# Patient Record
Sex: Male | Born: 1935 | Race: White | Hispanic: No | Smoking: Current every day smoker
Health system: Southern US, Community
[De-identification: ages and names within clinical notes are randomized; demographics above are authoritative.]

## PROBLEM LIST (undated history)

## (undated) DIAGNOSIS — K219 Gastro-esophageal reflux disease without esophagitis: Secondary | ICD-10-CM

## (undated) DIAGNOSIS — I2510 Atherosclerotic heart disease of native coronary artery without angina pectoris: Secondary | ICD-10-CM

## (undated) DIAGNOSIS — E119 Type 2 diabetes mellitus without complications: Secondary | ICD-10-CM

## (undated) DIAGNOSIS — N400 Enlarged prostate without lower urinary tract symptoms: Secondary | ICD-10-CM

## (undated) DIAGNOSIS — R0600 Dyspnea, unspecified: Secondary | ICD-10-CM

## (undated) DIAGNOSIS — C801 Malignant (primary) neoplasm, unspecified: Secondary | ICD-10-CM

## (undated) DIAGNOSIS — J189 Pneumonia, unspecified organism: Secondary | ICD-10-CM

## (undated) DIAGNOSIS — G4730 Sleep apnea, unspecified: Secondary | ICD-10-CM

## (undated) DIAGNOSIS — D649 Anemia, unspecified: Secondary | ICD-10-CM

## (undated) DIAGNOSIS — J449 Chronic obstructive pulmonary disease, unspecified: Secondary | ICD-10-CM

## (undated) DIAGNOSIS — I10 Essential (primary) hypertension: Secondary | ICD-10-CM

## (undated) DIAGNOSIS — M1990 Unspecified osteoarthritis, unspecified site: Secondary | ICD-10-CM

## (undated) DIAGNOSIS — E7800 Pure hypercholesterolemia, unspecified: Secondary | ICD-10-CM

## (undated) DIAGNOSIS — H9190 Unspecified hearing loss, unspecified ear: Secondary | ICD-10-CM

## (undated) HISTORY — PX: UVULOPALATOPHARYNGOPLASTY: SHX827

## (undated) HISTORY — PX: COLONOSCOPY: SHX174

## (undated) HISTORY — PX: HERNIA REPAIR: SHX51

## (undated) HISTORY — PX: CARDIAC CATHETERIZATION: SHX172

## (undated) HISTORY — PX: EYE SURGERY: SHX253

## (undated) HISTORY — PX: COLON SURGERY: SHX602

## (undated) HISTORY — PX: TONSILLECTOMY: SUR1361

---

## 1998-09-10 DIAGNOSIS — I2510 Atherosclerotic heart disease of native coronary artery without angina pectoris: Secondary | ICD-10-CM | POA: Diagnosis present

## 2005-06-24 ENCOUNTER — Emergency Department: Payer: Self-pay | Admitting: Emergency Medicine

## 2005-06-24 ENCOUNTER — Other Ambulatory Visit: Payer: Self-pay

## 2007-03-27 ENCOUNTER — Ambulatory Visit: Payer: Self-pay | Admitting: Gastroenterology

## 2008-11-08 ENCOUNTER — Ambulatory Visit: Payer: Self-pay | Admitting: Family Medicine

## 2009-05-19 ENCOUNTER — Ambulatory Visit: Payer: Self-pay | Admitting: Gastroenterology

## 2011-06-30 ENCOUNTER — Inpatient Hospital Stay: Payer: Self-pay | Admitting: Surgery

## 2011-07-08 LAB — PATHOLOGY REPORT

## 2011-09-06 ENCOUNTER — Ambulatory Visit: Payer: Self-pay | Admitting: Unknown Physician Specialty

## 2011-09-09 LAB — PATHOLOGY REPORT

## 2012-07-05 ENCOUNTER — Ambulatory Visit: Payer: Self-pay | Admitting: Ophthalmology

## 2012-12-06 ENCOUNTER — Ambulatory Visit: Payer: Self-pay | Admitting: Ophthalmology

## 2014-04-22 DIAGNOSIS — I10 Essential (primary) hypertension: Secondary | ICD-10-CM | POA: Insufficient documentation

## 2014-04-22 DIAGNOSIS — E785 Hyperlipidemia, unspecified: Secondary | ICD-10-CM | POA: Insufficient documentation

## 2014-04-22 DIAGNOSIS — E119 Type 2 diabetes mellitus without complications: Secondary | ICD-10-CM

## 2014-04-22 DIAGNOSIS — E1129 Type 2 diabetes mellitus with other diabetic kidney complication: Secondary | ICD-10-CM

## 2014-04-22 DIAGNOSIS — J449 Chronic obstructive pulmonary disease, unspecified: Secondary | ICD-10-CM | POA: Diagnosis present

## 2014-11-04 ENCOUNTER — Ambulatory Visit: Payer: Self-pay | Admitting: Unknown Physician Specialty

## 2015-02-03 LAB — SURGICAL PATHOLOGY

## 2016-02-08 ENCOUNTER — Observation Stay (HOSPITAL_COMMUNITY)
Admission: EM | Admit: 2016-02-08 | Discharge: 2016-02-09 | Disposition: A | Discharge status: Home / Self Care | Payer: Medicare Other | Attending: Internal Medicine | Admitting: Internal Medicine

## 2016-02-08 ENCOUNTER — Emergency Department (HOSPITAL_COMMUNITY): Payer: Medicare Other

## 2016-02-08 ENCOUNTER — Encounter (HOSPITAL_COMMUNITY): Payer: Self-pay | Admitting: Radiology

## 2016-02-08 DIAGNOSIS — K219 Gastro-esophageal reflux disease without esophagitis: Secondary | ICD-10-CM | POA: Insufficient documentation

## 2016-02-08 DIAGNOSIS — F17210 Nicotine dependence, cigarettes, uncomplicated: Secondary | ICD-10-CM | POA: Diagnosis not present

## 2016-02-08 DIAGNOSIS — N179 Acute kidney failure, unspecified: Secondary | ICD-10-CM

## 2016-02-08 DIAGNOSIS — N390 Urinary tract infection, site not specified: Secondary | ICD-10-CM | POA: Diagnosis not present

## 2016-02-08 DIAGNOSIS — Z7982 Long term (current) use of aspirin: Secondary | ICD-10-CM | POA: Insufficient documentation

## 2016-02-08 DIAGNOSIS — E785 Hyperlipidemia, unspecified: Secondary | ICD-10-CM | POA: Insufficient documentation

## 2016-02-08 DIAGNOSIS — R109 Unspecified abdominal pain: Secondary | ICD-10-CM | POA: Insufficient documentation

## 2016-02-08 DIAGNOSIS — Z720 Tobacco use: Secondary | ICD-10-CM | POA: Diagnosis present

## 2016-02-08 DIAGNOSIS — E1169 Type 2 diabetes mellitus with other specified complication: Secondary | ICD-10-CM

## 2016-02-08 DIAGNOSIS — I10 Essential (primary) hypertension: Secondary | ICD-10-CM | POA: Diagnosis present

## 2016-02-08 DIAGNOSIS — Z79899 Other long term (current) drug therapy: Secondary | ICD-10-CM | POA: Diagnosis not present

## 2016-02-08 DIAGNOSIS — Z7984 Long term (current) use of oral hypoglycemic drugs: Secondary | ICD-10-CM | POA: Diagnosis not present

## 2016-02-08 DIAGNOSIS — R4781 Slurred speech: Principal | ICD-10-CM | POA: Diagnosis present

## 2016-02-08 DIAGNOSIS — R51 Headache: Secondary | ICD-10-CM | POA: Diagnosis not present

## 2016-02-08 DIAGNOSIS — R112 Nausea with vomiting, unspecified: Secondary | ICD-10-CM | POA: Diagnosis not present

## 2016-02-08 DIAGNOSIS — E669 Obesity, unspecified: Secondary | ICD-10-CM

## 2016-02-08 DIAGNOSIS — R1030 Lower abdominal pain, unspecified: Secondary | ICD-10-CM | POA: Diagnosis present

## 2016-02-08 DIAGNOSIS — Z955 Presence of coronary angioplasty implant and graft: Secondary | ICD-10-CM | POA: Insufficient documentation

## 2016-02-08 DIAGNOSIS — I2510 Atherosclerotic heart disease of native coronary artery without angina pectoris: Secondary | ICD-10-CM | POA: Insufficient documentation

## 2016-02-08 DIAGNOSIS — E119 Type 2 diabetes mellitus without complications: Secondary | ICD-10-CM | POA: Insufficient documentation

## 2016-02-08 DIAGNOSIS — R0602 Shortness of breath: Secondary | ICD-10-CM

## 2016-02-08 HISTORY — DX: Type 2 diabetes mellitus without complications: E11.9

## 2016-02-08 HISTORY — DX: Malignant (primary) neoplasm, unspecified: C80.1

## 2016-02-08 LAB — DIFFERENTIAL
Basophils Absolute: 0 10*3/uL (ref 0.0–0.1)
Basophils Relative: 0 %
EOS PCT: 1 %
Eosinophils Absolute: 0.1 10*3/uL (ref 0.0–0.7)
LYMPHS ABS: 1.8 10*3/uL (ref 0.7–4.0)
LYMPHS PCT: 12 %
MONO ABS: 0.9 10*3/uL (ref 0.1–1.0)
Monocytes Relative: 6 %
Neutro Abs: 12 10*3/uL — ABNORMAL HIGH (ref 1.7–7.7)
Neutrophils Relative %: 81 %

## 2016-02-08 LAB — I-STAT CHEM 8, ED
BUN: 24 mg/dL — AB (ref 6–20)
CALCIUM ION: 1.07 mmol/L — AB (ref 1.13–1.30)
Chloride: 103 mmol/L (ref 101–111)
Creatinine, Ser: 1.9 mg/dL — ABNORMAL HIGH (ref 0.61–1.24)
GLUCOSE: 127 mg/dL — AB (ref 65–99)
HCT: 50 % (ref 39.0–52.0)
Hemoglobin: 17 g/dL (ref 13.0–17.0)
Potassium: 4.1 mmol/L (ref 3.5–5.1)
SODIUM: 140 mmol/L (ref 135–145)
TCO2: 22 mmol/L (ref 0–100)

## 2016-02-08 LAB — CBC
HEMATOCRIT: 45.5 % (ref 39.0–52.0)
HEMOGLOBIN: 14.8 g/dL (ref 13.0–17.0)
MCH: 30.6 pg (ref 26.0–34.0)
MCHC: 32.5 g/dL (ref 30.0–36.0)
MCV: 94 fL (ref 78.0–100.0)
Platelets: 202 10*3/uL (ref 150–400)
RBC: 4.84 MIL/uL (ref 4.22–5.81)
RDW: 13.1 % (ref 11.5–15.5)
WBC: 14.8 10*3/uL — ABNORMAL HIGH (ref 4.0–10.5)

## 2016-02-08 LAB — COMPREHENSIVE METABOLIC PANEL
ALK PHOS: 56 U/L (ref 38–126)
ALT: 16 U/L — AB (ref 17–63)
ANION GAP: 17 — AB (ref 5–15)
AST: 19 U/L (ref 15–41)
Albumin: 3.8 g/dL (ref 3.5–5.0)
BILIRUBIN TOTAL: 0.6 mg/dL (ref 0.3–1.2)
BUN: 20 mg/dL (ref 6–20)
CALCIUM: 9.3 mg/dL (ref 8.9–10.3)
CO2: 21 mmol/L — ABNORMAL LOW (ref 22–32)
CREATININE: 1.72 mg/dL — AB (ref 0.61–1.24)
Chloride: 103 mmol/L (ref 101–111)
GFR calc non Af Amer: 35 mL/min — ABNORMAL LOW (ref >60)
GFR, EST AFRICAN AMERICAN: 41 mL/min — AB (ref >60)
GLUCOSE: 136 mg/dL — AB (ref 65–99)
Potassium: 4.1 mmol/L (ref 3.5–5.1)
Sodium: 141 mmol/L (ref 135–145)
TOTAL PROTEIN: 6.4 g/dL — AB (ref 6.5–8.1)

## 2016-02-08 LAB — PROTIME-INR
INR: 1.17 (ref 0.00–1.49)
Prothrombin Time: 15.1 seconds (ref 11.6–15.2)

## 2016-02-08 LAB — I-STAT TROPONIN, ED: Troponin i, poc: 0 ng/mL (ref 0.00–0.08)

## 2016-02-08 LAB — APTT: aPTT: 30 seconds (ref 24–37)

## 2016-02-08 MED ORDER — SODIUM CHLORIDE 0.9 % IV BOLUS (SEPSIS)
500.0000 mL | Freq: Once | INTRAVENOUS | Status: AC
  Administered 2016-02-08: 500 mL via INTRAVENOUS

## 2016-02-08 MED ORDER — ONDANSETRON HCL 4 MG/2ML IJ SOLN
INTRAMUSCULAR | Status: AC
  Administered 2016-02-08: 2 mg via INTRAVENOUS
  Filled 2016-02-08: qty 2

## 2016-02-08 MED ORDER — ONDANSETRON HCL 4 MG/2ML IJ SOLN
4.0000 mg | Freq: Once | INTRAMUSCULAR | Status: AC
  Administered 2016-02-08: 2 mg via INTRAVENOUS

## 2016-02-08 NOTE — ED Notes (Signed)
PER [facility]: Patient to ED from home with stroke symptoms (dysarthria), headache, and N/V. Per EMS, patient had 2 glasses wine on empty stomach and pizza and then began having abdominal cramping, N/V and headache and dizziness - pt states "he feels funny." Patient's friend reports that pt's speech was slurred. EMS VS: 130/59, HR 72 NSR with RBBB, 94% RA, CBG 130. 18g. LAC. Patient A&O x 4.

## 2016-02-08 NOTE — Code Documentation (Signed)
Code stroke called at 2155 for this 82 y/o white male pt  Who  Was LSW at 1800 hrs.  Pt and a friend were at home drinking wine and eating pizza.  Around 2100 pt became dizzy, nauseated and had slurred speech.  EMS was summoned and pt c/o HA and vomited times 2 enroute.  CBG 130.    Pt was given Zofran 4 mg IV by EMS.  Pt arrived at [facility] at 2215 hrs, was cleared for CT by Dr Ralene Bathe at 2218, with arrival in [place] at 2219.  Pt was returned to ED 35 where he scored a 1 on the NIHSS given for mild dysarthria.  Pt continued to vomit in ED and received an additional 2 mg of Zofran.    BP 91/57 with HR 76.  NS 500 cc bolus ordered by Dr Nicole Kindred.  CT results of no acute intracranial findings were called to Dr Nicole Kindred at 2233.  Code stroke was cancelled at 2242 by Dr Nicole Kindred.  Pt to be admitted by [facility].

## 2016-02-08 NOTE — Discharge Instructions (Signed)
If you were given medicines take as directed.  If you are on coumadin or contraceptives realize their levels and effectiveness is altered by many different medicines.  If you have any reaction (rash, tongues swelling, other) to the medicines stop taking and see a physician.    If your blood pressure was elevated in the ER make sure you follow up for management with a primary doctor or return for chest pain, shortness of breath or stroke symptoms.  Please follow up as directed and return to the ER or see a physician for new or worsening symptoms.  Thank you. Filed Vitals:   02/08/16 2236 02/08/16 2242 02/08/16 2245  BP:  91/47 110/58  Pulse:  74 75  Resp:  16 13  Height:  5\' 9"  (1.753 m)   Weight:  185 lb (83.915 kg)   SpO2: 94% 92% 93%

## 2016-02-08 NOTE — ED Notes (Signed)
Code Stroke canceled. 

## 2016-02-08 NOTE — ED Provider Notes (Addendum)
CSN: [id number]     Arrival date & time 02/08/16  2215 History   First MD Initiated Contact with Patient 02/08/16 2241     Chief Complaint  Patient presents with  . Code Stroke    @EDPCLEARED @ (Consider location/radiation/quality/duration/timing/severity/associated sxs/prior Treatment) HPI Comments: 82 year old male with history of smoking, aspirin use, diabetes presents for possible stroke symptoms from home. Patient was having dinner the friend and had 2 glasses of wine slides patient feeling nauseous multiple severe episodes of vomiting and dizziness. Patient had epigastric discomfort. Patient pizza this evening. Patient had abdominal cramping that resolved. His symptoms improved significantly on route. No stroke history. No focal neuro deficits. Code stroke was called in the field.  The history is provided by the patient.    Past Medical History  Diagnosis Date  . Cancer ([place])   . Diabetes mellitus without complication [facility])    Past Surgical History  Procedure Laterality Date  . Cardiac catheterization      has a stent   No family history on file. Social History  Substance Use Topics  . Smoking status: Current Every Day Smoker -- 1.00 packs/day    Types: Cigarettes  . Smokeless tobacco: Never Used  . Alcohol Use: Yes    Review of Systems  Constitutional: Negative for fever and chills.  HENT: Negative for congestion.   Eyes: Negative for visual disturbance.  Respiratory: Negative for shortness of breath.   Cardiovascular: Negative for chest pain.  Gastrointestinal: Positive for nausea and vomiting. Negative for abdominal pain.  Genitourinary: Negative for dysuria and flank pain.  Musculoskeletal: Negative for back pain, neck pain and neck stiffness.  Skin: Negative for rash.  Neurological: Positive for dizziness. Negative for light-headedness and headaches.      Allergies  Review of patient's allergies indicates no known allergies.  Home Medications   Prior to  Admission medications   Medication Sig Start Date End Date Taking? Authorizing Provider  aspirin EC 81 MG tablet Take 81 mg by mouth daily.   Yes Historical Provider, MD  Coenzyme Q10 (COQ-10 PO) Take 1 tablet by mouth daily.   Yes Historical Provider, MD  Cyanocobalamin (VITAMIN B-12 IJ) Inject 1 Dose as directed every 14 (fourteen) days. Next dose is 02/10/16   Yes Historical Provider, MD  doxazosin (CARDURA) 4 MG tablet Take 4 mg by mouth at bedtime.   Yes Historical Provider, MD  hydrochlorothiazide (HYDRODIURIL) 25 MG tablet Take 25 mg by mouth daily.   Yes Historical Provider, MD  lisinopril (PRINIVIL,ZESTRIL) 20 MG tablet Take 20 mg by mouth 2 (two) times daily.   Yes Historical Provider, MD  metFORMIN (GLUCOPHAGE-XR) 750 MG 24 hr tablet Take 750 mg by mouth 2 (two) times daily.   Yes Historical Provider, MD  Multiple Vitamin (MULTIVITAMIN WITH MINERALS) TABS tablet Take 1 tablet by mouth daily.   Yes Historical Provider, MD  Omega-3 Fatty Acids (FISH OIL PO) Take 1 capsule by mouth daily.   Yes Historical Provider, MD  omeprazole (PRILOSEC) 20 MG capsule Take 20 mg by mouth daily.   Yes Historical Provider, MD  simvastatin (ZOCOR) 20 MG tablet Take 10 mg by mouth at bedtime.   Yes Historical Provider, MD  verapamil (CALAN-SR) 240 MG CR tablet Take 240 mg by mouth 2 (two) times daily.   Yes Historical Provider, MD   BP 110/58 mmHg  Pulse 75  Resp 13  Ht 5\' 9"  (1.753 m)  Wt 185 lb (83.915 kg)  BMI 27.31 kg/m2  SpO2 93%  Physical Exam  Constitutional: He is oriented to person, place, and time. He appears well-developed and well-nourished.  HENT:  Head: Normocephalic and atraumatic.  Dry mucous membranes  Eyes: Conjunctivae are normal. Right eye exhibits no discharge. Left eye exhibits no discharge.  Neck: Normal range of motion. Neck supple. No tracheal deviation present.  Cardiovascular: Normal rate and regular rhythm.   Pulmonary/Chest: Effort normal and breath sounds normal.   Abdominal: Soft. He exhibits no distension. There is no tenderness. There is no guarding.  Musculoskeletal: He exhibits no edema.  Neurological: He is alert and oriented to person, place, and time. GCS eye subscore is 4. GCS verbal subscore is 5. GCS motor subscore is 6.  5+ strength in UE and LE with f/e at major joints. Sensation to palpation intact in UE and LE. CNs 2-12 grossly intact.  EOMFI.  PERRL.   Finger nose and coordination intact bilateral.   Visual fields intact to finger testing. No nystagmus   Skin: Skin is warm. No rash noted.  Psychiatric: He has a normal mood and affect.  Nursing note and vitals reviewed.   ED Course  Procedures (including critical care time) Labs Review Labs Reviewed  CBC - Abnormal; Notable for the following:    WBC 14.8 (*)    All other components within normal limits  DIFFERENTIAL - Abnormal; Notable for the following:    Neutro Abs 12.0 (*)    All other components within normal limits  COMPREHENSIVE METABOLIC PANEL - Abnormal; Notable for the following:    CO2 21 (*)    Glucose, Bld 136 (*)    Creatinine, Ser 1.72 (*)    Total Protein 6.4 (*)    ALT 16 (*)    GFR calc non Af Amer 35 (*)    GFR calc Af Amer 41 (*)    Anion gap 17 (*)    All other components within normal limits  I-STAT CHEM 8, ED - Abnormal; Notable for the following:    BUN 24 (*)    Creatinine, Ser 1.90 (*)    Glucose, Bld 127 (*)    Calcium, Ion 1.07 (*)    All other components within normal limits  PROTIME-INR  APTT  I-STAT TROPOININ, ED  CBG MONITORING, ED    Imaging Review Ct Head Wo Contrast  02/08/2016  CLINICAL DATA:  Code stroke. Last seen normal at 1800 hours. Abdominal pain, headache, nausea and vomiting, and slurred speech. EXAM: CT HEAD WITHOUT CONTRAST TECHNIQUE: Contiguous axial images were obtained from the base of the skull through the vertex without intravenous contrast. COMPARISON:  None. FINDINGS: Mild cerebral atrophy. No ventricular  dilatation. Low-attenuation changes in the deep white matter consistent small vessel ischemia. 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 No mass effect or midline shift. No abnormal extra-axial fluid collections. Gray-white matter junctions are distinct. Basal cisterns are not effaced. No evidence of acute intracranial hemorrhage. No depressed skull fractures. Visualized paranasal sinuses and mastoid air cells are not opacified. IMPRESSION: No acute intracranial abnormalities. Mild chronic atrophy and small vessel ischemic changes. These results were called by telephone at the time of interpretation on 02/08/2016 at 10:33 pm to Dr. Nicole Kindred, who verbally acknowledged these results. Electronically Signed   By: Lucienne Capers M.D.   On: 02/08/2016 22:35   I have personally reviewed and evaluated these images and lab results as part of my medical decision-making.   EKG Interpretation None     EKG reviewed heart rate 79, signs of  old inferior infarction, normal QT, right bundle branch block. Sinus MDM   Final diagnoses:  Acute renal failure, unspecified acute renal failure type (HCC)  Non-intractable vomiting with nausea, vomiting of unspecified type   Patient presents as possible stroke however clinically more likely gastritis/dehydration as cause of symptoms. With age and comorbidities CT head performed no acute findings reviewed. Blood work consistent with dehydration with elevated kidney functions. IV fluid bolus and nausea meds given. Patient has minimal symptoms currently plan for observation in the ER, oral fluid challenge, reassessment and ambulation. Neurology does not feel this is neuro related, neurology recommends MRI to look for evidence of stroke. Discussed with internal medicine for observation.  The patients results and plan were reviewed and discussed.   Any x-rays performed were independently reviewed by myself.   Differential diagnosis were considered with the presenting  HPI.  Medications  ondansetron (ZOFRAN) injection 4 mg (not administered)  0.9 %  sodium chloride infusion (not administered)  ondansetron (ZOFRAN) 4 MG/2ML injection (2 mg  Given 02/08/16 2248)  sodium chloride 0.9 % bolus 500 mL (0 mLs Intravenous Stopped 02/08/16 2338)    Filed Vitals:   02/08/16 2300 02/08/16 2315 02/08/16 2330 02/09/16 0007  BP: 107/57  100/39 108/62  Pulse: 73 69 71 87  Resp: 11 15 14 20   Height:      Weight:      SpO2: 98% 97% 95% 95%    Final diagnoses:  Acute renal failure, unspecified acute renal failure type (HCC)  Non-intractable vomiting with nausea, vomiting of unspecified type    Admission/ observation were discussed with the admitting physician, patient and/or family and they are comfortable with the plan.  Elnora Morrison, MD 02/08/16 [id number]  Elnora Morrison, MD 02/09/16 [phone]

## 2016-02-08 NOTE — ED Notes (Signed)
MD at bedside. 

## 2016-02-08 NOTE — ED Notes (Signed)
Pt. Sleeping during rounds. Was awoken while this RN in the room, but right back to sleep.

## 2016-02-08 NOTE — ED Notes (Signed)
[facility] contacted to cancel Code Stroke 

## 2016-02-08 NOTE — Consult Note (Signed)
Admission H&P    Chief Complaint: Nausea, vomiting, and dizziness and headache.  HPI: George [name] is an 82 y.o. male history of hypertension, hyperlipidemia and diabetes mellitus brought to the emergency room and code stroke status following acute onset of nausea and vomiting, oral discomfort, as well as headache and tenderness. He also reportedly had dysarthria. There was no facial droop. He had no weakness or numbness of extremities. There was no associated visual change. CT scan of his head showed no acute intracranial abnormality. Patient continued to have nausea and vomiting after arriving in the emergency room. He had been given Zofran 4 mg in route to the hospital. He was given an additional 2 mg of Zofran which appeared to reduce the intensity of his nausea. Patient was eating pizza and drinking wine at the time of the onset of his symptoms. Alcohol level is pending. Exam shows slightly slurred speech but was otherwise unremarkable. Code stroke was canceled.  Past Medical History  Diagnosis Date  . Cancer ([place])   . Diabetes mellitus without complication [facility])     Past Surgical History  Procedure Laterality Date  . Cardiac catheterization      has a stent    No family history on file. Social History:  reports that he has been smoking Cigarettes.  He has been smoking about 1.00 pack per day. He has never used smokeless tobacco. He reports that he drinks alcohol. He reports that he does not use illicit drugs.  Allergies: No Known Allergies  Medications: Preadmission medications were reviewed by me.  ROS: History obtained from the patient  General ROS: negative for - chills, fatigue, fever, night sweats, weight gain or weight loss Psychological ROS: negative for - behavioral disorder, hallucinations, memory difficulties, mood swings or suicidal ideation Ophthalmic ROS: negative for - blurry vision, double vision, eye pain or loss of vision ENT ROS: negative for - epistaxis,  nasal discharge, oral lesions, sore throat, tinnitus or vertigo Allergy and Immunology ROS: negative for - hives or itchy/watery eyes Hematological and Lymphatic ROS: negative for - bleeding problems, bruising or swollen lymph nodes Endocrine ROS: negative for - galactorrhea, hair pattern changes, polydipsia/polyuria or temperature intolerance Respiratory ROS: negative for - cough, hemoptysis, shortness of breath or wheezing Cardiovascular ROS: negative for - chest pain, dyspnea on exertion, edema or irregular heartbeat Gastrointestinal ROS: negative for - abdominal pain, diarrhea, hematemesis, nausea/vomiting or stool incontinence Genito-Urinary ROS: negative for - dysuria, hematuria, incontinence or urinary frequency/urgency Musculoskeletal ROS: negative for - joint swelling or muscular weakness Neurological ROS: as noted in HPI Dermatological ROS: negative for rash and skin lesion changes  Physical Examination: Blood pressure 110/58, pulse 75, resp. rate 13, height 5\' 9"  (1.753 m), weight 83.915 kg (185 lb), SpO2 93 %.  HEENT-  Normocephalic, no lesions, without obvious abnormality.  Normal external eye and conjunctiva.  Normal TM's bilaterally.  Normal auditory canals and external ears. Normal external nose, mucus membranes and septum.  Normal pharynx. Neck supple with no masses, nodes, nodules or enlargement. Cardiovascular - regular rate and rhythm, S1, S2 normal, no murmur, click, rub or gallop Lungs - normal respiratory rate. No extra effort noted. Slight expiratory wheezing noted bilaterally. Breath sounds otherwise unremarkable. Abdomen - soft, non-tender; bowel sounds normal; no masses,  no organomegaly Extremities - no joint deformities, effusion, or inflammation  Neurologic Examination: Mental Status: Alert, oriented, thought content appropriate.  Speech fluent without evidence of aphasia. Able to follow commands without difficulty. Cranial Nerves: II-Visual fields were  normal. III/IV/VI-Pupils were equal and reacted normally to light. Extraocular movements were full and conjugate.    V/VII-no facial numbness and no facial weakness. VIII-normal. X-normal speech. XI: trapezius strength/neck flexion strength normal bilaterally XII-midline tongue extension with normal strength. Motor: 5/5 bilaterally with normal tone and bulk Sensory: Normal throughout. Deep Tendon Reflexes: 1+ and symmetric. Plantars: Mute bilaterally Cerebellar: Normal finger-to-nose testing. Carotid auscultation: Normal  Results for orders placed or performed during the hospital encounter of 02/08/16 (from the past 48 hour(s))  Protime-INR     Status: None   Collection Time: 02/08/16  9:21 PM  Result Value Ref Range   Prothrombin Time 15.1 11.6 - 15.2 seconds   INR 1.17 0.00 - 1.49  APTT     Status: None   Collection Time: 02/08/16  9:21 PM  Result Value Ref Range   aPTT 30 24 - 37 seconds  CBC     Status: Abnormal   Collection Time: 02/08/16  9:21 PM  Result Value Ref Range   WBC 14.8 (H) 4.0 - 10.5 K/uL   RBC 4.84 4.22 - 5.81 MIL/uL   Hemoglobin 14.8 13.0 - 17.0 g/dL   HCT 45.5 39.0 - 52.0 %   MCV 94.0 78.0 - 100.0 fL   MCH 30.6 26.0 - 34.0 pg   MCHC 32.5 30.0 - 36.0 g/dL   RDW 13.1 11.5 - 15.5 %   Platelets 202 150 - 400 K/uL  Differential     Status: Abnormal   Collection Time: 02/08/16  9:21 PM  Result Value Ref Range   Neutrophils Relative % 81 %   Neutro Abs 12.0 (H) 1.7 - 7.7 K/uL   Lymphocytes Relative 12 %   Lymphs Abs 1.8 0.7 - 4.0 K/uL   Monocytes Relative 6 %   Monocytes Absolute 0.9 0.1 - 1.0 K/uL   Eosinophils Relative 1 %   Eosinophils Absolute 0.1 0.0 - 0.7 K/uL   Basophils Relative 0 %   Basophils Absolute 0.0 0.0 - 0.1 K/uL  I-stat troponin, ED (not at [facility], [facility])     Status: None   Collection Time: 02/08/16 10:24 PM  Result Value Ref Range   Troponin i, poc 0.00 0.00 - 0.08 ng/mL   Comment 3            Comment: Due to the release kinetics of  cTnI, a negative result within the first hours of the onset of symptoms does not rule out myocardial infarction with certainty. If myocardial infarction is still suspected, repeat the test at appropriate intervals.   I-Stat Chem 8, ED  (not at [facility], [facility])     Status: Abnormal   Collection Time: 02/08/16 10:27 PM  Result Value Ref Range   Sodium 140 135 - 145 mmol/L   Potassium 4.1 3.5 - 5.1 mmol/L   Chloride 103 101 - 111 mmol/L   BUN 24 (H) 6 - 20 mg/dL   Creatinine, Ser 1.90 (H) 0.61 - 1.24 mg/dL   Glucose, Bld 127 (H) 65 - 99 mg/dL   Calcium, Ion 1.07 (L) 1.13 - 1.30 mmol/L   TCO2 22 0 - 100 mmol/L   Hemoglobin 17.0 13.0 - 17.0 g/dL   HCT 50.0 39.0 - 52.0 %   Ct Head Wo Contrast  02/08/2016  CLINICAL DATA:  Code stroke. Last seen normal at 1800 hours. Abdominal pain, headache, nausea and vomiting, and slurred speech. EXAM: CT HEAD WITHOUT CONTRAST TECHNIQUE: Contiguous axial images were obtained from the base of the skull through the  vertex without intravenous contrast. COMPARISON:  None. FINDINGS: Mild cerebral atrophy. No ventricular dilatation. Low-attenuation changes in the deep white matter consistent small vessel ischemia. 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 No mass effect or midline shift. No abnormal extra-axial fluid collections. Gray-white matter junctions are distinct. Basal cisterns are not effaced. No evidence of acute intracranial hemorrhage. No depressed skull fractures. Visualized paranasal sinuses and mastoid air cells are not opacified. IMPRESSION: No acute intracranial abnormalities. Mild chronic atrophy and small vessel ischemic changes. These results were called by telephone at the time of interpretation on 02/08/2016 at 10:33 pm to Dr. Nicole Kindred, who verbally acknowledged these results. Electronically Signed   By: Lucienne Capers M.D.   On: 02/08/2016 22:35    Assessment/Plan 82 year old man presenting with acute nausea and vomiting and slightly slurred  speech. Clinical exam and CT showed no signs of an acute vascular event. Stroke is unlikely.  Recommendations: 1. MRI of the brain without contrast 2. If cerebral infarction is demonstrated, workup to include risk assessment 3. No further neurological intervention indicated and if MRI study of the brain is unremarkable. 4. Further evaluation etiology of nausea and vomiting per the physician and admitting team if patient is admitted.  C.R. Nicole Kindred, [place] [facility] [phone]  02/08/2016, 10:54 PM

## 2016-02-09 ENCOUNTER — Observation Stay (HOSPITAL_COMMUNITY): Payer: Medicare Other

## 2016-02-09 ENCOUNTER — Encounter (HOSPITAL_COMMUNITY): Payer: Self-pay | Admitting: *Deleted

## 2016-02-09 DIAGNOSIS — K219 Gastro-esophageal reflux disease without esophagitis: Secondary | ICD-10-CM | POA: Diagnosis not present

## 2016-02-09 DIAGNOSIS — R1030 Lower abdominal pain, unspecified: Secondary | ICD-10-CM | POA: Diagnosis present

## 2016-02-09 DIAGNOSIS — R112 Nausea with vomiting, unspecified: Secondary | ICD-10-CM | POA: Diagnosis present

## 2016-02-09 DIAGNOSIS — I10 Essential (primary) hypertension: Secondary | ICD-10-CM | POA: Diagnosis not present

## 2016-02-09 DIAGNOSIS — E119 Type 2 diabetes mellitus without complications: Secondary | ICD-10-CM

## 2016-02-09 DIAGNOSIS — E1169 Type 2 diabetes mellitus with other specified complication: Secondary | ICD-10-CM

## 2016-02-09 DIAGNOSIS — R1110 Vomiting, unspecified: Secondary | ICD-10-CM | POA: Insufficient documentation

## 2016-02-09 DIAGNOSIS — N390 Urinary tract infection, site not specified: Secondary | ICD-10-CM | POA: Diagnosis present

## 2016-02-09 DIAGNOSIS — N179 Acute kidney failure, unspecified: Secondary | ICD-10-CM

## 2016-02-09 DIAGNOSIS — E669 Obesity, unspecified: Secondary | ICD-10-CM

## 2016-02-09 DIAGNOSIS — R4781 Slurred speech: Secondary | ICD-10-CM | POA: Diagnosis present

## 2016-02-09 DIAGNOSIS — Z720 Tobacco use: Secondary | ICD-10-CM

## 2016-02-09 LAB — URINALYSIS, ROUTINE W REFLEX MICROSCOPIC
BILIRUBIN URINE: NEGATIVE
Glucose, UA: NEGATIVE mg/dL
Hgb urine dipstick: NEGATIVE
Ketones, ur: NEGATIVE mg/dL
NITRITE: NEGATIVE
PROTEIN: NEGATIVE mg/dL
SPECIFIC GRAVITY, URINE: 1.018 (ref 1.005–1.030)
pH: 5 (ref 5.0–8.0)

## 2016-02-09 LAB — BASIC METABOLIC PANEL
ANION GAP: 9 (ref 5–15)
BUN: 23 mg/dL — AB (ref 6–20)
CHLORIDE: 109 mmol/L (ref 101–111)
CO2: 24 mmol/L (ref 22–32)
Calcium: 8.6 mg/dL — ABNORMAL LOW (ref 8.9–10.3)
Creatinine, Ser: 1.86 mg/dL — ABNORMAL HIGH (ref 0.61–1.24)
GFR, EST AFRICAN AMERICAN: 37 mL/min — AB (ref >60)
GFR, EST NON AFRICAN AMERICAN: 32 mL/min — AB (ref >60)
Glucose, Bld: 95 mg/dL (ref 65–99)
POTASSIUM: 4.8 mmol/L (ref 3.5–5.1)
SODIUM: 142 mmol/L (ref 135–145)

## 2016-02-09 LAB — LIPID PANEL
Cholesterol: 89 mg/dL (ref 0–200)
HDL: 30 mg/dL — ABNORMAL LOW (ref >40)
LDL Cholesterol: 37 mg/dL (ref 0–99)
Total CHOL/HDL Ratio: 3 RATIO
Triglycerides: 110 mg/dL (ref <150)
VLDL: 22 mg/dL (ref 0–40)

## 2016-02-09 LAB — URINE MICROSCOPIC-ADD ON

## 2016-02-09 LAB — CBC WITH DIFFERENTIAL/PLATELET
BASOS ABS: 0 10*3/uL (ref 0.0–0.1)
Basophils Relative: 0 %
EOS ABS: 0 10*3/uL (ref 0.0–0.7)
Eosinophils Relative: 0 %
HCT: 40.3 % (ref 39.0–52.0)
HEMOGLOBIN: 13.3 g/dL (ref 13.0–17.0)
LYMPHS ABS: 1.2 10*3/uL (ref 0.7–4.0)
Lymphocytes Relative: 10 %
MCH: 31.1 pg (ref 26.0–34.0)
MCHC: 33 g/dL (ref 30.0–36.0)
MCV: 94.4 fL (ref 78.0–100.0)
Monocytes Absolute: 0.7 10*3/uL (ref 0.1–1.0)
Monocytes Relative: 5 %
NEUTROS PCT: 85 %
Neutro Abs: 11 10*3/uL — ABNORMAL HIGH (ref 1.7–7.7)
PLATELETS: 188 10*3/uL (ref 150–400)
RBC: 4.27 MIL/uL (ref 4.22–5.81)
RDW: 13.3 % (ref 11.5–15.5)
WBC: 13 10*3/uL — AB (ref 4.0–10.5)

## 2016-02-09 LAB — ETHANOL: Alcohol, Ethyl (B): <5 mg/dL (ref <5)

## 2016-02-09 LAB — LACTIC ACID, PLASMA: LACTIC ACID, VENOUS: 1.8 mmol/L (ref 0.5–2.0)

## 2016-02-09 MED ORDER — CEPHALEXIN 250 MG PO CAPS
500.0000 mg | ORAL_CAPSULE | Freq: Two times a day (BID) | ORAL | Status: DC
  Administered 2016-02-09: 500 mg via ORAL
  Filled 2016-02-09: qty 2

## 2016-02-09 MED ORDER — SODIUM CHLORIDE 0.9 % IV SOLN
INTRAVENOUS | Status: AC
  Administered 2016-02-09: 02:00:00 via INTRAVENOUS

## 2016-02-09 MED ORDER — SODIUM CHLORIDE 0.9 % IV SOLN: INTRAVENOUS | Status: DC

## 2016-02-09 MED ORDER — VERAPAMIL HCL ER 240 MG PO TBCR
240.0000 mg | EXTENDED_RELEASE_TABLET | Freq: Two times a day (BID) | ORAL | Status: DC
  Administered 2016-02-09: 240 mg via ORAL
  Filled 2016-02-09 (×3): qty 1

## 2016-02-09 MED ORDER — CEPHALEXIN 500 MG PO CAPS: 500.0000 mg | ORAL_CAPSULE | Freq: Two times a day (BID) | ORAL | Status: AC

## 2016-02-09 MED ORDER — SIMVASTATIN 10 MG PO TABS
10.0000 mg | ORAL_TABLET | Freq: Every day | ORAL | Status: DC
  Filled 2016-02-09: qty 1

## 2016-02-09 MED ORDER — CEPHALEXIN 500 MG PO CAPS: 500.0000 mg | ORAL_CAPSULE | Freq: Two times a day (BID) | ORAL | Status: DC

## 2016-02-09 MED ORDER — PANTOPRAZOLE SODIUM 40 MG PO TBEC
40.0000 mg | DELAYED_RELEASE_TABLET | Freq: Every day | ORAL | Status: DC
  Administered 2016-02-09: 40 mg via ORAL
  Filled 2016-02-09: qty 1

## 2016-02-09 MED ORDER — ASPIRIN EC 81 MG PO TBEC
81.0000 mg | DELAYED_RELEASE_TABLET | Freq: Every day | ORAL | Status: DC
  Administered 2016-02-09: 81 mg via ORAL
  Filled 2016-02-09: qty 1

## 2016-02-09 MED ORDER — OMEGA-3-ACID ETHYL ESTERS 1 G PO CAPS
1.0000 g | ORAL_CAPSULE | Freq: Every day | ORAL | Status: DC
  Administered 2016-02-09: 1 g via ORAL
  Filled 2016-02-09: qty 1

## 2016-02-09 MED ORDER — IPRATROPIUM-ALBUTEROL 0.5-2.5 (3) MG/3ML IN SOLN
3.0000 mL | Freq: Once | RESPIRATORY_TRACT | Status: AC
  Administered 2016-02-09: 3 mL via RESPIRATORY_TRACT
  Filled 2016-02-09: qty 3

## 2016-02-09 MED ORDER — IPRATROPIUM-ALBUTEROL 0.5-2.5 (3) MG/3ML IN SOLN: 3.0000 mL | RESPIRATORY_TRACT | Status: DC | PRN

## 2016-02-09 MED ORDER — DOXAZOSIN MESYLATE 4 MG PO TABS
4.0000 mg | ORAL_TABLET | Freq: Every day | ORAL | Status: DC
  Filled 2016-02-09: qty 1

## 2016-02-09 MED ORDER — STROKE: EARLY STAGES OF RECOVERY BOOK
Freq: Once | Status: AC
Start: 2016-02-09 — End: 2016-02-09
  Administered 2016-02-09: 16:00:00
  Filled 2016-02-09 (×2): qty 1

## 2016-02-09 MED ORDER — ENOXAPARIN SODIUM 30 MG/0.3ML ~~LOC~~ SOLN
30.0000 mg | Freq: Every day | SUBCUTANEOUS | Status: DC
  Filled 2016-02-09: qty 0.3

## 2016-02-09 MED ORDER — ADULT MULTIVITAMIN W/MINERALS CH
1.0000 | ORAL_TABLET | Freq: Every day | ORAL | Status: DC
  Administered 2016-02-09: 1 via ORAL
  Filled 2016-02-09: qty 1

## 2016-02-09 NOTE — ED Notes (Signed)
Admitting MD paged re: plan of care

## 2016-02-09 NOTE — H&P (Addendum)
History and Physical    George [name] [id number] DOB: [birth year] DOA: 02/08/2016  Referring MD/NP/PA: Dr. Steffanie Dunn  PCP: No primary care provider on file.  Outpatient Specialists: -- Patient coming from: From a restaurant via ems  Chief Complaint: Slurred speech  HPI: George [name] is a 82 y.o. male with medical history significant of DM, CAD s/p PCI,  tobacco abuse, Gerd, HLD, HTN; who presents after having episode of lower abdominal pain with nausea and vomiting. Patient reports being out with friends tonight at a restaurant when she was eating pizza and reportedly has only had 2 glasses of wine. He reports having acute onset of lower abdominal pain that was sharp and caused him to be nauseated and vomit. He was noted to have some slurred speech at that time. No reported visual changes, facial droop, focal weakness, or numbness in the extremities. Patient denies any loss of consciousness. He notes that he's had this in the past anywhere from 3-5 years ago which she had to go to [facility] as they thought he had a stomach ulcer. He underwent EGD and all these tests which showed no acute abnormality. Patient notes a history of smoking 1 pack of cigarettes per day for the last since age 59.   ED Course: Upon admission into the emergency department patient was evaluated and seen to be slightly hypotensive with a blood pressure of 91/47, but all other vital signs within normal limits. Temperature was not available. Code stroke was initiated and CT of the brain contrast showed no acute abnormalities. Neurology recommended MRI. Initial lab work revealed a WBC 14.8, BUN 24, creatinine 1.9, and negative troponin.  He was given a half a liter of IV fluids and placed on a rate of 75 mL per hour.   Review of Systems: As per HPI otherwise 10 point review of systems negative.    Past Medical History  Diagnosis Date  . Cancer ([place])   . Diabetes mellitus without complication [facility])     Past Surgical  History  Procedure Laterality Date  . Cardiac catheterization      has a stent     reports that he has been smoking Cigarettes.  He has been smoking about 1.00 pack per day. He has never used smokeless tobacco. He reports that he drinks alcohol. He reports that he does not use illicit drugs.  No Known Allergies  No family history on file.  Prior to Admission medications   Medication Sig Start Date End Date Taking? Authorizing Provider  aspirin EC 81 MG tablet Take 81 mg by mouth daily.   Yes Historical Provider, MD  Coenzyme Q10 (COQ-10 PO) Take 1 tablet by mouth daily.   Yes Historical Provider, MD  Cyanocobalamin (VITAMIN B-12 IJ) Inject 1 Dose as directed every 14 (fourteen) days. Next dose is 02/10/16   Yes Historical Provider, MD  doxazosin (CARDURA) 4 MG tablet Take 4 mg by mouth at bedtime.   Yes Historical Provider, MD  hydrochlorothiazide (HYDRODIURIL) 25 MG tablet Take 25 mg by mouth daily.   Yes Historical Provider, MD  lisinopril (PRINIVIL,ZESTRIL) 20 MG tablet Take 20 mg by mouth 2 (two) times daily.   Yes Historical Provider, MD  metFORMIN (GLUCOPHAGE-XR) 750 MG 24 hr tablet Take 750 mg by mouth 2 (two) times daily.   Yes Historical Provider, MD  Multiple Vitamin (MULTIVITAMIN WITH MINERALS) TABS tablet Take 1 tablet by mouth daily.   Yes Historical Provider, MD  Omega-3 Fatty Acids (FISH OIL PO) Take  1 capsule by mouth daily.   Yes Historical Provider, MD  omeprazole (PRILOSEC) 20 MG capsule Take 20 mg by mouth daily.   Yes Historical Provider, MD  simvastatin (ZOCOR) 20 MG tablet Take 10 mg by mouth at bedtime.   Yes Historical Provider, MD  verapamil (CALAN-SR) 240 MG CR tablet Take 240 mg by mouth 2 (two) times daily.   Yes Historical Provider, MD    Physical Exam: Filed Vitals:   02/08/16 2300 02/08/16 2315 02/08/16 2330 02/09/16 0007  BP: 107/57  100/39 108/62  Pulse: 73 69 71 87  Resp: 11 15 14 20   Height:      Weight:      SpO2: 98% 97% 95% 95%       Constitutional: NAD, calm, comfortable Filed Vitals:   02/08/16 2300 02/08/16 2315 02/08/16 2330 02/09/16 0007  BP: 107/57  100/39 108/62  Pulse: 73 69 71 87  Resp: 11 15 14 20   Height:      Weight:      SpO2: 98% 97% 95% 95%   Eyes: PERRL, lids and conjunctivae normal ENMT: Mucous membranes are moist. Posterior pharynx clear of any exudate or lesions.Normal dentition.  Neck: normal, supple, no masses, no thyromegaly Respiratory:  Decreased overall aeration. Bilateral wheezing appreciated worse on the right as opposed to left lung field Cardiovascular: Regular rate and rhythm, no murmurs / rubs / gallops. No extremity edema. 2+ pedal pulses. No carotid bruits.  Abdomen: no tenderness, no masses palpated. No hepatosplenomegaly. Bowel sounds positive.  Musculoskeletal: no clubbing / cyanosis. No joint deformity upper and lower extremities. Good ROM, no contractures. Normal muscle tone.  Skin: no rashes, lesions, ulcers. No induration Neurologic: CN 2-12 grossly intact. Sensation intact, DTR normal. Strength 5/5 in all 4.  Psychiatric: Normal judgment and insight. Alert and oriented x 3. Normal mood.    Labs on Admission: I have personally reviewed following labs and imaging studies  CBC:  Recent Labs Lab 02/08/16 2121 02/08/16 2227  WBC 14.8*  --   NEUTROABS 12.0*  --   HGB 14.8 17.0  HCT 45.5 50.0  MCV 94.0  --   PLT 202  --    Basic Metabolic Panel:  Recent Labs Lab 02/08/16 2121 02/08/16 2227  NA 141 140  K 4.1 4.1  CL 103 103  CO2 21*  --   GLUCOSE 136* 127*  BUN 20 24*  CREATININE 1.72* 1.90*  CALCIUM 9.3  --    GFR: Estimated Creatinine Clearance: 30 mL/min (by C-G formula based on Cr of 1.9). Liver Function Tests:  Recent Labs Lab 02/08/16 2121  AST 19  ALT 16*  ALKPHOS 56  BILITOT 0.6  PROT 6.4*  ALBUMIN 3.8   No results for input(s): LIPASE, AMYLASE in the last 168 hours. No results for input(s): AMMONIA in the last 168  hours. Coagulation Profile:  Recent Labs Lab 02/08/16 2121  INR 1.17   Cardiac Enzymes: No results for input(s): CKTOTAL, CKMB, CKMBINDEX, TROPONINI in the last 168 hours. BNP (last 3 results) No results for input(s): PROBNP in the last 8760 hours. HbA1C: No results for input(s): HGBA1C in the last 72 hours. CBG: No results for input(s): GLUCAP in the last 168 hours. Lipid Profile: No results for input(s): CHOL, HDL, LDLCALC, TRIG, CHOLHDL, LDLDIRECT in the last 72 hours. Thyroid Function Tests: No results for input(s): TSH, T4TOTAL, FREET4, T3FREE, THYROIDAB in the last 72 hours. Anemia Panel: No results for input(s): VITAMINB12, FOLATE, FERRITIN, TIBC, IRON, RETICCTPCT in  the last 72 hours. Urine analysis: No results found for: COLORURINE, APPEARANCEUR, LABSPEC, PHURINE, GLUCOSEU, HGBUR, BILIRUBINUR, KETONESUR, PROTEINUR, UROBILINOGEN, NITRITE, LEUKOCYTESUR Sepsis Labs: @LABRCNTIP (procalcitonin:4,lacticidven:4) )No results found for this or any previous visit (from the past 240 hour(s)).   Radiological Exams on Admission: Ct Head Wo Contrast  02/08/2016  CLINICAL DATA:  Code stroke. Last seen normal at 1800 hours. Abdominal pain, headache, nausea and vomiting, and slurred speech. EXAM: CT HEAD WITHOUT CONTRAST TECHNIQUE: Contiguous axial images were obtained from the base of the skull through the vertex without intravenous contrast. COMPARISON:  None. FINDINGS: Mild cerebral atrophy. No ventricular dilatation. Low-attenuation changes in the deep white matter consistent small vessel ischemia. 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 No mass effect or midline shift. No abnormal extra-axial fluid collections. Gray-white matter junctions are distinct. Basal cisterns are not effaced. No evidence of acute intracranial hemorrhage. No depressed skull fractures. Visualized paranasal sinuses and mastoid air cells are not opacified. IMPRESSION: No acute intracranial abnormalities. Mild  chronic atrophy and small vessel ischemic changes. These results were called by telephone at the time of interpretation on 02/08/2016 at 10:33 pm to Dr. Nicole Kindred, who verbally acknowledged these results. Electronically Signed   By: Lucienne Capers M.D.   On: 02/08/2016 22:35      Assessment/Plan Slurred speech: Now resolved. Could have been secondary to acute intoxication. Initial CT scan of the brain showed no acute abnormalities. - Admit to a telemetry bed - Check MRI of the brain. No of further workup needed if within normal limits - Thank you Neurology for consultation  Abdominal pain, Nausea, and vomiting: Now resolved. With history given suspect symptoms could likely be secondary to a transient obstruction /volvulus versus gastroenteritis/gastritis versus acute intoxication. - Check alcohol level - Zofran prn  Leukocytosis: WBC elevated at 14.8. We'll need to check for other possible sources of infection, but this could likely be reactive. - repeat CBC in the - Check UA and CXR    Acute kidney injury: Suspect patient dehydrated with elevated BUN to creatinine ratio.  BUN 24 and creatinine of 1.9. Patient given half a liter of IV fluids in the ED. - Normal Saline IV fluids seen above - Repeat BMP in a.m. - Avoid nephrotoxic agents - Question need for medication adjustments  Tobacco abuse with suspected COPD : Patient smokes a pack of cigarettes per day and has done so since the age of 32. Patient found to be diffusely wheezing on exam.  - Nasal cannula oxygen to keep O2 sats greater than 92% - DuoNeb's prn SOB or wheezing  - Patient may benefit from albuterol inhaler at discharge and outpatient workup with PFTs  Essential hypertension  - Held lisinopril and hydrochlorothiazide - Continued doxazosin  Hyperlipidemia  - Checking lipid panel - Continue simvastatin, omega-3 fatty acids  Coronary artery disease s/p PCI - Continue aspirin   Diabetes mellitus type 2 - checking  hemoglobin A1c - Held metformin  GERD - Pharmacy substitution of Protonix for omeprazole    DVT prophylaxis:  lovenox Code Status: Full Family Communication: None Disposition Plan: Possible discharge tomorrow if negative work-up Consults called: Dr. Nicole Kindred Neurology Admission status:  Telemetry observation    Norval Morton MD [facility] Pager [phone]  If 7PM-7AM, please contact night-coverage [email or web address] Password [email or web address]  02/09/2016, 12:17 AM

## 2016-02-09 NOTE — ED Notes (Signed)
George [name], [phone], called pt gave permission to speak with him re: plan of care, pts friend states, "His car is at my house so when he is discharged you will need to call me so I can pick him up."

## 2016-02-09 NOTE — ED Notes (Signed)
In room to hook patient back on monitor.  Pt noted to have brown satchel on a cord around his neck.  Inquired what that was and patient states that's my money about $3500.  Encouraged patient to allow me to place in safe.  Refused.  States you'll get my money when you cut my head off.

## 2016-02-09 NOTE — ED Notes (Signed)
Carb modified, heart healthy breakfast tray ordered

## 2016-02-09 NOTE — ED Notes (Signed)
Lunch tray ordered 

## 2016-02-09 NOTE — Discharge Summary (Addendum)
Physician Discharge Summary  George [name] [id number] DOB: 11-Jun-1932 DOA: 02/08/2016  PCP: George [name] , MD Admit date: 02/08/2016 Discharge date: 02/09/2016  Time spent: 25 minutes  Recommendations for Outpatient Follow-up:  Discharge home with outpatient PCP follow-up in one week. Reports having appt tomorrow am. Follow urine culture results.   Discharge Diagnoses:  Principal Problem:   Slurred speech  Active Problems:   UTI   Acute versus acute on chronic kidney injury   Nausea and vomiting   Lower abdominal pain   Tobacco abuse   Essential hypertension   Diabetes mellitus type 2 in obese (HCC)   GERD (gastroesophageal reflux disease)    Discharge Condition: Fair  Diet recommendation: Diabetic  Filed Weights   02/08/16 2242  Weight: 83.915 kg (185 lb)    History of present illness:  82 year old male with history of diverticulitis, CAD status post PCI, tobacco abuse, GERD, hyperlipidemia, hypertension presented to the ED with lower abdominal pain associated with nausea and vomiting. He was out with friends at a restaurant, eating pizza and also hypoglossal 1. He then had acute onset of lower abdominal pain associated with nausea and vomiting. He was found to have some slurred speech but denied any visual changes, facial droop, numbness or focal weakness. No loss of consciousness. In the ED his blood pressure was soft but otherwise vitals normal. "Stroke was called and CT head done which was unremarkable. Neurology consulted recommended MRI of the brain. Workup showed a sugar 14.8 K, P of 24, creatinine 1.9, normal other electrolytes and negative troponin. He was given IV normal saline bolus and placed on maintenance fluid. UA was suggestive of UTI.   Hospital Course:  Slurred speech Suspect this is in the setting of alcohol use and acute GI symptoms. Resolved upon coming to the ED. Code stroke was called. Head CT and MRI brain negative for acute findings. No  further recommendations per neurology. Has been stable on telemetry. Serial troponin negative.  Lower abdominal pain with nausea and vomiting associated with UTI No further symptoms. Labs including LFTs unremarkable. Alcohol level undetectable. Lactic acid normal. UA suggestive of UTI. Also reported some urinary discomfort for the past 1 week. Culture sent. Will place him on oral Keflex for a seven-day course.  Acute versus acute on chronic kidney injury No prior baseline in the system. Patient is on metformin, HCTZ and lisinopril and I have discontinued all of them. Should follow-up with PCP in 1 week and have his renal function checked. Renal function improving with IV fluids.  Diabetes mellitus Metformin held due to acute kidney injury. Follow-up with PCP  GERD Continue PPI  CAD History of PCI Continue aspirin and statin.  No further symptoms. Patient independent in a stable. Can be discharged home with outpatient follow-up  Procedures:  CT head  MRI brain    Consultations:  Neurology  Discharge Exam: Filed Vitals:   02/09/16 1330 02/09/16 1400  BP: 144/53 123/66  Pulse: 79 80  Temp:    Resp: 48 17    General:Elderly male not in distress HEENT: No pallor, moist mucosa, supple neck Chest: Clear bilaterally  CVS: Normal S1 and S2, no murmurs or gallop GI: Soft, nondistended, nontender, bowel sounds present Musculoskeletal: Warm, no edema CNS: Alert and oriented  Discharge Instructions    Current Discharge Medication List    START taking these medications   Details  cephALEXin (KEFLEX) 500 MG capsule Take 1 capsule (500 mg total) by mouth every 12 (twelve) hours. Qty:  14 capsule, Refills: 0      CONTINUE these medications which have NOT CHANGED   Details  aspirin EC 81 MG tablet Take 81 mg by mouth daily.    Coenzyme Q10 (COQ-10 PO) Take 1 tablet by mouth daily.    Cyanocobalamin (VITAMIN B-12 IJ) Inject 1 Dose as directed every 14 (fourteen)  days. Next dose is 02/10/16    doxazosin (CARDURA) 4 MG tablet Take 4 mg by mouth at bedtime.    Multiple Vitamin (MULTIVITAMIN WITH MINERALS) TABS tablet Take 1 tablet by mouth daily.    Omega-3 Fatty Acids (FISH OIL PO) Take 1 capsule by mouth daily.    omeprazole (PRILOSEC) 20 MG capsule Take 20 mg by mouth daily.    simvastatin (ZOCOR) 20 MG tablet Take 10 mg by mouth at bedtime.    verapamil (CALAN-SR) 240 MG CR tablet Take 240 mg by mouth 2 (two) times daily.      STOP taking these medications     hydrochlorothiazide (HYDRODIURIL) 25 MG tablet      lisinopril (PRINIVIL,ZESTRIL) 20 MG tablet      metFORMIN (GLUCOPHAGE-XR) 750 MG 24 hr tablet        No Known Allergies Follow-up Information    Follow up with [facility]. Call in 2 days.   Contact information:   [place] [id number] [phone]        The results of significant diagnostics from this hospitalization (including imaging, microbiology, ancillary and laboratory) are listed below for reference.    Significant Diagnostic Studies: Ct Head Wo Contrast  02/08/2016  CLINICAL DATA:  Code stroke. Last seen normal at 1800 hours. Abdominal pain, headache, nausea and vomiting, and slurred speech. EXAM: CT HEAD WITHOUT CONTRAST TECHNIQUE: Contiguous axial images were obtained from the base of the skull through the vertex without intravenous contrast. COMPARISON:  None. FINDINGS: Mild cerebral atrophy. No ventricular dilatation. Low-attenuation changes in the deep white matter consistent small vessel ischemia. 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 No mass effect or midline shift. No abnormal extra-axial fluid collections. Gray-white matter junctions are distinct. Basal cisterns are not effaced. No evidence of acute intracranial hemorrhage. No depressed skull fractures. Visualized paranasal sinuses and mastoid air cells are not opacified. IMPRESSION: No acute intracranial  abnormalities. Mild chronic atrophy and small vessel ischemic changes. These results were called by telephone at the time of interpretation on 02/08/2016 at 10:33 pm to Dr. Nicole Kindred, who verbally acknowledged these results. Electronically Signed   By: Lucienne Capers M.D.   On: 02/08/2016 22:35   Mr Brain Wo Contrast  02/09/2016  CLINICAL DATA:  Headache, slurred speech. Abdominal pain, nausea and vomiting. History of diabetes. EXAM: MRI HEAD WITHOUT CONTRAST MRA HEAD WITHOUT CONTRAST TECHNIQUE: Multiplanar, multiecho pulse sequences of the brain and surrounding structures were obtained without intravenous contrast. Angiographic images of the head were obtained using MRA technique without contrast. COMPARISON:  CT HEAD February 08, 2016 FINDINGS: MRI HEAD FINDINGS INTRACRANIAL CONTENTS: No reduced diffusion to suggest acute ischemia. No susceptibility artifact to suggest hemorrhage. The ventricles and sulci are normal for patient's age. No abnormal parenchymal signal, mass lesions, mass effect. No abnormal extra-axial fluid collections. Small crescentic arachnoid cyst LEFT sylvian fissure/middle cranial fossa. Small RIGHT basal ganglia perivascular space. No extra-axial masses though, contrast enhanced sequences would be more sensitive. Normal major intracranial vascular flow voids present at skull base. ORBITS: The included ocular globes and orbital contents are non-suspicious. Status post  bilateral ocular lens implants. SINUSES: The mastoid air-cells and included paranasal sinuses are well-aerated. SKULL/SOFT TISSUES: No abnormal sellar expansion. No suspicious calvarial bone marrow signal. Craniocervical junction maintained. MRA HEAD FINDINGS Anterior circulation: Normal flow related enhancement of the included cervical, petrous, cavernous and supraclinoid internal carotid arteries. Patent anterior communicating artery. Normal flow related enhancement of the anterior and middle cerebral arteries, including distal  segments. Mild stenosis proximal LEFT M2 superior segment with faint linear filling defect. No large vessel occlusion, high-grade stenosis, abnormal luminal irregularity, aneurysm. Posterior circulation: LEFT vertebral artery is dominant. Basilar artery is patent, with normal flow related enhancement of the main branch vessels. Fetal origin RIGHT posterior cerebral artery. Normal flow related enhancement of the posterior cerebral arteries. No large vessel occlusion, high-grade stenosis, abnormal luminal irregularity, aneurysm. IMPRESSION: Negative MRI of the head for age. Mild stenosis proximal LEFT M2 segment with with faint linear filling defect suggesting prior thromboembolic event. No emergent large vessel occlusion or high-grade stenosis. Electronically Signed   By: Elon Alas M.D.   On: 02/09/2016 04:18   Dg Chest Port 1 View  02/09/2016  CLINICAL DATA:  Dyspnea, onset tonight EXAM: PORTABLE CHEST 1 VIEW COMPARISON:  None. FINDINGS: A single AP portable view of the chest demonstrates no focal airspace consolidation or alveolar edema. The lungs are grossly clear. There is no large effusion or pneumothorax. Cardiac and mediastinal contours appear unremarkable. IMPRESSION: No active disease. Electronically Signed   By: Andreas Newport M.D.   On: 02/09/2016 01:57   Mr Jodene Nam Head/brain Wo Cm  02/09/2016  CLINICAL DATA:  Headache, slurred speech. Abdominal pain, nausea and vomiting. History of diabetes. EXAM: MRI HEAD WITHOUT CONTRAST MRA HEAD WITHOUT CONTRAST TECHNIQUE: Multiplanar, multiecho pulse sequences of the brain and surrounding structures were obtained without intravenous contrast. Angiographic images of the head were obtained using MRA technique without contrast. COMPARISON:  CT HEAD February 08, 2016 FINDINGS: MRI HEAD FINDINGS INTRACRANIAL CONTENTS: No reduced diffusion to suggest acute ischemia. No susceptibility artifact to suggest hemorrhage. The ventricles and sulci are normal for patient's  age. No abnormal parenchymal signal, mass lesions, mass effect. No abnormal extra-axial fluid collections. Small crescentic arachnoid cyst LEFT sylvian fissure/middle cranial fossa. Small RIGHT basal ganglia perivascular space. No extra-axial masses though, contrast enhanced sequences would be more sensitive. Normal major intracranial vascular flow voids present at skull base. ORBITS: The included ocular globes and orbital contents are non-suspicious. Status post bilateral ocular lens implants. SINUSES: The mastoid air-cells and included paranasal sinuses are well-aerated. SKULL/SOFT TISSUES: No abnormal sellar expansion. No suspicious calvarial bone marrow signal. Craniocervical junction maintained. MRA HEAD FINDINGS Anterior circulation: Normal flow related enhancement of the included cervical, petrous, cavernous and supraclinoid internal carotid arteries. Patent anterior communicating artery. Normal flow related enhancement of the anterior and middle cerebral arteries, including distal segments. Mild stenosis proximal LEFT M2 superior segment with faint linear filling defect. No large vessel occlusion, high-grade stenosis, abnormal luminal irregularity, aneurysm. Posterior circulation: LEFT vertebral artery is dominant. Basilar artery is patent, with normal flow related enhancement of the main branch vessels. Fetal origin RIGHT posterior cerebral artery. Normal flow related enhancement of the posterior cerebral arteries. No large vessel occlusion, high-grade stenosis, abnormal luminal irregularity, aneurysm. IMPRESSION: Negative MRI of the head for age. Mild stenosis proximal LEFT M2 segment with with faint linear filling defect suggesting prior thromboembolic event. No emergent large vessel occlusion or high-grade stenosis. Electronically Signed   By: Elon Alas M.D.   On: 02/09/2016 04:18  Microbiology: No results found for this or any previous visit (from the past 240 hour(s)).   Labs: Basic  Metabolic Panel:  Recent Labs Lab 02/08/16 2121 02/08/16 2227 02/09/16 1005  NA 141 140 142  K 4.1 4.1 4.8  CL 103 103 109  CO2 21*  --  24  GLUCOSE 136* 127* 95  BUN 20 24* 23*  CREATININE 1.72* 1.90* 1.86*  CALCIUM 9.3  --  8.6*   Liver Function Tests:  Recent Labs Lab 02/08/16 2121  AST 19  ALT 16*  ALKPHOS 56  BILITOT 0.6  PROT 6.4*  ALBUMIN 3.8   No results for input(s): LIPASE, AMYLASE in the last 168 hours. No results for input(s): AMMONIA in the last 168 hours. CBC:  Recent Labs Lab 02/08/16 2121 02/08/16 2227 02/09/16 0302  WBC 14.8*  --  13.0*  NEUTROABS 12.0*  --  11.0*  HGB 14.8 17.0 13.3  HCT 45.5 50.0 40.3  MCV 94.0  --  94.4  PLT 202  --  188   Cardiac Enzymes: No results for input(s): CKTOTAL, CKMB, CKMBINDEX, TROPONINI in the last 168 hours. BNP: BNP (last 3 results) No results for input(s): BNP in the last 8760 hours.  ProBNP (last 3 results) No results for input(s): PROBNP in the last 8760 hours.  CBG: No results for input(s): GLUCAP in the last 168 hours.     Signed:  Louellen Molder MD.  [facility] 02/09/2016, 2:59 PM

## 2016-02-09 NOTE — ED Notes (Signed)
Roy notified of pts plan for discharge, per Carloyn Manner the pts neighbor Lajean Silvius will be coming to visit with the pt & can transport the pt home

## 2016-02-09 NOTE — ED Notes (Signed)
Hospitalist at bedside 

## 2016-02-10 LAB — HEMOGLOBIN A1C
Hgb A1c MFr Bld: 5.5 % (ref 4.8–5.6)
Mean Plasma Glucose: 111 mg/dL

## 2016-02-11 LAB — URINE CULTURE: Special Requests: NORMAL

## 2016-06-16 ENCOUNTER — Telehealth: Payer: Self-pay | Admitting: *Deleted

## 2016-06-16 NOTE — Telephone Encounter (Signed)
Notified patient that he does not meet criteria for low dose lung cancer screening CT scan due to age. Informed him to observe for any symptoms that should be reported to his physician.

## 2017-06-27 ENCOUNTER — Ambulatory Visit (INDEPENDENT_AMBULATORY_CARE_PROVIDER_SITE_OTHER): Payer: Medicare Other | Admitting: Urology

## 2017-06-27 ENCOUNTER — Encounter: Payer: Self-pay | Admitting: Urology

## 2017-06-27 DIAGNOSIS — E291 Testicular hypofunction: Secondary | ICD-10-CM

## 2017-06-27 DIAGNOSIS — N5201 Erectile dysfunction due to arterial insufficiency: Secondary | ICD-10-CM

## 2017-06-27 DIAGNOSIS — N179 Acute kidney failure, unspecified: Secondary | ICD-10-CM | POA: Diagnosis not present

## 2017-06-27 DIAGNOSIS — R3915 Urgency of urination: Secondary | ICD-10-CM | POA: Diagnosis not present

## 2017-06-27 DIAGNOSIS — N529 Male erectile dysfunction, unspecified: Secondary | ICD-10-CM | POA: Insufficient documentation

## 2017-06-27 NOTE — Progress Notes (Signed)
06/27/2017 7:34 AM   George [name] 03/13/1932 [id number]  Referring provider: Juluis Pitch, MD [phone], [place] [address]  CC: new patient  HPI:  1 - Erectile Dysfunction  / Anejaculation- pt's girlfriend veyr worried about his next to zero volume ejaculate. Amazingly he can still engage in penetration intercourrse to orgasm. He is on alpha blockers (retrograde ejaculaiton known side effect) and exogenous androgens (decrease testicular semen production, used as male birth control in some Asian contries).   2 - Lower Urinary Tract Symptoms - on 4mg  doxazosin per PCP x years fo rmild bother with obstructive urinary sympotms. DRE 2018 60gm.   3 - Stage 3 Chronic Renal Insufficiency - Cr 1.8-2's at baseline. No renal imaging avail for review. He has HTN and diabetes.  4 - Hypogonadism- on T cypionate 200 Q2 weeks for symptoms of depression / fatigue. managed by PCP. He does get at least Q31mo lab surveillance.   PMH sig for obesity, DM2, HTN, colon cancer. His PCP is Juluis Pitch MD.   Today "George [name]" is seen as new patient for opinion on anejaculation.   PMH: Past Medical History:  Diagnosis Date  . Cancer ([place])   . Diabetes mellitus without complication [facility])     Surgical History: Past Surgical History:  Procedure Laterality Date  . CARDIAC CATHETERIZATION     has a stent    Home Medications:  Allergies as of 06/27/2017   No Known Allergies     Medication List       Accurate as of 06/27/17  7:34 AM. Always use your most recent med list.          aspirin EC 81 MG tablet Take 81 mg by mouth daily.   COQ-10 PO Take 1 tablet by mouth daily.   doxazosin 4 MG tablet Commonly known as:  CARDURA Take 4 mg by mouth at bedtime.   FISH OIL PO Take 1 capsule by mouth daily.   multivitamin with minerals Tabs tablet Take 1 tablet by mouth daily.   omeprazole 20 MG capsule Commonly known as:  PRILOSEC Take 20 mg by mouth daily.   simvastatin 20  MG tablet Commonly known as:  ZOCOR Take 10 mg by mouth at bedtime.   verapamil 240 MG CR tablet Commonly known as:  CALAN-SR Take 240 mg by mouth 2 (two) times daily.   VITAMIN B-12 IJ Inject 1 Dose as directed every 14 (fourteen) days. Next dose is 02/10/16       Allergies: No Known Allergies  Family History: No family history on file.  Social History:  reports that he has been smoking Cigarettes.  He has been smoking about 1.00 pack per day. He has never used smokeless tobacco. He reports that he drinks alcohol. He reports that he does not use drugs.    Review of Systems  Gastrointestinal (upper)  : Negative for upper GI symptoms  Gastrointestinal (lower) : Negative for lower GI symptoms  Constitutional : Negative for symptoms  Skin: Negative for skin symptoms  Eyes: Negative for eye symptoms  Ear/Nose/Throat : Negative for Ear/Nose/Throat symptoms  Hematologic/Lymphatic: Negative for Hematologic/Lymphatic symptoms  Cardiovascular : Negative for cardiovascular symptoms  Respiratory : Negative for respiratory symptoms  Endocrine: Negative for endocrine symptoms  Musculoskeletal: Negative for musculoskeletal symptoms  Neurological: Negative for neurological symptoms  Psychologic: Negative for psychiatric symptoms   Physical Exam: There were no vitals taken for this visit.  Constitutional:  Alert and oriented, No acute distress. HEENT:  [place] AT, moist mucus membranes.  Trachea midline, no masses. Cardiovascular: No clubbing, cyanosis, or edema. Respiratory: Normal respiratory effort, no increased work of breathing. GI: Abdomen is soft, nontender, nondistended, no abdominal masses GU: No CVA tenderness. Phallus straight, testes down.  Skin: No rashes, bruises or suspicious lesions. Lymph: No cervical or inguinal adenopathy. Neurologic: Grossly intact, no focal deficits, moving all 4 extremities. Psychiatric: Normal mood and affect.  Laboratory  Data: Lab Results  Component Value Date   WBC 13.0 (H) 02/09/2016   HGB 13.3 02/09/2016   HCT 40.3 02/09/2016   MCV 94.4 02/09/2016   PLT 188 02/09/2016    Lab Results  Component Value Date   CREATININE 1.86 (H) 02/09/2016    No results found for: PSA  No results found for: TESTOSTERONE  Lab Results  Component Value Date   HGBA1C 5.5 02/09/2016    Urinalysis    Component Value Date/Time   COLORURINE YELLOW 02/09/2016 [place] 02/09/2016 0752   LABSPEC 1.018 02/09/2016 0752   PHURINE 5.0 02/09/2016 0752   GLUCOSEU NEGATIVE 02/09/2016 0752   HGBUR NEGATIVE 02/09/2016 0752   BILIRUBINUR NEGATIVE 02/09/2016 0752   KETONESUR NEGATIVE 02/09/2016 0752   PROTEINUR NEGATIVE 02/09/2016 0752   NITRITE NEGATIVE 02/09/2016 0752   LEUKOCYTESUR SMALL (A) 02/09/2016 0752    Pertinent Imaging: none  Assessment & Plan:    1 - Erectile Dysfunction / Anejaculation - discussed that his anejaculation is multifactorial result of normal aging, alpha blocker use, exogenous androgen use and is NOT dangerous. He is quite reassured. Encouraged to hold alpha blocker x few days as that may help some and he might try, understanindg that LUTS would worsen.  2 - Lower Urinary Tract Symptoms - continue doxazosin PRN.   3 - Stage 3 Chronic Renal Insufficiency - likely medical renal disease. Renal US on return to r/o sig hydro.   4 - Hypogonadism- per PCP, reinforced risks including increased CV disease, accelerated benign and malignant prostate growth and increased all cause mortality. Reinforced importance of Biannual lab surveillance which he is quite compliant with.  RTC Urol PRN.   Alexis Frock, [facility] [address] [place], [place] [address] [phone]

## 2018-07-10 ENCOUNTER — Other Ambulatory Visit: Payer: Self-pay

## 2018-07-10 DIAGNOSIS — E1151 Type 2 diabetes mellitus with diabetic peripheral angiopathy without gangrene: Secondary | ICD-10-CM | POA: Diagnosis not present

## 2018-07-10 DIAGNOSIS — R52 Pain, unspecified: Secondary | ICD-10-CM | POA: Diagnosis present

## 2018-07-10 DIAGNOSIS — Z794 Long term (current) use of insulin: Secondary | ICD-10-CM | POA: Diagnosis not present

## 2018-07-10 DIAGNOSIS — Z859 Personal history of malignant neoplasm, unspecified: Secondary | ICD-10-CM | POA: Insufficient documentation

## 2018-07-10 DIAGNOSIS — M25572 Pain in left ankle and joints of left foot: Secondary | ICD-10-CM | POA: Diagnosis not present

## 2018-07-10 DIAGNOSIS — R3915 Urgency of urination: Secondary | ICD-10-CM | POA: Diagnosis not present

## 2018-07-10 DIAGNOSIS — Z7982 Long term (current) use of aspirin: Secondary | ICD-10-CM | POA: Diagnosis not present

## 2018-07-10 DIAGNOSIS — N289 Disorder of kidney and ureter, unspecified: Secondary | ICD-10-CM | POA: Insufficient documentation

## 2018-07-10 DIAGNOSIS — K219 Gastro-esophageal reflux disease without esophagitis: Secondary | ICD-10-CM | POA: Diagnosis not present

## 2018-07-10 DIAGNOSIS — M79662 Pain in left lower leg: Secondary | ICD-10-CM | POA: Diagnosis not present

## 2018-07-10 DIAGNOSIS — Z79899 Other long term (current) drug therapy: Secondary | ICD-10-CM | POA: Diagnosis not present

## 2018-07-10 DIAGNOSIS — Z955 Presence of coronary angioplasty implant and graft: Secondary | ICD-10-CM | POA: Insufficient documentation

## 2018-07-10 DIAGNOSIS — E785 Hyperlipidemia, unspecified: Secondary | ICD-10-CM | POA: Diagnosis not present

## 2018-07-10 DIAGNOSIS — Z89421 Acquired absence of other right toe(s): Secondary | ICD-10-CM | POA: Diagnosis not present

## 2018-07-10 DIAGNOSIS — F17210 Nicotine dependence, cigarettes, uncomplicated: Secondary | ICD-10-CM | POA: Insufficient documentation

## 2018-07-10 DIAGNOSIS — E860 Dehydration: Secondary | ICD-10-CM | POA: Diagnosis not present

## 2018-07-10 DIAGNOSIS — L0390 Cellulitis, unspecified: Secondary | ICD-10-CM | POA: Diagnosis present

## 2018-07-10 DIAGNOSIS — I10 Essential (primary) hypertension: Secondary | ICD-10-CM | POA: Insufficient documentation

## 2018-07-10 LAB — BASIC METABOLIC PANEL
Anion gap: 7 (ref 5–15)
BUN: 29 mg/dL — AB (ref 8–23)
CALCIUM: 8.3 mg/dL — AB (ref 8.9–10.3)
CO2: 25 mmol/L (ref 22–32)
Chloride: 109 mmol/L (ref 98–111)
Creatinine, Ser: 1.59 mg/dL — ABNORMAL HIGH (ref 0.61–1.24)
GFR calc Af Amer: 44 mL/min — ABNORMAL LOW (ref >60)
GFR, EST NON AFRICAN AMERICAN: 38 mL/min — AB (ref >60)
GLUCOSE: 123 mg/dL — AB (ref 70–99)
Potassium: 4.3 mmol/L (ref 3.5–5.1)
Sodium: 141 mmol/L (ref 135–145)

## 2018-07-10 LAB — CBC
HCT: 39.4 % — ABNORMAL LOW (ref 40.0–52.0)
Hemoglobin: 13.6 g/dL (ref 13.0–18.0)
MCH: 33.3 pg (ref 26.0–34.0)
MCHC: 34.4 g/dL (ref 32.0–36.0)
MCV: 96.6 fL (ref 80.0–100.0)
Platelets: 169 10*3/uL (ref 150–440)
RBC: 4.08 MIL/uL — ABNORMAL LOW (ref 4.40–5.90)
RDW: 13.8 % (ref 11.5–14.5)
WBC: 13 10*3/uL — ABNORMAL HIGH (ref 3.8–10.6)

## 2018-07-10 LAB — TROPONIN I

## 2018-07-10 NOTE — ED Triage Notes (Signed)
Pt arrives to ED via [facility] from home with c/o bilateral foot pain. No recent injury or trauma. Pt states the started over the weekend and progressively worsened. Bilateral feet appear red and swollen, but not hot to touch.

## 2018-07-10 NOTE — ED Notes (Signed)
Patient to waiting room via wheelchair by EMS.  Per EMS patient with pain to right foot that started Satruday, left foot pain that started Sunday and now with pain up into right knee.  Patient with possible history of gout. +cardiac history with stent placed 20 years ago.  EMS treatment IV of normal saline via 18g angiocath to right forearm.  Vitals signs reported as within normal limits, cbg - 115.

## 2018-07-11 ENCOUNTER — Observation Stay
Admission: EM | Admit: 2018-07-11 | Discharge: 2018-07-12 | Disposition: A | Discharge status: Home / Self Care | Payer: Medicare Other | Attending: Family Medicine | Admitting: Family Medicine

## 2018-07-11 ENCOUNTER — Observation Stay: Payer: Medicare Other

## 2018-07-11 DIAGNOSIS — R52 Pain, unspecified: Secondary | ICD-10-CM

## 2018-07-11 DIAGNOSIS — M79672 Pain in left foot: Secondary | ICD-10-CM

## 2018-07-11 DIAGNOSIS — M79673 Pain in unspecified foot: Secondary | ICD-10-CM | POA: Diagnosis present

## 2018-07-11 DIAGNOSIS — L0390 Cellulitis, unspecified: Secondary | ICD-10-CM

## 2018-07-11 DIAGNOSIS — M79671 Pain in right foot: Secondary | ICD-10-CM

## 2018-07-11 LAB — GLUCOSE, CAPILLARY
Glucose-Capillary: 136 mg/dL — ABNORMAL HIGH (ref 70–99)
Glucose-Capillary: 154 mg/dL — ABNORMAL HIGH (ref 70–99)
Glucose-Capillary: 172 mg/dL — ABNORMAL HIGH (ref 70–99)

## 2018-07-11 LAB — URIC ACID: Uric Acid, Serum: 8.1 mg/dL (ref 3.7–8.6)

## 2018-07-11 MED ORDER — INSULIN ASPART 100 UNIT/ML ~~LOC~~ SOLN: 0.0000 [IU] | Freq: Every day | SUBCUTANEOUS | Status: DC

## 2018-07-11 MED ORDER — SODIUM CHLORIDE 0.9 % IV SOLN: 250.0000 mL | INTRAVENOUS | Status: DC | PRN

## 2018-07-11 MED ORDER — ONDANSETRON HCL 4 MG/2ML IJ SOLN: 4.0000 mg | Freq: Four times a day (QID) | INTRAMUSCULAR | Status: DC | PRN

## 2018-07-11 MED ORDER — CEPHALEXIN 500 MG PO CAPS
500.0000 mg | ORAL_CAPSULE | Freq: Two times a day (BID) | ORAL | Status: DC
  Administered 2018-07-11 – 2018-07-12 (×3): 500 mg via ORAL
  Filled 2018-07-11 (×3): qty 1

## 2018-07-11 MED ORDER — SODIUM CHLORIDE 0.9% FLUSH
3.0000 mL | Freq: Two times a day (BID) | INTRAVENOUS | Status: DC
  Administered 2018-07-12: 3 mL via INTRAVENOUS

## 2018-07-11 MED ORDER — PREDNISONE 20 MG PO TABS
20.0000 mg | ORAL_TABLET | Freq: Every day | ORAL | Status: DC
  Administered 2018-07-12: 20 mg via ORAL
  Filled 2018-07-11: qty 1

## 2018-07-11 MED ORDER — ACETAMINOPHEN 650 MG RE SUPP: 650.0000 mg | Freq: Four times a day (QID) | RECTAL | Status: DC | PRN

## 2018-07-11 MED ORDER — ONDANSETRON HCL 4 MG PO TABS: 4.0000 mg | ORAL_TABLET | Freq: Four times a day (QID) | ORAL | Status: DC | PRN

## 2018-07-11 MED ORDER — SODIUM CHLORIDE 0.9% FLUSH: 3.0000 mL | INTRAVENOUS | Status: DC | PRN

## 2018-07-11 MED ORDER — SIMVASTATIN 20 MG PO TABS
10.0000 mg | ORAL_TABLET | Freq: Every day | ORAL | Status: DC
  Administered 2018-07-11: 10 mg via ORAL
  Filled 2018-07-11: qty 1

## 2018-07-11 MED ORDER — VERAPAMIL HCL ER 240 MG PO TBCR
240.0000 mg | EXTENDED_RELEASE_TABLET | Freq: Two times a day (BID) | ORAL | Status: DC
  Administered 2018-07-11 – 2018-07-12 (×2): 240 mg via ORAL
  Filled 2018-07-11 (×4): qty 1

## 2018-07-11 MED ORDER — POLYETHYLENE GLYCOL 3350 17 G PO PACK: 17.0000 g | PACK | Freq: Every day | ORAL | Status: DC | PRN

## 2018-07-11 MED ORDER — COLCHICINE 0.6 MG PO TABS: 0.6000 mg | ORAL_TABLET | Freq: Two times a day (BID) | ORAL | Status: AC

## 2018-07-11 MED ORDER — SODIUM CHLORIDE 0.45 % IV SOLN
INTRAVENOUS | Status: DC
  Administered 2018-07-11: 16:00:00 via INTRAVENOUS

## 2018-07-11 MED ORDER — COLCHICINE 0.6 MG PO TABS
0.6000 mg | ORAL_TABLET | Freq: Three times a day (TID) | ORAL | Status: DC
  Administered 2018-07-11 – 2018-07-12 (×5): 0.6 mg via ORAL
  Filled 2018-07-11 (×6): qty 1

## 2018-07-11 MED ORDER — ACETAMINOPHEN 325 MG PO TABS: 650.0000 mg | ORAL_TABLET | Freq: Four times a day (QID) | ORAL | Status: DC | PRN

## 2018-07-11 MED ORDER — FENTANYL CITRATE (PF) 100 MCG/2ML IJ SOLN
50.0000 ug | Freq: Once | INTRAMUSCULAR | Status: AC
  Administered 2018-07-11: 50 ug via INTRAVENOUS
  Filled 2018-07-11: qty 2

## 2018-07-11 MED ORDER — ENOXAPARIN SODIUM 40 MG/0.4ML ~~LOC~~ SOLN
40.0000 mg | SUBCUTANEOUS | Status: DC
  Administered 2018-07-11: 40 mg via SUBCUTANEOUS
  Filled 2018-07-11: qty 0.4

## 2018-07-11 MED ORDER — METHYLPREDNISOLONE SODIUM SUCC 125 MG IJ SOLR
125.0000 mg | Freq: Once | INTRAMUSCULAR | Status: AC
  Administered 2018-07-11: 125 mg via INTRAVENOUS
  Filled 2018-07-11: qty 2

## 2018-07-11 MED ORDER — DOCUSATE SODIUM 100 MG PO CAPS
100.0000 mg | ORAL_CAPSULE | Freq: Two times a day (BID) | ORAL | Status: DC
  Administered 2018-07-11 – 2018-07-12 (×3): 100 mg via ORAL
  Filled 2018-07-11 (×3): qty 1

## 2018-07-11 MED ORDER — CYANOCOBALAMIN 1000 MCG/ML IJ SOLN: 1000.0000 ug | INTRAMUSCULAR | Status: DC

## 2018-07-11 MED ORDER — INSULIN ASPART 100 UNIT/ML ~~LOC~~ SOLN
0.0000 [IU] | Freq: Three times a day (TID) | SUBCUTANEOUS | Status: DC
  Administered 2018-07-11: 3 [IU] via SUBCUTANEOUS
  Administered 2018-07-12: 4 [IU] via SUBCUTANEOUS
  Administered 2018-07-12: 3 [IU] via SUBCUTANEOUS
  Filled 2018-07-11 (×3): qty 1

## 2018-07-11 MED ORDER — DOXAZOSIN MESYLATE 4 MG PO TABS
4.0000 mg | ORAL_TABLET | Freq: Every day | ORAL | Status: DC
  Filled 2018-07-11 (×2): qty 1

## 2018-07-11 MED ORDER — PANTOPRAZOLE SODIUM 40 MG PO TBEC
40.0000 mg | DELAYED_RELEASE_TABLET | Freq: Every day | ORAL | Status: DC
  Administered 2018-07-11 – 2018-07-12 (×2): 40 mg via ORAL
  Filled 2018-07-11 (×2): qty 1

## 2018-07-11 MED ORDER — HYDRALAZINE HCL 20 MG/ML IJ SOLN: 10.0000 mg | INTRAMUSCULAR | Status: DC | PRN

## 2018-07-11 MED ORDER — INDOMETHACIN 50 MG PO CAPS
50.0000 mg | ORAL_CAPSULE | Freq: Three times a day (TID) | ORAL | Status: AC
  Administered 2018-07-11 – 2018-07-12 (×3): 50 mg via ORAL
  Filled 2018-07-11 (×3): qty 1

## 2018-07-11 MED ORDER — HYDROCODONE-ACETAMINOPHEN 5-325 MG PO TABS: 1.0000 | ORAL_TABLET | ORAL | Status: DC | PRN

## 2018-07-11 NOTE — ED Notes (Signed)
Lab notified to add on Uric Acid.

## 2018-07-11 NOTE — H&P (Signed)
[name] [place] NAME: George [name]    MR#:  [id number]  DATE OF BIRTH:  1932-07-08  DATE OF ADMISSION:  07/11/2018  PRIMARY CARE PHYSICIAN: Juluis Pitch, MD   REQUESTING/REFERRING PHYSICIAN:   CHIEF COMPLAINT:   Chief Complaint  Patient presents with  . Foot Pain    HISTORY OF PRESENT ILLNESS: George [name]  is a 84 y.o. male with a known history of per below which also includes diabetes, cancer, peripheral vascular disease, presenting with 3-day history of worsening pain in his lower extremities, started to have right foot pain on Saturday-could not walk on it, began to favor his left foot, left foot became painful as well, left knee began to hurt as well, pain very intense 9/10 in terms of intensity, unable to stand/ambulate, in the emergency room work-up noted for creatinine 1.5, white count 13, uric acid level was high normal, patient denies any history of trauma/gout, pain made better with IV pain medicine/colchicine given in the emergency room, patient is now been admitted to regular nursing for bed on observation for acute probable poly-articular gouty attack.  PAST MEDICAL HISTORY:   Past Medical History:  Diagnosis Date  . Cancer ([place])   . Diabetes mellitus without complication ([place])     PAST SURGICAL HISTORY:  Past Surgical History:  Procedure Laterality Date  . CARDIAC CATHETERIZATION     has a stent    SOCIAL HISTORY:  Social History   Tobacco Use  . Smoking status: Current Every Day Smoker    Packs/day: 1.00    Types: Cigarettes  . Smokeless tobacco: Never Used  Substance Use Topics  . Alcohol use: Yes    FAMILY HISTORY:  Family History  Problem Relation Age of Onset  . Prostate cancer Neg Hx   . Bladder Cancer Neg Hx   . Kidney cancer Neg Hx     DRUG ALLERGIES: No Known Allergies  REVIEW OF SYSTEMS:   CONSTITUTIONAL: No fever, fatigue or weakness.  EYES: No blurred or double vision.  EARS,  NOSE, AND THROAT: No tinnitus or ear pain.  RESPIRATORY: No cough, shortness of breath, wheezing or hemoptysis.  CARDIOVASCULAR: No chest pain, orthopnea, edema.  GASTROINTESTINAL: No nausea, vomiting, diarrhea or abdominal pain.  GENITOURINARY: No dysuria, hematuria.  ENDOCRINE: No polyuria, nocturia,  HEMATOLOGY: No anemia, easy bruising or bleeding SKIN: No rash or lesion. MUSCULOSKELETAL: Right/left foot pain, left knee pain    NEUROLOGIC: No tingling, numbness, weakness.  PSYCHIATRY: No anxiety or depression.   MEDICATIONS AT HOME:  Prior to Admission medications   Medication Sig Start Date End Date Taking? Authorizing Provider  cyanocobalamin (,VITAMIN B-12,) 1000 MCG/ML injection Inject 1,000 mcg into the muscle every 30 (thirty) days.   Yes [provider]  doxazosin (CARDURA) 4 MG tablet Take 4 mg by mouth at bedtime.   Yes [provider]  hydrochlorothiazide (HYDRODIURIL) 25 MG tablet Take 25 mg by mouth daily.   Yes [provider]  lisinopril (PRINIVIL,ZESTRIL) 20 MG tablet Take 20 mg by mouth 2 (two) times daily.   Yes [provider]  metFORMIN (GLUCOPHAGE-XR) 750 MG 24 hr tablet Take 750 mg by mouth 2 (two) times daily.   Yes [provider]  omeprazole (PRILOSEC) 20 MG capsule Take 20 mg by mouth daily.   Yes [provider]  sildenafil (REVATIO) 20 MG tablet Take 40-100 mg by mouth as needed (for ED).    Yes [provider]  simvastatin (ZOCOR) 20 MG tablet Take 10 mg by mouth at bedtime.   Yes [provider]  testosterone cypionate (DEPOTESTOSTERONE CYPIONATE) 200 MG/ML injection Inject 200 mg into the muscle every 14 (fourteen) days.   Yes [provider]  verapamil (CALAN-SR) 240 MG CR tablet Take 240 mg by mouth 2 (two) times daily.   Yes [provider]      PHYSICAL EXAMINATION:   VITAL SIGNS: Blood pressure 127/65, pulse 70, temperature 98.4 F (36.9 C), temperature  source Oral, resp. rate 18, height 5\' 10"  (1.778 m), weight 80.7 kg, SpO2 97 %.  GENERAL:  84 y.o.-year-old patient lying in the bed with no acute distress.  EYES: Pupils equal, round, reactive to light and accommodation. No scleral icterus. Extraocular muscles intact.  HEENT: Head atraumatic, normocephalic. Oropharynx and nasopharynx clear.  NECK:  Supple, no jugular venous distention. No thyroid enlargement, no tenderness.  LUNGS: Normal breath sounds bilaterally, no wheezing, rales,rhonchi or crepitation. No use of accessory muscles of respiration.  CARDIOVASCULAR: S1, S2 normal. No murmurs, rubs, or gallops.  ABDOMEN: Soft, nontender, nondistended. Bowel sounds present. No organomegaly or mass.  EXTREMITIES: Left foot swelling/erythema, left foot swelling, left knee swelling-severely decreased range of motion NEUROLOGIC: Cranial nerves II through XII are intact. Muscle strength 5/5 in all extremities. Sensation intact. Gait not checked.  PSYCHIATRIC: The patient is alert and oriented x 3.  SKIN: No obvious rash, lesion, or ulcer.   LABORATORY PANEL:   CBC Recent Labs  Lab 07/10/18 2311  WBC 13.0*  HGB 13.6  HCT 39.4*  PLT 169  MCV 96.6  MCH 33.3  MCHC 34.4  RDW 13.8   ------------------------------------------------------------------------------------------------------------------  Chemistries  Recent Labs  Lab 07/10/18 2311  NA 141  K 4.3  CL 109  CO2 25  GLUCOSE 123*  BUN 29*  CREATININE 1.59*  CALCIUM 8.3*   ------------------------------------------------------------------------------------------------------------------ estimated creatinine clearance is 35.7 mL/min (A) (by C-G formula based on SCr of 1.59 mg/dL (H)). ------------------------------------------------------------------------------------------------------------------ No results for input(s): TSH, T4TOTAL, T3FREE, THYROIDAB in the last 72 hours.  Invalid input(s): FREET3   Coagulation  profile No results for input(s): INR, PROTIME in the last 168 hours. ------------------------------------------------------------------------------------------------------------------- No results for input(s): DDIMER in the last 72 hours. -------------------------------------------------------------------------------------------------------------------  Cardiac Enzymes Recent Labs  Lab 07/10/18 2311  TROPONINI <0.03   ------------------------------------------------------------------------------------------------------------------ Invalid input(s): POCBNP  ---------------------------------------------------------------------------------------------------------------  Urinalysis    Component Value Date/Time   COLORURINE YELLOW 02/09/2016 [place] 02/09/2016 0752   LABSPEC 1.018 02/09/2016 0752   PHURINE 5.0 02/09/2016 0752   GLUCOSEU NEGATIVE 02/09/2016 0752   HGBUR NEGATIVE 02/09/2016 0752   BILIRUBINUR NEGATIVE 02/09/2016 0752   KETONESUR NEGATIVE 02/09/2016 0752   PROTEINUR NEGATIVE 02/09/2016 0752   NITRITE NEGATIVE 02/09/2016 0752   LEUKOCYTESUR SMALL (A) 02/09/2016 0752     RADIOLOGY: No results found.  EKG: Orders placed or performed during the hospital encounter of 02/08/16  . ED EKG  . ED EKG  . EKG    IMPRESSION AND PLAN: *Acute polyarticular joint pain Most likely secondary to acute polyarticular gout attack Referred to the observation unit, bilateral foot x-rays, continue colchicine twice daily, Indocin 3 times daily, IV Solu-Medrol x1, adult pain protocol, and continue close medical monitoring Podiatry consulted in the emergency room  *Chronic diabetes mellitus type 2 Hold metformin while in house, sliding scale insulin with Accu-Cheks per routine, check hemoglobin C determine level control  *Chronic peripheral vascular disease Stable Conservative medical management  *Chronic benign essential  hypertension Hold  hydrochlorothiazide/lisinopril given relative dehydration, renal insufficiency, BMP in the morning  *Chronic hyperlipidemia, unspecified Statin therapy  All the records are reviewed and case discussed with ED provider. Management plans discussed with the patient, family and they are in agreement.  CODE STATUS:full Code Status History    Date Active Date Inactive Code Status Order ID Comments User Context   02/09/2016 0036 02/09/2016 1914 Full Code [id number]  Norval Morton, MD ED       TOTAL TIME TAKING CARE OF THIS PATIENT: 45 minutes.    Avel Peace Salary M.D on 07/11/2018   Between 7am to 6pm - Pager - [phone]  After 6pm go to [email or web address] - password [email or web address] [facility]  Office  [phone]  CC: Primary care physician; Juluis Pitch, MD   Note: This dictation was prepared with Dragon dictation along with smaller phrase technology. Any transcriptional errors that result from this process are unintentional.

## 2018-07-11 NOTE — ED Notes (Signed)
PT at bedside.

## 2018-07-11 NOTE — Consult Note (Signed)
Reason for Consult: Severe pain both feet Referring Physician: Darrold Bezek is an 84 y.o. male.  HPI: This is an 84 year old male who states that Saturday started to have some severe pain in his left foot.  Had to walk around the house with the assistance of a broom.  Then started to get pain in his right foot as well as into the knee.  States that yesterday he could not put any weight on the feet or stand.  He did present to the emergency department for evaluation.  Patient states that the pain has improved with medications given since he has been at the emergency department.  Past Medical History:  Diagnosis Date  . Cancer ([place])   . Diabetes mellitus without complication [facility])     Past Surgical History:  Procedure Laterality Date  . CARDIAC CATHETERIZATION     has a stent    Family History  Problem Relation Age of Onset  . Prostate cancer Neg Hx   . Bladder Cancer Neg Hx   . Kidney cancer Neg Hx     Social History:  reports that he has been smoking cigarettes. He has been smoking about 1.00 pack per day. He has never used smokeless tobacco. He reports that he drinks alcohol. He reports that he does not use drugs.  Allergies: No Known Allergies  Medications:  Scheduled: . cephALEXin  500 mg Oral Q12H  . colchicine  0.6 mg Oral TID  . doxazosin  4 mg Oral QHS  . insulin aspart  0-20 Units Subcutaneous TID WC  . insulin aspart  0-5 Units Subcutaneous QHS  . verapamil  240 mg Oral BID    Results for orders placed or performed during the hospital encounter of 07/11/18 (from the past 48 hour(s))  Basic metabolic panel     Status: Abnormal   Collection Time: 07/10/18 11:11 PM  Result Value Ref Range   Sodium 141 135 - 145 mmol/L   Potassium 4.3 3.5 - 5.1 mmol/L   Chloride 109 98 - 111 mmol/L   CO2 25 22 - 32 mmol/L   Glucose, Bld 123 (H) 70 - 99 mg/dL   BUN 29 (H) 8 - 23 mg/dL   Creatinine, Ser 1.59 (H) 0.61 - 1.24 mg/dL   Calcium 8.3 (L) 8.9 - 10.3 mg/dL   GFR calc non Af Amer 38 (L) >60 mL/min   GFR calc Af Amer 44 (L) >60 mL/min    Comment: (NOTE) The eGFR has been calculated using the CKD EPI equation. This calculation has not been validated in all clinical situations. eGFR's persistently <60 mL/min signify possible Chronic Kidney Disease.    Anion gap 7 5 - 15    Comment: Performed at [facility], [address] [place], [place] [address]  CBC     Status: Abnormal   Collection Time: 07/10/18 11:11 PM  Result Value Ref Range   WBC 13.0 (H) 3.8 - 10.6 K/uL   RBC 4.08 (L) 4.40 - 5.90 MIL/uL   Hemoglobin 13.6 13.0 - 18.0 g/dL   HCT 39.4 (L) 40.0 - 52.0 %   MCV 96.6 80.0 - 100.0 fL   MCH 33.3 26.0 - 34.0 pg   MCHC 34.4 32.0 - 36.0 g/dL   RDW 13.8 11.5 - 14.5 %   Platelets 169 150 - 440 K/uL    Comment: Performed at [facility], [address] [place], [place] [address]  Troponin I     Status: None  Collection Time: 07/10/18 11:11 PM  Result Value Ref Range   Troponin I <0.03 <0.03 ng/mL    Comment: Performed at [facility], [address] [place], [place] [address]  Uric acid     Status: None   Collection Time: 07/10/18 11:11 PM  Result Value Ref Range   Uric Acid, Serum 8.1 3.7 - 8.6 mg/dL    Comment: Performed at [facility], [address] [place], [place] [address]  Glucose, capillary     Status: Abnormal   Collection Time: 07/11/18 12:23 PM  Result Value Ref Range   Glucose-Capillary 136 (H) 70 - 99 mg/dL  Glucose, capillary     Status: Abnormal   Collection Time: 07/11/18  1:27 PM  Result Value Ref Range   Glucose-Capillary 154 (H) 70 - 99 mg/dL    Dg Foot 2 Views Left  Result Date: 07/11/2018 CLINICAL DATA:  History of diabetes, worsening pain in the lower extremities over the last 3 days EXAM: LEFT FOOT - 2 VIEW COMPARISON:  None. FINDINGS: There is some discontinuity of the cortical margin of the distal portion of the proximal phalanx of the left fifth toe. Although  no definite linear fracture is evident this could be due to either acute or old fracture final favoring old fracture. Tarsal-metatarsal alignment is normal. Mild degenerative changes present in the midfoot and a small plantar calcaneal degenerative spur is noted. Arterial calcifications are present. IMPRESSION: 1. No definite acute fracture. 2. Probable old fracture of the distal aspect of the proximal phalanx of the left fifth toe as noted above. 3. Mild degenerative changes. Electronically Signed   By: Ivar Drape M.D.   On: 07/11/2018 08:45   Dg Foot 2 Views Right  Result Date: 07/11/2018 CLINICAL DATA:  History of diabetes, right foot pain for several days EXAM: RIGHT FOOT - 2 VIEW COMPARISON:  None. FINDINGS: The patient appears to have undergone prior amputation of the right fifth toe. However, there is cortical erosion of the distal phalanx of the fourth toe and possible focal demineralization of the distal remaining portion of the fifth metatarsal laterally, and active osteomyelitis cannot be excluded. Clinical correlation is recommended. No other abnormality is seen. IMPRESSION: Apparent erosion of the a portion of the distal phalanx of the fourth toe, and possibly a remaining portion of the tiny remaining proximal phalanx and very distal portion of the fifth metatarsal, suspicious for osteomyelitis. Correlate clinically. Electronically Signed   By: Ivar Drape M.D.   On: 07/11/2018 09:05    Review of Systems  Unable to perform ROS: Other  HENT:       Significant hearing loss and the batteries on his hearing aids have died   Blood pressure 136/60, pulse 74, temperature 98.4 F (36.9 C), temperature source Oral, resp. rate 18, height 5' 10" (1.778 m), weight 80.7 kg, SpO2 92 %. Physical Exam  Cardiovascular:  DP and PT pulses are fully palpable 2/4.  Musculoskeletal:  Severely guarded and limited range of motion in the feet bilateral.  Exquisite pain on any attempted palpation or motion in  the left midfoot with milder discomfort on the right.  Muscle testing is deferred.  Neurological:  Protective threshold with a monofilament wire is grossly intact and symmetric bilateral.  Skin:  The skin is warm dry and supple.  There is some bilateral edema in the feet with some mild erythema dorsally over the midfoot bilateral.  No open lesions.    Assessment/Plan: Assessment: Recent onset severe  pain both feet, most likely gout  Plan: Would recommend continuing with colchicine and steroid therapy.  May be discharged when stable.  George [name] 07/11/2018, 1:34 PM

## 2018-07-11 NOTE — ED Notes (Signed)
Pt given breakfast tray

## 2018-07-11 NOTE — ED Provider Notes (Signed)
-----------------------------------------   7:40 AM on 07/11/2018 -----------------------------------------  Signed out to me at this time. Pt with pain and redness and swelling to feet, left > right. Has also a swollen tender spot distal to L knee. On exam has strong pulses. No evidence of dvt. Uncomfortable, requesting pain medications. Dd/x does include gout vs cellulitis. Unable to ambulate from pain. Will give pain meds. Pt lives at home, is not safe for d/c.  Podiatry has been consulted, but not yet called back. WBC elevated. Will admit for further evaluation.    Schuyler Amor, MD 07/11/18 [phone]

## 2018-07-11 NOTE — Evaluation (Signed)
Physical Therapy Evaluation Patient Details Name: George [name] MRN: [id number] DOB: 1931-11-19 Today's Date: 07/11/2018   History of Present Illness  Patient is an 84 year old male admitted to ED with pain in B LEs, unable to bear weight. Patient lives alone at baseline and does not use AD. PMH to include: DM, Cancer, PVD.    Clinical Impression  Patient cooperative, received lying in bed. Patient is very [place]. Patient having significant pain in B LEs, mainly feet and lower legs up to knee on left LE which got progressively worse since Saturday. Patient requires mod assist with bed mobility due to pain, yelling out when LEs touched. Patient attempted standing with RW, but is unable to bear weight to stand. Patient will benefit from continued PT while in hospital to improve functional mobility to return home.         Follow Up Recommendations SNF    Equipment Recommendations  Other (comment)(depends on progress)    Recommendations for Other Services       Precautions / Restrictions Precautions Precautions: Fall      Mobility  Bed Mobility Overal bed mobility: Needs Assistance Bed Mobility: Supine to Sit;Sit to Supine   Sidelying to sit: Mod assist Supine to sit: Mod assist Sit to supine: Min assist   General bed mobility comments: patient demonstrates limited bed mobility due to pain. Requires assistance bringing LEs onto/off bed, however patient is very tender to touch.   Transfers Overall transfer level: Needs assistance               General transfer comment: patient is unable to stand at all or bear weight through LEs due to pain.   Ambulation/Gait             General Gait Details: patient unable to attempt ambulation at this time due to pain. Unable to tolerate weight bearing.   Stairs            Wheelchair Mobility    Modified Rankin (Stroke Patients Only)       Balance Overall balance assessment: Mild deficits observed, not formally  tested                                           Pertinent Vitals/Pain Pain Assessment: Faces Faces Pain Scale: Hurts whole lot Pain Descriptors / Indicators: Moaning;Grimacing;Guarding;Tender;Sore;Aching Pain Intervention(s): Limited activity within patient's tolerance;Monitored during session    Home Living Family/patient expects to be discharged to:: Private residence Living Arrangements: Alone   Type of Home: House         Home Equipment: None      Prior Function Level of Independence: Independent         Comments: patient was completely independent prior to saturday. Driving, lives alone, does not use AD.      Hand Dominance        Extremity/Trunk Assessment   Upper Extremity Assessment Upper Extremity Assessment: Overall WFL for tasks assessed    Lower Extremity Assessment Lower Extremity Assessment: Overall WFL for tasks assessed       Communication   Communication: HOH  Cognition Arousal/Alertness: Awake/alert Behavior During Therapy: WFL for tasks assessed/performed Overall Cognitive Status: Within Functional Limits for tasks assessed  General Comments General comments (skin integrity, edema, etc.): patient has redness over dorsal and plantar sides of B feet. Warm to touch, painful when touched. Patient also has reddened area over left knee anteriorly which is also painful to touch and warm    Exercises Total Joint Exercises Ankle Circles/Pumps: AAROM(unable to perform on left)   Assessment/Plan    PT Assessment Patient needs continued PT services  PT Problem List Pain;Decreased mobility       PT Treatment Interventions Gait training;Functional mobility training;Therapeutic activities;Therapeutic exercise;Patient/family education    PT Goals (Current goals can be found in the Care Plan section)  Acute Rehab PT Goals Patient Stated Goal: patient wants to decrease  pain and return home at discharge PT Goal Formulation: With patient Time For Goal Achievement: 07/25/18 Potential to Achieve Goals: Fair    Frequency Min 2X/week   Barriers to discharge Decreased caregiver support unable to stand or walk currently due to pain    Co-evaluation               AM-PAC PT "6 Clicks" Daily Activity  Outcome Measure Difficulty turning over in bed (including adjusting bedclothes, sheets and blankets)?: Unable Difficulty moving from lying on back to sitting on the side of the bed? : Unable Difficulty sitting down on and standing up from a chair with arms (e.g., wheelchair, bedside commode, etc,.)?: Unable Help needed moving to and from a bed to chair (including a wheelchair)?: Total Help needed walking in hospital room?: Total Help needed climbing 3-5 steps with a railing? : Total 6 Click Score: 6    End of Session Equipment Utilized During Treatment: Gait belt Activity Tolerance: Patient limited by pain Patient left: in bed;with call bell/phone within reach Nurse Communication: Mobility status PT Visit Diagnosis: Pain;Other abnormalities of gait and mobility (R26.89);Difficulty in walking, not elsewhere classified (R26.2) Pain - Right/Left: Left Pain - part of body: Knee;Ankle and joints of foot    Time: 0930-1000 PT Time Calculation (min) (ACUTE ONLY): 30 min   Charges:   PT Evaluation $PT Eval Moderate Complexity: 1 Mod          Maryela Tapper, PT, GCS 07/11/18,10:24 AM

## 2018-07-11 NOTE — ED Provider Notes (Signed)
[facility] Emergency Department Provider Note  ____________________________________________   First MD Initiated Contact with Patient 07/11/18 0100     (approximate)  I have reviewed the triage vital signs and the nursing notes.   HISTORY  Chief Complaint Foot Pain    HPI [name] is a 84 y.o. 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  He presents by EMS for evaluation of gradually worsening and now severe bilateral foot pain.  He states that the pain started in his right foot about 3 days ago.  The following day the left foot became involved as well.  Yesterday he was not able to walk at all due to the pain in both feet and he also has a nodule below his left knee that is red and inflamed and painful.  He called EMS tonight because the pain was too severe.  Nothing particular makes the pain better and weightbearing and touching it makes it worse.  He denies fever/chills, chest pain, shortness of breath, nausea, vomiting, and abdominal pain.  He has no open wounds on his feet, has never had similar symptoms in the past, and does not go regularly to a podiatrist.  He has no history of gout, no new recent medications, no dietary changes recently.  Past Medical History:  Diagnosis Date  . Cancer ([place])   . Diabetes mellitus without complication [facility])     Patient Active Problem List   Diagnosis Date Noted  . Urinary urgency 06/27/2017  . Erectile dysfunction 06/27/2017  . Stage 3 acute kidney injury ([place]) 06/27/2017  . Hypogonadism in male 06/27/2017  . Vomiting 02/09/2016  . Slurred speech 02/09/2016  . Nausea and vomiting 02/09/2016  . Lower abdominal pain 02/09/2016  . Tobacco abuse 02/09/2016  . Essential hypertension 02/09/2016  . GERD (gastroesophageal reflux disease) 02/09/2016    Past Surgical History:  Procedure Laterality Date  . CARDIAC CATHETERIZATION     has a stent    Prior to Admission medications   Medication Sig Start Date End Date Taking? Authorizing Provider  aspirin EC 81 MG tablet Take 81 mg by mouth daily.    [provider]  Coenzyme Q10 (COQ-10 PO) Take 1 tablet by mouth daily.    [provider]  Cyanocobalamin (VITAMIN B-12 IJ) Inject 1 Dose as directed every 14 (fourteen) days. Next dose is 02/10/16    [provider]  doxazosin (CARDURA) 4 MG tablet Take 4 mg by mouth at bedtime.    [provider]  Multiple Vitamin (MULTIVITAMIN WITH MINERALS) TABS tablet Take 1 tablet by mouth daily.    [provider]  Omega-3 Fatty Acids (FISH OIL PO) Take 1 capsule by mouth daily.    [provider]  omeprazole (PRILOSEC) 20 MG capsule Take 20 mg by mouth daily.    [provider]  simvastatin (ZOCOR) 20 MG tablet Take 10 mg by mouth at bedtime.    [provider]  testosterone cypionate (DEPOTESTOTERONE CYPIONATE) 100 MG/ML injection Inject 100 mg into the muscle every 14 (fourteen) days. For IM use only    [provider]  verapamil (CALAN-SR) 240 MG CR tablet Take 240 mg by mouth 2 (two) times daily.    [provider]    Allergies Patient has no known allergies.  Family History  Problem Relation Age of Onset  . Prostate cancer Neg Hx   . Bladder Cancer  Neg Hx   . Kidney cancer Neg Hx     Social History Social History   Tobacco Use  . Smoking status: Current Every Day Smoker    Packs/day: 1.00    Types: Cigarettes  . Smokeless tobacco: Never Used  Substance Use Topics  . Alcohol use: Yes  . Drug use: No    Review of Systems Constitutional: No fever/chills Eyes: No visual changes. ENT: No sore throat. Cardiovascular: Denies chest pain. Respiratory: Denies shortness of breath. Gastrointestinal: No abdominal pain.  No nausea, no vomiting.  No diarrhea.  No constipation. Genitourinary: Negative for dysuria. Musculoskeletal: Severe  pain in bilateral feet as described above with redness and some swelling as well as a nodule below the left knee. Integumentary: No lacerations or wounds of which the patient is aware Neurological: Negative for headaches, focal weakness or numbness.   ____________________________________________   PHYSICAL EXAM:  VITAL SIGNS: ED Triage Vitals  Enc Vitals Group     BP 07/10/18 2306 (!) 148/111     Pulse Rate 07/10/18 2306 84     Resp 07/10/18 2306 17     Temp 07/10/18 2306 98.4 F (36.9 C)     Temp Source 07/10/18 2306 Oral     SpO2 07/10/18 2306 95 %     Weight 07/10/18 2304 80.7 kg (178 lb)     Height 07/10/18 2304 1.778 m (5\' 10" )     Head Circumference --      Peak Flow --      Pain Score 07/10/18 2304 10     Pain Loc --      Pain Edu? --      Excl. in [place]? --     Constitutional: Alert and oriented. Well appearing and in no acute distress.  Extremely hard of hearing. Eyes: Conjunctivae are normal.  Head: Atraumatic. Nose: No congestion/rhinnorhea. Mouth/Throat: Mucous membranes are moist. Neck: No stridor.  No meningeal signs.   Cardiovascular: Normal rate, regular rhythm. Good peripheral circulation. Grossly normal heart sounds. Respiratory: Normal respiratory effort.  No retractions. Lungs CTAB. Gastrointestinal: Soft and nontender. No distention.  Musculoskeletal: The patient has a firm erythematous and tender nodule just below his left patella with no surrounding cellulitis and no palpable fluid that could be drained.  There is no joint effusion.  He has bilateral swelling to both feet with some erythema along the midfoot of both feet but no podagra.  Both feet are exquisitely tender to palpation.  There is no erythema that extends up beyond the ankle and no evidence of swelling cellulitis.  The patient is status post right little toe amputation years ago. Neurologic:  Normal speech and language. No gross focal neurologic deficits are appreciated other than being very  hard of hearing. Skin:  Skin is warm, dry and intact.  Erythema to both feet as described above in musculoskeletal exam.  There are no open wounds. Psychiatric: Mood and affect are normal. Speech and behavior are normal.  ____________________________________________   LABS (all labs ordered are listed, but only abnormal results are displayed)  Labs Reviewed  BASIC METABOLIC PANEL - Abnormal; Notable for the following components:      Result Value   Glucose, Bld 123 (*)    BUN 29 (*)    Creatinine, Ser 1.59 (*)    Calcium 8.3 (*)    GFR calc non Af Amer 38 (*)    GFR calc Af Amer 44 (*)    All other components within normal limits  CBC - Abnormal; Notable for the following components:   WBC 13.0 (*)    RBC 4.08 (*)    HCT 39.4 (*)    All other components within normal limits  TROPONIN I  URIC ACID   ____________________________________________  EKG  No indication for EKG ____________________________________________  RADIOLOGY   ED MD interpretation:  no indication for imaging - radiographs unlikely to be of any benefit  Official radiology report(s): No results found.  ____________________________________________   PROCEDURES  Critical Care performed: No   Procedure(s) performed:   Procedures   ____________________________________________   INITIAL IMPRESSION / ASSESSMENT AND PLAN / ED COURSE  As part of my medical decision making, I reviewed the following data within the [place] notes reviewed and incorporated, Labs reviewed , Old chart reviewed, Patient signed out to Dr. Burlene Arnt, A consult was requested from this/these consultant(s) (Podiatry) and Notes from prior ED visits    Differential diagnosis includes, but is not limited to, gout (polyarticular), pseudogout, cellulitis.  The patient is vascularly intact and the fact that both feet are affected and simply the pattern of the erythema makes me feel that this is not  representative of cellulitis.  His vital signs are generally reassuring and he has no fever or tachycardia.  He does have a mild leukocytosis of 13.  Basic metabolic panel is consistent with priors and his creatinine is actually better than it has been in the past and he has a diagnosis of CKD stage III.  Troponin is negative.  Uric acid is pending.  Again, I believe that gout is the most likely diagnosis.  The patient's age and comorbidities including his chronic kidney disease make treatment somewhat difficult and I am a little bit worried about the bilateral nature and the fact that he cannot bear any weight on his feet.  I have called podiatry to discuss the case and see if they have any additional recommendations and if they feel that colchicine and/or corticosteroids would be appropriate or if they feel that it makes the most sense to keep the patient in the hospital for evaluation tomorrow by podiatry as well as PT OT.  Clinical Course as of Jul 11 728  Tue Jul 11, 2018  4010 I have paged Dr. Cleda Mccreedy twice but have not heard back.  Uric acid level is normal.   [CF]  0314 Since the patient is unable to ambulate and Dr. Cleda Mccreedy is not currently reachable, I have decided to keep the patient in the emergency department overnight.  In the morning podiatry should be contacted for an in-ED evaluation, and the patient should also receive PT/OT consultation to determine the appropriate level of care he will need.  At this point I do not feel that he meets inpatient treatment criteria, but he cannot go home if he is unable to bear any weight on either of his feet.Given that he has an elevated white blood cell count, redness and warmth in his extremities as described above, and his uric acid level is normal making gout less likely, I am going to begin treatment with Keflex 500 mg twice daily (given his creatinine clearance of 40) for cellulitis.  Once we have a podiatry opinion we can determine whether or not the  treatment should be continued as an outpatient.  I also began treatment for gout or pseudogout with colchicine 0.6 mg by mouth 3 times a day.   [CF]  [phone] Patient stable overnight.  Paged Dr. Cleda Mccreedy again  to discuss plan for ED consult for medication and management recommendations.   [CF]  0706 Still no call from Dr. Cleda Mccreedy.  Signing out ED care to Dr. Burlene Arnt.   [CF]    Clinical Course User Index [CF] Hinda Kehr, MD    ____________________________________________  FINAL CLINICAL IMPRESSION(S) / ED DIAGNOSES  Final diagnoses:  None     MEDICATIONS GIVEN DURING THIS VISIT:  Medications  colchicine tablet 0.6 mg (0.6 mg Oral Given 07/11/18 0406)  cephALEXin (KEFLEX) capsule 500 mg (500 mg Oral Given 07/11/18 0406)  doxazosin (CARDURA) tablet 4 mg (has no administration in time range)  verapamil (CALAN-SR) CR tablet 240 mg (has no administration in time range)     ED Discharge Orders    None       Note:  This document was prepared using Dragon voice recognition software and may include unintentional dictation errors.    Hinda Kehr, MD 07/11/18 [phone]

## 2018-07-11 NOTE — ED Notes (Signed)
Patient transported to room 145

## 2018-07-11 NOTE — Progress Notes (Signed)
Family Meeting Note  Advance Directive:yes  Today a meeting took place with the Patient.  Patient is able to participate   The following clinical team members were present during this meeting:MD  The following were discussed:Patient's diagnosis: Diabetes, cancer, gout, acute renal failure, Patient's progosis: Unable to determine and Goals for treatment: Full Code  Additional follow-up to be provided: prn  Time spent during discussion:20 minutes  Gorden Harms, MD

## 2018-07-12 ENCOUNTER — Other Ambulatory Visit: Payer: Self-pay

## 2018-07-12 LAB — BASIC METABOLIC PANEL
Anion gap: 9 (ref 5–15)
BUN: 38 mg/dL — AB (ref 8–23)
CHLORIDE: 107 mmol/L (ref 98–111)
CO2: 24 mmol/L (ref 22–32)
Calcium: 8.3 mg/dL — ABNORMAL LOW (ref 8.9–10.3)
Creatinine, Ser: 1.58 mg/dL — ABNORMAL HIGH (ref 0.61–1.24)
GFR calc Af Amer: 45 mL/min — ABNORMAL LOW (ref >60)
GFR calc non Af Amer: 38 mL/min — ABNORMAL LOW (ref >60)
GLUCOSE: 157 mg/dL — AB (ref 70–99)
POTASSIUM: 4 mmol/L (ref 3.5–5.1)
Sodium: 140 mmol/L (ref 135–145)

## 2018-07-12 LAB — GLUCOSE, CAPILLARY
GLUCOSE-CAPILLARY: 142 mg/dL — AB (ref 70–99)
Glucose-Capillary: 156 mg/dL — ABNORMAL HIGH (ref 70–99)

## 2018-07-12 MED ORDER — INFLUENZA VAC SPLIT QUAD 0.5 ML IM SUSY: 0.5000 mL | PREFILLED_SYRINGE | INTRAMUSCULAR | Status: DC

## 2018-07-12 MED ORDER — COLCHICINE 0.6 MG PO TABS: 0.6000 mg | ORAL_TABLET | Freq: Two times a day (BID) | ORAL | 0 refills | Status: DC

## 2018-07-12 MED ORDER — PREDNISONE 20 MG PO TABS: 20.0000 mg | ORAL_TABLET | Freq: Every day | ORAL | 0 refills | Status: DC

## 2018-07-12 NOTE — NC FL2 (Signed)
[place] LEVEL OF CARE SCREENING TOOL     IDENTIFICATION  Patient Name: George [name] Birthdate: 05-18-1932 Sex: male Admission Date (Current Location): 07/11/2018  [place] and [place] Number:  Engineering geologist and Address:  [facility], [address] [place], [place] [address]      Provider Number: [id number]  Attending Physician Name and Address:  Gorden Harms, MD  Relative Name and Phone Number:       Current Level of Care: Hospital Recommended Level of Care: [place] Prior Approval Number:    Date Approved/Denied:   PASRR Number: [id number])  Discharge Plan: SNF    Current Diagnoses: Patient Active Problem List   Diagnosis Date Noted  . Foot pain 07/11/2018  . Urinary urgency 06/27/2017  . Erectile dysfunction 06/27/2017  . Stage 3 acute kidney injury ([place]) 06/27/2017  . Hypogonadism in male 06/27/2017  . Vomiting 02/09/2016  . Slurred speech 02/09/2016  . Nausea and vomiting 02/09/2016  . Lower abdominal pain 02/09/2016  . Tobacco abuse 02/09/2016  . Essential hypertension 02/09/2016  . GERD (gastroesophageal reflux disease) 02/09/2016    Orientation RESPIRATION BLADDER Height & Weight     Self, Time, Situation, Place  Normal Continent Weight: 178 lb (80.7 kg) Height:  5\' 10"  (177.8 cm)  BEHAVIORAL SYMPTOMS/MOOD NEUROLOGICAL BOWEL NUTRITION STATUS      Continent Diet(Diet: Heart Healthy/ Carb Modified. )  AMBULATORY STATUS COMMUNICATION OF NEEDS Skin   Extensive Assist Verbally Normal                       Personal Care Assistance Level of Assistance  Bathing, Feeding, Dressing Bathing Assistance: Limited assistance Feeding assistance: Independent Dressing Assistance: Limited assistance     Functional Limitations Info  Sight, Hearing, Speech Sight Info: Adequate Hearing Info: Adequate Speech Info: Adequate    SPECIAL CARE FACTORS FREQUENCY  PT (By licensed PT), OT  (By licensed OT)     PT Frequency: (5) OT Frequency: (5)            Contractures      Additional Factors Info  Code Status, Allergies Code Status Info: (Full Code. ) Allergies Info: (No Known Allergies. )           Current Medications (07/12/2018):  This is the current hospital active medication list Current Facility-Administered Medications  Medication Dose Route Frequency Provider Last Rate Last Dose  . 0.45 % sodium chloride infusion   Intravenous Continuous Salary, Montell D, MD 50 mL/hr at 07/11/18 1549    . 0.9 %  sodium chloride infusion  250 mL Intravenous PRN Salary, Montell D, MD      . acetaminophen (TYLENOL) tablet 650 mg  650 mg Oral Q6H PRN Salary, Montell D, MD       Or  . acetaminophen (TYLENOL) suppository 650 mg  650 mg Rectal Q6H PRN Salary, Montell D, MD      . cephALEXin (KEFLEX) capsule 500 mg  500 mg Oral Q12H Hinda Kehr, MD   500 mg at 07/12/18 0314  . colchicine tablet 0.6 mg  0.6 mg Oral TID Hinda Kehr, MD   0.6 mg at 07/12/18 0848  . colchicine tablet 0.6 mg  0.6 mg Oral BID Salary, Montell D, MD      . cyanocobalamin ((VITAMIN B-12)) injection 1,000 mcg  1,000 mcg Intramuscular Q30 days Salary, Montell D, MD      . docusate sodium (COLACE) capsule 100 mg  100 mg Oral BID Loney Hering D, MD   100 mg at 07/12/18 0849  . doxazosin (CARDURA) tablet 4 mg  4 mg Oral QHS McShane, James A, MD      . enoxaparin (LOVENOX) injection 40 mg  40 mg Subcutaneous Q24H Salary, Montell D, MD   40 mg at 07/11/18 1700  . hydrALAZINE (APRESOLINE) injection 10 mg  10 mg Intravenous Q4H PRN Salary, Montell D, MD      . HYDROcodone-acetaminophen (NORCO/VICODIN) 5-325 MG per tablet 1-2 tablet  1-2 tablet Oral Q4H PRN Salary, Montell D, MD      . indomethacin (INDOCIN) capsule 50 mg  50 mg Oral TID WC Salary, Montell D, MD   50 mg at 07/12/18 0849  . insulin aspart (novoLOG) injection 0-20 Units  0-20 Units Subcutaneous TID WC Salary, Holly Bodily D, MD   3 Units at  07/11/18 1238  . insulin aspart (novoLOG) injection 0-5 Units  0-5 Units Subcutaneous QHS Salary, Montell D, MD      . ondansetron (ZOFRAN) tablet 4 mg  4 mg Oral Q6H PRN Salary, Montell D, MD       Or  . ondansetron (ZOFRAN) injection 4 mg  4 mg Intravenous Q6H PRN Salary, Montell D, MD      . pantoprazole (PROTONIX) EC tablet 40 mg  40 mg Oral Daily Salary, Montell D, MD   40 mg at 07/12/18 0848  . polyethylene glycol (MIRALAX / GLYCOLAX) packet 17 g  17 g Oral Daily PRN Salary, Montell D, MD      . predniSONE (DELTASONE) tablet 20 mg  20 mg Oral Q breakfast Salary, Montell D, MD   20 mg at 07/12/18 0849  . simvastatin (ZOCOR) tablet 10 mg  10 mg Oral QHS Salary, Holly Bodily D, MD   10 mg at 07/11/18 2127  . sodium chloride flush (NS) 0.9 % injection 3 mL  3 mL Intravenous Q12H Salary, Montell D, MD   3 mL at 07/12/18 0848  . sodium chloride flush (NS) 0.9 % injection 3 mL  3 mL Intravenous PRN Salary, Montell D, MD      . verapamil (CALAN-SR) CR tablet 240 mg  240 mg Oral BID Schuyler Amor, MD   240 mg at 07/12/18 2257     Discharge Medications: Please see discharge summary for a list of discharge medications.  Relevant Imaging Results:  Relevant Lab Results:   Additional Information (SSN: [id number])  Jeiden Daughtridge, Veronia Beets, LCSW

## 2018-07-12 NOTE — Progress Notes (Signed)
Patient worked with PT today and walked around the nurses station. PT is recommending home health. Patient and his friend Carloyn Manner are agreeable to home health. RN case manager aware of above.Please reconsult if future social work needs arise. CSW signing off.   [employer], LCSW [phone]

## 2018-07-12 NOTE — Progress Notes (Signed)
Physical Therapy Treatment Patient Details Name: George [name] MRN: [id number] DOB: 1932/01/29 Today's Date: 07/12/2018    History of Present Illness Patient is an 84 year old male admitted to ED with pain in B LEs, unable to bear weight. Patient lives alone at baseline and does not use AD. PMH to include: DM, Cancer, PVD.    PT Comments    Patient with significant progress related to pain control and tolerance/performance of functional mobility tasks this date.  Reports pain fully resolved, and demonstrates ability to complete all transfers, gait (200') and stairs (up/down 4 with bilat rails), with close sup/mod indep. Patient voices comfort with functional performance and feels comfortable returning home.   Given noted improvement, discharge recommendations updated to reflect progress (home with HHPT).  Patient/friend at bedside voiced understanding.   Follow Up Recommendations  Home health PT     Equipment Recommendations       Recommendations for Other Services       Precautions / Restrictions Precautions Precautions: Fall Restrictions Weight Bearing Restrictions: No    Mobility  Bed Mobility Overal bed mobility: Modified Independent                Transfers Overall transfer level: Modified independent Equipment used: None Transfers: Sit to/from Stand              Ambulation/Gait Ambulation/Gait assistance: Supervision Gait Distance (Feet): 200 Feet Assistive device: None       General Gait Details: reciprocal stepping pattern; mild sway/gait deviation with head turns and changes of direction, LE step strategy for recovery   Stairs Stairs: Yes Stairs assistance: Supervision Stair Management: Two rails Number of Stairs: 4 General stair comments: reciprocal stepping pattern, ascending forward/descending backwards (patient preference); good control and stability   Wheelchair Mobility    Modified Rankin (Stroke Patients Only)        Balance Overall balance assessment: Needs assistance Sitting-balance support: No upper extremity supported;Feet supported Sitting balance-Leahy Scale: Good     Standing balance support: No upper extremity supported Standing balance-Leahy Scale: Fair                              Cognition Arousal/Alertness: Awake/alert Behavior During Therapy: WFL for tasks assessed/performed Overall Cognitive Status: Within Functional Limits for tasks assessed                                        Exercises      General Comments        Pertinent Vitals/Pain Pain Assessment: No/denies pain    Home Living                      Prior Function            PT Goals (current goals can now be found in the care plan section) Acute Rehab PT Goals Patient Stated Goal: patient wants to decrease pain and return home at discharge PT Goal Formulation: With patient Time For Goal Achievement: 07/25/18 Potential to Achieve Goals: Fair Progress towards PT goals: Progressing toward goals    Frequency    Min 2X/week      PT Plan Current plan remains appropriate    Co-evaluation              AM-PAC PT "6 Clicks" Daily Activity  Outcome Measure  Difficulty turning over in bed (including adjusting bedclothes, sheets and blankets)?: None Difficulty moving from lying on back to sitting on the side of the bed? : None Difficulty sitting down on and standing up from a chair with arms (e.g., wheelchair, bedside commode, etc,.)?: None Help needed moving to and from a bed to chair (including a wheelchair)?: A Little Help needed walking in hospital room?: A Little Help needed climbing 3-5 steps with a railing? : A Little 6 Click Score: 21    End of Session Equipment Utilized During Treatment: Gait belt Activity Tolerance: Patient tolerated treatment well Patient left: in bed;with call bell/phone within reach;with bed alarm set;with family/visitor  present Nurse Communication: Mobility status PT Visit Diagnosis: Pain;Other abnormalities of gait and mobility (R26.89);Difficulty in walking, not elsewhere classified (R26.2) Pain - Right/Left: Left Pain - part of body: Knee;Ankle and joints of foot     Time: 9476-5465 PT Time Calculation (min) (ACUTE ONLY): 18 min  Charges:  $Gait Training: 8-22 mins                     Genea Rheaume H. Owens Shark, PT, DPT, NCS 07/12/18, 1:20 PM [phone]

## 2018-07-12 NOTE — Clinical Social Work Placement (Signed)
   CLINICAL SOCIAL WORK PLACEMENT  NOTE  Date:  07/12/2018  Patient Details  Name: George [name] MRN: [id number] Date of Birth: 1932-01-23  Clinical Social Work is seeking post-discharge placement for this patient at the [place] level of care (*CSW will initial, date and re-position this form in  chart as items are completed):  Yes   Patient/family provided with [place] Work Department's list of facilities offering this level of care within the geographic area requested by the patient (or if unable, by the patient's family).  Yes   Patient/family informed of their freedom to choose among providers that offer the needed level of care, that participate in Medicare, Medicaid or managed care program needed by the patient, have an available bed and are willing to accept the patient.  Yes   Patient/family informed of [place]'s ownership interest in [facility] and [facility], as well as of the fact that they are under no obligation to receive care at these facilities.  PASRR submitted to EDS on 07/12/18     PASRR number received on 07/12/18     Existing PASRR number confirmed on       FL2 transmitted to all facilities in geographic area requested by pt/family on 07/12/18     FL2 transmitted to all facilities within larger geographic area on       Patient informed that his/her managed care company has contracts with or will negotiate with certain facilities, including the following:        Yes   Patient/family informed of bed offers received.  Patient chooses bed at [facility] )     Physician recommends and patient chooses bed at      Patient to be transferred to   on  .  Patient to be transferred to facility by       Patient family notified on   of transfer.  Name of family member notified:        PHYSICIAN       Additional Comment:    _______________________________________________ Livi Mcgann, Veronia Beets,  LCSW 07/12/2018, 11:22 AM

## 2018-07-12 NOTE — Clinical Social Work Note (Signed)
Clinical Social Work Assessment  Patient Details  Name: George [name] MRN: [id number] Date of Birth: [birth year]  Date of referral:  07/12/18               Reason for consult:  Facility Placement                Permission sought to share information with:  Chartered certified accountant granted to share information::  Yes, Verbal Permission Granted  Name::      [place]::   [place]   Relationship::     Contact Information:     Housing/Transportation Living arrangements for the past 2 months:  [place] of Information:  Patient, Friend/Neighbor Patient Interpreter Needed:  None Criminal Activity/Legal Involvement Pertinent to Current Situation/Hospitalization:  No - Comment as needed Significant Relationships:  Friend Lives with:  Self Do you feel safe going back to the place where you live?  Yes Need for family participation in patient care:  Yes (Comment)  Care giving concerns:  Patient lives in [place] alone.    Social Worker assessment / plan:  Holiday representative (CSW) reviewed chart and noted that PT is recommending SNF. CSW met with patient and his friend George [name] [phone] was at bedside. Patient was hard of hearing and his friend helped during assessment. Per George [name] he has been friends with patient for 45 years. Per George [name] patient lives alone in [place] and patient's wife George [name] passed away 20 years ago. Per George [name] patient's oldest son has passed away and patient's youngest son lives in [place] and has not talked to his father in 10 years. Per George [name] he is concerned about patient going home and would like for him to go to rehab for a little bit. Patient is also agreeable to rehab. Patient is agreeable to SNF search in [facility]. FL2 complete and faxed out. CSW also explained that [facility] will have to approve SNF.   CSW presented bed offers to patient and he chose [facility]. CSW received [facility] SNF authorization, authorization # [id number]. Kelly admissions coordinaotr at [facility] is aware of above.   Employment status:  Retired Nurse, adult PT Recommendations:  [place] / Referral to community resources:  [place]  Patient/Family's Response to care:  Patient is agreeable SNF referral being sent.   Patient/Family's Understanding of and Emotional Response to Diagnosis, Current Treatment, and Prognosis:  Patient was very pleasant and thanked CSW for assistance.   Emotional Assessment Appearance:  Appears stated age Attitude/Demeanor/Rapport:    Affect (typically observed):  Accepting, Adaptable, Pleasant Orientation:  Oriented to Self, Oriented to Place, Oriented to  Time, Oriented to Situation Alcohol / Substance use:  Not Applicable Psych involvement (Current and /or in the community):  No (Comment)  Discharge Needs  Concerns to be addressed:  Discharge Planning Concerns Readmission within the last 30 days:  No Current discharge risk:  Dependent with Mobility Barriers to Discharge:  Continued Medical Work up   [employer], Veronia Beets, LCSW 07/12/2018, 11:23 AM

## 2018-07-12 NOTE — Care Management Obs Status (Signed)
[place] NOTIFICATION   Patient Details  Name: ROMOLO SIELING MRN: [id number] Date of Birth: 1932/03/26   Medicare Observation Status Notification Given:  Yes    Jolly Mango, RN 07/12/2018, 1:48 PM

## 2018-07-12 NOTE — Care Management Note (Signed)
Case Management Note  Patient Details  Name: George [name] MRN: [id number] Date of Birth: 07/31/1932  Subjective/Objective:  Met with patient  And his 2 friends at bedside. One friend is Roy that assist patient with bills and other needs. Patient very HOH and defers me to Roy . He does not have his hearing aids in at this time. Offered a list of home health agencies. [facility]. Jason in to speak with Roy. No DME needed. PCP is Dr. Bronstein.     Action/Plan:   Expected Discharge Date:  07/12/18               Expected Discharge Plan:  Home w Home Health Services  In-House Referral:  Clinical Social Work  Discharge planning Services  CM Consult  Post Acute Care Choice:  Home Health Choice offered to:  (Friend/ Roy)  DME Arranged:    DME Agency:     HH Arranged:  PT HH Agency:  [facility]  Status of Service:  Completed, signed off  If discussed at Long Length of Stay Meetings, dates discussed:    Additional Comments:   M , RN 07/12/2018, 2:01 PM  

## 2018-07-12 NOTE — Discharge Summary (Signed)
[place] [facility] [place] NAME: George [name]    MR#:  [id number]  DATE OF BIRTH:  07/13/1932  DATE OF ADMISSION:  07/11/2018 ADMITTING PHYSICIAN: Avel Peace Salary, MD  DATE OF DISCHARGE: No discharge date for patient encounter.  PRIMARY CARE PHYSICIAN: Juluis Pitch, MD    ADMISSION DIAGNOSIS:  Pain [R52] Cellulitis, unspecified cellulitis site [G64.40]  DISCHARGE DIAGNOSIS:  Active Problems:   Foot pain   SECONDARY DIAGNOSIS:   Past Medical History:  Diagnosis Date  . Cancer ([place])   . Diabetes mellitus without complication [facility])     HOSPITAL COURSE:  *Acute polyarticular joint pain Most likely secondary to acute polyarticular gout attack Resolved  Treated with colchicine twice daily, short course of Indocin, IV Solu-Medrol x1, received education while in house, seen by podiatry, and patient did well   *Chronic diabetes mellitus type 2 Held metformin while in house, treated with sliding scale insulin with Accu-Cheks per routine with carbohydrate consistent diet   *Chronic peripheral vascular disease Stable Conservative medical management  *Chronic benign essential hypertension Controlled on current regiment  *Chronic hyperlipidemia, unspecified Statin therapy   DISCHARGE CONDITIONS:   Improved  CONSULTS OBTAINED:    DRUG ALLERGIES:  No Known Allergies  DISCHARGE MEDICATIONS:   Allergies as of 07/12/2018   No Known Allergies     Medication List    TAKE these medications   colchicine 0.6 MG tablet Take 1 tablet (0.6 mg total) by mouth 2 (two) times daily.   cyanocobalamin 1000 MCG/ML injection Commonly known as:  (VITAMIN B-12) Inject 1,000 mcg into the muscle every 30 (thirty) days.   doxazosin 4 MG tablet Commonly known as:  CARDURA Take 4 mg by mouth at bedtime.   hydrochlorothiazide 25 MG tablet Commonly known as:  HYDRODIURIL Take 25 mg by mouth daily.   lisinopril 20 MG  tablet Commonly known as:  PRINIVIL,ZESTRIL Take 20 mg by mouth 2 (two) times daily.   metFORMIN 750 MG 24 hr tablet Commonly known as:  GLUCOPHAGE-XR Take 750 mg by mouth 2 (two) times daily.   omeprazole 20 MG capsule Commonly known as:  PRILOSEC Take 20 mg by mouth daily.   predniSONE 20 MG tablet Commonly known as:  DELTASONE Take 1 tablet (20 mg total) by mouth daily with breakfast. Start taking on:  07/13/2018   sildenafil 20 MG tablet Commonly known as:  REVATIO Take 40-100 mg by mouth as needed (for ED).   simvastatin 20 MG tablet Commonly known as:  ZOCOR Take 10 mg by mouth at bedtime.   testosterone cypionate 200 MG/ML injection Commonly known as:  DEPOTESTOSTERONE CYPIONATE Inject 200 mg into the muscle every 14 (fourteen) days.   verapamil 240 MG CR tablet Commonly known as:  CALAN-SR Take 240 mg by mouth 2 (two) times daily.        DISCHARGE INSTRUCTIONS:  If you experience worsening of your admission symptoms, develop shortness of breath, life threatening emergency, suicidal or homicidal thoughts you must seek medical attention immediately by calling 911 or calling your MD immediately  if symptoms less severe.  You Must read complete instructions/literature along with all the possible adverse reactions/side effects for all the Medicines you take and that have been prescribed to you. Take any new Medicines after you have completely understood and accept all the possible adverse reactions/side effects.   Please note  You were cared for by a hospitalist during your hospital stay. If you have any questions  about your discharge medications or the care you received while you were in the hospital after you are discharged, you can call the unit and asked to speak with the hospitalist on call if the hospitalist that took care of you is not available. Once you are discharged, your primary care physician will handle any further medical issues. Please note that NO  REFILLS for any discharge medications will be authorized once you are discharged, as it is imperative that you return to your primary care physician (or establish a relationship with a primary care physician if you do not have one) for your aftercare needs so that they can reassess your need for medications and monitor your lab values.    Today   CHIEF COMPLAINT:   Chief Complaint  Patient presents with  . Foot Pain    HISTORY OF PRESENT ILLNESS:  84 y.o. male with a known history of per below which also includes diabetes, cancer, peripheral vascular disease, presenting with 3-day history of worsening pain in his lower extremities, started to have right foot pain on Saturday-could not walk on it, began to favor his left foot, left foot became painful as well, left knee began to hurt as well, pain very intense 9/10 in terms of intensity, unable to stand/ambulate, in the emergency room work-up noted for creatinine 1.5, white count 13, uric acid level was high normal, patient denies any history of trauma/gout, pain made better with IV pain medicine/colchicine given in the emergency room, patient is now been admitted to regular nursing for bed on observation for acute probable poly-articular gouty attack. VITAL SIGNS:  Blood pressure 110/68, pulse 87, temperature 98.3 F (36.8 C), temperature source Oral, resp. rate 18, height 5\' 10"  (1.778 m), weight 80.7 kg, SpO2 95 %.  I/O:    Intake/Output Summary (Last 24 hours) at 07/12/2018 1132 Last data filed at 07/12/2018 1022 Gross per 24 hour  Intake 948.43 ml  Output -  Net 948.43 ml    PHYSICAL EXAMINATION:  GENERAL:  84 y.o.-year-old patient lying in the bed with no acute distress.  EYES: Pupils equal, round, reactive to light and accommodation. No scleral icterus. Extraocular muscles intact.  HEENT: Head atraumatic, normocephalic. Oropharynx and nasopharynx clear.  NECK:  Supple, no jugular venous distention. No thyroid enlargement, no  tenderness.  LUNGS: Normal breath sounds bilaterally, no wheezing, rales,rhonchi or crepitation. No use of accessory muscles of respiration.  CARDIOVASCULAR: S1, S2 normal. No murmurs, rubs, or gallops.  ABDOMEN: Soft, non-tender, non-distended. Bowel sounds present. No organomegaly or mass.  EXTREMITIES: No pedal edema, cyanosis, or clubbing.  NEUROLOGIC: Cranial nerves II through XII are intact. Muscle strength 5/5 in all extremities. Sensation intact. Gait not checked.  PSYCHIATRIC: The patient is alert and oriented x 3.  SKIN: No obvious rash, lesion, or ulcer.   DATA REVIEW:   CBC Recent Labs  Lab 07/10/18 2311  WBC 13.0*  HGB 13.6  HCT 39.4*  PLT 169    Chemistries  Recent Labs  Lab 07/12/18 0405  NA 140  K 4.0  CL 107  CO2 24  GLUCOSE 157*  BUN 38*  CREATININE 1.58*  CALCIUM 8.3*    Cardiac Enzymes Recent Labs  Lab 07/10/18 2311  TROPONINI <0.03    Microbiology Results  Results for orders placed or performed during the hospital encounter of 02/08/16  Urine culture     Status: Abnormal   Collection Time: 02/09/16  7:52 AM  Result Value Ref Range Status   Specimen Description  URINE, CLEAN CATCH  Final   Special Requests Normal  Final   Culture MULTIPLE SPECIES PRESENT, SUGGEST RECOLLECTION (A)  Final   Report Status 02/11/2016 FINAL  Final    RADIOLOGY:  Dg Foot 2 Views Left  Result Date: 07/11/2018 CLINICAL DATA:  History of diabetes, worsening pain in the lower extremities over the last 3 days EXAM: LEFT FOOT - 2 VIEW COMPARISON:  None. FINDINGS: There is some discontinuity of the cortical margin of the distal portion of the proximal phalanx of the left fifth toe. Although no definite linear fracture is evident this could be due to either acute or old fracture final favoring old fracture. Tarsal-metatarsal alignment is normal. Mild degenerative changes present in the midfoot and a small plantar calcaneal degenerative spur is noted. Arterial  calcifications are present. IMPRESSION: 1. No definite acute fracture. 2. Probable old fracture of the distal aspect of the proximal phalanx of the left fifth toe as noted above. 3. Mild degenerative changes. Electronically Signed   By: Ivar Drape M.D.   On: 07/11/2018 08:45   Dg Foot 2 Views Right  Result Date: 07/11/2018 CLINICAL DATA:  History of diabetes, right foot pain for several days EXAM: RIGHT FOOT - 2 VIEW COMPARISON:  None. FINDINGS: The patient appears to have undergone prior amputation of the right fifth toe. However, there is cortical erosion of the distal phalanx of the fourth toe and possible focal demineralization of the distal remaining portion of the fifth metatarsal laterally, and active osteomyelitis cannot be excluded. Clinical correlation is recommended. No other abnormality is seen. IMPRESSION: Apparent erosion of the a portion of the distal phalanx of the fourth toe, and possibly a remaining portion of the tiny remaining proximal phalanx and very distal portion of the fifth metatarsal, suspicious for osteomyelitis. Correlate clinically. Electronically Signed   By: Ivar Drape M.D.   On: 07/11/2018 09:05    EKG:   Orders placed or performed during the hospital encounter of 02/08/16  . ED EKG  . ED EKG  . EKG      Management plans discussed with the patient, family and they are in agreement.  CODE STATUS:     Code Status Orders  (From admission, onward)         Start     Ordered   07/11/18 1346  Full code  Continuous     07/11/18 1345        Code Status History    Date Active Date Inactive Code Status Order ID Comments User Context   02/09/2016 0036 02/09/2016 1914 Full Code [id number]  Norval Morton, MD ED      TOTAL TIME TAKING CARE OF THIS PATIENT: 45 minutes.    Avel Peace Salary M.D on 07/12/2018 at 11:32 AM  Between 7am to 6pm - Pager - [phone]  After 6pm go to [email or web address] - password [email or web address] [facility]  Office   [phone]  CC: Primary care physician; Juluis Pitch, MD    Note: This dictation was prepared with Dragon dictation along with smaller phrase technology. Any transcriptional errors that result from this process are unintentional.

## 2018-07-12 NOTE — Plan of Care (Signed)
Nutrition Education note  RD received consult for gout education.   Met with pt and pt's close friends in room today. Pt is very hard of hearing so unable to communicate well with pt to provide education. Pt requesting paperwork that he can read over as he reports he does not have hearing aide batteries. RD provided education to pt's friends at bedside who apparently shop with pt and help him obtain groceries. Apparently, pt is a very picky eater, loves meats and sweets and has a tendency to hoard up food and canned items. Explained what gout is and what the factors are that cause it. Provided a list of low, moderate and high purine containing foods and advised pt to limit foods high in purine but also to may sure he was getting protein from other sources to avoid muscle loss. Apparently, this is the first time the patient has ever had a gout flare. Recommend food diary to try and identify foods that cause these flares. Pt expresses understanding and reports that he will read over educational materials.   Pt eating lunch at time of RD visit; ate 75% of his meal.   No further nutritional needs; please re-consult if something else is needed  Koleen Distance MS, RD, [place] Pager #- [phone] Office#- [phone] After Hours Pager: [phone]

## 2018-07-23 ENCOUNTER — Emergency Department
Admission: EM | Admit: 2018-07-23 | Discharge: 2018-07-23 | Disposition: A | Discharge status: Home / Self Care | Payer: Medicare Other | Attending: Emergency Medicine | Admitting: Emergency Medicine

## 2018-07-23 ENCOUNTER — Encounter: Payer: Self-pay | Admitting: Emergency Medicine

## 2018-07-23 ENCOUNTER — Other Ambulatory Visit: Payer: Self-pay

## 2018-07-23 DIAGNOSIS — R339 Retention of urine, unspecified: Secondary | ICD-10-CM | POA: Insufficient documentation

## 2018-07-23 DIAGNOSIS — Z79899 Other long term (current) drug therapy: Secondary | ICD-10-CM | POA: Diagnosis not present

## 2018-07-23 DIAGNOSIS — R1030 Lower abdominal pain, unspecified: Secondary | ICD-10-CM | POA: Diagnosis not present

## 2018-07-23 DIAGNOSIS — F17210 Nicotine dependence, cigarettes, uncomplicated: Secondary | ICD-10-CM | POA: Diagnosis not present

## 2018-07-23 DIAGNOSIS — E119 Type 2 diabetes mellitus without complications: Secondary | ICD-10-CM | POA: Diagnosis not present

## 2018-07-23 DIAGNOSIS — Z859 Personal history of malignant neoplasm, unspecified: Secondary | ICD-10-CM | POA: Insufficient documentation

## 2018-07-23 DIAGNOSIS — Z7984 Long term (current) use of oral hypoglycemic drugs: Secondary | ICD-10-CM | POA: Insufficient documentation

## 2018-07-23 DIAGNOSIS — I10 Essential (primary) hypertension: Secondary | ICD-10-CM | POA: Insufficient documentation

## 2018-07-23 LAB — URINALYSIS, ROUTINE W REFLEX MICROSCOPIC
BILIRUBIN URINE: NEGATIVE
Bacteria, UA: NONE SEEN
GLUCOSE, UA: NEGATIVE mg/dL
KETONES UR: NEGATIVE mg/dL
LEUKOCYTES UA: NEGATIVE
NITRITE: NEGATIVE
PH: 5 (ref 5.0–8.0)
Protein, ur: NEGATIVE mg/dL
SQUAMOUS EPITHELIAL / LPF: NONE SEEN (ref 0–5)
Specific Gravity, Urine: 1.012 (ref 1.005–1.030)

## 2018-07-23 NOTE — ED Provider Notes (Signed)
[facility] Emergency Department Provider Note  Time seen: 7:57 AM  I have reviewed the triage vital signs and the nursing notes.   HISTORY  Chief Complaint Urinary Retention    HPI George [name] is a 84 y.o. male with a past medical history of diabetes, nausea, vomiting, gastric reflux, hypertension, presents to the emergency department for urinary retention.  According to the patient he attempted to urinate last night but was unable to do so, woke this morning still unable to urinate with significant lower abdominal pain and distention.  Denies any history of urinary retention in the past.  Denies any dysuria or hematuria.  Denies any fever.    Past Medical History:  Diagnosis Date  . Cancer ([place])   . Diabetes mellitus without complication [facility])     Patient Active Problem List   Diagnosis Date Noted  . Foot pain 07/11/2018  . Urinary urgency 06/27/2017  . Erectile dysfunction 06/27/2017  . Stage 3 acute kidney injury ([place]) 06/27/2017  . Hypogonadism in male 06/27/2017  . Vomiting 02/09/2016  . Slurred speech 02/09/2016  . Nausea and vomiting 02/09/2016  . Lower abdominal pain 02/09/2016  . Tobacco abuse 02/09/2016  . Essential hypertension 02/09/2016  . GERD (gastroesophageal reflux disease) 02/09/2016    Past Surgical History:  Procedure Laterality Date  . CARDIAC CATHETERIZATION     has a stent    Prior to Admission medications   Medication Sig Start Date End Date Taking? Authorizing Provider  colchicine 0.6 MG tablet Take 1 tablet (0.6 mg total) by mouth 2 (two) times daily. 07/12/18   Salary, Avel Peace, MD  cyanocobalamin (,VITAMIN B-12,) 1000 MCG/ML injection Inject 1,000 mcg into the muscle every 30 (thirty) days.    [provider]  doxazosin (CARDURA) 4 MG tablet Take 4 mg by mouth at bedtime.    [provider]  hydrochlorothiazide (HYDRODIURIL) 25 MG tablet Take 25 mg by mouth daily.    [provider]   lisinopril (PRINIVIL,ZESTRIL) 20 MG tablet Take 20 mg by mouth 2 (two) times daily.    [provider]  metFORMIN (GLUCOPHAGE-XR) 750 MG 24 hr tablet Take 750 mg by mouth 2 (two) times daily.    [provider]  omeprazole (PRILOSEC) 20 MG capsule Take 20 mg by mouth daily.    [provider]  predniSONE (DELTASONE) 20 MG tablet Take 1 tablet (20 mg total) by mouth daily with breakfast. 07/13/18   Salary, Holly Bodily D, MD  sildenafil (REVATIO) 20 MG tablet Take 40-100 mg by mouth as needed (for ED).     [provider]  simvastatin (ZOCOR) 20 MG tablet Take 10 mg by mouth at bedtime.    [provider]  testosterone cypionate (DEPOTESTOSTERONE CYPIONATE) 200 MG/ML injection Inject 200 mg into the muscle every 14 (fourteen) days.    [provider]  verapamil (CALAN-SR) 240 MG CR tablet Take 240 mg by mouth 2 (two) times daily.    [provider]    No Known Allergies  Family History  Problem Relation Age of Onset  . Prostate cancer Neg Hx   . Bladder Cancer Neg Hx   . Kidney cancer Neg Hx     Social History Social History   Tobacco Use  . Smoking status: Current Every Day Smoker    Packs/day: 1.00    Types: Cigarettes  . Smokeless tobacco: Never Used  Substance Use Topics  . Alcohol use: Yes  . Drug use: No  Review of Systems Constitutional: Negative for fever Cardiovascular: Negative for chest pain. Respiratory: Negative for shortness of breath. Gastrointestinal: Lower abdominal pain and distention Genitourinary: Unable to urinate Musculoskeletal: Recent gout in his feet, now resolved Skin: Negative for skin complaints  Neurological: Negative for headache All other ROS negative  ____________________________________________   PHYSICAL EXAM:  VITAL SIGNS: ED Triage Vitals  Enc Vitals Group     BP --      Pulse Rate 07/23/18 0748 96     Resp --      Temp 07/23/18 0748 98.4 F (36.9 C)     Temp Source  07/23/18 0748 Oral     SpO2 07/23/18 0748 95 %     Weight 07/23/18 0747 178 lb (80.7 kg)     Height 07/23/18 0747 5\' 10"  (1.778 m)     Head Circumference --      Peak Flow --      Pain Score 07/23/18 0747 10     Pain Loc --      Pain Edu? --      Excl. in [place]? --    Constitutional: Alert and oriented. Well appearing and in no distress. Eyes: Normal exam ENT   Head: Normocephalic and atraumatic.   Mouth/Throat: Mucous membranes are moist. Cardiovascular: Normal rate, regular rhythm. No murmur Respiratory: Normal respiratory effort without tachypnea nor retractions. Breath sounds are clear Gastrointestinal: Soft and nontender. No distention.  (Status post Foley insertion) Musculoskeletal: Nontender with normal range of motion in all extremities.  Pedal edema bilaterally. Neurologic:  Normal speech and language. No gross focal neurologic deficits Skin:  Skin is warm, dry and intact.  Psychiatric: Mood and affect are normal.    INITIAL IMPRESSION / ASSESSMENT AND PLAN / ED COURSE  Pertinent labs & imaging results that were available during my care of the patient were reviewed by me and considered in my medical decision making (see chart for details).  Patient presents to the emergency department for inability to urinate, lower abdominal pain and distention.  Patient unable to urinate since last night presents to the emergency department with significant pain and distention in his lower abdomen.  Foley catheter placed with greater than 500 cc of output and still draining.  We will send a urinalysis/urine culture.  Overall the patient appears well.  States the pain is completely gone after the Foley catheter was inserted.  Anticipate transitioning to a leg bag and follow-up with urology in 7 to 10 days for Foley removal.  Patient agreeable to plan of care.  We will check urinalysis prior to discharge.  Overall the patient appears well, reassuring no fever.  Completely benign abdomen  status post Foley insertion.  Urinalysis is negative.  We will transition to a leg bag and discharged home with urology follow-up in 7 to 10 days for recheck/reevaluation of Foley removal.  Patient agreeable to plan of care.  ____________________________________________   FINAL CLINICAL IMPRESSION(S) / ED DIAGNOSES  Acute urinary retention    Harvest Dark, MD 07/23/18 [phone]

## 2018-07-23 NOTE — ED Triage Notes (Signed)
Here for urinary retention. Unsure last time urinated. Pt very hard of hearing. Pain to bladder area.

## 2018-07-24 LAB — URINE CULTURE: Culture: NO GROWTH

## 2018-07-31 ENCOUNTER — Ambulatory Visit (INDEPENDENT_AMBULATORY_CARE_PROVIDER_SITE_OTHER): Payer: Medicare Other | Admitting: Family Medicine

## 2018-07-31 DIAGNOSIS — R3915 Urgency of urination: Secondary | ICD-10-CM

## 2018-07-31 LAB — BLADDER SCAN AMB NON-IMAGING: Scan Result: 27

## 2018-07-31 NOTE — Progress Notes (Signed)
Catheter Removal  Patient is present today for a catheter removal.  30ml of water was drained from the balloon. A 14FR foley cath was removed from the bladder no complications were noted . Patient tolerated well.  Preformed by: Elberta Leatherwood, CMA  Follow up/ Additional notes: Return at 2:00PM for PVR  Bladder Scan Patient can void: residual 27 ml Performed By: Elberta Leatherwood, CMA

## 2019-06-25 ENCOUNTER — Emergency Department: Payer: Medicare Other

## 2019-06-25 ENCOUNTER — Other Ambulatory Visit: Payer: Self-pay

## 2019-06-25 ENCOUNTER — Encounter: Payer: Self-pay | Admitting: Intensive Care

## 2019-06-25 ENCOUNTER — Inpatient Hospital Stay
Admission: EM | Admit: 2019-06-25 | Discharge: 2019-06-28 | DRG: 193 | Disposition: A | Discharge status: Home / Self Care | Payer: Medicare Other | Attending: Internal Medicine | Admitting: Internal Medicine

## 2019-06-25 DIAGNOSIS — N183 Chronic kidney disease, stage 3 (moderate): Secondary | ICD-10-CM | POA: Diagnosis present

## 2019-06-25 DIAGNOSIS — Z79899 Other long term (current) drug therapy: Secondary | ICD-10-CM

## 2019-06-25 DIAGNOSIS — H9190 Unspecified hearing loss, unspecified ear: Secondary | ICD-10-CM | POA: Diagnosis present

## 2019-06-25 DIAGNOSIS — I129 Hypertensive chronic kidney disease with stage 1 through stage 4 chronic kidney disease, or unspecified chronic kidney disease: Secondary | ICD-10-CM | POA: Diagnosis present

## 2019-06-25 DIAGNOSIS — F17210 Nicotine dependence, cigarettes, uncomplicated: Secondary | ICD-10-CM | POA: Diagnosis present

## 2019-06-25 DIAGNOSIS — Z66 Do not resuscitate: Secondary | ICD-10-CM | POA: Diagnosis present

## 2019-06-25 DIAGNOSIS — J9601 Acute respiratory failure with hypoxia: Secondary | ICD-10-CM | POA: Diagnosis present

## 2019-06-25 DIAGNOSIS — J189 Pneumonia, unspecified organism: Principal | ICD-10-CM | POA: Diagnosis present

## 2019-06-25 DIAGNOSIS — E1122 Type 2 diabetes mellitus with diabetic chronic kidney disease: Secondary | ICD-10-CM | POA: Diagnosis present

## 2019-06-25 DIAGNOSIS — N179 Acute kidney failure, unspecified: Secondary | ICD-10-CM | POA: Diagnosis present

## 2019-06-25 DIAGNOSIS — Z7189 Other specified counseling: Secondary | ICD-10-CM | POA: Diagnosis not present

## 2019-06-25 DIAGNOSIS — Z515 Encounter for palliative care: Secondary | ICD-10-CM | POA: Diagnosis not present

## 2019-06-25 DIAGNOSIS — Z955 Presence of coronary angioplasty implant and graft: Secondary | ICD-10-CM | POA: Diagnosis not present

## 2019-06-25 DIAGNOSIS — R0602 Shortness of breath: Secondary | ICD-10-CM | POA: Diagnosis present

## 2019-06-25 DIAGNOSIS — Z20828 Contact with and (suspected) exposure to other viral communicable diseases: Secondary | ICD-10-CM | POA: Diagnosis present

## 2019-06-25 DIAGNOSIS — Z7982 Long term (current) use of aspirin: Secondary | ICD-10-CM | POA: Diagnosis not present

## 2019-06-25 DIAGNOSIS — E860 Dehydration: Secondary | ICD-10-CM | POA: Diagnosis present

## 2019-06-25 LAB — CBC WITH DIFFERENTIAL/PLATELET
Abs Immature Granulocytes: 0.07 10*3/uL (ref 0.00–0.07)
Basophils Absolute: 0 10*3/uL (ref 0.0–0.1)
Basophils Relative: 0 %
Eosinophils Absolute: 0 10*3/uL (ref 0.0–0.5)
Eosinophils Relative: 0 %
HCT: 39.9 % (ref 39.0–52.0)
Hemoglobin: 13 g/dL (ref 13.0–17.0)
Immature Granulocytes: 1 %
Lymphocytes Relative: 4 %
Lymphs Abs: 0.5 10*3/uL — ABNORMAL LOW (ref 0.7–4.0)
MCH: 30.4 pg (ref 26.0–34.0)
MCHC: 32.6 g/dL (ref 30.0–36.0)
MCV: 93.4 fL (ref 80.0–100.0)
Monocytes Absolute: 1.3 10*3/uL — ABNORMAL HIGH (ref 0.1–1.0)
Monocytes Relative: 9 %
Neutro Abs: 13.3 10*3/uL — ABNORMAL HIGH (ref 1.7–7.7)
Neutrophils Relative %: 86 %
Platelets: 319 10*3/uL (ref 150–400)
RBC: 4.27 MIL/uL (ref 4.22–5.81)
RDW: 13.2 % (ref 11.5–15.5)
WBC: 15.2 10*3/uL — ABNORMAL HIGH (ref 4.0–10.5)
nRBC: 0 % (ref 0.0–0.2)

## 2019-06-25 LAB — BLOOD GAS, VENOUS
Acid-base deficit: 3.9 mmol/L — ABNORMAL HIGH (ref 0.0–2.0)
Bicarbonate: 22.2 mmol/L (ref 20.0–28.0)
O2 Saturation: 71.3 %
Patient temperature: 37
pCO2, Ven: 43 mmHg — ABNORMAL LOW (ref 44.0–60.0)
pH, Ven: 7.32 (ref 7.250–7.430)
pO2, Ven: 41 mmHg (ref 32.0–45.0)

## 2019-06-25 LAB — BASIC METABOLIC PANEL
Anion gap: 13 (ref 5–15)
BUN: 50 mg/dL — ABNORMAL HIGH (ref 8–23)
CO2: 20 mmol/L — ABNORMAL LOW (ref 22–32)
Calcium: 8.4 mg/dL — ABNORMAL LOW (ref 8.9–10.3)
Chloride: 106 mmol/L (ref 98–111)
Creatinine, Ser: 2.01 mg/dL — ABNORMAL HIGH (ref 0.61–1.24)
GFR calc Af Amer: 34 mL/min — ABNORMAL LOW (ref >60)
GFR calc non Af Amer: 29 mL/min — ABNORMAL LOW (ref >60)
Glucose, Bld: 113 mg/dL — ABNORMAL HIGH (ref 70–99)
Potassium: 4.5 mmol/L (ref 3.5–5.1)
Sodium: 139 mmol/L (ref 135–145)

## 2019-06-25 LAB — CK: Total CK: 99 U/L (ref 49–397)

## 2019-06-25 LAB — SARS CORONAVIRUS 2 BY RT PCR (HOSPITAL ORDER, PERFORMED IN ~~LOC~~ HOSPITAL LAB): SARS Coronavirus 2: NEGATIVE

## 2019-06-25 LAB — BRAIN NATRIURETIC PEPTIDE: B Natriuretic Peptide: 114 pg/mL — ABNORMAL HIGH (ref 0.0–100.0)

## 2019-06-25 LAB — TROPONIN I (HIGH SENSITIVITY): Troponin I (High Sensitivity): 13 ng/L (ref <18)

## 2019-06-25 MED ORDER — SODIUM CHLORIDE 0.9 % IV SOLN
INTRAVENOUS | Status: DC
  Administered 2019-06-25 – 2019-06-28 (×6): via INTRAVENOUS

## 2019-06-25 MED ORDER — LEVOFLOXACIN IN D5W 750 MG/150ML IV SOLN
750.0000 mg | Freq: Once | INTRAVENOUS | Status: AC
  Administered 2019-06-25: 11:00:00 750 mg via INTRAVENOUS
  Filled 2019-06-25: qty 150

## 2019-06-25 MED ORDER — IPRATROPIUM-ALBUTEROL 0.5-2.5 (3) MG/3ML IN SOLN
3.0000 mL | Freq: Once | RESPIRATORY_TRACT | Status: AC
  Administered 2019-06-25: 10:00:00 3 mL via RESPIRATORY_TRACT
  Filled 2019-06-25: qty 3

## 2019-06-25 MED ORDER — DEXTROSE 5 % IV SOLN
250.0000 mg | INTRAVENOUS | Status: DC
  Administered 2019-06-26 – 2019-06-27 (×2): 250 mg via INTRAVENOUS
  Filled 2019-06-25 (×4): qty 250

## 2019-06-25 MED ORDER — SIMVASTATIN 20 MG PO TABS
10.0000 mg | ORAL_TABLET | Freq: Every day | ORAL | Status: DC
  Administered 2019-06-25 – 2019-06-27 (×3): 10 mg via ORAL
  Filled 2019-06-25 (×3): qty 1

## 2019-06-25 MED ORDER — METHYLPREDNISOLONE SODIUM SUCC 125 MG IJ SOLR
125.0000 mg | Freq: Once | INTRAMUSCULAR | Status: AC
  Administered 2019-06-25: 125 mg via INTRAVENOUS
  Filled 2019-06-25: qty 2

## 2019-06-25 MED ORDER — CYANOCOBALAMIN 1000 MCG/ML IJ SOLN: 1000.0000 ug | INTRAMUSCULAR | Status: DC

## 2019-06-25 MED ORDER — IPRATROPIUM-ALBUTEROL 0.5-2.5 (3) MG/3ML IN SOLN
3.0000 mL | Freq: Once | RESPIRATORY_TRACT | Status: AC
  Administered 2019-06-25: 3 mL via RESPIRATORY_TRACT
  Filled 2019-06-25: qty 3

## 2019-06-25 MED ORDER — SODIUM CHLORIDE 0.9 % IV SOLN
1.0000 g | INTRAVENOUS | Status: DC
  Administered 2019-06-26 – 2019-06-27 (×2): 1 g via INTRAVENOUS
  Filled 2019-06-25 (×2): qty 1
  Filled 2019-06-25: qty 10

## 2019-06-25 MED ORDER — HEPARIN SODIUM (PORCINE) 5000 UNIT/ML IJ SOLN
5000.0000 [IU] | Freq: Three times a day (TID) | INTRAMUSCULAR | Status: DC
  Administered 2019-06-25 – 2019-06-28 (×6): 5000 [IU] via SUBCUTANEOUS
  Filled 2019-06-25 (×6): qty 1

## 2019-06-25 MED ORDER — DOXAZOSIN MESYLATE 4 MG PO TABS
4.0000 mg | ORAL_TABLET | Freq: Every day | ORAL | Status: DC
  Administered 2019-06-25 – 2019-06-27 (×3): 4 mg via ORAL
  Filled 2019-06-25 (×4): qty 1

## 2019-06-25 MED ORDER — PANTOPRAZOLE SODIUM 40 MG PO TBEC
40.0000 mg | DELAYED_RELEASE_TABLET | Freq: Every day | ORAL | Status: DC
  Administered 2019-06-26 – 2019-06-28 (×3): 40 mg via ORAL
  Filled 2019-06-25 (×3): qty 1

## 2019-06-25 MED ORDER — ASPIRIN 81 MG PO CHEW
81.0000 mg | CHEWABLE_TABLET | Freq: Every day | ORAL | Status: DC
  Administered 2019-06-26 – 2019-06-28 (×3): 81 mg via ORAL
  Filled 2019-06-25 (×3): qty 1

## 2019-06-25 NOTE — H&P (Signed)
[place] [facility] [place] NAME: George [name]    MR#:  [id number]  DATE OF BIRTH:  Dec 27, 1931  DATE OF ADMISSION:  06/25/2019  PRIMARY CARE PHYSICIAN: Juluis Pitch, MD   REQUESTING/REFERRING PHYSICIAN: Jimmye Norman  CHIEF COMPLAINT:   Chief Complaint  Patient presents with  . Shortness of Breath    HISTORY OF PRESENT ILLNESS: George [name]  is a 85 y.o. male with a known history of diabetes, hypertension-lives alone at home and has significant hearing deficit.  Is very independent in day-to-day life he drives and take care of all his needs. Since last Tuesday he started having some cough and felt weak.  He had some on and off fever and chills.  He tried calling his friend for some help but realized his phone was not working.  To get some help he thought to go out of house on the street with a torch light to get attention of neighbors or other people.  But he was so weak that he could not get out of house.  He stayed in his house for last 5 days and did not had much food, so ate a few crackers. Finally he was able to get help and come to emergency room.  Noted to have pneumonia on chest x-ray.  His COVID test was negative. He was noted to have hypoxia, tachypnea, elevated white blood cell count and acute worsening in renal failure.  So given to hospitalist team for further management.  PAST MEDICAL HISTORY:   Past Medical History:  Diagnosis Date  . Cancer ([place])   . Diabetes mellitus without complication ([place])     PAST SURGICAL HISTORY:  Past Surgical History:  Procedure Laterality Date  . CARDIAC CATHETERIZATION     has a stent    SOCIAL HISTORY:  Social History   Tobacco Use  . Smoking status: Current Every Day Smoker    Packs/day: 1.00    Types: Cigarettes  . Smokeless tobacco: Never Used  Substance Use Topics  . Alcohol use: Yes    Comment: occ    FAMILY HISTORY:  Family History  Problem Relation Age of Onset  . Prostate  cancer Neg Hx   . Bladder Cancer Neg Hx   . Kidney cancer Neg Hx     DRUG ALLERGIES: No Known Allergies  REVIEW OF SYSTEMS:   CONSTITUTIONAL: He had fever, fatigue or weakness.  EYES: No blurred or double vision.  EARS, NOSE, AND THROAT: No tinnitus or ear pain.  RESPIRATORY: Have cough, shortness of breath, no wheezing or hemoptysis.  CARDIOVASCULAR: No chest pain, orthopnea, edema.  GASTROINTESTINAL: No nausea, vomiting, diarrhea or abdominal pain.  GENITOURINARY: No dysuria, hematuria.  ENDOCRINE: No polyuria, nocturia,  HEMATOLOGY: No anemia, easy bruising or bleeding SKIN: No rash or lesion. MUSCULOSKELETAL: No joint pain or arthritis.   NEUROLOGIC: No tingling, numbness, weakness.  PSYCHIATRY: No anxiety or depression.   MEDICATIONS AT HOME:  Prior to Admission medications   Medication Sig Start Date End Date Taking? Authorizing Provider  aspirin 81 MG chewable tablet Chew 81 mg by mouth daily.   Yes [provider]  cyanocobalamin (,VITAMIN B-12,) 1000 MCG/ML injection Inject 1,000 mcg into the muscle every 30 (thirty) days.   Yes [provider]  doxazosin (CARDURA) 4 MG tablet Take 4 mg by mouth at bedtime.   Yes [provider]  lisinopril (PRINIVIL,ZESTRIL) 20 MG tablet Take 20 mg by mouth 2 (two) times daily.   Yes  [provider]  omeprazole (PRILOSEC) 20 MG capsule Take 20 mg by mouth daily.   Yes [provider]  sildenafil (REVATIO) 20 MG tablet Take 40-100 mg by mouth as needed (for ED).    Yes [provider]  simvastatin (ZOCOR) 20 MG tablet Take 10 mg by mouth at bedtime.   Yes [provider]  testosterone cypionate (DEPOTESTOSTERONE CYPIONATE) 200 MG/ML injection Inject 200 mg into the muscle every 14 (fourteen) days.   Yes [provider]  verapamil (CALAN-SR) 240 MG CR tablet Take 240 mg by mouth 2 (two) times daily.   Yes [provider]      PHYSICAL EXAMINATION:   VITAL  SIGNS: Blood pressure 135/65, pulse 88, temperature 98.5 F (36.9 C), temperature source Oral, resp. rate 16, height 5\' 10"  (1.778 m), weight 74.8 kg, SpO2 95 %.  GENERAL:  85 y.o.-year-old patient lying in the bed with no acute distress.  EYES: Pupils equal, round, reactive to light and accommodation. No scleral icterus. Extraocular muscles intact.  HEENT: Head atraumatic, normocephalic. Oropharynx and nasopharynx clear.  NECK:  Supple, no jugular venous distention. No thyroid enlargement, no tenderness.  LUNGS: Normal breath sounds bilaterally, no wheezing, some crepitation. No use of accessory muscles of respiration.  CARDIOVASCULAR: S1, S2 normal. No murmurs, rubs, or gallops.  ABDOMEN: Soft, nontender, nondistended. Bowel sounds present. No organomegaly or mass.  EXTREMITIES: No pedal edema, cyanosis, or clubbing.  NEUROLOGIC: Cranial nerves II through XII are intact. Muscle strength 4/5 in all extremities. Sensation intact. Gait not checked.  PSYCHIATRIC: The patient is alert and oriented x 3.  SKIN: No obvious rash, lesion, or ulcer.   LABORATORY PANEL:   CBC Recent Labs  Lab 06/25/19 0941  WBC 15.2*  HGB 13.0  HCT 39.9  PLT 319  MCV 93.4  MCH 30.4  MCHC 32.6  RDW 13.2  LYMPHSABS 0.5*  MONOABS 1.3*  EOSABS 0.0  BASOSABS 0.0   ------------------------------------------------------------------------------------------------------------------  Chemistries  Recent Labs  Lab 06/25/19 0941  NA 139  K 4.5  CL 106  CO2 20*  GLUCOSE 113*  BUN 50*  CREATININE 2.01*  CALCIUM 8.4*   ------------------------------------------------------------------------------------------------------------------ estimated creatinine clearance is 27.7 mL/min (A) (by C-G formula based on SCr of 2.01 mg/dL (H)). ------------------------------------------------------------------------------------------------------------------ No results for input(s): TSH, T4TOTAL, T3FREE, THYROIDAB in the  last 72 hours.  Invalid input(s): FREET3   Coagulation profile No results for input(s): INR, PROTIME in the last 168 hours. ------------------------------------------------------------------------------------------------------------------- No results for input(s): DDIMER in the last 72 hours. -------------------------------------------------------------------------------------------------------------------  Cardiac Enzymes No results for input(s): CKMB, TROPONINI, MYOGLOBIN in the last 168 hours.  Invalid input(s): CK ------------------------------------------------------------------------------------------------------------------ Invalid input(s): POCBNP  ---------------------------------------------------------------------------------------------------------------  Urinalysis    Component Value Date/Time   COLORURINE YELLOW (A) 07/23/2018 0754   APPEARANCEUR CLEAR (A) 07/23/2018 0754   LABSPEC 1.012 07/23/2018 0754   PHURINE 5.0 07/23/2018 0754   GLUCOSEU NEGATIVE 07/23/2018 0754   HGBUR MODERATE (A) 07/23/2018 0754   BILIRUBINUR NEGATIVE 07/23/2018 0754   KETONESUR NEGATIVE 07/23/2018 0754   PROTEINUR NEGATIVE 07/23/2018 0754   NITRITE NEGATIVE 07/23/2018 0754   LEUKOCYTESUR NEGATIVE 07/23/2018 0754     RADIOLOGY: Dg Chest Port 1 View  Result Date: 06/25/2019 CLINICAL DATA:  Shortness of breath EXAM: PORTABLE CHEST 1 VIEW COMPARISON:  02/09/2016 FINDINGS: Cardiomediastinal silhouette is within normal limits. Calcific aortic knob. Patchy perihilar and bibasilar airspace opacities, left worse than right. Blunting of the bilateral costophrenic angles may reflect small bilateral pleural effusions versus scarring. No  pneumothorax. IMPRESSION: 1. Patchy bilateral airspace opacities, left greater than right, suspicious for atypical or viral pneumonia in the appropriate clinical setting. 2. Probable small bilateral pleural effusions. Electronically Signed   By: Davina Poke  M.D.   On: 06/25/2019 10:06    EKG: Orders placed or performed during the hospital encounter of 06/25/19  . ED EKG  . ED EKG  . EKG 12-Lead  . EKG 12-Lead    IMPRESSION AND PLAN:  *Sepsis with community-acquired pneumonia Acute respiratory failure due to above  IV fluids, cultures are sent. Continue supplemental oxygen. Ceftriaxone and azithromycin for now. Code test is negative.  *Acute renal failure on CKD stage III Due to dehydration Continue IV fluid and monitor renal function.  *History of hypertension Continue to monitor.  Lisinopril is not ordered here because of worsening renal function and sepsis.  *Generalized weakness Get CK level and physical therapy evaluation.  All the records are reviewed and case discussed with ED provider. Management plans discussed with the patient, family and they are in agreement.  CODE STATUS: DNR.    Code Status Orders  (From admission, onward)         Start     Ordered   06/25/19 1617  Do not attempt resuscitation (DNR)  Continuous    Question Answer Comment  In the event of cardiac or respiratory ARREST Do not call a "code blue"   In the event of cardiac or respiratory ARREST Do not perform Intubation, CPR, defibrillation or ACLS   In the event of cardiac or respiratory ARREST Use medication by any route, position, wound care, and other measures to relive pain and suffering. May use oxygen, suction and manual treatment of airway obstruction as needed for comfort.      06/25/19 1616        Code Status History    Date Active Date Inactive Code Status Order ID Comments User Context   07/11/2018 1345 07/12/2018 1911 Full Code [id number]  Gorden Harms, MD ED   02/09/2016 0036 02/09/2016 1914 Full Code [id number]  Norval Morton, MD ED   Advance Care Planning Activity     Patient lives alone and has some good few friends but does not have any family or living will in place.  I have called social worker consult to help  arranging for these.  TOTAL TIME TAKING CARE OF THIS PATIENT: 45 minutes.    Vaughan Basta M.D on 06/25/2019   Between 7am to 6pm - Pager - [phone]  After 6pm go to [email or web address] - password [email or web address] [facility]  Office  [phone]  CC: Primary care physician; Juluis Pitch, MD   Note: This dictation was prepared with Dragon dictation along with smaller phrase technology. Any transcriptional errors that result from this process are unintentional.

## 2019-06-25 NOTE — ED Notes (Signed)
Pt resting in bed speaking with this RN in NAD, O2 90% on 2L [place], increased to 3L at this time. NAD, A&Ox4

## 2019-06-25 NOTE — ED Provider Notes (Signed)
[facility] Emergency Department Provider Note       Time seen: ----------------------------------------- 9:41 AM on 06/25/2019 -----------------------------------------   I have reviewed the triage vital signs and the nursing notes.  HISTORY   Chief Complaint Shortness of Breath    HPI George [name] is a 85 y.o. male with a history of cancer, diabetes, GERD who presents to the ED for respiratory distress.  Patient reports trouble breathing over the last week.  Patient states he was trapped at home and his phone was not working.  He had no way of coming to the hospital.  He describes worsening shortness of breath, he was requiring nasal cannula oxygen in route by EMS.  Past Medical History:  Diagnosis Date  . Cancer ([place])   . Diabetes mellitus without complication [facility])     Patient Active Problem List   Diagnosis Date Noted  . Foot pain 07/11/2018  . Urinary urgency 06/27/2017  . Erectile dysfunction 06/27/2017  . Stage 3 acute kidney injury ([place]) 06/27/2017  . Hypogonadism in male 06/27/2017  . Vomiting 02/09/2016  . Slurred speech 02/09/2016  . Nausea and vomiting 02/09/2016  . Lower abdominal pain 02/09/2016  . Tobacco abuse 02/09/2016  . Essential hypertension 02/09/2016  . GERD (gastroesophageal reflux disease) 02/09/2016    Past Surgical History:  Procedure Laterality Date  . CARDIAC CATHETERIZATION     has a stent    Allergies Patient has no known allergies.  Social History Social History   Tobacco Use  . Smoking status: Current Every Day Smoker    Packs/day: 1.00    Types: Cigarettes  . Smokeless tobacco: Never Used  Substance Use Topics  . Alcohol use: Yes  . Drug use: No   Review of Systems Constitutional: Negative for fever. Cardiovascular: Negative for chest pain. Respiratory: Positive for shortness of breath Gastrointestinal: Negative for abdominal pain, vomiting and diarrhea. Musculoskeletal: Negative for  back pain. Skin: Negative for rash. Neurological: Positive for weakness  All systems negative/normal/unremarkable except as stated in the HPI  ____________________________________________   PHYSICAL EXAM:  VITAL SIGNS: ED Triage Vitals [06/25/19 0940]  Enc Vitals Group     BP (!) 112/55     Pulse Rate 85     Resp (!) 22     Temp (!) 97.4 F (36.3 C)     Temp Source Oral     SpO2 90 %     Weight      Height      Head Circumference      Peak Flow      Pain Score      Pain Loc      Pain Edu?      Excl. in [place]?     Constitutional: Alert and oriented.  Chronically ill-appearing, mild distress Eyes: Conjunctivae are normal. Normal extraocular movements. ENT      Head: Normocephalic and atraumatic.      Nose: No congestion/rhinnorhea.      Mouth/Throat: Mucous membranes are moist.      Neck: No stridor. Cardiovascular: Normal rate, regular rhythm. No murmurs, rubs, or gallops. Respiratory: Tachypnea with wheezing bilaterally Gastrointestinal: Soft and nontender. Normal bowel sounds Musculoskeletal: Nontender with normal range of motion in extremities. No lower extremity tenderness nor edema. Neurologic:  Normal speech and language. No gross focal neurologic deficits are appreciated.  Skin:  Skin is warm, dry and intact. No rash noted. Psychiatric: Mood and affect are normal. Speech and behavior are normal.  ____________________________________________  EKG:  Interpreted by me.  Sinus rhythm with a rate of 85 bpm, right bundle branch block, nonspecific ST segment changes are noted, left axis deviation, normal QT  ____________________________________________  ED COURSE:  As part of my medical decision making, I reviewed the following data within the [place] History obtained from family if available, nursing notes, old chart and ekg, as well as notes from prior ED visits. Patient presented for difficulty breathing, we will assess with labs and imaging as  indicated at this time.   Procedures  George [name] was evaluated in Emergency Department on 06/25/2019 for the symptoms described in the history of present illness. He was evaluated in the context of the global COVID-19 pandemic, which necessitated consideration that the patient might be at risk for infection with the SARS-CoV-2 virus that causes COVID-19. Institutional protocols and algorithms that pertain to the evaluation of patients at risk for COVID-19 are in a state of rapid change based on information released by regulatory bodies including the CDC and federal and state organizations. These policies and algorithms were followed during the patient's care in the ED.  ____________________________________________   LABS (pertinent positives/negatives)  Labs Reviewed  CBC WITH DIFFERENTIAL/PLATELET - Abnormal; Notable for the following components:      Result Value   WBC 15.2 (*)    Neutro Abs 13.3 (*)    Lymphs Abs 0.5 (*)    Monocytes Absolute 1.3 (*)    All other components within normal limits  BASIC METABOLIC PANEL - Abnormal; Notable for the following components:   CO2 20 (*)    Glucose, Bld 113 (*)    BUN 50 (*)    Creatinine, Ser 2.01 (*)    Calcium 8.4 (*)    GFR calc non Af Amer 29 (*)    GFR calc Af Amer 34 (*)    All other components within normal limits  BRAIN NATRIURETIC PEPTIDE - Abnormal; Notable for the following components:   B Natriuretic Peptide 114.0 (*)    All other components within normal limits  BLOOD GAS, VENOUS - Abnormal; Notable for the following components:   pCO2, Ven 43 (*)    Acid-base deficit 3.9 (*)    All other components within normal limits  SARS CORONAVIRUS 2 (HOSPITAL ORDER, [place])  TROPONIN I (HIGH SENSITIVITY)   CRITICAL CARE Performed by: Laurence Aly   Total critical care time: 30 minutes  Critical care time was exclusive of separately billable procedures and treating other  patients.  Critical care was necessary to treat or prevent imminent or life-threatening deterioration.  Critical care was time spent personally by me on the following activities: development of treatment plan with patient and/or surrogate as well as nursing, discussions with consultants, evaluation of patient's response to treatment, examination of patient, obtaining history from patient or surrogate, ordering and performing treatments and interventions, ordering and review of laboratory studies, ordering and review of radiographic studies, pulse oximetry and re-evaluation of patient's condition.  RADIOLOGY Images were viewed by me  Chest x-ray IMPRESSION:  1. Patchy bilateral airspace opacities, left greater than right,  suspicious for atypical or viral pneumonia in the appropriate  clinical setting.  2. Probable small bilateral pleural effusions.  ____________________________________________   DIFFERENTIAL DIAGNOSIS   CHF, COPD, pneumonia, COVID-19  FINAL ASSESSMENT AND PLAN  Dyspnea, community-acquired pneumonia, acute kidney injury   Plan: The patient had presented for shortness of breath.  Patient was given 3 duo nebs and  Solu-Medrol on arrival for what sounds like COPD.  Patient's labs did reveal acute kidney injury. Patient's imaging resembled bilateral airspace opacities concerning for pneumonia.  I did give him IV Levaquin.  He is currently feeling better after breathing treatments and steroids.  Troponin is still pending.  Nonspecific EKG changes are noted.  I will discuss with the hospitalist for admission.   Laurence Aly, George    Note: This note was generated in part or whole with voice recognition software. Voice recognition is usually quite accurate but there are transcription errors that can and very often do occur. I apologize for any typographical errors that were not detected and corrected.     Earleen Newport, George 06/25/19 1120

## 2019-06-25 NOTE — Progress Notes (Signed)
Patient was feeling cough and weakness for last 5 to 6 days and could not get out of house and his phone was broken.  Emergency room noted to have pneumonia and dehydration with acute renal failure. [place] test is negative.    Assessment and plan  Community-acquired pneumonia, acute renal failure  Antibiotics, IV fluids.  Cultures are sent. Physical therapy evaluation.

## 2019-06-25 NOTE — ED Triage Notes (Signed)
Pt from home via ems- lives alone, reports that he has been having sob/difficulty breathing since last Tuesday. Pt denies pain. Denies exposure to anyone sick. Afebrile. A/O x 4. Pt is HOH.

## 2019-06-25 NOTE — Progress Notes (Signed)
Family Meeting Note  Advance Directive: No  Today a meeting took place with the patient.  The following clinical team members were present during this meeting: MD  The following were discussed:Patient's diagnosis: Sepsis, pneumonia, generalized weakness, hypotension, Patient's progosis: Unable to determine.  And Goals for treatment: DNR.  Patient says he does not have advanced directive and he is estranged from his son.  He has a few good friends but he did not decide about his living will.  He made it clear in any adverse event do not try to resuscitate him but let him just go naturally.  Additional follow-up to be provided: Physical therapy  Time spent during discussion: 16 minutes  Vaughan Basta, MD

## 2019-06-26 LAB — BASIC METABOLIC PANEL
Anion gap: 9 (ref 5–15)
BUN: 50 mg/dL — ABNORMAL HIGH (ref 8–23)
CO2: 23 mmol/L (ref 22–32)
Calcium: 8.5 mg/dL — ABNORMAL LOW (ref 8.9–10.3)
Chloride: 108 mmol/L (ref 98–111)
Creatinine, Ser: 1.67 mg/dL — ABNORMAL HIGH (ref 0.61–1.24)
GFR calc Af Amer: 43 mL/min — ABNORMAL LOW (ref >60)
GFR calc non Af Amer: 37 mL/min — ABNORMAL LOW (ref >60)
Glucose, Bld: 171 mg/dL — ABNORMAL HIGH (ref 70–99)
Potassium: 4.9 mmol/L (ref 3.5–5.1)
Sodium: 140 mmol/L (ref 135–145)

## 2019-06-26 LAB — CBC
HCT: 38.5 % — ABNORMAL LOW (ref 39.0–52.0)
Hemoglobin: 13.1 g/dL (ref 13.0–17.0)
MCH: 31.2 pg (ref 26.0–34.0)
MCHC: 34 g/dL (ref 30.0–36.0)
MCV: 91.7 fL (ref 80.0–100.0)
Platelets: 302 10*3/uL (ref 150–400)
RBC: 4.2 MIL/uL — ABNORMAL LOW (ref 4.22–5.81)
RDW: 13.1 % (ref 11.5–15.5)
WBC: 23.8 10*3/uL — ABNORMAL HIGH (ref 4.0–10.5)
nRBC: 0 % (ref 0.0–0.2)

## 2019-06-26 NOTE — Evaluation (Signed)
Physical Therapy Evaluation Patient Details Name: George [name] MRN: [id number] DOB: May 26, 1932 Today's Date: 06/26/2019   History of Present Illness  Pt is a 85 y/o man admitted to the ER as a result of weakness related to pneumonia and difficulty breathing. PMH includes CA, HTN, and DM.    Clinical Impression  George [name] is a pleasant man who lives alone in a two story home (able to live on one floor). Upon entry, pt was asleep; it became evident upon waking that he was quite hard of hearing. He uses bilateral hearing aids; however his right is not currently functioning. LE strength assessment revealed 5/5 bilaterally. Pt was able to amb ~20 ft to reach the commode during session. He had previously not used an AD, however required a RW to move safely and effectively around room. His comfort increased with the walker, however he needed verbal cueing to reman close to the walker and take his time. Following ambulation, pt was experiencing increased work of breathing and heart rate , resulting in the decision to return him to his chair. Pt will benefit from physical therapy to address deficits in strength and activity intolerance as well as work with gait and stair training for safe navigation of his home and the community. Due to pt home environment and decreased caregiver support as well as decreased activity tolerance/functional mobility, current recommendation is STR.      Follow Up Recommendations SNF    Equipment Recommendations  Rolling walker with 5" wheels    Recommendations for Other Services OT consult     Precautions / Restrictions Precautions Precautions: Fall Restrictions Weight Bearing Restrictions: No      Mobility  Bed Mobility Overal bed mobility: Independent                Transfers Overall transfer level: Needs assistance Equipment used: Rolling walker (2 wheeled) Transfers: Sit to/from Stand Sit to Stand: Min guard             Ambulation/Gait Ambulation/Gait assistance: Min guard Gait Distance (Feet): 25 Feet Assistive device: Rolling walker (2 wheeled) Gait Pattern/deviations: Step-through pattern        Stairs            Wheelchair Mobility    Modified Rankin (Stroke Patients Only)       Balance Overall balance assessment: Needs assistance(RW required and CGA for standing) Sitting-balance support: Feet supported Sitting balance-Leahy Scale: Good     Standing balance support: Bilateral upper extremity supported(Pt able to stand w/o UE support; amb requires RW) Standing balance-Leahy Scale: Fair                               Pertinent Vitals/Pain Pain Assessment: No/denies pain    Home Living Family/patient expects to be discharged to:: Private residence Living Arrangements: Alone Available Help at Discharge: Friend(s);Neighbor Type of Home: House Home Access: Stairs to enter Entrance Stairs-Rails: Can reach both Entrance Stairs-Number of Steps: 4 Home Layout: Two level;Able to live on main level with bedroom/bathroom Home Equipment: Walker - standard Additional Comments: Pt lives alone in home; there is another house on his property with people that could help him    Prior Function Level of Independence: Independent               Hand Dominance        Extremity/Trunk Assessment        Lower Extremity Assessment Lower Extremity Assessment:  Overall WFL for tasks assessed(Bilat LE 5/5 MMT)    Cervical / Trunk Assessment Cervical / Trunk Assessment: Kyphotic  Communication   Communication: Other (comment)(Pt very hard of hearing; R hearing aid not functional)  Cognition Arousal/Alertness: Awake/alert Behavior During Therapy: WFL for tasks assessed/performed Overall Cognitive Status: Within Functional Limits for tasks assessed                                 General Comments: Pt is alert but when prompted could not immediately answer  what brought him to the hospital      General Comments      Exercises Other Exercises Other Exercises: Pt amb to comode with use of RW   Assessment/Plan    PT Assessment Patient needs continued PT services  PT Problem List Decreased strength;Decreased activity tolerance       PT Treatment Interventions Gait training;Stair training;Therapeutic exercise    PT Goals (Current goals can be found in the Care Plan section)  Acute Rehab PT Goals Patient Stated Goal: to return home PT Goal Formulation: With patient Time For Goal Achievement: 07/10/19 Potential to Achieve Goals: Fair    Frequency Min 2X/week   Barriers to discharge        Co-evaluation               AM-PAC PT "6 Clicks" Mobility  Outcome Measure Help needed turning from your back to your side while in a flat bed without using bedrails?: None Help needed moving from lying on your back to sitting on the side of a flat bed without using bedrails?: None Help needed moving to and from a bed to a chair (including a wheelchair)?: None Help needed standing up from a chair using your arms (e.g., wheelchair or bedside chair)?: A Little Help needed to walk in hospital room?: A Little Help needed climbing 3-5 steps with a railing? : A Little 6 Click Score: 21    End of Session Equipment Utilized During Treatment: Gait belt;Oxygen(3 L O2) Activity Tolerance: Patient limited by fatigue Patient left: in chair;with call bell/phone within reach;with chair alarm set Nurse Communication: Mobility status PT Visit Diagnosis: Unsteadiness on feet (R26.81);Difficulty in walking, not elsewhere classified (R26.2)    Time: [id number] PT Time Calculation (min) (ACUTE ONLY): 23 min   Charges:   PT Evaluation $PT Eval Low Complexity: 1 Low PT Treatments $Therapeutic Activity: 8-22 mins        Mareta Chesnut, SPT   Kiosha Buchan 06/26/2019, 1:39 PM

## 2019-06-26 NOTE — Progress Notes (Signed)
[place] [facility] [place] NAME: George [name]    MR#:  [id number]  DATE OF BIRTH:  [birth year]  SUBJECTIVE:  CHIEF COMPLAINT:   Chief Complaint  Patient presents with  . Shortness of Breath   Came with shortness of breath and pneumonia. Has some generalized weakness but feels slightly better today.  REVIEW OF SYSTEMS:  CONSTITUTIONAL: No fever, have fatigue or weakness.  EYES: No blurred or double vision.  EARS, NOSE, AND THROAT: No tinnitus or ear pain.  RESPIRATORY: have cough, shortness of breath, no wheezing or hemoptysis.  CARDIOVASCULAR: No chest pain, orthopnea, edema.  GASTROINTESTINAL: No nausea, vomiting, diarrhea or abdominal pain.  GENITOURINARY: No dysuria, hematuria.  ENDOCRINE: No polyuria, nocturia,  HEMATOLOGY: No anemia, easy bruising or bleeding SKIN: No rash or lesion. MUSCULOSKELETAL: No joint pain or arthritis.   NEUROLOGIC: No tingling, numbness, weakness.  PSYCHIATRY: No anxiety or depression.   ROS  DRUG ALLERGIES:  No Known Allergies  VITALS:  Blood pressure (!) 164/76, pulse 88, temperature (!) 97.4 F (36.3 C), temperature source Oral, resp. rate (!) 22, height 5\' 10"  (1.778 m), weight 74.8 kg, SpO2 96 %.  PHYSICAL EXAMINATION:  GENERAL:  85 y.o.-year-old patient lying in the bed with no acute distress.  EYES: Pupils equal, round, reactive to light and accommodation. No scleral icterus. Extraocular muscles intact.  HEENT: Head atraumatic, normocephalic. Oropharynx and nasopharynx clear.  NECK:  Supple, no jugular venous distention. No thyroid enlargement, no tenderness.  LUNGS: Normal breath sounds bilaterally, no wheezing, some crepitation. No use of accessory muscles of respiration. On Nasal canula. CARDIOVASCULAR: S1, S2 normal. No murmurs, rubs, or gallops.  ABDOMEN: Soft, nontender, nondistended. Bowel sounds present. No organomegaly or mass.  EXTREMITIES: No pedal edema, cyanosis, or clubbing.   NEUROLOGIC: Cranial nerves II through XII are intact. Muscle strength 3-4/5 in all extremities. Sensation intact. Gait not checked.  PSYCHIATRIC: The patient is alert and oriented x 3.  SKIN: No obvious rash, lesion, or ulcer.   Physical Exam LABORATORY PANEL:   CBC Recent Labs  Lab 06/26/19 0923  WBC 23.8*  HGB 13.1  HCT 38.5*  PLT 302   ------------------------------------------------------------------------------------------------------------------  Chemistries  Recent Labs  Lab 06/26/19 0923  NA 140  K 4.9  CL 108  CO2 23  GLUCOSE 171*  BUN 50*  CREATININE 1.67*  CALCIUM 8.5*   ------------------------------------------------------------------------------------------------------------------  Cardiac Enzymes No results for input(s): TROPONINI in the last 168 hours. ------------------------------------------------------------------------------------------------------------------  RADIOLOGY:  Dg Chest Port 1 View  Result Date: 06/25/2019 CLINICAL DATA:  Shortness of breath EXAM: PORTABLE CHEST 1 VIEW COMPARISON:  02/09/2016 FINDINGS: Cardiomediastinal silhouette is within normal limits. Calcific aortic knob. Patchy perihilar and bibasilar airspace opacities, left worse than right. Blunting of the bilateral costophrenic angles may reflect small bilateral pleural effusions versus scarring. No pneumothorax. IMPRESSION: 1. Patchy bilateral airspace opacities, left greater than right, suspicious for atypical or viral pneumonia in the appropriate clinical setting. 2. Probable small bilateral pleural effusions. Electronically Signed   By: Davina Poke M.D.   On: 06/25/2019 10:06    ASSESSMENT AND PLAN:   Active Problems:   Community acquired pneumonia   Pneumonia  *Sepsis with community-acquired pneumonia Acute respiratory failure due to above  IV fluids, cultures are sent. Continue supplemental oxygen. Ceftriaxone and azithromycin. Covid 19 test is  negative.  *Acute renal failure on CKD stage III Due to dehydration Continue IV fluid and monitor renal function.  improved.  *History of hypertension  Continue to monitor.  Lisinopril is not ordered here because of worsening renal function and sepsis.  *Generalized weakness CK level not high,  physical therapy evaluation.     All the records are reviewed and case discussed with Care Management/Social Workerr. Management plans discussed with the patient, family and they are in agreement.  CODE STATUS: DNR  TOTAL TIME TAKING CARE OF THIS PATIENT: 35 minutes.     POSSIBLE D/C IN 1-2 DAYS, DEPENDING ON CLINICAL CONDITION.   Vaughan Basta M.D on 06/26/2019   Between 7am to 6pm - Pager - [phone]  After 6pm go to [email or web address] - password [email or web address] [facility]  Office  [phone]  CC: Primary care physician; Juluis Pitch, MD  Note: This dictation was prepared with Dragon dictation along with smaller phrase technology. Any transcriptional errors that result from this process are unintentional.

## 2019-06-26 NOTE — Progress Notes (Signed)
Pt reports that his right groin has "burst" and he needs it checked out.  Pt appears to have an inguinal hernia.  Pt states it hurts when he strains to urinate or coughs. Dorna Bloom RN

## 2019-06-26 NOTE — TOC Initial Note (Signed)
Transition of Care [facility]) - Initial/Assessment Note    Patient Details  Name: George [name] MRN: [id number] Date of Birth: 04-Jun-1932  Transition of Care [facility]) CM/SW Contact:    Shelbie Hutching, RN Phone Number: 06/26/2019, 2:47 PM  Clinical Narrative:                 Patient admitted with pneumonia on acute O2 at 4L.  Patient is from home and lives alone.  Patient reports that he has someone that lives next door that can stay with him when he goes home.  Patient refuses SNF, states he is going home.  RNCM offered home health services and patient yells "[place]".  Patient goes into a rant about home health services coming out to his home in the past, saying they were not needed, then leaving and sending him a bill for $500.  Patient cannot tell RNCM the name of the agency used and reports talking to the hospital about the charges and having them work it out but then having the hospital call and threaten to ruin his credit if his bill is not paid.   RNCM informed patient that he does not have to go to SNF and he does not have to have home health services.   Patient reports that he does not need any DME- PT recommends RW- pt refuses saying he has a cane at home that he does not use.  RNCM signing off- re consult if additional needs arise.    Expected Discharge Plan: Home/Self Care Barriers to Discharge: Continued Medical Work up   Patient Goals and CMS Choice Patient states their goals for this hospitalization and ongoing recovery are:: Going home      Expected Discharge Plan and Services Expected Discharge Plan: Home/Self Care   Discharge Planning Services: CM Consult   Living arrangements for the past 2 months: Single Family Home                                      Prior Living Arrangements/Services Living arrangements for the past 2 months: Single Family Home Lives with:: Self Patient language and need for interpreter reviewed:: No Do you feel safe going back  to the place where you live?: Yes      Need for Family Participation in Patient Care: Yes (Comment)(pneumonia) Care giver support system in place?: Yes (comment)(pt reports care giver available)   Criminal Activity/Legal Involvement Pertinent to Current Situation/Hospitalization: No - Comment as needed  Activities of Daily Living Home Assistive Devices/Equipment: None ADL Screening (condition at time of admission) Patient's cognitive ability adequate to safely complete daily activities?: Yes Is the patient deaf or have difficulty hearing?: Yes Does the patient have difficulty seeing, even when wearing glasses/contacts?: No Does the patient have difficulty concentrating, remembering, or making decisions?: No Patient able to express need for assistance with ADLs?: Yes Does the patient have difficulty dressing or bathing?: No Independently performs ADLs?: Yes (appropriate for developmental age) Does the patient have difficulty walking or climbing stairs?: Yes Weakness of Legs: None Weakness of Arms/Hands: None  Permission Sought/Granted Permission sought to share information with : Case Manager Permission granted to share information with : Yes, Verbal Permission Granted              Emotional Assessment Appearance:: Appears stated age Attitude/Demeanor/Rapport: Engaged Affect (typically observed): Agitated, Angry, Explosive Orientation: : Oriented to Self, Oriented to  Place, Oriented to Situation, Oriented to  Time Alcohol / Substance Use: Not Applicable Psych Involvement: No (comment)  Admission diagnosis:  AKI (acute kidney injury) ([place]) [N17.9] Community acquired pneumonia, unspecified laterality [J18.9] Patient Active Problem List   Diagnosis Date Noted  . Community acquired pneumonia 06/25/2019  . Pneumonia 06/25/2019  . Foot pain 07/11/2018  . Urinary urgency 06/27/2017  . Erectile dysfunction 06/27/2017  . Stage 3 acute kidney injury ([place]) 06/27/2017  . Hypogonadism  in male 06/27/2017  . Vomiting 02/09/2016  . Slurred speech 02/09/2016  . Nausea and vomiting 02/09/2016  . Lower abdominal pain 02/09/2016  . Tobacco abuse 02/09/2016  . Essential hypertension 02/09/2016  . GERD (gastroesophageal reflux disease) 02/09/2016   PCP:  Juluis Pitch, MD Pharmacy:   [facility], [place] [facility] [address] [place] [address] Phone: [phone] Fax: [phone]     Social Determinants of Health (SDOH) Interventions    Readmission Risk Interventions No flowsheet data found.

## 2019-06-26 NOTE — Progress Notes (Signed)
OT Cancellation Note  Patient Details Name: CRUZITO VAUGHNS MRN: [id number] DOB: 1932/09/22   Cancelled Treatment:    Reason Eval/Treat Not Completed: Fatigue/lethargy limiting ability to participate. Consult received, chart reviewed. Pt sleeping soundly upon attempt. Will re-attempt next date as medically appropriate.   Jeni Salles, MPH, MS, OTR/L ascom [phone] 06/26/19, 4:11 PM

## 2019-06-27 ENCOUNTER — Inpatient Hospital Stay: Payer: Medicare Other

## 2019-06-27 DIAGNOSIS — Z515 Encounter for palliative care: Secondary | ICD-10-CM

## 2019-06-27 DIAGNOSIS — Z7189 Other specified counseling: Secondary | ICD-10-CM

## 2019-06-27 DIAGNOSIS — N179 Acute kidney failure, unspecified: Secondary | ICD-10-CM

## 2019-06-27 DIAGNOSIS — J189 Pneumonia, unspecified organism: Principal | ICD-10-CM

## 2019-06-27 LAB — HIV ANTIBODY (ROUTINE TESTING W REFLEX): HIV Screen 4th Generation wRfx: NONREACTIVE

## 2019-06-27 LAB — RESPIRATORY PANEL BY PCR

## 2019-06-27 MED ORDER — VERAPAMIL HCL ER 240 MG PO TBCR
240.0000 mg | EXTENDED_RELEASE_TABLET | Freq: Two times a day (BID) | ORAL | Status: DC
  Administered 2019-06-27 – 2019-06-28 (×3): 240 mg via ORAL
  Filled 2019-06-27 (×4): qty 1

## 2019-06-27 MED ORDER — LISINOPRIL 20 MG PO TABS
20.0000 mg | ORAL_TABLET | Freq: Two times a day (BID) | ORAL | Status: DC
  Administered 2019-06-27 – 2019-06-28 (×3): 20 mg via ORAL
  Filled 2019-06-27 (×3): qty 1

## 2019-06-27 NOTE — Progress Notes (Addendum)
[place] [facility] [place] NAME: George [name]    MR#:  [id number]  DATE OF BIRTH:  [birth year]  SUBJECTIVE:  CHIEF COMPLAINT:   Chief Complaint  Patient presents with  . Shortness of Breath   Came with shortness of breath and pneumonia. Has some generalized weakness -very hard of hearing, still short of breath, coughing  REVIEW OF SYSTEMS:  CONSTITUTIONAL: No fever, have fatigue or weakness.  EYES: No blurred or double vision.  EARS, NOSE, AND THROAT: No tinnitus or ear pain.  RESPIRATORY: have cough, shortness of breath, no wheezing or hemoptysis.  CARDIOVASCULAR: No chest pain, orthopnea, edema.  GASTROINTESTINAL: No nausea, vomiting, diarrhea or abdominal pain.  GENITOURINARY: No dysuria, hematuria.  ENDOCRINE: No polyuria, nocturia,  HEMATOLOGY: No anemia, easy bruising or bleeding SKIN: No rash or lesion. MUSCULOSKELETAL: No joint pain or arthritis.   NEUROLOGIC: No tingling, numbness, weakness.  PSYCHIATRY: No anxiety or depression.   ROS  DRUG ALLERGIES:  No Known Allergies  VITALS:  Blood pressure (!) 163/67, pulse 80, temperature 97.9 F (36.6 C), temperature source Oral, resp. rate 16, height 5\' 10"  (1.778 m), weight 74.8 kg, SpO2 95 %. PHYSICAL EXAMINATION:  GENERAL:  85 y.o.-year-old patient lying in the bed with no acute distress.  EYES: Pupils equal, round, reactive to light and accommodation. No scleral icterus. Extraocular muscles intact.  HEENT: Head atraumatic, normocephalic. Oropharynx and nasopharynx clear.  NECK:  Supple, no jugular venous distention. No thyroid enlargement, no tenderness.  LUNGS: Normal breath sounds bilaterally, no wheezing, some crepitation. No use of accessory muscles of respiration. On Nasal canula. CARDIOVASCULAR: S1, S2 normal. No murmurs, rubs, or gallops.  ABDOMEN: Soft, nontender, nondistended. Bowel sounds present. No organomegaly or mass.  EXTREMITIES: No pedal edema, cyanosis, or  clubbing.  NEUROLOGIC: Cranial nerves II through XII are intact. Muscle strength 3-4/5 in all extremities. Sensation intact. Gait not checked.  PSYCHIATRIC: The patient is alert and oriented x 3.  SKIN: No obvious rash, lesion, or ulcer.   Physical Exam LABORATORY PANEL:   CBC Recent Labs  Lab 06/26/19 0923  WBC 23.8*  HGB 13.1  HCT 38.5*  PLT 302   ------------------------------------------------------------------------------------------------------------------  Chemistries  Recent Labs  Lab 06/26/19 0923  NA 140  K 4.9  CL 108  CO2 23  GLUCOSE 171*  BUN 50*  CREATININE 1.67*  CALCIUM 8.5*   ------------------------------------------------------------------------------------------------------------------  Cardiac Enzymes No results for input(s): TROPONINI in the last 168 hours. ------------------------------------------------------------------------------------------------------------------  RADIOLOGY:  Dg Chest Port 1 View  Result Date: 06/25/2019 CLINICAL DATA:  Shortness of breath EXAM: PORTABLE CHEST 1 VIEW COMPARISON:  02/09/2016 FINDINGS: Cardiomediastinal silhouette is within normal limits. Calcific aortic knob. Patchy perihilar and bibasilar airspace opacities, left worse than right. Blunting of the bilateral costophrenic angles may reflect small bilateral pleural effusions versus scarring. No pneumothorax. IMPRESSION: 1. Patchy bilateral airspace opacities, left greater than right, suspicious for atypical or viral pneumonia in the appropriate clinical setting. 2. Probable small bilateral pleural effusions. Electronically Signed   By: Davina Poke M.D.   On: 06/25/2019 10:06    ASSESSMENT AND PLAN:   Active Problems:   Community acquired pneumonia   Pneumonia  *Sepsis - ruled out  * community-acquired pneumonia Acute respiratory failure due to above - Continue supplemental oxygen. -Continue ceftriaxone and azithromycin. Covid 19 test is  negative. -We will obtain CT chest, respiratory panel and influenza panel - he is still coughing bad and feels short of breath  *Acute  on CKD stage III -close to baseline Due to dehydration Continue IV fluid and monitor renal function.  improved.  *Uncontrolled hypertension -We will resume his home dose of verapamil and lisinopril for better blood pressure control  *Generalized weakness -Multifactorial -PT recommends rehab but patient is refusing both skilled nursing facility and home health services  He will benefit from palliative care evaluation and goals of care conversation.  He is at very high risk for readmission     All the records are reviewed and case discussed with Care Management/Social Workerr. Management plans discussed with the patient, nursing and they are in agreement.  CODE STATUS: DNR  TOTAL TIME TAKING CARE OF THIS PATIENT: 35 minutes.     POSSIBLE D/C IN 1-2 DAYS, DEPENDING ON CLINICAL CONDITION.   Max Sane M.D on 06/27/2019   Between 7am to 6pm - Pager - [phone]  After 6pm go to [email or web address] - password [email or web address] [facility]  Office  [phone]  CC: Primary care physician; Juluis Pitch, MD  Note: This dictation was prepared with Dragon dictation along with smaller phrase technology. Any transcriptional errors that result from this process are unintentional.

## 2019-06-27 NOTE — Consult Note (Signed)
Consultation Note Date: 06/27/2019   Patient Name: George [name]  DOB: 10-29-1931  MRN: [id number]  Age / Sex: 85 y.o., male  PCP: Juluis Pitch, MD Referring Physician: Max Sane, MD  Reason for Consultation: Establishing goals of care  HPI/Patient Profile: Came with shortness of breath and pneumonia.  Clinical Assessment and Goals of Care: Patient is resting in bed with [place] in place. 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 A friend of his lives in the other house. George [name] has been widowed since 2000. He states he has 1 child who is deceased and 1 whom he has not spoken to in 32 years.  He becomes tearful when discussing his wife. He states after she died he had several dreams/visions of her where she spoke with him, and began discussing the afterlife with him when he suddenly woke up. He discusses his frustration with religion and wrestling with his faith because he assisted with the finances of his church.      Functionally, he states he requires no assistance with any activities at baseline and his appetite is good.   We discussed his diagnosis, prognosis, GOC, EOL wishes disposition and options.  A detailed discussion was had today regarding advanced directives.  Concepts specific to code status, artifical feeding and hydration, and rehospitalization were discussed.  Values and goals of care important to patient was attempted to be elicited.  Discussed limitations of medical interventions to prolong quality of life in some situations and discussed the concept of human mortality.  He states he would not want to live in a vegetative state. He states he saw someone that after 3 years was "still living with tubes everywhere." He states he wants any care possible temporarily to see if the issue can be corrected, but if it cannot, he would want to be transitioned to  comfort. He does not give a time frame for this. He confirms DNR status.     SUMMARY OF RECOMMENDATIONS   Full scope/DNR.     Prognosis:   Unable to determine      Primary Diagnoses: Present on Admission: . Pneumonia   I have reviewed the medical record, interviewed the patient and family, and examined the patient. The following aspects are pertinent.  Past Medical History:  Diagnosis Date  . Cancer ([place])   . Diabetes mellitus without complication [facility])    Social History   Socioeconomic History  . Marital status: Widowed    Spouse name: Not on file  . Number of children: Not on file  . Years of education: Not on file  . Highest education level: Not on file  Occupational History  . Not on file  Social Needs  . Financial resource strain: Not on file  . Food insecurity    Worry: Not on file    Inability: Not on file  . Transportation needs    Medical: Not on file    Non-medical: Not on file  Tobacco Use  . Smoking  status: Current Every Day Smoker    Packs/day: 1.00    Types: Cigarettes  . Smokeless tobacco: Never Used  Substance and Sexual Activity  . Alcohol use: Yes    Comment: occ  . Drug use: No  . Sexual activity: Not on file  Lifestyle  . Physical activity    Days per week: Not on file    Minutes per session: Not on file  . Stress: Not on file  Relationships  . Social Herbalist on phone: Not on file    Gets together: Not on file    Attends religious service: Not on file    Active member of club or organization: Not on file    Attends meetings of clubs or organizations: Not on file    Relationship status: Not on file  Other Topics Concern  . Not on file  Social History Narrative  . Not on file   Family History  Problem Relation Age of Onset  . Prostate cancer Neg Hx   . Bladder Cancer Neg Hx   . Kidney cancer Neg Hx    Scheduled Meds: . aspirin  81 mg Oral Daily  . [START ON 07/01/2019] cyanocobalamin  1,000 mcg  Intramuscular Q30 days  . doxazosin  4 mg Oral QHS  . heparin  5,000 Units Subcutaneous Q8H  . lisinopril  20 mg Oral BID  . pantoprazole  40 mg Oral Daily  . simvastatin  10 mg Oral QHS  . verapamil  240 mg Oral BID   Continuous Infusions: . sodium chloride 75 mL/hr at 06/27/19 1130  . azithromycin Stopped (06/26/19 1528)  . cefTRIAXone (ROCEPHIN)  IV Stopped (06/26/19 1232)   PRN Meds:. Medications Prior to Admission:  Prior to Admission medications   Medication Sig Start Date End Date Taking? Authorizing Provider  aspirin 81 MG chewable tablet Chew 81 mg by mouth daily.   Yes [provider]  cyanocobalamin (,VITAMIN B-12,) 1000 MCG/ML injection Inject 1,000 mcg into the muscle every 30 (thirty) days.   Yes [provider]  doxazosin (CARDURA) 4 MG tablet Take 4 mg by mouth at bedtime.   Yes [provider]  lisinopril (PRINIVIL,ZESTRIL) 20 MG tablet Take 20 mg by mouth 2 (two) times daily.   Yes [provider]  omeprazole (PRILOSEC) 20 MG capsule Take 20 mg by mouth daily.   Yes [provider]  sildenafil (REVATIO) 20 MG tablet Take 40-100 mg by mouth as needed (for ED).    Yes [provider]  simvastatin (ZOCOR) 20 MG tablet Take 10 mg by mouth at bedtime.   Yes [provider]  testosterone cypionate (DEPOTESTOSTERONE CYPIONATE) 200 MG/ML injection Inject 200 mg into the muscle every 14 (fourteen) days.   Yes [provider]  verapamil (CALAN-SR) 240 MG CR tablet Take 240 mg by mouth 2 (two) times daily.   Yes [provider]   No Known Allergies Review of Systems  Constitutional: Positive for activity change.  Respiratory: Positive for shortness of breath.     Physical Exam Pulmonary:     Effort: Pulmonary effort is normal.     Comments: Coarse cough.  Skin:    General: Skin is warm and dry.  Neurological:     Mental Status: He is alert.     Vital Signs: BP (!) 163/67 (BP Location:  Right Arm)   Pulse 80   Temp 97.9 F (36.6 C) (Oral)   Resp 16   Ht 5'  10" (1.778 m)   Wt 74.8 kg   SpO2 95%   BMI 23.68 kg/m  Pain Scale: 0-10 POSS *See Group Information*: 1-Acceptable,Awake and alert Pain Score: 0-No pain   SpO2: SpO2: 95 % O2 Device:SpO2: 95 % O2 Flow Rate: .O2 Flow Rate (L/min): 3 L/min  IO: Intake/output summary:   Intake/Output Summary (Last 24 hours) at 06/27/2019 1320 Last data filed at 06/27/2019 1130 Gross per 24 hour  Intake 2702.86 ml  Output 1000 ml  Net 1702.86 ml    LBM: Last BM Date: (unknown) Baseline Weight: Weight: 74.8 kg Most recent weight: Weight: 74.8 kg     Palliative Assessment/Data:     Time In: 12:00 Time Out: 12:50 Time Total: 50 min Greater than 50%  of this time was spent counseling and coordinating care related to the above assessment and plan.  Signed by: Asencion Gowda, NP   Please contact Palliative Medicine Team phone at [phone] for questions and concerns.  For individual provider: See Shea Evans

## 2019-06-27 NOTE — Progress Notes (Signed)
PT Cancellation Note  Patient Details Name: George [name] MRN: [id number] DOB: Mar 15, 1932   Cancelled Treatment:    Reason Eval/Treat Not Completed: Fatigue/lethargy limiting ability to participate. Treatment attempted; pt soundly sleeping and unable to awaken. Re attempt at a later date. New orders were placed for PT to start 06/28/2019, but do not see need for new PT order this date. Pt is refusing skilled nursing facility care, so believe this may be related to assessing ability to return home.   Marland Kitchen   Larae Grooms, PTA 06/27/2019, 5:11 PM

## 2019-06-27 NOTE — Evaluation (Signed)
Occupational Therapy Evaluation Patient Details Name: George [name] MRN: [id number] DOB: 06-14-1932 Today's Date: 06/27/2019    History of Present Illness Pt is a 85 y/o man admitted to the ER as a result of weakness related to pneumonia and difficulty breathing. PMH includes CA, HTN, and DM.   Clinical Impression   Pt seen for OT evaluation this date. Pt reports he was independent in all ADLs and mobility, living in a 2 story home but able to live on main floor. Pt denies ever needing O2 at home. Currently on 3L O2 at start of session. Pt denies SOB/fatigue throughout session. Pt supervision to CGA for functional mobility without AD (pt refused RW). No overt LOB but pt mildly unsteady. Pt stood at the sink to brush his teeth and wash face without supplemental O2. Once back EOB, O2 sats 88-89% increasing quickly to >93% on RA with use of learned pursed lip breathing. Pt instructed in ECS with rest breaks/activity pacing and pursed lip breathing to support breath recovery. Pt verbalized understanding and would benefit from additional skilled OT services to maximize recall and carryover of learned techniques and facilitate implementation of learned techniques into daily routines. Upon discharge, recommend [place] services. RNCM notified.     Follow Up Recommendations  Home health OT;Supervision - Intermittent    Equipment Recommendations  None recommended by OT    Recommendations for Other Services       Precautions / Restrictions Precautions Precautions: Fall Precaution Comments: watch O2 sats with exertion; droplet Restrictions Weight Bearing Restrictions: No      Mobility Bed Mobility Overal bed mobility: Independent                Transfers Overall transfer level: Needs assistance Equipment used: None Transfers: Sit to/from Stand Sit to Stand: Min guard         General transfer comment: mild unsteadiness, pt refused RW    Balance Overall balance assessment: Needs  assistance   Sitting balance-Leahy Scale: Good     Standing balance support: No upper extremity supported Standing balance-Leahy Scale: Fair                             ADL either performed or assessed with clinical judgement   ADL                                         General ADL Comments: Near baseline, some mild SOB noted     Vision Patient Visual Report: No change from baseline       Perception     Praxis      Pertinent Vitals/Pain Pain Assessment: No/denies pain     Hand Dominance     Extremity/Trunk Assessment Upper Extremity Assessment Upper Extremity Assessment: Overall WFL for tasks assessed   Lower Extremity Assessment Lower Extremity Assessment: Overall WFL for tasks assessed   Cervical / Trunk Assessment Cervical / Trunk Assessment: Kyphotic   Communication Communication Communication: Other (comment)(Pt very hard of hearing; R hearing aid not functional)   Cognition Arousal/Alertness: Awake/alert Behavior During Therapy: WFL for tasks assessed/performed Overall Cognitive Status: Within Functional Limits for tasks assessed  General Comments       Exercises Other Exercises Other Exercises: Pt instructed in ECS with rest breaks/activity pacing and pursed lip breathing to support breath recovery   Shoulder Instructions      Home Living Family/patient expects to be discharged to:: Private residence Living Arrangements: Alone Available Help at Discharge: Friend(s);Neighbor Type of Home: House Home Access: Stairs to enter [employer] of Steps: 4 Entrance Stairs-Rails: Can reach both Home Layout: Two level;Able to live on main level with bedroom/bathroom               Home Equipment: Walker - standard   Additional Comments: Pt lives alone in home; there is another house on his property with people that could help him      Prior  Functioning/Environment Level of Independence: Independent                 OT Problem List: Decreased activity tolerance;Cardiopulmonary status limiting activity      OT Treatment/Interventions: Self-care/ADL training;Therapeutic activities;Energy conservation;DME and/or AE instruction;Patient/family education    OT Goals(Current goals can be found in the care plan section) Acute Rehab OT Goals Patient Stated Goal: to return home OT Goal Formulation: With patient Time For Goal Achievement: 07/11/19 Potential to Achieve Goals: Good ADL Goals Pt Will Transfer to Toilet: with supervision;ambulating(BSC over toilet) Additional ADL Goal #1: Pt will verbalize plan to implement at least 1 learned ECS in the home to minimize SOB/over exertion.  OT Frequency: Min 1X/week   Barriers to D/C: Decreased caregiver support          Co-evaluation              AM-PAC OT "6 Clicks" Daily Activity     Outcome Measure Help from another person eating meals?: None Help from another person taking care of personal grooming?: None Help from another person toileting, which includes using toliet, bedpan, or urinal?: A Little Help from another person bathing (including washing, rinsing, drying)?: A Little Help from another person to put on and taking off regular upper body clothing?: None Help from another person to put on and taking off regular lower body clothing?: A Little 6 Click Score: 21   End of Session Equipment Utilized During Treatment: Gait belt Nurse Communication: Other (comment)(IV done)  Activity Tolerance: Patient tolerated treatment well Patient left: in bed;with call bell/phone within reach;with bed alarm set;Other (comment)(nasal canula reapplied)  OT Visit Diagnosis: Other abnormalities of gait and mobility (R26.89)                Time: [id number] OT Time Calculation (min): 21 min Charges:  OT General Charges $OT Visit: 1 Visit OT Evaluation $OT Eval Low Complexity:  1 Low OT Treatments $Self Care/Home Management : 8-22 mins  Jeni Salles, MPH, MS, OTR/L ascom [phone] 06/27/19, 3:51 PM

## 2019-06-28 LAB — BASIC METABOLIC PANEL
Anion gap: 7 (ref 5–15)
BUN: 34 mg/dL — ABNORMAL HIGH (ref 8–23)
CO2: 23 mmol/L (ref 22–32)
Calcium: 8.2 mg/dL — ABNORMAL LOW (ref 8.9–10.3)
Chloride: 111 mmol/L (ref 98–111)
Creatinine, Ser: 1.37 mg/dL — ABNORMAL HIGH (ref 0.61–1.24)
GFR calc Af Amer: 54 mL/min — ABNORMAL LOW (ref >60)
GFR calc non Af Amer: 47 mL/min — ABNORMAL LOW (ref >60)
Glucose, Bld: 76 mg/dL (ref 70–99)
Potassium: 4.5 mmol/L (ref 3.5–5.1)
Sodium: 141 mmol/L (ref 135–145)

## 2019-06-28 LAB — CBC
HCT: 35.4 % — ABNORMAL LOW (ref 39.0–52.0)
Hemoglobin: 11.9 g/dL — ABNORMAL LOW (ref 13.0–17.0)
MCH: 31.1 pg (ref 26.0–34.0)
MCHC: 33.6 g/dL (ref 30.0–36.0)
MCV: 92.4 fL (ref 80.0–100.0)
Platelets: 287 10*3/uL (ref 150–400)
RBC: 3.83 MIL/uL — ABNORMAL LOW (ref 4.22–5.81)
RDW: 13.2 % (ref 11.5–15.5)
WBC: 8.7 10*3/uL (ref 4.0–10.5)
nRBC: 0 % (ref 0.0–0.2)

## 2019-06-28 MED ORDER — BUTALBITAL-APAP-CAFFEINE 50-325-40 MG PO TABS
1.0000 | ORAL_TABLET | Freq: Four times a day (QID) | ORAL | Status: DC | PRN
  Administered 2019-06-28: 07:00:00 1 via ORAL
  Filled 2019-06-28: qty 1

## 2019-06-28 NOTE — Care Management Important Message (Signed)
Important Message  Patient Details  Name: George [name] MRN: [id number] Date of Birth: Jul 10, 1932   Medicare Important Message Given:  Yes     Juliann Pulse A Lashawndra Lampkins 06/28/2019, 12:16 PM

## 2019-06-28 NOTE — Progress Notes (Signed)
Daily Progress Note   Patient Name: George [name]       Date: 06/28/2019 DOB: Nov 28, 1931  Age: 85 y.o. MRN#: [id number] Attending Physician: Max Sane, MD Primary Care Physician: Juluis Pitch, MD Admit Date: 06/25/2019  Reason for Consultation/Follow-up: Establishing goals of care  Subjective: Patient is sitting in bedside chair. He discusses in great detail issues with billing from home health and the hospital both. He states he does not want home health at D/C. He is amenable to palliative. He states he would like to speak with someone at the hospital about his billing.   Length of Stay: 3  Current Medications: Scheduled Meds:  . aspirin  81 mg Oral Daily  . [START ON 07/01/2019] cyanocobalamin  1,000 mcg Intramuscular Q30 days  . doxazosin  4 mg Oral QHS  . heparin  5,000 Units Subcutaneous Q8H  . lisinopril  20 mg Oral BID  . pantoprazole  40 mg Oral Daily  . simvastatin  10 mg Oral QHS  . verapamil  240 mg Oral BID    Continuous Infusions: . sodium chloride Stopped (06/28/19 0850)  . azithromycin Stopped (06/27/19 1610)  . cefTRIAXone (ROCEPHIN)  IV Stopped (06/27/19 1358)    PRN Meds: butalbital-acetaminophen-caffeine  Physical Exam Pulmonary:     Effort: Pulmonary effort is normal.  Skin:    General: Skin is warm and dry.  Neurological:     Mental Status: He is alert.             Vital Signs: BP (!) 150/64 (BP Location: Right Arm)   Pulse 70   Temp 98.3 F (36.8 C) (Oral)   Resp 18   Ht 5\' 10"  (1.778 m)   Wt 74.8 kg   SpO2 93%   BMI 23.68 kg/m  SpO2: SpO2: 93 % O2 Device: O2 Device: Room Air O2 Flow Rate: O2 Flow Rate (L/min): 3 L/min  Intake/output summary:   Intake/Output Summary (Last 24 hours) at 06/28/2019 1205 Last data filed at  06/28/2019 0857 Gross per 24 hour  Intake 1653.18 ml  Output 1200 ml  Net 453.18 ml   LBM: Last BM Date: (unknown by pt) Baseline Weight: Weight: 74.8 kg Most recent weight: Weight: 74.8 kg       Palliative Assessment/Data: 60%      Patient Active Problem List  Diagnosis Date Noted  . Community acquired pneumonia 06/25/2019  . Pneumonia 06/25/2019  . Foot pain 07/11/2018  . Urinary urgency 06/27/2017  . Erectile dysfunction 06/27/2017  . Stage 3 acute kidney injury ([place]) 06/27/2017  . Hypogonadism in male 06/27/2017  . Vomiting 02/09/2016  . Slurred speech 02/09/2016  . Nausea and vomiting 02/09/2016  . Lower abdominal pain 02/09/2016  . Tobacco abuse 02/09/2016  . Essential hypertension 02/09/2016  . GERD (gastroesophageal reflux disease) 02/09/2016    Palliative Care Assessment & Plan   Recommendations/Plan:  Recommend palliative at D/C.   Code Status:    Code Status Orders  (From admission, onward)         Start     Ordered   06/25/19 1617  Do not attempt resuscitation (DNR)  Continuous    Question Answer Comment  In the event of cardiac or respiratory ARREST Do not call a "code blue"   In the event of cardiac or respiratory ARREST Do not perform Intubation, CPR, defibrillation or ACLS   In the event of cardiac or respiratory ARREST Use medication by any route, position, wound care, and other measures to relive pain and suffering. May use oxygen, suction and manual treatment of airway obstruction as needed for comfort.      06/25/19 1616        Code Status History    Date Active Date Inactive Code Status Order ID Comments User Context   07/11/2018 1345 07/12/2018 1911 Full Code [id number]  Gorden Harms, MD ED   02/09/2016 0036 02/09/2016 1914 Full Code [id number]  Norval Morton, MD ED   Advance Care Planning Activity       Prognosis:   Unable to determine  Discharge Planning:  Home with Palliative Services  Care plan was discussed with  CM  Thank you for allowing the Palliative Medicine Team to assist in the care of this patient.   Total Time 35 min Prolonged Time Billed  no       Greater than 50%  of this time was spent counseling and coordinating care related to the above assessment and plan.  Asencion Gowda, NP  Please contact Palliative Medicine Team phone at [phone] for questions and concerns.

## 2019-06-28 NOTE — Progress Notes (Signed)
Transportation to be available at 1330. Madlyn Frankel, RN

## 2019-06-28 NOTE — Progress Notes (Signed)
Physical Therapy Treatment Patient Details Name: George [name] MRN: [id number] DOB: 03/29/1932 Today's Date: 06/28/2019    History of Present Illness Pt is a 85 y/o man admitted to the ER as a result of weakness related to pneumonia and difficulty breathing. PMH includes CA, HTN, and DM.    PT Comments    Pt in bed, ready for session.  94% on 2 lpm at rest.  O2 removed for qualifying O2 sats.  To edge of bed without assist.  Stood and was able to ambulate around unit x 1 with walker and min guard/assist at times.  Sats 87/88% upon return to room.  O2 reapplied and sats increased back to baseline.  O2 removed again and a shorter walk was taken 100' and again sats decreased to 87/88%.  O2 reapplied at 2 lpm and discussed with RN and Care Manager Tyrone.   Follow Up Recommendations  SNF     Equipment Recommendations  Rolling walker with 5" wheels    Recommendations for Other Services       Precautions / Restrictions Precautions Precautions: Fall Precaution Comments: watch O2 sats with exertion    Mobility  Bed Mobility Overal bed mobility: Independent                Transfers Overall transfer level: Needs assistance Equipment used: Rolling walker (2 wheeled) Transfers: Sit to/from Stand Sit to Stand: Min guard            Ambulation/Gait Ambulation/Gait assistance: Min guard Gait Distance (Feet): 200 Feet Assistive device: Rolling walker (2 wheeled) Gait Pattern/deviations: Step-through pattern;Narrow base of support;Trunk flexed Gait velocity: decreased   General Gait Details: 200, 100' after seated rest   Stairs             Wheelchair Mobility    Modified Rankin (Stroke Patients Only)       Balance Overall balance assessment: Needs assistance Sitting-balance support: Feet supported Sitting balance-Leahy Scale: Normal     Standing balance support: Bilateral upper extremity supported Standing balance-Leahy Scale: Good                              Cognition Arousal/Alertness: Awake/alert Behavior During Therapy: WFL for tasks assessed/performed Overall Cognitive Status: Within Functional Limits for tasks assessed                                        Exercises      General Comments        Pertinent Vitals/Pain Pain Assessment: No/denies pain    Home Living                      Prior Function            PT Goals (current goals can now be found in the care plan section)      Frequency    Min 2X/week      PT Plan Current plan remains appropriate    Co-evaluation              AM-PAC PT "6 Clicks" Mobility   Outcome Measure  Help needed turning from your back to your side while in a flat bed without using bedrails?: None Help needed moving from lying on your back to sitting on the side of a flat bed without using bedrails?: None Help needed  moving to and from a bed to a chair (including a wheelchair)?: None Help needed standing up from a chair using your arms (e.g., wheelchair or bedside chair)?: A Little Help needed to walk in hospital room?: A Little Help needed climbing 3-5 steps with a railing? : A Little 6 Click Score: 21    End of Session Equipment Utilized During Treatment: Gait belt;Oxygen Activity Tolerance: Patient tolerated treatment well Patient left: in chair;with call bell/phone within reach;with chair alarm set Nurse Communication: Other (comment)       [id number] PT Time Calculation (min) (ACUTE ONLY): 24 min  Charges:  $Gait Training: 23-37 mins                    Chesley Noon, PTA 06/28/19, 9:31 AM

## 2019-06-28 NOTE — Progress Notes (Signed)
New referral for outpatient Palliative to follow at home received from [facility]. Patient to discharge home today. Patient information given to palliative referral. Flo Shanks BSN, RN, [place] [employer] [phone]

## 2019-06-28 NOTE — TOC Transition Note (Addendum)
Transition of Care [facility]) - CM/SW Discharge Note   Patient Details  Name: George [name] MRN: [id number] Date of Birth: 1931-10-14  [facility] CM/SW Contact:  Shelbie Hutching, RN Phone Number: 06/28/2019, 12:14 PM   Clinical Narrative:     Patient will discharge home today.  Patient refused home health services but was okay with outpatient palliative services, referral given to Flo Shanks with [facility].  Patient has a friend that will be coming to pick him up this afternoon.  Patient does not qualify for home oxygen, weaned to room air by RN.   Final next level of care: Home/Self Care Barriers to Discharge: Barriers Resolved   Patient Goals and CMS Choice Patient states their goals for this hospitalization and ongoing recovery are:: Going home      Discharge Placement                       Discharge Plan and Services   Discharge Planning Services: CM Consult                      HH Arranged: Refused SNF, Refused HH          Social Determinants of Health (SDOH) Interventions     Readmission Risk Interventions No flowsheet data found.

## 2019-06-28 NOTE — Progress Notes (Signed)
Discharge instructions given and went over with patient at bedside. All questions answered. Patient to discharge home when transportation available. Madlyn Frankel, RN

## 2019-06-30 NOTE — Discharge Summary (Signed)
[facility] [place] NAME: George [name]    MR#:  [id number]  DATE OF BIRTH:  April 08, 1932  DATE OF ADMISSION:  06/25/2019   ADMITTING PHYSICIAN: Vaughan Basta, MD  DATE OF DISCHARGE: 06/28/2019  1:26 PM  PRIMARY CARE PHYSICIAN: Juluis Pitch, MD   ADMISSION DIAGNOSIS:  AKI (acute kidney injury) ([place]) [N17.9] Community acquired pneumonia, unspecified laterality [J18.9] DISCHARGE DIAGNOSIS:  Active Problems:   Community acquired pneumonia   Pneumonia  SECONDARY DIAGNOSIS:   Past Medical History:  Diagnosis Date  . Cancer ([place])   . Diabetes mellitus without complication [facility])    HOSPITAL COURSE:  85 y.o. male with a known history of diabetes, hypertension-lives alone at home and has significant hearing deficit.  Is very independent in day-to-day life he drives and take care of all his needs. Admitted for pneumonia  * community-acquired pneumonia Acute respiratory failure due to above - improved with Abx Covid 19 test is negative.  *Acute on CKD stage III -close to baseline  *Uncontrolled hypertension -Resume his home BP meds  *Generalized weakness -Multifactorial -PT recommends rehab but patient is refusing both skilled nursing facility and home health services DISCHARGE CONDITIONS:  stable CONSULTS OBTAINED:   DRUG ALLERGIES:  No Known Allergies DISCHARGE MEDICATIONS:   Allergies as of 06/28/2019   No Known Allergies     Medication List    TAKE these medications   aspirin 81 MG chewable tablet Chew 81 mg by mouth daily.   cyanocobalamin 1000 MCG/ML injection Commonly known as: (VITAMIN B-12) Inject 1,000 mcg into the muscle every 30 (thirty) days.   doxazosin 4 MG tablet Commonly known as: CARDURA Take 4 mg by mouth at bedtime.   lisinopril 20 MG tablet Commonly known as: ZESTRIL Take 20 mg by mouth 2 (two) times daily.   omeprazole 20 MG capsule Commonly known as: PRILOSEC Take 20 mg by  mouth daily.   sildenafil 20 MG tablet Commonly known as: REVATIO Take 40-100 mg by mouth as needed (for ED).   simvastatin 20 MG tablet Commonly known as: ZOCOR Take 10 mg by mouth at bedtime.   testosterone cypionate 200 MG/ML injection Commonly known as: DEPOTESTOSTERONE CYPIONATE Inject 200 mg into the muscle every 14 (fourteen) days.   verapamil 240 MG CR tablet Commonly known as: CALAN-SR Take 240 mg by mouth 2 (two) times daily.      DISCHARGE INSTRUCTIONS:   DIET:  Regular diet DISCHARGE CONDITION:  Good ACTIVITY:  Activity as tolerated OXYGEN:  Home Oxygen: No.  Oxygen Delivery: room air DISCHARGE LOCATION:  home with palliative care to follow  If you experience worsening of your admission symptoms, develop shortness of breath, life threatening emergency, suicidal or homicidal thoughts you must seek medical attention immediately by calling 911 or calling your MD immediately  if symptoms less severe.  You Must read complete instructions/literature along with all the possible adverse reactions/side effects for all the Medicines you take and that have been prescribed to you. Take any new Medicines after you have completely understood and accpet all the possible adverse reactions/side effects.   Please note  You were cared for by a hospitalist during your hospital stay. If you have any questions about your discharge medications or the care you received while you were in the hospital after you are discharged, you can call the unit and asked to speak with the hospitalist on call if the hospitalist that took care of you is not available. Once  you are discharged, your primary care physician will handle any further medical issues. Please note that NO REFILLS for any discharge medications will be authorized once you are discharged, as it is imperative that you return to your primary care physician (or establish a relationship with a primary care physician if you do not have  one) for your aftercare needs so that they can reassess your need for medications and monitor your lab values.    On the day of Discharge:  VITAL SIGNS:  Blood pressure (!) 150/64, pulse 70, temperature 98.3 F (36.8 C), temperature source Oral, resp. rate 18, height 5\' 10"  (1.778 m), weight 74.8 kg, SpO2 93 %. PHYSICAL EXAMINATION:  GENERAL:  85 y.o.-year-old patient lying in the bed with no acute distress.  EYES: Pupils equal, round, reactive to light and accommodation. No scleral icterus. Extraocular muscles intact.  HEENT: Head atraumatic, normocephalic. Oropharynx and nasopharynx clear.  NECK:  Supple, no jugular venous distention. No thyroid enlargement, no tenderness.  LUNGS: Normal breath sounds bilaterally, no wheezing, rales,rhonchi or crepitation. No use of accessory muscles of respiration.  CARDIOVASCULAR: S1, S2 normal. No murmurs, rubs, or gallops.  ABDOMEN: Soft, non-tender, non-distended. Bowel sounds present. No organomegaly or mass.  EXTREMITIES: No pedal edema, cyanosis, or clubbing.  NEUROLOGIC: Cranial nerves II through XII are intact. Muscle strength 5/5 in all extremities. Sensation intact. Gait not checked.  PSYCHIATRIC: The patient is alert and oriented x 3.  SKIN: No obvious rash, lesion, or ulcer.  DATA REVIEW:   CBC Recent Labs  Lab 06/28/19 0530  WBC 8.7  HGB 11.9*  HCT 35.4*  PLT 287    Chemistries  Recent Labs  Lab 06/28/19 0530  NA 141  K 4.5  CL 111  CO2 23  GLUCOSE 76  BUN 34*  CREATININE 1.37*  CALCIUM 8.2*     Follow-up Information    Juluis Pitch, MD. Schedule an appointment as soon as possible for a visit in 5 days.   Specialty: Family Medicine Contact information: [address] [address] [phone]           Management plans discussed with the patient, family and they are in agreement.  CODE STATUS: Prior   TOTAL TIME TAKING CARE OF THIS PATIENT: 45 minutes.    Max Sane M.D on 06/30/2019 at  11:39 AM  Between 7am to 6pm - Pager - [phone]  After 6pm go to [email or web address] - Proofreader  [facility]  Office  [phone]  CC: Primary care physician; Juluis Pitch, MD   Note: This dictation was prepared with Dragon dictation along with smaller phrase technology. Any transcriptional errors that result from this process are unintentional.

## 2020-10-05 ENCOUNTER — Emergency Department: Payer: Medicare Other

## 2020-10-05 ENCOUNTER — Other Ambulatory Visit: Payer: Self-pay

## 2020-10-05 ENCOUNTER — Emergency Department
Admission: EM | Admit: 2020-10-05 | Discharge: 2020-10-05 | Disposition: A | Discharge status: Home / Self Care | Payer: Medicare Other | Attending: Student in an Organized Health Care Education/Training Program | Admitting: Student in an Organized Health Care Education/Training Program

## 2020-10-05 ENCOUNTER — Encounter: Payer: Self-pay | Admitting: Emergency Medicine

## 2020-10-05 DIAGNOSIS — F17210 Nicotine dependence, cigarettes, uncomplicated: Secondary | ICD-10-CM | POA: Diagnosis not present

## 2020-10-05 DIAGNOSIS — S0990XA Unspecified injury of head, initial encounter: Secondary | ICD-10-CM | POA: Diagnosis not present

## 2020-10-05 DIAGNOSIS — S40212A Abrasion of left shoulder, initial encounter: Secondary | ICD-10-CM | POA: Insufficient documentation

## 2020-10-05 DIAGNOSIS — S46002A Unspecified injury of muscle(s) and tendon(s) of the rotator cuff of left shoulder, initial encounter: Secondary | ICD-10-CM | POA: Insufficient documentation

## 2020-10-05 DIAGNOSIS — W19XXXA Unspecified fall, initial encounter: Secondary | ICD-10-CM | POA: Insufficient documentation

## 2020-10-05 DIAGNOSIS — Y92002 Bathroom of unspecified non-institutional (private) residence single-family (private) house as the place of occurrence of the external cause: Secondary | ICD-10-CM | POA: Insufficient documentation

## 2020-10-05 DIAGNOSIS — I10 Essential (primary) hypertension: Secondary | ICD-10-CM | POA: Insufficient documentation

## 2020-10-05 DIAGNOSIS — Z7982 Long term (current) use of aspirin: Secondary | ICD-10-CM | POA: Insufficient documentation

## 2020-10-05 DIAGNOSIS — E119 Type 2 diabetes mellitus without complications: Secondary | ICD-10-CM | POA: Insufficient documentation

## 2020-10-05 DIAGNOSIS — Z79899 Other long term (current) drug therapy: Secondary | ICD-10-CM | POA: Diagnosis not present

## 2020-10-05 NOTE — Discharge Instructions (Signed)
I suspect that you have torn your rotator cuff or the muscle on your left shoulder from your fall.  Please call orthopedics in the morning for a follow-up appointment this week.  Please also call your primary care doctor for a follow-up appointment this week.

## 2020-10-05 NOTE — ED Provider Notes (Signed)
[facility] Emergency Department Provider Note  ____________________________________________  Time seen: Approximately 12:33 PM  I have reviewed the triage vital signs and the nursing notes.   HISTORY  Chief Complaint Fall and Shoulder Pain    HPI George [name] is a 86 y.o. male that presents to the emergency department for evaluation after a fall 4 days ago.  Patient states that he got up in the middle of the night to use the bathroom and hit his toe on the table next to his mantel, which caused him to fall.  He landed directly on his left shoulder.  He has some bruising on his left shoulder.  At first it was painful and now he has difficulty raising his shoulder up.  He did not have any dizziness, headache, chest pain, or any additional symptoms prior to the fall.  He is confident that he caught his foot on the table, causing him to trip.  He is not on any blood thinners.  He did not hit his head or lose consciousness. He was able to get himself back up. No headache, dizziness, visual changes, neck pain, shortness of breath, chest pain.   Past Medical History:  Diagnosis Date  . Cancer ([place])   . Diabetes mellitus without complication [facility])     Patient Active Problem List   Diagnosis Date Noted  . Community acquired pneumonia 06/25/2019  . Pneumonia 06/25/2019  . Foot pain 07/11/2018  . Urinary urgency 06/27/2017  . Erectile dysfunction 06/27/2017  . Stage 3 acute kidney injury ([place]) 06/27/2017  . Hypogonadism in male 06/27/2017  . Vomiting 02/09/2016  . Slurred speech 02/09/2016  . Nausea and vomiting 02/09/2016  . Lower abdominal pain 02/09/2016  . Tobacco abuse 02/09/2016  . Essential hypertension 02/09/2016  . GERD (gastroesophageal reflux disease) 02/09/2016    Past Surgical History:  Procedure Laterality Date  . CARDIAC CATHETERIZATION     has a stent    Prior to Admission medications   Medication Sig Start Date End Date Taking?  Authorizing Provider  aspirin 81 MG chewable tablet Chew 81 mg by mouth daily.    [provider]  cyanocobalamin (,VITAMIN B-12,) 1000 MCG/ML injection Inject 1,000 mcg into the muscle every 30 (thirty) days.    [provider]  doxazosin (CARDURA) 4 MG tablet Take 4 mg by mouth at bedtime.    [provider]  lisinopril (PRINIVIL,ZESTRIL) 20 MG tablet Take 20 mg by mouth 2 (two) times daily.    [provider]  omeprazole (PRILOSEC) 20 MG capsule Take 20 mg by mouth daily.    [provider]  sildenafil (REVATIO) 20 MG tablet Take 40-100 mg by mouth as needed (for ED).     [provider]  simvastatin (ZOCOR) 20 MG tablet Take 10 mg by mouth at bedtime.    [provider]  testosterone cypionate (DEPOTESTOSTERONE CYPIONATE) 200 MG/ML injection Inject 200 mg into the muscle every 14 (fourteen) days.    [provider]  verapamil (CALAN-SR) 240 MG CR tablet Take 240 mg by mouth 2 (two) times daily.    [provider]    Allergies Patient has no known allergies.  Family History  Problem Relation Age of Onset  . Prostate cancer Neg Hx   . Bladder Cancer Neg Hx   . Kidney cancer Neg Hx     Social History Social History   Tobacco Use  . Smoking status: Current Every Day Smoker    Packs/day:  1.00    Types: Cigarettes  . Smokeless tobacco: Never Used  Substance Use Topics  . Alcohol use: Yes    Comment: occ  . Drug use: No     Review of Systems  Cardiovascular: No chest pain. Respiratory: No SOB. Gastrointestinal: No abdominal pain.  No nausea, no vomiting.  Musculoskeletal: Positive for shoulder pain. Skin: Negative for rash, abrasions, lacerations. Positive for ecchymosis. Neurological: Negative for headaches, numbness or tingling. No dizziness.   ____________________________________________   PHYSICAL EXAM:  VITAL SIGNS: ED Triage Vitals  Enc Vitals Group     BP 10/05/20 1122 124/77      Pulse Rate 10/05/20 1122 95     Resp 10/05/20 1122 16     Temp 10/05/20 1122 98.2 F (36.8 C)     Temp Source 10/05/20 1122 Oral     SpO2 10/05/20 1122 94 %     Weight 10/05/20 1120 178 lb (80.7 kg)     Height 10/05/20 1120 5\' 10"  (1.778 m)     Head Circumference --      Peak Flow --      Pain Score 10/05/20 1120 0     Pain Loc --      Pain Edu? --      Excl. in [place]? --      Constitutional: Alert and oriented. Well appearing and in no acute distress. Eyes: Conjunctivae are normal. PERRL. EOMI. Head: Atraumatic. ENT:      Ears:      Nose: No congestion/rhinnorhea.      Mouth/Throat: Mucous membranes are moist.  Neck: No stridor.  No cervical spine tenderness to palpation. Cardiovascular: Normal rate, regular rhythm.  Good peripheral circulation.  Symmetric radial pulses bilaterally. Respiratory: Normal respiratory effort without tachypnea or retractions. Lungs CTAB. Good air entry to the bases with no decreased or absent breath sounds. Gastrointestinal: Bowel sounds 4 quadrants. Soft and nontender to palpation. No guarding or rigidity. No palpable masses. No distention.  Musculoskeletal: Full range of motion to all extremities. No gross deformities appreciated.  Mild ecchymosis to anterior shoulder.  Able to abduct and flex left shoulder to 45 degrees but difficulty raising shoulder further. Full ROM of elbow, wrist and hand. Grip strength in tact.  Neurologic:  Normal speech and language. No gross focal neurologic deficits are appreciated.  Skin:  Skin is warm, dry. Area of ecchymosis to anterior mid biceps. Area of ecchymosis to lateral shoulder. Psychiatric: Mood and affect are normal. Speech and behavior are normal. Patient exhibits appropriate insight and judgement.   ____________________________________________   LABS (all labs ordered are listed, but only abnormal results are displayed)  Labs Reviewed - No data to  display ____________________________________________  EKG   ____________________________________________  RADIOLOGY Robinette Haines, personally viewed and evaluated these images (plain radiographs) as part of my medical decision making, as well as reviewing the written report by the radiologist.  CT Head Wo Contrast  Result Date: 10/05/2020 CLINICAL DATA:  Fall in the bathroom 2-3 days ago EXAM: CT HEAD WITHOUT CONTRAST CT CERVICAL SPINE WITHOUT CONTRAST TECHNIQUE: Multidetector CT imaging of the head and cervical spine was performed following the standard protocol without intravenous contrast. Multiplanar CT image reconstructions of the cervical spine were also generated. COMPARISON:  02/08/2016 FINDINGS: CT HEAD FINDINGS Brain: No evidence of acute infarction, hemorrhage, hydrocephalus, extra-axial collection or mass lesion/mass effect. Vascular: No hyperdense vessel or unexpected calcification. Skull: Normal. Negative for fracture or focal lesion. Sinuses/Orbits: No acute finding. Other: None. CT CERVICAL  SPINE FINDINGS Alignment: Normal. Skull base and vertebrae: No acute fracture. No primary bone lesion or focal pathologic process. Soft tissues and spinal canal: No prevertebral fluid or swelling. No visible canal hematoma. Disc levels: Generally mild multilevel disc space height loss and osteophytosis throughout, moderate at C6-C7. Upper chest: Negative. Other: None. IMPRESSION: 1. No acute intracranial pathology. 2. No fracture or static subluxation of the cervical spine. Electronically Signed   By: Eddie Candle M.D.   On: 10/05/2020 13:33   CT Cervical Spine Wo Contrast  Result Date: 10/05/2020 CLINICAL DATA:  Fall in the bathroom 2-3 days ago EXAM: CT HEAD WITHOUT CONTRAST CT CERVICAL SPINE WITHOUT CONTRAST TECHNIQUE: Multidetector CT imaging of the head and cervical spine was performed following the standard protocol without intravenous contrast. Multiplanar CT image reconstructions of  the cervical spine were also generated. COMPARISON:  02/08/2016 FINDINGS: CT HEAD FINDINGS Brain: No evidence of acute infarction, hemorrhage, hydrocephalus, extra-axial collection or mass lesion/mass effect. Vascular: No hyperdense vessel or unexpected calcification. Skull: Normal. Negative for fracture or focal lesion. Sinuses/Orbits: No acute finding. Other: None. CT CERVICAL SPINE FINDINGS Alignment: Normal. Skull base and vertebrae: No acute fracture. No primary bone lesion or focal pathologic process. Soft tissues and spinal canal: No prevertebral fluid or swelling. No visible canal hematoma. Disc levels: Generally mild multilevel disc space height loss and osteophytosis throughout, moderate at C6-C7. Upper chest: Negative. Other: None. IMPRESSION: 1. No acute intracranial pathology. 2. No fracture or static subluxation of the cervical spine. Electronically Signed   By: Eddie Candle M.D.   On: 10/05/2020 13:33   DG Shoulder Left  Result Date: 10/05/2020 CLINICAL DATA:  Decreased range of motion of the left shoulder EXAM: LEFT SHOULDER - 2+ VIEW COMPARISON:  None. FINDINGS: No acute fracture or dislocation. High-riding humeral head relative to the glenoid suggesting underlying rotator cuff insufficiency. Mild degenerative changes of the glenohumeral and acromioclavicular joints. No focal soft tissue swelling. IMPRESSION: 1. Mild degenerative changes of the left shoulder without evidence of acute fracture or dislocation. 2. High-riding left humeral head relative to the glenoid suggesting underlying rotator cuff insufficiency. Electronically Signed   By: Davina Poke D.O.   On: 10/05/2020 12:57   DG Humerus Left  Result Date: 10/05/2020 CLINICAL DATA:  Left shoulder and arm pain after fall 3 days ago. EXAM: LEFT HUMERUS - 2+ VIEW COMPARISON:  None. FINDINGS: Exam demonstrates no evidence of acute fracture or dislocation. Mild degenerative changes over the left shoulder. IMPRESSION: 1. No acute  findings. 2. Mild degenerative change of the left shoulder. Electronically Signed   By: Marin Olp M.D.   On: 10/05/2020 15:17    ____________________________________________    PROCEDURES  Procedure(s) performed:    Procedures    Medications - No data to display   ____________________________________________   INITIAL IMPRESSION / ASSESSMENT AND PLAN / ED COURSE  Pertinent labs & imaging results that were available during my care of the patient were reviewed by me and considered in my medical decision making (see chart for details).  Review of the [place] CSRS was performed in accordance of the [place] prior to dispensing any controlled drugs.     Patient presented to the emergency department for evaluation after a mechanical fall.  Vital signs and exam are reassuring.  CT head and cervical spine are negative for acute abnormalities.  Very low suspicion for CVA, as patient is confident that he tripped on the mantle, which caused his fall.  He landed directly on his  left shoulder, which has bruising to support this.  His shoulder x-ray is negative for fracture but does show a high riding left humeral head suggesting underlying rotator cuff insufficiency.  I suspect that patient does have a rotator cuff or muscle tear.  Patient was given a sling.  Patient is to follow up with primary care and orthopedics as directed. Patient is given ED precautions to return to the ED for any worsening or new symptoms.  George [name] was evaluated in Emergency Department on 10/06/2020 for the symptoms described in the history of present illness. He was evaluated in the context of the global COVID-19 pandemic, which necessitated consideration that the patient might be at risk for infection with the SARS-CoV-2 virus that causes COVID-19. Institutional protocols and algorithms that pertain to the evaluation of patients at risk for COVID-19 are in a state of rapid change based on information released by  regulatory bodies including the CDC and federal and state organizations. These policies and algorithms were followed during the patient's care in the ED.   ____________________________________________  FINAL CLINICAL IMPRESSION(S) / ED DIAGNOSES  Final diagnoses:  Injury of left rotator cuff, initial encounter      NEW MEDICATIONS STARTED DURING THIS VISIT:  ED Discharge Orders    None          This chart was dictated using voice recognition software/Dragon. Despite best efforts to proofread, errors can occur which can change the meaning. Any change was purely unintentional.    Laban Emperor, PA-C 10/06/20 0726    Merlyn Lot, MD 10/10/20 [phone]

## 2020-10-05 NOTE — ED Triage Notes (Signed)
Pt to ED via POV, pt states that he fell 2-3 days ago, pt got up in the middle of the night to use the bathroom and hit his toe on the bed side table and it caused him to fall. Pt states that he landed on his left shoulder. Pt states that he did not hit his head. Pt states that he cannot lift his left should past a certain point and wants to get it checked. Pt is in NAD.

## 2020-10-05 NOTE — ED Notes (Signed)
Pt reports that he fell on left shoulder 3 days ago and since he has been unable to use left arm - He has decreased ROM

## 2020-10-14 ENCOUNTER — Other Ambulatory Visit: Payer: Self-pay | Admitting: Orthopedic Surgery

## 2020-10-14 DIAGNOSIS — S46012A Strain of muscle(s) and tendon(s) of the rotator cuff of left shoulder, initial encounter: Secondary | ICD-10-CM

## 2020-10-19 ENCOUNTER — Other Ambulatory Visit: Payer: Self-pay

## 2020-10-19 ENCOUNTER — Ambulatory Visit
Admission: RE | Admit: 2020-10-19 | Discharge: 2020-10-19 | Disposition: A | Discharge status: Home / Self Care | Payer: Medicare Other | Source: Ambulatory Visit | Attending: Orthopedic Surgery | Admitting: Orthopedic Surgery

## 2020-10-19 DIAGNOSIS — S46012A Strain of muscle(s) and tendon(s) of the rotator cuff of left shoulder, initial encounter: Secondary | ICD-10-CM

## 2020-10-21 ENCOUNTER — Ambulatory Visit: Payer: Medicare Other

## 2020-11-04 ENCOUNTER — Emergency Department: Payer: Medicare Other

## 2020-11-04 ENCOUNTER — Other Ambulatory Visit: Payer: Self-pay

## 2020-11-04 ENCOUNTER — Inpatient Hospital Stay
Admission: EM | Admit: 2020-11-04 | Discharge: 2020-11-09 | DRG: 641 | Disposition: A | Discharge status: Home Health | Payer: Medicare Other | Attending: Internal Medicine | Admitting: Internal Medicine

## 2020-11-04 ENCOUNTER — Encounter: Payer: Self-pay | Admitting: Emergency Medicine

## 2020-11-04 DIAGNOSIS — E1122 Type 2 diabetes mellitus with diabetic chronic kidney disease: Secondary | ICD-10-CM | POA: Diagnosis present

## 2020-11-04 DIAGNOSIS — N400 Enlarged prostate without lower urinary tract symptoms: Secondary | ICD-10-CM | POA: Diagnosis not present

## 2020-11-04 DIAGNOSIS — N179 Acute kidney failure, unspecified: Secondary | ICD-10-CM | POA: Diagnosis present

## 2020-11-04 DIAGNOSIS — E875 Hyperkalemia: Secondary | ICD-10-CM | POA: Diagnosis present

## 2020-11-04 DIAGNOSIS — J449 Chronic obstructive pulmonary disease, unspecified: Secondary | ICD-10-CM | POA: Diagnosis present

## 2020-11-04 DIAGNOSIS — F17210 Nicotine dependence, cigarettes, uncomplicated: Secondary | ICD-10-CM | POA: Diagnosis present

## 2020-11-04 DIAGNOSIS — Z7982 Long term (current) use of aspirin: Secondary | ICD-10-CM | POA: Diagnosis not present

## 2020-11-04 DIAGNOSIS — Z79899 Other long term (current) drug therapy: Secondary | ICD-10-CM | POA: Diagnosis not present

## 2020-11-04 DIAGNOSIS — R338 Other retention of urine: Secondary | ICD-10-CM | POA: Diagnosis present

## 2020-11-04 DIAGNOSIS — Z20822 Contact with and (suspected) exposure to covid-19: Secondary | ICD-10-CM | POA: Diagnosis present

## 2020-11-04 DIAGNOSIS — I10 Essential (primary) hypertension: Secondary | ICD-10-CM | POA: Diagnosis present

## 2020-11-04 DIAGNOSIS — E119 Type 2 diabetes mellitus without complications: Secondary | ICD-10-CM

## 2020-11-04 DIAGNOSIS — N189 Chronic kidney disease, unspecified: Secondary | ICD-10-CM

## 2020-11-04 DIAGNOSIS — I4510 Unspecified right bundle-branch block: Secondary | ICD-10-CM | POA: Diagnosis present

## 2020-11-04 DIAGNOSIS — I129 Hypertensive chronic kidney disease with stage 1 through stage 4 chronic kidney disease, or unspecified chronic kidney disease: Secondary | ICD-10-CM | POA: Diagnosis present

## 2020-11-04 DIAGNOSIS — N1832 Chronic kidney disease, stage 3b: Secondary | ICD-10-CM | POA: Diagnosis present

## 2020-11-04 DIAGNOSIS — Z955 Presence of coronary angioplasty implant and graft: Secondary | ICD-10-CM | POA: Diagnosis not present

## 2020-11-04 DIAGNOSIS — I2510 Atherosclerotic heart disease of native coronary artery without angina pectoris: Secondary | ICD-10-CM | POA: Diagnosis present

## 2020-11-04 DIAGNOSIS — N139 Obstructive and reflux uropathy, unspecified: Secondary | ICD-10-CM | POA: Diagnosis not present

## 2020-11-04 DIAGNOSIS — K219 Gastro-esophageal reflux disease without esophagitis: Secondary | ICD-10-CM | POA: Diagnosis present

## 2020-11-04 DIAGNOSIS — R300 Dysuria: Secondary | ICD-10-CM | POA: Diagnosis not present

## 2020-11-04 DIAGNOSIS — E1129 Type 2 diabetes mellitus with other diabetic kidney complication: Secondary | ICD-10-CM

## 2020-11-04 DIAGNOSIS — N390 Urinary tract infection, site not specified: Secondary | ICD-10-CM | POA: Diagnosis not present

## 2020-11-04 DIAGNOSIS — N138 Other obstructive and reflux uropathy: Secondary | ICD-10-CM | POA: Diagnosis present

## 2020-11-04 DIAGNOSIS — E785 Hyperlipidemia, unspecified: Secondary | ICD-10-CM | POA: Diagnosis present

## 2020-11-04 DIAGNOSIS — N401 Enlarged prostate with lower urinary tract symptoms: Secondary | ICD-10-CM | POA: Diagnosis present

## 2020-11-04 LAB — CBC WITH DIFFERENTIAL/PLATELET
Abs Immature Granulocytes: 0.03 10*3/uL (ref 0.00–0.07)
Basophils Absolute: 0.1 10*3/uL (ref 0.0–0.1)
Basophils Relative: 1 %
Eosinophils Absolute: 0.3 10*3/uL (ref 0.0–0.5)
Eosinophils Relative: 3 %
HCT: 42.4 % (ref 39.0–52.0)
Hemoglobin: 14.2 g/dL (ref 13.0–17.0)
Immature Granulocytes: 0 %
Lymphocytes Relative: 11 %
Lymphs Abs: 1.2 10*3/uL (ref 0.7–4.0)
MCH: 30.5 pg (ref 26.0–34.0)
MCHC: 33.5 g/dL (ref 30.0–36.0)
MCV: 91 fL (ref 80.0–100.0)
Monocytes Absolute: 0.8 10*3/uL (ref 0.1–1.0)
Monocytes Relative: 7 %
Neutro Abs: 8.3 10*3/uL — ABNORMAL HIGH (ref 1.7–7.7)
Neutrophils Relative %: 78 %
Platelets: 254 10*3/uL (ref 150–400)
RBC: 4.66 MIL/uL (ref 4.22–5.81)
RDW: 13.7 % (ref 11.5–15.5)
WBC: 10.7 10*3/uL — ABNORMAL HIGH (ref 4.0–10.5)
nRBC: 0 % (ref 0.0–0.2)

## 2020-11-04 LAB — URINALYSIS, COMPLETE (UACMP) WITH MICROSCOPIC
Bacteria, UA: NONE SEEN
Bilirubin Urine: NEGATIVE
Glucose, UA: NEGATIVE mg/dL
Ketones, ur: NEGATIVE mg/dL
Leukocytes,Ua: NEGATIVE
Nitrite: NEGATIVE
Protein, ur: 30 mg/dL — AB
RBC / HPF: >50 RBC/hpf — ABNORMAL HIGH (ref 0–5)
Specific Gravity, Urine: 1.012 (ref 1.005–1.030)
pH: 5 (ref 5.0–8.0)

## 2020-11-04 LAB — BASIC METABOLIC PANEL
Anion gap: 10 (ref 5–15)
Anion gap: 13 (ref 5–15)
Anion gap: 11 (ref 5–15)
Anion gap: 10 (ref 5–15)
BUN: 92 mg/dL — ABNORMAL HIGH (ref 8–23)
BUN: 83 mg/dL — ABNORMAL HIGH (ref 8–23)
BUN: 77 mg/dL — ABNORMAL HIGH (ref 8–23)
BUN: 70 mg/dL — ABNORMAL HIGH (ref 8–23)
CO2: 17 mmol/L — ABNORMAL LOW (ref 22–32)
CO2: 17 mmol/L — ABNORMAL LOW (ref 22–32)
CO2: 18 mmol/L — ABNORMAL LOW (ref 22–32)
CO2: 21 mmol/L — ABNORMAL LOW (ref 22–32)
Calcium: 9.4 mg/dL (ref 8.9–10.3)
Calcium: 9.3 mg/dL (ref 8.9–10.3)
Calcium: 9.2 mg/dL (ref 8.9–10.3)
Calcium: 8.9 mg/dL (ref 8.9–10.3)
Chloride: 113 mmol/L — ABNORMAL HIGH (ref 98–111)
Chloride: 115 mmol/L — ABNORMAL HIGH (ref 98–111)
Chloride: 114 mmol/L — ABNORMAL HIGH (ref 98–111)
Chloride: 111 mmol/L (ref 98–111)
Creatinine, Ser: 2.8 mg/dL — ABNORMAL HIGH (ref 0.61–1.24)
Creatinine, Ser: 2.43 mg/dL — ABNORMAL HIGH (ref 0.61–1.24)
Creatinine, Ser: 2.3 mg/dL — ABNORMAL HIGH (ref 0.61–1.24)
Creatinine, Ser: 2.19 mg/dL — ABNORMAL HIGH (ref 0.61–1.24)
GFR, Estimated: 21 mL/min — ABNORMAL LOW (ref >60)
GFR, Estimated: 25 mL/min — ABNORMAL LOW (ref >60)
GFR, Estimated: 27 mL/min — ABNORMAL LOW (ref >60)
GFR, Estimated: 29 mL/min — ABNORMAL LOW (ref >60)
Glucose, Bld: 102 mg/dL — ABNORMAL HIGH (ref 70–99)
Glucose, Bld: 86 mg/dL (ref 70–99)
Glucose, Bld: 98 mg/dL (ref 70–99)
Glucose, Bld: 101 mg/dL — ABNORMAL HIGH (ref 70–99)
Potassium: 7.2 mmol/L (ref 3.5–5.1)
Potassium: 6.7 mmol/L (ref 3.5–5.1)
Potassium: 6.4 mmol/L (ref 3.5–5.1)
Potassium: 5.6 mmol/L — ABNORMAL HIGH (ref 3.5–5.1)
Sodium: 140 mmol/L (ref 135–145)
Sodium: 145 mmol/L (ref 135–145)
Sodium: 143 mmol/L (ref 135–145)
Sodium: 142 mmol/L (ref 135–145)

## 2020-11-04 LAB — POTASSIUM: Potassium: 7.3 mmol/L (ref 3.5–5.1)

## 2020-11-04 LAB — CBG MONITORING, ED
Glucose-Capillary: 92 mg/dL (ref 70–99)
Glucose-Capillary: 83 mg/dL (ref 70–99)
Glucose-Capillary: 196 mg/dL — ABNORMAL HIGH (ref 70–99)
Glucose-Capillary: 83 mg/dL (ref 70–99)

## 2020-11-04 LAB — SARS CORONAVIRUS 2 BY RT PCR (HOSPITAL ORDER, PERFORMED IN ~~LOC~~ HOSPITAL LAB): SARS Coronavirus 2: NEGATIVE

## 2020-11-04 LAB — HEMOGLOBIN A1C
Hgb A1c MFr Bld: 5.6 % (ref 4.8–5.6)
Mean Plasma Glucose: 114.02 mg/dL

## 2020-11-04 LAB — TROPONIN I (HIGH SENSITIVITY): Troponin I (High Sensitivity): 12 ng/L (ref <18)

## 2020-11-04 MED ORDER — ENOXAPARIN SODIUM 30 MG/0.3ML ~~LOC~~ SOLN
30.0000 mg | SUBCUTANEOUS | Status: DC
  Administered 2020-11-04 – 2020-11-08 (×5): 30 mg via SUBCUTANEOUS
  Filled 2020-11-04 (×6): qty 0.3

## 2020-11-04 MED ORDER — HYDROCODONE-ACETAMINOPHEN 5-325 MG PO TABS
1.0000 | ORAL_TABLET | ORAL | Status: DC | PRN
  Administered 2020-11-04: 1 via ORAL
  Filled 2020-11-04: qty 1

## 2020-11-04 MED ORDER — SODIUM BICARBONATE 8.4 % IV SOLN
50.0000 meq | Freq: Once | INTRAVENOUS | Status: AC
  Administered 2020-11-04: 50 meq via INTRAVENOUS
  Filled 2020-11-04: qty 50

## 2020-11-04 MED ORDER — DEXTROSE 50 % IV SOLN
1.0000 | Freq: Once | INTRAVENOUS | Status: AC
  Administered 2020-11-04: 50 mL via INTRAVENOUS
  Filled 2020-11-04: qty 50

## 2020-11-04 MED ORDER — DOXAZOSIN MESYLATE 4 MG PO TABS
4.0000 mg | ORAL_TABLET | Freq: Every day | ORAL | Status: DC
  Administered 2020-11-04 – 2020-11-08 (×5): 4 mg via ORAL
  Filled 2020-11-04 (×7): qty 1

## 2020-11-04 MED ORDER — POTASSIUM CHLORIDE 20 MEQ PO PACK: 40.0000 meq | PACK | Freq: Every day | ORAL | Status: DC

## 2020-11-04 MED ORDER — ACETAMINOPHEN 325 MG PO TABS: 650.0000 mg | ORAL_TABLET | Freq: Four times a day (QID) | ORAL | Status: DC | PRN

## 2020-11-04 MED ORDER — LIDOCAINE HCL URETHRAL/MUCOSAL 2 % EX GEL
1.0000 "application " | Freq: Once | CUTANEOUS | Status: AC
  Administered 2020-11-04: 1 via URETHRAL
  Filled 2020-11-04: qty 10

## 2020-11-04 MED ORDER — INSULIN ASPART 100 UNIT/ML ~~LOC~~ SOLN
8.0000 [IU] | Freq: Once | SUBCUTANEOUS | Status: AC
  Administered 2020-11-04: 8 [IU] via INTRAVENOUS
  Filled 2020-11-04: qty 1

## 2020-11-04 MED ORDER — SIMVASTATIN 20 MG PO TABS
10.0000 mg | ORAL_TABLET | Freq: Every day | ORAL | Status: DC
  Administered 2020-11-04 – 2020-11-08 (×5): 10 mg via ORAL
  Filled 2020-11-04 (×5): qty 1

## 2020-11-04 MED ORDER — SODIUM CHLORIDE 0.9 % IV BOLUS
1000.0000 mL | Freq: Once | INTRAVENOUS | Status: AC
  Administered 2020-11-04: 1000 mL via INTRAVENOUS

## 2020-11-04 MED ORDER — INSULIN ASPART 100 UNIT/ML ~~LOC~~ SOLN
0.0000 [IU] | Freq: Three times a day (TID) | SUBCUTANEOUS | Status: DC
  Administered 2020-11-04: 3 [IU] via SUBCUTANEOUS
  Filled 2020-11-04: qty 1

## 2020-11-04 MED ORDER — CALCIUM GLUCONATE-NACL 1-0.675 GM/50ML-% IV SOLN
1.0000 g | Freq: Once | INTRAVENOUS | Status: AC
  Administered 2020-11-04: 1000 mg via INTRAVENOUS
  Filled 2020-11-04: qty 50

## 2020-11-04 MED ORDER — SODIUM BICARBONATE 8.4 % IV SOLN
INTRAVENOUS | Status: DC
  Filled 2020-11-04: qty 150
  Filled 2020-11-04 (×8): qty 850
  Filled 2020-11-04: qty 150
  Filled 2020-11-04 (×2): qty 850
  Filled 2020-11-04: qty 150
  Filled 2020-11-04 (×3): qty 850
  Filled 2020-11-04: qty 150
  Filled 2020-11-04: qty 850

## 2020-11-04 MED ORDER — ENOXAPARIN SODIUM 40 MG/0.4ML ~~LOC~~ SOLN: 40.0000 mg | SUBCUTANEOUS | Status: DC

## 2020-11-04 MED ORDER — ACETAMINOPHEN 650 MG RE SUPP: 650.0000 mg | Freq: Four times a day (QID) | RECTAL | Status: DC | PRN

## 2020-11-04 MED ORDER — COLCHICINE 0.6 MG PO TABS
0.6000 mg | ORAL_TABLET | Freq: Two times a day (BID) | ORAL | Status: DC
  Filled 2020-11-04: qty 1

## 2020-11-04 MED ORDER — ONDANSETRON HCL 4 MG/2ML IJ SOLN
4.0000 mg | Freq: Four times a day (QID) | INTRAMUSCULAR | Status: DC | PRN
  Administered 2020-11-04 – 2020-11-05 (×2): 4 mg via INTRAVENOUS
  Filled 2020-11-04 (×2): qty 2

## 2020-11-04 MED ORDER — MORPHINE SULFATE (PF) 2 MG/ML IV SOLN: 2.0000 mg | INTRAVENOUS | Status: DC | PRN

## 2020-11-04 MED ORDER — SODIUM CHLORIDE 0.9 % IV SOLN: INTRAVENOUS | Status: DC

## 2020-11-04 MED ORDER — INSULIN ASPART 100 UNIT/ML ~~LOC~~ SOLN: 0.0000 [IU] | Freq: Every day | SUBCUTANEOUS | Status: DC

## 2020-11-04 MED ORDER — MORPHINE SULFATE (PF) 2 MG/ML IV SOLN
2.0000 mg | Freq: Once | INTRAVENOUS | Status: AC
  Administered 2020-11-04: 2 mg via INTRAVENOUS
  Filled 2020-11-04: qty 1

## 2020-11-04 MED ORDER — SODIUM POLYSTYRENE SULFONATE 15 GM/60ML PO SUSP
30.0000 g | Freq: Once | ORAL | Status: AC
  Administered 2020-11-04: 30 g via ORAL
  Filled 2020-11-04: qty 120

## 2020-11-04 MED ORDER — SODIUM POLYSTYRENE SULFONATE 15 GM/60ML PO SUSP
15.0000 g | Freq: Once | ORAL | Status: DC
  Filled 2020-11-04: qty 60

## 2020-11-04 MED ORDER — LORAZEPAM 2 MG/ML IJ SOLN
0.2500 mg | Freq: Once | INTRAMUSCULAR | Status: AC
  Administered 2020-11-04: 0.25 mg via INTRAVENOUS
  Filled 2020-11-04: qty 1

## 2020-11-04 MED ORDER — COLCHICINE 0.6 MG PO TABS
0.3000 mg | ORAL_TABLET | Freq: Every day | ORAL | Status: DC
Start: 2020-11-04 — End: 2020-11-09
  Administered 2020-11-04 – 2020-11-09 (×6): 0.3 mg via ORAL
  Filled 2020-11-04 (×4): qty 0.5
  Filled 2020-11-04 (×2): qty 1
  Filled 2020-11-04: qty 0.5
  Filled 2020-11-04 (×2): qty 1
  Filled 2020-11-04: qty 0.5

## 2020-11-04 MED ORDER — ASPIRIN 81 MG PO CHEW
81.0000 mg | CHEWABLE_TABLET | Freq: Every day | ORAL | Status: DC
  Administered 2020-11-04 – 2020-11-09 (×6): 81 mg via ORAL
  Filled 2020-11-04 (×6): qty 1

## 2020-11-04 MED ORDER — PANTOPRAZOLE SODIUM 40 MG PO TBEC
40.0000 mg | DELAYED_RELEASE_TABLET | Freq: Every day | ORAL | Status: DC
  Administered 2020-11-04 – 2020-11-09 (×6): 40 mg via ORAL
  Filled 2020-11-04 (×6): qty 1

## 2020-11-04 MED ORDER — POTASSIUM CHLORIDE 10 MEQ/100ML IV SOLN: 10.0000 meq | INTRAVENOUS | Status: DC

## 2020-11-04 MED ORDER — ONDANSETRON HCL 4 MG PO TABS
4.0000 mg | ORAL_TABLET | Freq: Four times a day (QID) | ORAL | Status: DC | PRN
  Administered 2020-11-05: 4 mg via ORAL
  Filled 2020-11-04: qty 1

## 2020-11-04 MED ORDER — PATIROMER SORBITEX CALCIUM 8.4 G PO PACK
16.8000 g | PACK | Freq: Once | ORAL | Status: AC
  Administered 2020-11-04: 16.8 g via ORAL
  Filled 2020-11-04: qty 2

## 2020-11-04 NOTE — ED Triage Notes (Addendum)
Emergency Medicine Provider Triage Evaluation Note  George [name] , a 87 y.o. male  was evaluated in triage.  Pt complains of dysuria and urinary retention.  Review of Systems  Positive: Dysuria Negative: Abdominal pain  Physical Exam  BP 133/70 (BP Location: Right Arm)   Pulse (!) 125   Temp 97.8 F (36.6 C) (Oral)   Resp 20   SpO2 96%  Gen:   Awake, moderate distress   HEENT:  Atraumatic  Resp:  Normal effort  Cardiac:  Tachycardic rate  Abd:   Nondistended, nontender  MSK:   Moves extremities without difficulty  Neuro:  Speech clear  GU:                  Circumcised male, no urethral discharge.  Medical Decision Making  Medically screening exam initiated at 2:07 AM.  Appropriate orders placed.  George [name] was informed that the remainder of the evaluation will be completed by another provider, this initial triage assessment does not replace that evaluation, and the importance of remaining in the ED until their evaluation is complete.  Clinical Impression  87 year old male presenting with urinary retention x3 hours and dysuria.  Bladder scan has <224mL.  Patient hollering in distress occasionally which gives the appearance of bladder spasms.  Mainly complains of burning at the tip of his penis.  Will obtain basic lab work, UA.  Administer Urojet, low-dose IV Ativan.   Paulette Blanch, MD 11/04/20 0210   ----------------------------------------- 2:53 AM on 11/04/2020 -----------------------------------------  Critical potassium noted.  Brought straight back to a room.    Paulette Blanch, MD 11/04/20 [phone]

## 2020-11-04 NOTE — ED Notes (Signed)
Pt vomited small amount of emesis, now dry heaving. PRN nausea medication to be given.

## 2020-11-04 NOTE — ED Notes (Signed)
Pt given breakfast and sat up to eat; pt states he does not need help but does not want to eat at this time.

## 2020-11-04 NOTE — ED Notes (Signed)
Date and time results received: 11/04/20 8:20 AM    Test: potassium Critical Value: 6.7  Name of Provider Notified: Dr. Billie Ruddy  Secure chat, and read.

## 2020-11-04 NOTE — ED Notes (Signed)
Meal tray at bedside.  

## 2020-11-04 NOTE — ED Notes (Signed)
Patient given hearing aids.

## 2020-11-04 NOTE — H&P (Addendum)
History and Physical    George [name] KDT:[id number] DOB: September 02, 1932 DOA: 11/04/2020  PCP: Juluis Pitch, MD   Patient coming from: Home  I have personally briefly reviewed patient's old medical records in [place]  Chief Complaint: Unable to urinate  HPI: George [name] is a 87 y.o. male with medical history significant for CAD with history of PCI to LAD, COPD, DM, HTN, BPH, CKD 3b who presents to the emergency room with complaints of increasing difficulty passing urine over the past several weeks and on the night of arrival went he was having urinary frequency but passing only dribbles of urine with painful urination.  After this went on for several hours he decided to come into the emergency room to be evaluated.    Denies abdominal pain, nausea vomiting or diarrhea.  Denies cough, fever or chills. ED Course: On arrival, afebrile, BP 133/70, tachycardic at 125 with O2 sat 96% on room air on his blood work WBC slightly elevated at 10.7, potassium of 7.2, with creatinine of 2.8, up from 1.37.  Troponin was 12.  Urinalysis pending.  Covid PCR negative EKG as interpreted by me: Sinus tachycardia with RBBB.  Peak T waves not seen Imaging: Ultrasound, renal stone study showsMarked prostatic hypertrophy with an estimated prostatic volume of approximately 180 cc. Mild bladder distension  Patient had Foley catheter placed in the ER.  Hyperkalemia was treated with calcium gluconate, IV bicarb, insulin and dextrose.  Hospitalist consulted for admission.  Review of Systems: As per HPI otherwise all other systems on review of systems negative.  Limited by hearing impairment   Past Medical History:  Diagnosis Date  . Cancer ([place])   . Diabetes mellitus without complication [facility])     Past Surgical History:  Procedure Laterality Date  . CARDIAC CATHETERIZATION     has a stent     reports that he has been smoking cigarettes. He has been smoking about 1.00 pack per day. He has  never used smokeless tobacco. He reports current alcohol use. He reports that he does not use drugs.  No Known Allergies  Family History  Problem Relation Age of Onset  . Prostate cancer Neg Hx   . Bladder Cancer Neg Hx   . Kidney cancer Neg Hx       Prior to Admission medications   Medication Sig Start Date End Date Taking? Authorizing Provider  aspirin 81 MG chewable tablet Chew 81 mg by mouth daily.    [provider]  cyanocobalamin (,VITAMIN B-12,) 1000 MCG/ML injection Inject 1,000 mcg into the muscle every 30 (thirty) days.    [provider]  doxazosin (CARDURA) 4 MG tablet Take 4 mg by mouth at bedtime.    [provider]  lisinopril (PRINIVIL,ZESTRIL) 20 MG tablet Take 20 mg by mouth 2 (two) times daily.    [provider]  omeprazole (PRILOSEC) 20 MG capsule Take 20 mg by mouth daily.    [provider]  sildenafil (REVATIO) 20 MG tablet Take 40-100 mg by mouth as needed (for ED).     [provider]  simvastatin (ZOCOR) 20 MG tablet Take 10 mg by mouth at bedtime.    [provider]  testosterone cypionate (DEPOTESTOSTERONE CYPIONATE) 200 MG/ML injection Inject 200 mg into the muscle every 14 (fourteen) days.    [provider]  verapamil (CALAN-SR) 240 MG CR tablet Take 240 mg by mouth 2 (two) times daily.    [provider]  Physical Exam: Vitals:   11/04/20 0405 11/04/20 0415 11/04/20 0430 11/04/20 0445  BP: 126/73     Pulse: (!) 131 (!) 130 (!) 123 (!) 123  Resp: (!) 27 20 (!) 58 (!) 24  Temp:      TempSrc:      SpO2: 98% 100% 98% 98%     Vitals:   11/04/20 0405 11/04/20 0415 11/04/20 0430 11/04/20 0445  BP: 126/73     Pulse: (!) 131 (!) 130 (!) 123 (!) 123  Resp: (!) 27 20 (!) 58 (!) 24  Temp:      TempSrc:      SpO2: 98% 100% 98% 98%      Constitutional: Alert and oriented x 3 . Not in any apparent distress HEENT:      Head: Normocephalic and atraumatic.          Eyes: PERLA, EOMI, Conjunctivae are normal. Sclera is non-icteric.       Mouth/Throat: Mucous membranes are moist.       Neck: Supple with no signs of meningismus. Cardiovascular: Regular rate and rhythm. No murmurs, gallops, or rubs. 2+ symmetrical distal pulses are present . No JVD. No LE edema Respiratory: Respiratory effort normal .Lungs sounds clear bilaterally. No wheezes, crackles, or rhonchi.  Gastrointestinal: Soft, non tender, and non distended with positive bowel sounds.  Genitourinary: No CVA tenderness.  Foley with clear urine Musculoskeletal: Nontender with normal range of motion in all extremities. No cyanosis, or erythema of extremities. Neurologic:  Face is symmetric. Moving all extremities. No gross focal neurologic deficits . Skin: Skin is warm, dry.  No rash or ulcers Psychiatric: Mood and affect are normal    Labs on Admission: I have personally reviewed following labs and imaging studies  CBC: Recent Labs  Lab 11/04/20 0204  WBC 10.7*  NEUTROABS 8.3*  HGB 14.2  HCT 42.4  MCV 91.0  PLT 275   Basic Metabolic Panel: Recent Labs  Lab 11/04/20 0204 11/04/20 0315  NA 140  --   K 7.2* 7.3*  CL 113*  --   CO2 17*  --   GLUCOSE 102*  --   BUN 92*  --   CREATININE 2.80*  --   CALCIUM 9.4  --    GFR: CrCl cannot be calculated (Unknown ideal weight.). Liver Function Tests: No results for input(s): AST, ALT, ALKPHOS, BILITOT, PROT, ALBUMIN in the last 168 hours. No results for input(s): LIPASE, AMYLASE in the last 168 hours. No results for input(s): AMMONIA in the last 168 hours. Coagulation Profile: No results for input(s): INR, PROTIME in the last 168 hours. Cardiac Enzymes: No results for input(s): CKTOTAL, CKMB, CKMBINDEX, TROPONINI in the last 168 hours. BNP (last 3 results) No results for input(s): PROBNP in the last 8760 hours. HbA1C: No results for input(s): HGBA1C in the last 72 hours. CBG: Recent Labs  Lab 11/04/20 0426  GLUCAP 92   Lipid  Profile: No results for input(s): CHOL, HDL, LDLCALC, TRIG, CHOLHDL, LDLDIRECT in the last 72 hours. Thyroid Function Tests: No results for input(s): TSH, T4TOTAL, FREET4, T3FREE, THYROIDAB in the last 72 hours. Anemia Panel: No results for input(s): VITAMINB12, FOLATE, FERRITIN, TIBC, IRON, RETICCTPCT in the last 72 hours. Urine analysis:    Component Value Date/Time   COLORURINE YELLOW (A) 07/23/2018 0754   APPEARANCEUR CLEAR (A) 07/23/2018 0754   LABSPEC 1.012 07/23/2018 0754   PHURINE 5.0 07/23/2018 0754   GLUCOSEU NEGATIVE 07/23/2018 0754   HGBUR MODERATE (A) 07/23/2018 0754  [place] NEGATIVE 07/23/2018 [place] 07/23/2018 0754   PROTEINUR NEGATIVE 07/23/2018 0754   NITRITE NEGATIVE 07/23/2018 0754   LEUKOCYTESUR NEGATIVE 07/23/2018 0754    Radiological Exams on Admission: DG Chest Port 1 View  Result Date: 11/04/2020 CLINICAL DATA:  Hyperkalemia EXAM: PORTABLE CHEST 1 VIEW COMPARISON:  06/25/2019 FINDINGS: Interstitial prominence is again noted, unchanged. No superimposed focal pulmonary infiltrate. Pulmonary insufflation is normal and symmetric. No pneumothorax or pleural effusion. Cardiac size within normal limits. No acute bone abnormality. IMPRESSION: No active disease. Electronically Signed   By: Fidela Salisbury MD   On: 11/04/2020 04:26   CT Renal Stone Study  Result Date: 11/04/2020 CLINICAL DATA:  Urinary retention, dysuria, perineal pain EXAM: CT ABDOMEN AND PELVIS WITHOUT CONTRAST TECHNIQUE: Multidetector CT imaging of the abdomen and pelvis was performed following the standard protocol without IV contrast. COMPARISON:  06/30/2011 FINDINGS: Imaging is slightly limited by motion artifact. Lower chest: Severe emphysema with hyperinflation of the lung bases. Extensive coronary artery calcification. Global cardiac size within normal limits. Small hiatal hernia. Hepatobiliary: Cholelithiasis without pericholecystic inflammatory change. Liver unremarkable.  No intra or extrahepatic biliary ductal dilation. Pancreas: Unremarkable Spleen: Unremarkable Adrenals/Urinary Tract: Adrenal glands are unremarkable. 2 simple cysts are seen within the interpolar region of the left kidney slightly enlarged since prior examination measuring up to 2.6 cm in greatest dimension. The kidneys are otherwise unremarkable. The bladder is mildly distended. Marked prostatic hypertrophy indents the base of the bladder. Stomach/Bowel: Sigmoid colectomy has been performed. There is moderate to severe diverticulosis of the residual distal colon. The stomach, small bowel, and large bowel are otherwise unremarkable. No evidence of obstruction or focal inflammation. Appendix normal. No free intraperitoneal gas or fluid. Vascular/Lymphatic: Extensive aortoiliac atherosclerotic calcification. No aneurysm. No pathologic adenopathy within the abdomen and pelvis. Reproductive: There is marked prostatic hypertrophy noted, particularly centrally. The prostate gland has an estimated prostatic volume of approximately 180 cc. The seminal vesicles are unremarkable. Left orchiectomy has been performed. Other: The rectum is unremarkable.  No body wall hernia identified. Musculoskeletal: No acute bone abnormality within the abdomen and pelvis. Degenerative changes are seen throughout the lumbar spine. IMPRESSION: Marked prostatic hypertrophy with an estimated prostatic volume of approximately 180 cc. Mild bladder distension. Severe emphysema. Extensive coronary artery calcification. Severe distal colonic diverticulosis. No superimposed inflammatory change. Aortic Atherosclerosis (ICD10-I70.0). Electronically Signed   By: Fidela Salisbury MD   On: 11/04/2020 04:00     Assessment/Plan 86 year old male with history of CAD with history of PCI to LAD, COPD, DM, HTN, BPH, CKD 3b who presents to the emergency room with complaints of increasing difficulty passing urine over the past several weeks and associated  painful urination and on the night of arrival went for 3 hours without the ability to urinate.      Hyperkalemia   Acute kidney injury superimposed on CKD IIIb ([place])   Acute urinary retention secondary to BPH with obstruction/LUTS -Patient with hyperkalemia most likely related to AKI on CKD from obstructive uropathy. -Renal stone study showing marked prostatic hypertrophy with prostatic volume of 180 cc with mild bladder distention -For hyperkalemia: Patient received insulin and dextrose sodium bicarb and calcium gluconate in the ER.  Veltassa x1 dose given -Monitor potassium every 4h -For urinary retention -Continue Foley catheter drainage -Continue Cardura 4 mg at bedtime -Follow urinalysis to evaluate for UTI -Urology consult    Essential hypertension -Continue lisinopril pending verification    CAD with history of PCI to LAD (coronary  artery disease) -Continue simvastatin and aspirin pending verification    COPD (chronic obstructive pulmonary disease) ([place]) -DuoNebs as needed    Diabetes mellitus type 2, uncomplicated (HCC) -Sliding scale insulin coverage    DVT prophylaxis: Lovenox  Code Status: full code  Family Communication:  none  Disposition Plan: Back to previous home environment Consults called: Urology  Status:At the time of admission, it appears that the appropriate admission status for this patient is INPATIENT. This is judged to be reasonable and necessary in order to provide the required intensity of service to ensure the patient's safety given the presenting symptoms, physical exam findings, and initial radiographic and laboratory data in the context of their  Comorbid conditions.   Patient requires inpatient status due to high intensity of service, high risk for further deterioration and high frequency of surveillance required.   I certify that at the point of admission it is my clinical judgment that the patient will require inpatient hospital care spanning  beyond [place] MD [facility]     11/04/2020, 5:08 AM

## 2020-11-04 NOTE — ED Provider Notes (Signed)
[facility] Emergency Department Provider Note   ____________________________________________   None    (approximate)  I have reviewed the triage vital signs and the nursing notes.   HISTORY  Chief Complaint Urinary Retention    HPI George [name] is a 87 y.o. male brought to the ED via EMS from home with a chief complaint of urinary retention x3 hours.  States he has had urinary issues for several weeks but feels like he needs to urinate but cannot for the past 3 hours.  He is able to dribble a little urine and when he does he hollers in pain, complaining of burning at the end of his penis.  Bladder scan done in triage ~121mL.  Patient has a past medical history significant for CAD status post PCI, COPD, type 2 diabetes, hypertension, hyperlipidemia, Peyronie's disease, hypotestosteronemia.  Denies fever, cough, chest pain, shortness of breath, abdominal pain, nausea or vomiting.      Past Medical History:  Diagnosis Date  . Cancer ([place])   . Diabetes mellitus without complication [facility])     Patient Active Problem List   Diagnosis Date Noted  . Acute kidney injury superimposed on CKD IIIb ([place]) 11/04/2020  . BPH with obstruction/lower urinary tract symptoms 11/04/2020  . Hyperkalemia 11/04/2020  . Acute urinary retention 11/04/2020  . Community acquired pneumonia 06/25/2019  . Pneumonia 06/25/2019  . Foot pain 07/11/2018  . Urinary urgency 06/27/2017  . Erectile dysfunction 06/27/2017  . Stage 3 acute kidney injury ([place]) 06/27/2017  . Hypogonadism in male 06/27/2017  . Vomiting 02/09/2016  . Slurred speech 02/09/2016  . Nausea and vomiting 02/09/2016  . Lower abdominal pain 02/09/2016  . Tobacco abuse 02/09/2016  . Essential hypertension 02/09/2016  . GERD (gastroesophageal reflux disease) 02/09/2016  . Hyperlipidemia 04/22/2014  . Hypertension 04/22/2014  . COPD (chronic obstructive pulmonary disease) ([place]) 04/22/2014  . Diabetes mellitus  type 2, uncomplicated ([place]) 16/07/9603  . CAD (coronary artery disease) 09/10/1998    Past Surgical History:  Procedure Laterality Date  . CARDIAC CATHETERIZATION     has a stent    Prior to Admission medications   Medication Sig Start Date End Date Taking? Authorizing Provider  aspirin 81 MG chewable tablet Chew 81 mg by mouth daily.    [provider]  cyanocobalamin (,VITAMIN B-12,) 1000 MCG/ML injection Inject 1,000 mcg into the muscle every 30 (thirty) days.    [provider]  doxazosin (CARDURA) 4 MG tablet Take 4 mg by mouth at bedtime.    [provider]  lisinopril (PRINIVIL,ZESTRIL) 20 MG tablet Take 20 mg by mouth 2 (two) times daily.    [provider]  omeprazole (PRILOSEC) 20 MG capsule Take 20 mg by mouth daily.    [provider]  sildenafil (REVATIO) 20 MG tablet Take 40-100 mg by mouth as needed (for ED).     [provider]  simvastatin (ZOCOR) 20 MG tablet Take 10 mg by mouth at bedtime.    [provider]  testosterone cypionate (DEPOTESTOSTERONE CYPIONATE) 200 MG/ML injection Inject 200 mg into the muscle every 14 (fourteen) days.    [provider]  verapamil (CALAN-SR) 240 MG CR tablet Take 240 mg by mouth 2 (two) times daily.    [provider]    Allergies Patient has no known allergies.  Family History  Problem Relation Age of Onset  . Prostate cancer Neg Hx   . Bladder Cancer Neg Hx   . Kidney cancer  Neg Hx     Social History Social History   Tobacco Use  . Smoking status: Current Every Day Smoker    Packs/day: 1.00    Types: Cigarettes  . Smokeless tobacco: Never Used  Substance Use Topics  . Alcohol use: Yes    Comment: occ  . Drug use: No    Review of Systems  Constitutional: No fever/chills Eyes: No visual changes. ENT: No sore throat. Cardiovascular: Denies chest pain. Respiratory: Denies shortness of breath. Gastrointestinal: No abdominal pain.  No  nausea, no vomiting.  No diarrhea.  No constipation. Genitourinary: Positive for sensation of urinary retention and dysuria. Musculoskeletal: Negative for back pain. Skin: Negative for rash. Neurological: Negative for headaches, focal weakness or numbness.   ____________________________________________   PHYSICAL EXAM:  VITAL SIGNS: ED Triage Vitals [11/04/20 0145]  Enc Vitals Group     BP 133/70     Pulse Rate (!) 125     Resp 20     Temp 97.8 F (36.6 C)     Temp Source Oral     SpO2 96 %     Weight      Height      Head Circumference      Peak Flow      Pain Score      Pain Loc      Pain Edu?      Excl. in [place]?     Constitutional: Alert and oriented. Well appearing and in no acute distress. Eyes: Conjunctivae are normal. PERRL. EOMI. Head: Atraumatic. Nose: No congestion/rhinnorhea. Mouth/Throat: Mucous membranes are moist.  Oropharynx non-erythematous. Neck: No stridor.   Cardiovascular: Normal rate, regular rhythm. Grossly normal heart sounds.  Good peripheral circulation. Respiratory: Normal respiratory effort.  No retractions. Lungs CTAB. Gastrointestinal: Soft and nontender. No distention. No abdominal bruits. No CVA tenderness. Genitourinary: Circumcised male.  Bilaterally descended testicles which are nonswollen nontender.  Strong bilateral cremasteric reflexes. Patient currently trying to urinate into a urinal, hollers with waves of spasms. Musculoskeletal: No lower extremity tenderness nor edema.  No joint effusions. Neurologic:  Normal speech and language. No gross focal neurologic deficits are appreciated. No gait instability. Skin:  Skin is warm, dry and intact. No rash noted. Psychiatric: Mood and affect are normal. Speech and behavior are normal.  ____________________________________________   LABS (all labs ordered are listed, but only abnormal results are displayed)  Labs Reviewed  CBC WITH DIFFERENTIAL/PLATELET - Abnormal; Notable for the  following components:      Result Value   WBC 10.7 (*)    Neutro Abs 8.3 (*)    All other components within normal limits  BASIC METABOLIC PANEL - Abnormal; Notable for the following components:   Potassium 7.2 (*)    Chloride 113 (*)    CO2 17 (*)    Glucose, Bld 102 (*)    BUN 92 (*)    Creatinine, Ser 2.80 (*)    GFR, Estimated 21 (*)    All other components within normal limits  POTASSIUM - Abnormal; Notable for the following components:   Potassium 7.3 (*)    All other components within normal limits  URINALYSIS, COMPLETE (UACMP) WITH MICROSCOPIC - Abnormal; Notable for the following components:   Color, Urine YELLOW (*)    APPearance HAZY (*)    Hgb urine dipstick LARGE (*)    Protein, ur 30 (*)    RBC / HPF >50 (*)    All other components within normal limits  SARS CORONAVIRUS 2  BY RT PCR (HOSPITAL ORDER, [place] [facility])  URINE CULTURE  HEMOGLOBIN [id number]  BASIC METABOLIC PANEL  BASIC METABOLIC PANEL  BASIC METABOLIC PANEL  BASIC METABOLIC PANEL  CBG MONITORING, ED  TROPONIN I (HIGH SENSITIVITY)   ____________________________________________  EKG  ED ECG REPORT I, Kynzee Devinney J, the attending physician, personally viewed and interpreted this ECG.   Date: 11/04/2020  EKG Time: 0311  Rate: 131  Rhythm: sinus tachycardia  Axis: LAD  Intervals:right bundle branch block  ST&T Change: Nonspecific RBBB present 06/25/2019  ____________________________________________  RADIOLOGY Cecilie Kicks J, personally viewed and evaluated these images (plain radiographs) as part of my medical decision making, as well as reviewing the written report by the radiologist.  ED MD interpretation: Marked enlarged prostate, cholelithiasis without cholecystitis; no acute cardiopulmonary process  Official radiology report(s): DG Chest Port 1 View  Result Date: 11/04/2020 CLINICAL DATA:  Hyperkalemia EXAM: PORTABLE CHEST 1 VIEW COMPARISON:  06/25/2019 FINDINGS:  Interstitial prominence is again noted, unchanged. No superimposed focal pulmonary infiltrate. Pulmonary insufflation is normal and symmetric. No pneumothorax or pleural effusion. Cardiac size within normal limits. No acute bone abnormality. IMPRESSION: No active disease. Electronically Signed   By: Fidela Salisbury MD   On: 11/04/2020 04:26   CT Renal Stone Study  Result Date: 11/04/2020 CLINICAL DATA:  Urinary retention, dysuria, perineal pain EXAM: CT ABDOMEN AND PELVIS WITHOUT CONTRAST TECHNIQUE: Multidetector CT imaging of the abdomen and pelvis was performed following the standard protocol without IV contrast. COMPARISON:  06/30/2011 FINDINGS: Imaging is slightly limited by motion artifact. Lower chest: Severe emphysema with hyperinflation of the lung bases. Extensive coronary artery calcification. Global cardiac size within normal limits. Small hiatal hernia. Hepatobiliary: Cholelithiasis without pericholecystic inflammatory change. Liver unremarkable. No intra or extrahepatic biliary ductal dilation. Pancreas: Unremarkable Spleen: Unremarkable Adrenals/Urinary Tract: Adrenal glands are unremarkable. 2 simple cysts are seen within the interpolar region of the left kidney slightly enlarged since prior examination measuring up to 2.6 cm in greatest dimension. The kidneys are otherwise unremarkable. The bladder is mildly distended. Marked prostatic hypertrophy indents the base of the bladder. Stomach/Bowel: Sigmoid colectomy has been performed. There is moderate to severe diverticulosis of the residual distal colon. The stomach, small bowel, and large bowel are otherwise unremarkable. No evidence of obstruction or focal inflammation. Appendix normal. No free intraperitoneal gas or fluid. Vascular/Lymphatic: Extensive aortoiliac atherosclerotic calcification. No aneurysm. No pathologic adenopathy within the abdomen and pelvis. Reproductive: There is marked prostatic hypertrophy noted, particularly centrally.  The prostate gland has an estimated prostatic volume of approximately 180 cc. The seminal vesicles are unremarkable. Left orchiectomy has been performed. Other: The rectum is unremarkable.  No body wall hernia identified. Musculoskeletal: No acute bone abnormality within the abdomen and pelvis. Degenerative changes are seen throughout the lumbar spine. IMPRESSION: Marked prostatic hypertrophy with an estimated prostatic volume of approximately 180 cc. Mild bladder distension. Severe emphysema. Extensive coronary artery calcification. Severe distal colonic diverticulosis. No superimposed inflammatory change. Aortic Atherosclerosis (ICD10-I70.0). Electronically Signed   By: Fidela Salisbury MD   On: 11/04/2020 04:00    ____________________________________________   PROCEDURES  Procedure(s) performed (including Critical Care):  .1-3 Lead EKG Interpretation Performed by: Paulette Blanch, MD Authorized by: Paulette Blanch, MD     Interpretation: abnormal     ECG rate:  130   ECG rate assessment: tachycardic     Rhythm: sinus tachycardia     Ectopy: none     Conduction: normal   Comments:  Patient placed on cardiac monitor to evaluate for arrhythmias    CRITICAL CARE Performed by: Paulette Blanch   Total critical care time: 45 minutes  Critical care time was exclusive of separately billable procedures and treating other patients.  Critical care was necessary to treat or prevent imminent or life-threatening deterioration.  Critical care was time spent personally by me on the following activities: development of treatment plan with patient and/or surrogate as well as nursing, discussions with consultants, evaluation of patient's response to treatment, examination of patient, obtaining history from patient or surrogate, ordering and performing treatments and interventions, ordering and review of laboratory studies, ordering and review of radiographic studies, pulse oximetry and re-evaluation of  patient's condition.  ____________________________________________   INITIAL IMPRESSION / ASSESSMENT AND PLAN / ED COURSE  As part of my medical decision making, I reviewed the following data within the [place] notes reviewed and incorporated, Labs reviewed, EKG interpreted, Old chart reviewed, Radiograph reviewed, Discussed with admitting physician and Notes from prior ED visits     87 year old male presenting with urinary retention and dysuria. Differential diagnosis includes, but is not limited to, acute appendicitis, renal colic, testicular torsion, urinary tract infection/pyelonephritis, prostatitis,  epididymitis, diverticulitis, small bowel obstruction or ileus, colitis, abdominal aortic aneurysm, gastroenteritis, hernia, etc.  Patient brought back for potassium 7.2.  Labs also notable for acute renal failure.  Will obtain EKG, repeat potassium, initiate IV fluids, obtain CT renal colic study to evaluate for obstructive uropathy.  Clinical Course as of 11/04/20 0543  Tue Nov 04, 2020  0318 Peaked T waves noted on EKG.  Will initiate hyperkalemia cocktail pending repeat potassium.  No significant relief from low-dose IV Ativan.  Will try IV Morphine. [JS]  0923 Repeat potassium 7.3 [JS]  0415 Pain significantly improved after Foley placement.  Updated patient on laboratory and CT imaging results.  Discussed with hospitalist services for admission. [JS]    Clinical Course User Index [JS] Paulette Blanch, MD     ____________________________________________   FINAL CLINICAL IMPRESSION(S) / ED DIAGNOSES  Final diagnoses:  Enlarged prostate  Hyperkalemia  Acute renal failure, unspecified acute renal failure type [facility])  Obstructive uropathy     ED Discharge Orders    None      *Please note:  George [name] was evaluated in Emergency Department on 11/04/2020 for the symptoms described in the history of present illness. He was evaluated in the context  of the global COVID-19 pandemic, which necessitated consideration that the patient might be at risk for infection with the SARS-CoV-2 virus that causes COVID-19. Institutional protocols and algorithms that pertain to the evaluation of patients at risk for COVID-19 are in a state of rapid change based on information released by regulatory bodies including the CDC and federal and state organizations. These policies and algorithms were followed during the patient's care in the ED.  Some ED evaluations and interventions may be delayed as a result of limited staffing during and the pandemic.*   Note:  This document was prepared using Dragon voice recognition software and may include unintentional dictation errors.   Paulette Blanch, MD 11/04/20 [phone]

## 2020-11-04 NOTE — Progress Notes (Signed)
Anticoagulation monitoring(Lovenox):  88yo  M ordered Lovenox 40 mg Q24h  Filed Weights   11/04/20 0709  Weight: 80.7 kg (178 lb)   BMI 25   Lab Results  Component Value Date   CREATININE 2.80 (H) 11/04/2020   CREATININE 1.37 (H) 06/28/2019   CREATININE 1.67 (H) 06/26/2019   Estimated Creatinine Clearance: 19.6 mL/min (A) (by C-G formula based on SCr of 2.8 mg/dL (H)). Hemoglobin & Hematocrit     Component Value Date/Time   HGB 14.2 11/04/2020 0204   HCT 42.4 11/04/2020 0204     Per Protocol for Patient with estCrcl < 30 ml/min and BMI < 40, will transition to Lovenox 30 mg Q24h      Chinita Greenland PharmD Clinical Pharmacist 11/04/2020

## 2020-11-04 NOTE — ED Notes (Signed)
Pt cont to scream & yell, cursing at this nurse; st "what [facility] of hospital is this???!!"

## 2020-11-04 NOTE — ED Notes (Addendum)
Awaiting medication from pharmacy.

## 2020-11-04 NOTE — ED Notes (Signed)
Warm blankets given to patient.

## 2020-11-04 NOTE — ED Notes (Signed)
Diaper change, incontinence care complete, complete linen change complete.

## 2020-11-04 NOTE — ED Notes (Signed)
Patient talking on phone at this time. 

## 2020-11-04 NOTE — ED Notes (Signed)
Bladder scan indicates approx 170ml; pt very anxious, intermittently yelling out "I'm in agony!" c/o intermittent "burning" pain to end of penis and suprapubic area

## 2020-11-04 NOTE — ED Triage Notes (Signed)
Pt to ED via EMS from home with c/o urinary retention x3 hours. Pt reports he has had urinary issues for "weeks," but tonight was the first retention. Pt hard of hearing.

## 2020-11-04 NOTE — Progress Notes (Addendum)
PROGRESS NOTE    George [name]  [id number] DOB: 1932-01-31 DOA: 11/04/2020 PCP: Juluis Pitch, MD    Assessment & Plan:   Active Problems:   Essential hypertension   CAD (coronary artery disease)   COPD (chronic obstructive pulmonary disease) ([place])   Diabetes mellitus type 2, uncomplicated (HCC)   Acute kidney injury superimposed on CKD IIIb ([place])   BPH with obstruction/lower urinary tract symptoms   Hyperkalemia   Acute urinary retention   George [name] is a 87 y.o. male with medical history significant for CAD with history of PCI to LAD, COPD, DM, HTN, BPH, CKD 3b who presents to the emergency room with complaints of increasing difficulty passing urine over the past several weeks and on the night of arrival went he was having urinary frequency but passing only dribbles of urine with painful urination.  After this went on for several hours he decided to come into the emergency room to be evaluated.      Hyperkalemia --K 7.2 on presentation.   --s/p temporizing, veltassa x1 and Kayexalate x1, with mutliple BM's already.   --K trending down to 5.6  --change bed status to Med-Surg    Acute kidney injury superimposed on CKD IIIb (HCC) Metabolic acidosis --Cr 2.8 on presentation, trending down with IVF Plan: --cont MIVF as bicarb infusion@100     Acute urinary retention secondary to BPH with obstruction/LUTS -Pt reported difficulty voiding.  Renal stone study showing marked prostatic hypertrophy with prostatic volume of 180 cc with mild bladder distention --Foley inserted on presentation. Plan: --cont Foley -Continue Cardura 4 mg at bedtime --add finasteride and flomax  Dysuria --Pt reported pain with trying to void, however, currently pain free with Foley.  UA neg nit/neg leuk.  Abx was not started. Plan: --Hold abx for now and follow urine cx    Essential hypertension -BP varied, intermittently soft --hold home BP meds for now    CAD with history of PCI to  LAD (coronary artery disease) -Continue simvastatin and aspirin     COPD (chronic obstructive pulmonary disease) (HCC) -DuoNebs as needed  Hx of  Diabetes mellitus type 2, uncomplicated ([place]), not currently active --A1c 5.6 --d/c fingersticks   DVT prophylaxis: Lovenox SQ Code Status: Full code  Family Communication:  Status is: inpatient Dispo:   The patient is from: home Anticipated d/c is to: home Anticipated d/c date is: 1-2 days Patient currently is not medically ready to d/c due to: AKI on IVF, hyperkalemia.   Subjective and Interval History:  Pt reported severe pain when he was trying to void before, but currently pain free and Foley draining clear urine.  Has had multiple BM's after Kayexalate, per nursing report.  No N/V.     Objective: Vitals:   11/04/20 2300 11/04/20 2315 11/04/20 2330 11/04/20 2345  BP: (!) 161/100     Pulse: (!) 106 (!) 106 97 98  Resp: 18 13 15 16   Temp:      TempSrc:      SpO2: 98% 97% 95% 93%  Weight:      Height:        Intake/Output Summary (Last 24 hours) at 11/05/2020 0144 Last data filed at 11/04/2020 1903 Gross per 24 hour  Intake 795.66 ml  Output 2300 ml  Net -1504.34 ml   Filed Weights   11/04/20 0709  Weight: 80.7 kg    Examination:   Constitutional: NAD, AAOx3 HEENT: conjunctivae and lids normal, EOMI, very hard of hearing CV: No cyanosis.  RESP: normal respiratory effort, on RA GI: no tenderness to palpation Extremities: No effusions, edema in BLE SKIN: warm, dry Neuro: II - XII grossly intact.   Psych: Normal mood and affect.   Foley present   Data Reviewed: I have personally reviewed following labs and imaging studies  CBC: Recent Labs  Lab 11/04/20 0204  WBC 10.7*  NEUTROABS 8.3*  HGB 14.2  HCT 42.4  MCV 91.0  PLT 932   Basic Metabolic Panel: Recent Labs  Lab 11/04/20 0204 11/04/20 0315 11/04/20 0726 11/04/20 1130 11/04/20 1530  NA 140  --  145 143 142  K 7.2* 7.3* 6.7* 6.4* 5.6*   CL 113*  --  115* 114* 111  CO2 17*  --  17* 18* 21*  GLUCOSE 102*  --  86 98 101*  BUN 92*  --  83* 77* 70*  CREATININE 2.80*  --  2.43* 2.30* 2.19*  CALCIUM 9.4  --  9.3 9.2 8.9   GFR: Estimated Creatinine Clearance: 25 mL/min (A) (by C-G formula based on SCr of 2.19 mg/dL (H)). Liver Function Tests: No results for input(s): AST, ALT, ALKPHOS, BILITOT, PROT, ALBUMIN in the last 168 hours. No results for input(s): LIPASE, AMYLASE in the last 168 hours. No results for input(s): AMMONIA in the last 168 hours. Coagulation Profile: No results for input(s): INR, PROTIME in the last 168 hours. Cardiac Enzymes: No results for input(s): CKTOTAL, CKMB, CKMBINDEX, TROPONINI in the last 168 hours. BNP (last 3 results) No results for input(s): PROBNP in the last 8760 hours. HbA1C: Recent Labs    11/04/20 0540  HGBA1C 5.6   CBG: Recent Labs  Lab 11/04/20 0426 11/04/20 0719 11/04/20 1232 11/04/20 1701  GLUCAP 92 83 196* 83   Lipid Profile: No results for input(s): CHOL, HDL, LDLCALC, TRIG, CHOLHDL, LDLDIRECT in the last 72 hours. Thyroid Function Tests: No results for input(s): TSH, T4TOTAL, FREET4, T3FREE, THYROIDAB in the last 72 hours. Anemia Panel: No results for input(s): VITAMINB12, FOLATE, FERRITIN, TIBC, IRON, RETICCTPCT in the last 72 hours. Sepsis Labs: No results for input(s): PROCALCITON, LATICACIDVEN in the last 168 hours.  Recent Results (from the past 240 hour(s))  SARS Coronavirus 2 by RT PCR (hospital order, performed in [facility] hospital lab) Nasopharyngeal Nasopharyngeal Swab     Status: None   Collection Time: 11/04/20  4:02 AM   Specimen: Nasopharyngeal Swab  Result Value Ref Range Status   SARS Coronavirus 2 NEGATIVE NEGATIVE Final    Comment: (NOTE) SARS-CoV-2 target nucleic acids are NOT DETECTED.  The SARS-CoV-2 RNA is generally detectable in upper and lower respiratory specimens during the acute phase of infection. The lowest concentration of  SARS-CoV-2 viral copies this assay can detect is 250 copies / mL. A negative result does not preclude SARS-CoV-2 infection and should not be used as the sole basis for treatment or other patient management decisions.  A negative result may occur with improper specimen collection / handling, submission of specimen other than nasopharyngeal swab, presence of viral mutation(s) within the areas targeted by this assay, and inadequate number of viral copies (<250 copies / mL). A negative result must be combined with clinical observations, patient history, and epidemiological information.  Fact Sheet for Patients:   StrictlyIdeas.no  Fact Sheet for Healthcare Providers: BankingDealers.co.za  This test is not yet approved or  cleared by the [place] FDA and has been authorized for detection and/or diagnosis of SARS-CoV-2 by FDA under an Emergency Use Authorization (EUA).  This EUA  will remain in effect (meaning this test can be used) for the duration of the COVID-19 declaration under Section 564(b)(1) of the Act, 21 U.S.C. section 360bbb-3(b)(1), unless the authorization is terminated or revoked sooner.  Performed at [facility], [address] [place], [place] [address]       Radiology Studies: DG Chest [place] 1 View  Result Date: 11/04/2020 CLINICAL DATA:  Hyperkalemia EXAM: PORTABLE CHEST 1 VIEW COMPARISON:  06/25/2019 FINDINGS: Interstitial prominence is again noted, unchanged. No superimposed focal pulmonary infiltrate. Pulmonary insufflation is normal and symmetric. No pneumothorax or pleural effusion. Cardiac size within normal limits. No acute bone abnormality. IMPRESSION: No active disease. Electronically Signed   By: Fidela Salisbury MD   On: 11/04/2020 04:26   CT Renal Stone Study  Result Date: 11/04/2020 CLINICAL DATA:  Urinary retention, dysuria, perineal pain EXAM: CT ABDOMEN AND PELVIS WITHOUT CONTRAST TECHNIQUE:  Multidetector CT imaging of the abdomen and pelvis was performed following the standard protocol without IV contrast. COMPARISON:  06/30/2011 FINDINGS: Imaging is slightly limited by motion artifact. Lower chest: Severe emphysema with hyperinflation of the lung bases. Extensive coronary artery calcification. Global cardiac size within normal limits. Small hiatal hernia. Hepatobiliary: Cholelithiasis without pericholecystic inflammatory change. Liver unremarkable. No intra or extrahepatic biliary ductal dilation. Pancreas: Unremarkable Spleen: Unremarkable Adrenals/Urinary Tract: Adrenal glands are unremarkable. 2 simple cysts are seen within the interpolar region of the left kidney slightly enlarged since prior examination measuring up to 2.6 cm in greatest dimension. The kidneys are otherwise unremarkable. The bladder is mildly distended. Marked prostatic hypertrophy indents the base of the bladder. Stomach/Bowel: Sigmoid colectomy has been performed. There is moderate to severe diverticulosis of the residual distal colon. The stomach, small bowel, and large bowel are otherwise unremarkable. No evidence of obstruction or focal inflammation. Appendix normal. No free intraperitoneal gas or fluid. Vascular/Lymphatic: Extensive aortoiliac atherosclerotic calcification. No aneurysm. No pathologic adenopathy within the abdomen and pelvis. Reproductive: There is marked prostatic hypertrophy noted, particularly centrally. The prostate gland has an estimated prostatic volume of approximately 180 cc. The seminal vesicles are unremarkable. Left orchiectomy has been performed. Other: The rectum is unremarkable.  No body wall hernia identified. Musculoskeletal: No acute bone abnormality within the abdomen and pelvis. Degenerative changes are seen throughout the lumbar spine. IMPRESSION: Marked prostatic hypertrophy with an estimated prostatic volume of approximately 180 cc. Mild bladder distension. Severe emphysema. Extensive  coronary artery calcification. Severe distal colonic diverticulosis. No superimposed inflammatory change. Aortic Atherosclerosis (ICD10-I70.0). Electronically Signed   By: Fidela Salisbury MD   On: 11/04/2020 04:00     Scheduled Meds: . aspirin  81 mg Oral Daily  . colchicine  0.3 mg Oral Daily  . doxazosin  4 mg Oral QHS  . enoxaparin (LOVENOX) injection  30 mg Subcutaneous Q24H  . pantoprazole  40 mg Oral Daily  . simvastatin  10 mg Oral QHS   Continuous Infusions: . sodium bicarbonate (isotonic) 150 mEq in D5W 1000 mL infusion 100 mL/hr at 11/04/20 2317     LOS: 1 day   No charge note.   Enzo Bi, MD [facility] If 7PM-7AM, please contact night-coverage 11/05/2020, 1:44 AM

## 2020-11-04 NOTE — ED Notes (Signed)
Patient had BM. Patient cleaned, sheets/brief/chuck pad changed, and patient is repositioned.

## 2020-11-04 NOTE — ED Notes (Signed)
Patient had bowel movement. Brief/chuck pad changed and patient cleaned. Patient given cup of water.

## 2020-11-04 NOTE — ED Notes (Signed)
Charge nurse notified of K+, acuity level changed

## 2020-11-04 NOTE — ED Notes (Signed)
Date and time results received: 11/04/20 3:47 AM  Test: Potassium  Critical Value: 7.3  Name of Provider Notified: Beather Arbour MD

## 2020-11-05 ENCOUNTER — Encounter: Payer: Self-pay | Admitting: Internal Medicine

## 2020-11-05 DIAGNOSIS — E875 Hyperkalemia: Principal | ICD-10-CM

## 2020-11-05 DIAGNOSIS — N400 Enlarged prostate without lower urinary tract symptoms: Secondary | ICD-10-CM

## 2020-11-05 DIAGNOSIS — N139 Obstructive and reflux uropathy, unspecified: Secondary | ICD-10-CM

## 2020-11-05 LAB — URINE CULTURE: Culture: <10000 — AB

## 2020-11-05 LAB — BASIC METABOLIC PANEL
Anion gap: 9 (ref 5–15)
BUN: 57 mg/dL — ABNORMAL HIGH (ref 8–23)
CO2: 25 mmol/L (ref 22–32)
Calcium: 8.7 mg/dL — ABNORMAL LOW (ref 8.9–10.3)
Chloride: 110 mmol/L (ref 98–111)
Creatinine, Ser: 2 mg/dL — ABNORMAL HIGH (ref 0.61–1.24)
GFR, Estimated: 32 mL/min — ABNORMAL LOW (ref >60)
Glucose, Bld: 111 mg/dL — ABNORMAL HIGH (ref 70–99)
Potassium: 5.2 mmol/L — ABNORMAL HIGH (ref 3.5–5.1)
Sodium: 144 mmol/L (ref 135–145)

## 2020-11-05 LAB — CBC
HCT: 37.5 % — ABNORMAL LOW (ref 39.0–52.0)
Hemoglobin: 12.8 g/dL — ABNORMAL LOW (ref 13.0–17.0)
MCH: 30.7 pg (ref 26.0–34.0)
MCHC: 34.1 g/dL (ref 30.0–36.0)
MCV: 89.9 fL (ref 80.0–100.0)
Platelets: 220 10*3/uL (ref 150–400)
RBC: 4.17 MIL/uL — ABNORMAL LOW (ref 4.22–5.81)
RDW: 13.5 % (ref 11.5–15.5)
WBC: 9.3 10*3/uL (ref 4.0–10.5)
nRBC: 0 % (ref 0.0–0.2)

## 2020-11-05 LAB — MAGNESIUM: Magnesium: 1.6 mg/dL — ABNORMAL LOW (ref 1.7–2.4)

## 2020-11-05 MED ORDER — VERAPAMIL HCL ER 240 MG PO TBCR
240.0000 mg | EXTENDED_RELEASE_TABLET | Freq: Two times a day (BID) | ORAL | Status: DC
Start: 2020-11-05 — End: 2020-11-09
  Administered 2020-11-05 – 2020-11-09 (×8): 240 mg via ORAL
  Filled 2020-11-05 (×9): qty 1

## 2020-11-05 MED ORDER — PROMETHAZINE HCL 25 MG/ML IJ SOLN
12.5000 mg | Freq: Four times a day (QID) | INTRAMUSCULAR | Status: DC | PRN
  Administered 2020-11-05: 12.5 mg via INTRAVENOUS
  Filled 2020-11-05: qty 1

## 2020-11-05 MED ORDER — MOMETASONE FURO-FORMOTEROL FUM 200-5 MCG/ACT IN AERO
2.0000 | INHALATION_SPRAY | Freq: Two times a day (BID) | RESPIRATORY_TRACT | Status: DC
  Administered 2020-11-05 – 2020-11-09 (×8): 2 via RESPIRATORY_TRACT
  Filled 2020-11-05: qty 8.8

## 2020-11-05 MED ORDER — FINASTERIDE 5 MG PO TABS
5.0000 mg | ORAL_TABLET | Freq: Every day | ORAL | Status: DC
  Administered 2020-11-05 – 2020-11-09 (×5): 5 mg via ORAL
  Filled 2020-11-05 (×6): qty 1

## 2020-11-05 MED ORDER — TAMSULOSIN HCL 0.4 MG PO CAPS: 0.4000 mg | ORAL_CAPSULE | Freq: Every day | ORAL | Status: DC

## 2020-11-05 MED ORDER — CHLORHEXIDINE GLUCONATE CLOTH 2 % EX PADS
6.0000 | MEDICATED_PAD | Freq: Every day | CUTANEOUS | Status: DC
  Administered 2020-11-05 – 2020-11-08 (×4): 6 via TOPICAL

## 2020-11-05 MED ORDER — MAGNESIUM SULFATE 2 GM/50ML IV SOLN
2.0000 g | Freq: Once | INTRAVENOUS | Status: AC
  Administered 2020-11-05: 2 g via INTRAVENOUS
  Filled 2020-11-05: qty 50

## 2020-11-05 MED ORDER — TRAMADOL HCL 50 MG PO TABS
50.0000 mg | ORAL_TABLET | Freq: Four times a day (QID) | ORAL | Status: DC | PRN
  Administered 2020-11-05 – 2020-11-08 (×2): 50 mg via ORAL
  Filled 2020-11-05 (×2): qty 1

## 2020-11-05 NOTE — Progress Notes (Signed)
George [name]  [id number] DOB: 04/04/1932 DOA: 11/04/2020 PCP: Juluis Pitch, MD    Brief Narrative:  87 year old with a history of CAD status post PCI to the LAD, COPD, DM, HTN, BPH, and CKD stage IIIb who presented to the ED with difficulty passing his urine for several weeks, progressing to the point that he was only able to void a few dribbles. In the ED a Foley catheter was placed. The patient was found to be suffering with acute kidney injury, acute urinary retention, and severe hyperkalemia.  Significant Events:  1/25 admit via ED  Antimicrobials:  None  DVT prophylaxis: Lovenox  Subjective: Having significant nausea this afternoon.  Denies shortness of breath or vomiting.  No abdominal pain.  Is awake and conversant at time of my exam.  Assessment & Plan:  Acute urinary retention -severe BPH with obstruction Foley catheter placed - CT renal stone study noted marked prostatic hypertrophy -Cardura to continue -added finasteride and Flomax - foley will need to remain in place w/ outpt Urology f/u   Acute kidney injury on CKD stage IIIb due to above -follow trend with Foley to remain in place  Recent Labs  Lab 11/04/20 0204 11/04/20 0726 11/04/20 1130 11/04/20 1530 11/05/20 0326  CREATININE 2.80* 2.43* 2.30* 2.19* 2.00*     Hyperkalemia due to acute kidney failure -improving with treatment  Recent Labs  Lab 11/04/20 0315 11/04/20 0726 11/04/20 1130 11/04/20 1530 11/05/20 0326  K 7.3* 6.7* 6.4* 5.6* 5.2*    HTN BP trending upward - adjust tx and follow   Hypomagnesemia  Replace and f/u in AM   CAD status post PCI to LAD continue usual home medical therapies  COPD No acute exacerbation presently   DM 2 CBG controlled   Code Status: FULL CODE Family Communication:  Status is: Inpatient  Remains inpatient appropriate because:Inpatient level of care appropriate due to severity of illness   Dispo: The patient is from: Home               Anticipated d/c is to: unclear               Anticipated d/c date is: 2 days              Patient currently is not medically stable to d/c.   Difficult to place patient No   Consultants:  none  Objective: Blood pressure (!) 165/86, pulse 99, temperature 97.9 F (36.6 C), resp. rate 18, height 5\' 10"  (1.778 m), weight 80.7 kg, SpO2 98 %.  Intake/Output Summary (Last 24 hours) at 11/05/2020 1005 Last data filed at 11/04/2020 1903 Gross per 24 hour  Intake 795.66 ml  Output 1800 ml  Net -1004.34 ml   Filed Weights   11/04/20 0709  Weight: 80.7 kg    Examination: General: No acute respiratory distress Lungs: Clear to auscultation bilaterally without wheezes or crackles Cardiovascular: Regular rate and rhythm without murmur gallop or rub normal S1 and S2 Abdomen: Nontender, nondistended, soft, bowel sounds positive, no rebound, no ascites, no appreciable mass Extremities: No significant cyanosis, clubbing, or edema bilateral lower extremities  CBC: Recent Labs  Lab 11/04/20 0204 11/05/20 0326  WBC 10.7* 9.3  NEUTROABS 8.3*  --   HGB 14.2 12.8*  HCT 42.4 37.5*  MCV 91.0 89.9  PLT 254 [id number]   Basic Metabolic Panel: Recent Labs  Lab 11/04/20 1130 11/04/20 1530 11/05/20 0326  NA 143 142 144  K 6.4* 5.6* 5.2*  CL 114* 111 110  CO2 18* 21* 25  GLUCOSE 98 101* 111*  BUN 77* 70* 57*  CREATININE 2.30* 2.19* 2.00*  CALCIUM 9.2 8.9 8.7*  MG  --   --  1.6*   GFR: Estimated Creatinine Clearance: 27.4 mL/min (A) (by C-G formula based on SCr of 2 mg/dL (H)).  Liver Function Tests: No results for input(s): AST, ALT, ALKPHOS, BILITOT, PROT, ALBUMIN in the last 168 hours. No results for input(s): LIPASE, AMYLASE in the last 168 hours. No results for input(s): AMMONIA in the last 168 hours.   HbA1C: Hgb A1c MFr Bld  Date/Time Value Ref Range Status  11/04/2020 05:40 AM 5.6 4.8 - 5.6 % Final    Comment:    (NOTE) Pre diabetes:          5.7%-6.4%  Diabetes:               >6.4%  Glycemic control for   <7.0% adults with diabetes   02/09/2016 01:21 AM 5.5 4.8 - 5.6 % Final    Comment:    (NOTE)         Pre-diabetes: 5.7 - 6.4         Diabetes: >6.4         Glycemic control for adults with diabetes: <7.0     CBG: Recent Labs  Lab 11/04/20 0426 11/04/20 0719 11/04/20 1232 11/04/20 1701  GLUCAP 92 83 196* 83    Recent Results (from the past 240 hour(s))  Urine culture     Status: Abnormal   Collection Time: 11/04/20  1:52 AM   Specimen: Urine, Random  Result Value Ref Range Status   Specimen Description   Final    URINE, RANDOM Performed at [facility], [address] [place], [place] [address]    Special Requests   Final    NONE Performed at [facility], [address] [place], [place] [address]    Culture (A)  Final    <10,000 COLONIES/mL INSIGNIFICANT GROWTH Performed at [facility], [place] [address] [place], [place] [address]    Report Status 11/05/2020 FINAL  Final  SARS Coronavirus 2 by RT PCR (hospital order, performed in [facility] hospital lab) Nasopharyngeal Nasopharyngeal Swab     Status: None   Collection Time: 11/04/20  4:02 AM   Specimen: Nasopharyngeal Swab  Result Value Ref Range Status   SARS Coronavirus 2 NEGATIVE NEGATIVE Final    Comment: (NOTE) SARS-CoV-2 target nucleic acids are NOT DETECTED.  The SARS-CoV-2 RNA is generally detectable in upper and lower respiratory specimens during the acute phase of infection. The lowest concentration of SARS-CoV-2 viral copies this assay can detect is 250 copies / mL. A negative result does not preclude SARS-CoV-2 infection and should not be used as the sole basis for treatment or other patient management decisions.  A negative result may occur with improper specimen collection / handling, submission of specimen other than nasopharyngeal swab, presence of viral mutation(s) within the areas targeted by this assay, and inadequate number of viral  copies (<250 copies / mL). A negative result must be combined with clinical observations, patient history, and epidemiological information.  Fact Sheet for Patients:   StrictlyIdeas.no  Fact Sheet for Healthcare Providers: BankingDealers.co.za  This test is not yet approved or  cleared by the [place] FDA and has been authorized for detection and/or diagnosis of SARS-CoV-2 by FDA under an Emergency Use Authorization (EUA).  This EUA will remain in effect (meaning this test can be  used) for the duration of the COVID-19 declaration under Section 564(b)(1) of the Act, 21 U.S.C. section 360bbb-3(b)(1), unless the authorization is terminated or revoked sooner.  Performed at [facility], [address] [place], [place] [address]      Scheduled Meds: . aspirin  81 mg Oral Daily  . colchicine  0.3 mg Oral Daily  . doxazosin  4 mg Oral QHS  . enoxaparin (LOVENOX) injection  30 mg Subcutaneous Q24H  . finasteride  5 mg Oral Daily  . pantoprazole  40 mg Oral Daily  . simvastatin  10 mg Oral QHS   Continuous Infusions: . sodium bicarbonate (isotonic) 150 mEq in D5W 1000 mL infusion 100 mL/hr at 11/05/20 0757     LOS: 1 day   Cherene Altes, MD [facility] Office  [phone] Pager - Text Page per Shea Evans  If 7PM-7AM, please contact night-coverage per Amion 11/05/2020, 10:05 AM

## 2020-11-05 NOTE — Plan of Care (Signed)
New care plan initiated 

## 2020-11-06 LAB — BASIC METABOLIC PANEL
Anion gap: 8 (ref 5–15)
BUN: 37 mg/dL — ABNORMAL HIGH (ref 8–23)
CO2: 31 mmol/L (ref 22–32)
Calcium: 8 mg/dL — ABNORMAL LOW (ref 8.9–10.3)
Chloride: 102 mmol/L (ref 98–111)
Creatinine, Ser: 2.08 mg/dL — ABNORMAL HIGH (ref 0.61–1.24)
GFR, Estimated: 30 mL/min — ABNORMAL LOW (ref >60)
Glucose, Bld: 98 mg/dL (ref 70–99)
Potassium: 4.4 mmol/L (ref 3.5–5.1)
Sodium: 141 mmol/L (ref 135–145)

## 2020-11-06 LAB — MAGNESIUM: Magnesium: 1.8 mg/dL (ref 1.7–2.4)

## 2020-11-06 MED ORDER — ENSURE ENLIVE PO LIQD
237.0000 mL | Freq: Two times a day (BID) | ORAL | Status: DC
  Administered 2020-11-06 – 2020-11-09 (×6): 237 mL via ORAL

## 2020-11-06 NOTE — Evaluation (Signed)
Occupational Therapy Evaluation Patient Details Name: George [name] MRN: [id number] DOB: Oct 19, 1931 Today's Date: 11/06/2020    History of Present Illness Pt is an 87yo M [facility] with c/o progressive urinary retention and dysuria. Pt dx with AKI, acute urinary retention, and severe hyperkalemia. Significant PMH includes: CAD s/p PCI, COPD, T2DM, HTN, HLD, Peyronie's disease, hypotestosteronemia, CKD (stage III), BPH. Imaging revealed prostatic hypertrophy with estimated prostatic volume of 180cc and mild bladder distention.   Clinical Impression   Pt seen for OT evaluation this date in setting of acute hospitalization d/t BPH with bladder distention. Pt reports being INDEP at baseline including driving and getting groceries. Pt lives alone in [facility] with 4 STE with b/l railing. This date, pt presents with some weakness and c/o pain when attempting to void. Pt requires MIN A/CGA for ADL Transfers with SPC. Pt performs fxl mobility to restroom with CGA/MIN A for steadying. Pt does c/o some dizziness that dissipates with seated rest and LB exercise to improve circulation such as ankle pumps. Pt requires SETUP to perform UB bathing in sitting and MOD A For posterior LB bathing in standing d/t decreased dynamic standing balance. Pt requires MIN A/CGA with SPC for standing sink-side to complete 2-3 g/h tasks with SETUP. Pt able to perform fxl mobility back to chair, but with limited control of descent with CGA/MIN A. Pt left with all needs met and in reach. OT provides safety education including call light use. Pt with good reception. Will continue to follow acutely. Anticipate pt will require HHOT upon d/c from acute setting.     Follow Up Recommendations  Home health OT;Supervision - Intermittent    Equipment Recommendations  3 in 1 bedside commode    Recommendations for Other Services       Precautions / Restrictions Precautions Precautions: Fall Restrictions Weight Bearing  Restrictions: No      Mobility Bed Mobility Overal bed mobility: Needs Assistance Bed Mobility: Supine to Sit     Supine to sit: Min guard;HOB elevated Sit to supine: Supervision   General bed mobility comments: slight assist for trunk to come to EOB sitting, pt primarily able to utilize grab bar to facilitate    Transfers Overall transfer level: Needs assistance Equipment used: Straight cane Transfers: Sit to/from Stand Sit to Stand: Min assist;Min guard         General transfer comment: cues for safety, pacing, control of descent with stand to sit.    Balance Overall balance assessment: Needs assistance Sitting-balance support: No upper extremity supported;Feet supported Sitting balance-Leahy Scale: Good     Standing balance support: Single extremity supported;During functional activity Standing balance-Leahy Scale: Fair Standing balance comment: Fair standing balance with SPC requiring CGA for safety                           ADL either performed or assessed with clinical judgement   ADL                                         General ADL Comments: Pt requires SETUP for seated UB ADLs, CGA/MIN A for standing balance while performing standing UB ADLs. Pt requires MIN/MOD A for seated LB ADLs, MOD A for posterior peri care in standing after toileting. OT engages pt in UB/LB bathing in sitting, pt requires SETUP for UB and  MIN/MOD For LB.     Vision Patient Visual Report: No change from baseline       Perception     Praxis      Pertinent Vitals/Pain Pain Assessment: Faces Faces Pain Scale: Hurts worst Pain Location: when urinating Pain Descriptors / Indicators: Burning Pain Intervention(s): Monitored during session     Hand Dominance     Extremity/Trunk Assessment Upper Extremity Assessment Upper Extremity Assessment: RUE deficits/detail;LUE deficits/detail RUE Deficits / Details: shld ROM 1/2 range, elbow and grip MMT  grossly 4-/5 LUE Deficits / Details: very limited shoulder mobility, ~1/8 range. H/o injury per pt. Elbow/grip MMT grossly 3+/5   Lower Extremity Assessment Lower Extremity Assessment: Defer to PT evaluation;Overall [facility] for tasks assessed;Generalized weakness (ROM grossly WFL/appropriate for age, but pt notably, somewhat unstable in standing. states he hasn't been OOB since being hospitalized.)   Cervical / Trunk Assessment Cervical / Trunk Assessment: Kyphotic   Communication Communication Communication: HOH   Cognition Arousal/Alertness: Awake/alert Behavior During Therapy: WFL for tasks assessed/performed Overall Cognitive Status: Within Functional Limits for tasks assessed                                 General Comments: Pt A&O x4 and was able to follow 100% of two-step commands.   General Comments  dyuria and hematuria noted in catheter as well as pt with c/o significant pain when voiding.    Exercises Other Exercises Other Exercises: OT engages pt in education re: role of OT, safety considerations including call light use and use of chair alarm. Pt with good understanding. Other Exercises: Pt educated regarding: PT role/POC, DC recommendations, safety with mobility.   Shoulder Instructions      Home Living Family/patient expects to be discharged to:: Private residence Living Arrangements: Alone Available Help at Discharge: Friend(s);Neighbor;Available PRN/intermittently Type of Home: House Home Access: Stairs to enter [employer] of Steps: 4 Entrance Stairs-Rails: Can reach both Home Layout: Two level;Able to live on main level with bedroom/bathroom               Home Equipment: Kasandra Knudsen - single point;Shower seat          Prior Functioning/Environment Level of Independence: Independent with assistive device(s)        Comments: Pt reports being grossly mod I with use of SPC intermittently for ADL's, cooking, and limited comunity  ambulation. Pt reports increased difficulty with managing ADL's. Pt has housekeepers that assist with cleaning.        OT Problem List: Decreased strength;Decreased activity tolerance;Pain      OT Treatment/Interventions: Self-care/ADL training;DME and/or AE instruction;Therapeutic activities;Balance training;Therapeutic exercise;Energy conservation;Patient/family education    OT Goals(Current goals can be found in the care plan section) Acute Rehab OT Goals Patient Stated Goal: to get better OT Goal Formulation: With patient Time For Goal Achievement: 11/20/20 Potential to Achieve Goals: Good ADL Goals Pt Will Perform Upper Body Dressing: Independently;sitting Pt Will Perform Lower Body Dressing: with supervision;sit to/from stand (with LRAD) Pt Will Transfer to Toilet: with supervision;ambulating;grab bars (with LRAD to restroom with BSC over standard commode to elevate height) Pt Will Perform Toileting - Clothing Manipulation and hygiene: with supervision;with min guard assist;sit to/from stand Pt/caregiver will Perform Home Exercise Program: Increased strength;Both right and left upper extremity;With Supervision (minful of L UE shoulder issues.)  OT Frequency: Min 1X/week   Barriers to D/C:  Co-evaluation              AM-PAC OT "6 Clicks" Daily Activity     Outcome Measure Help from another person eating meals?: None Help from another person taking care of personal grooming?: None Help from another person toileting, which includes using toliet, bedpan, or urinal?: A Lot Help from another person bathing (including washing, rinsing, drying)?: A Lot Help from another person to put on and taking off regular upper body clothing?: A Little Help from another person to put on and taking off regular lower body clothing?: A Lot 6 Click Score: 17   End of Session Equipment Utilized During Treatment: Gait belt;Other (comment) [facility]) Nurse Communication: Mobility  status;Other (comment) (notified that IV beeping)  Activity Tolerance: Patient tolerated treatment well Patient left: in chair;with call bell/phone within reach;with chair alarm set  OT Visit Diagnosis: Unsteadiness on feet (R26.81);Muscle weakness (generalized) (M62.81)                Time: 4599-7741 OT Time Calculation (min): 53 min Charges:  OT General Charges $OT Visit: 1 Visit OT Evaluation $OT Eval Low Complexity: 1 Low OT Treatments $Self Care/Home Management : 23-37 mins $Therapeutic Activity: 8-22 mins  Gerrianne Scale, MS, OTR/L ascom [phone] 11/06/20, 3:13 PM

## 2020-11-06 NOTE — Progress Notes (Signed)
George [name]  [id number] DOB: June 04, 1932 DOA: 11/04/2020 PCP: Juluis Pitch, MD    Brief Narrative:  [phone] with a history of CAD status post PCI to the LAD, COPD, DM, HTN, BPH, and CKD stage IIIb who presented to the ED with difficulty passing his urine for several weeks, progressing to the point that he was only able to void a few dribbles. In the ED a Foley catheter was placed. The patient was found to be suffering with acute kidney injury, acute urinary retention, and severe hyperkalemia.  Significant Events:  1/25 admit via ED  Antimicrobials:  None  DVT prophylaxis: Lovenox  Subjective: Afebrile.  Vital signs stable.  Saturation 95% on room air.  Patient reports that he is feeling significantly better today.  No nausea/vomiting at this time.  Continues to have pain associated with attempt to urinate.  I discussed the Foley catheter and the fact that it will need to stay in for now and he tells me this is happened to him before and that he has been able to manage his Foley at home.  Assessment & Plan:  Acute urinary retention - severe BPH with obstruction Foley catheter placed - CT renal stone study noted marked prostatic hypertrophy -Cardura to continue -added finasteride and Flomax - foley will need to remain in place w/ outpt Urology f/u   Acute kidney injury on CKD stage IIIb due to above -follow trend with Foley to remain in place -creatinine appears stable at this time -baseline creatinine range 1.4-2.0 as per last year -recheck in a.m.  Recent Labs  Lab 11/04/20 0726 11/04/20 1130 11/04/20 1530 11/05/20 0326 11/06/20 0437  CREATININE 2.43* 2.30* 2.19* 2.00* 2.08*    Hyperkalemia due to acute kidney failure - resolved  Recent Labs  Lab 11/04/20 0726 11/04/20 1130 11/04/20 1530 11/05/20 0326 11/06/20 0437  K 6.7* 6.4* 5.6* 5.2* 4.4    HTN BP presently controlled  Hypomagnesemia  Corrected with supplementation  CAD status post PCI to  LAD continue usual home medical therapies  COPD No acute exacerbation presently   DM 2 CBG controlled   Code Status: FULL CODE Family Communication:  Status is: Inpatient  Remains inpatient appropriate because:Inpatient level of care appropriate due to severity of illness   Dispo: The patient is from: Home              Anticipated d/c is to: unclear               Anticipated d/c date is: 1 day              Patient currently is not medically stable to d/c.   Difficult to place patient No   Consultants:  none  Objective: Blood pressure 134/83, pulse 79, temperature 98.3 F (36.8 C), temperature source Oral, resp. rate 12, height 5\' 10"  (1.778 m), weight 80.7 kg, SpO2 95 %.  Intake/Output Summary (Last 24 hours) at 11/06/2020 0943 Last data filed at 11/06/2020 0606 Gross per 24 hour  Intake 3003.5 ml  Output 2150 ml  Net 853.5 ml   Filed Weights   11/04/20 0709  Weight: 80.7 kg    Examination: General: No acute respiratory distress Lungs: Clear to auscultation bilaterally -no wheezing Cardiovascular: RRR without murmur Abdomen: Mildly tender to palpation in the suprapubic region but abdomen otherwise benign, NT/ND, soft, BS positive Extremities: No edema bilateral lower extremities  CBC: Recent Labs  Lab 11/04/20 0204 11/05/20 0326  WBC 10.7* 9.3  NEUTROABS 8.3*  --  HGB 14.2 12.8*  HCT 42.4 37.5*  MCV 91.0 89.9  PLT 254 081   Basic Metabolic Panel: Recent Labs  Lab 11/04/20 1530 11/05/20 0326 11/06/20 0437  NA 142 144 141  K 5.6* 5.2* 4.4  CL 111 110 102  CO2 21* 25 31  GLUCOSE 101* 111* 98  BUN 70* 57* 37*  CREATININE 2.19* 2.00* 2.08*  CALCIUM 8.9 8.7* 8.0*  MG  --  1.6* 1.8   GFR: Estimated Creatinine Clearance: 26.3 mL/min (A) (by C-G formula based on SCr of 2.08 mg/dL (H)).  Liver Function Tests: No results for input(s): AST, ALT, ALKPHOS, BILITOT, PROT, ALBUMIN in the last 168 hours. No results for input(s): LIPASE, AMYLASE in the  last 168 hours. No results for input(s): AMMONIA in the last 168 hours.   HbA1C: Hgb A1c MFr Bld  Date/Time Value Ref Range Status  11/04/2020 05:40 AM 5.6 4.8 - 5.6 % Final    Comment:    (NOTE) Pre diabetes:          5.7%-6.4%  Diabetes:              >6.4%  Glycemic control for   <7.0% adults with diabetes   02/09/2016 01:21 AM 5.5 4.8 - 5.6 % Final    Comment:    (NOTE)         Pre-diabetes: 5.7 - 6.4         Diabetes: >6.4         Glycemic control for adults with diabetes: <7.0     CBG: Recent Labs  Lab 11/04/20 0426 11/04/20 0719 11/04/20 1232 11/04/20 1701  GLUCAP 92 83 196* 83    Recent Results (from the past 240 hour(s))  Urine culture     Status: Abnormal   Collection Time: 11/04/20  1:52 AM   Specimen: Urine, Random  Result Value Ref Range Status   Specimen Description   Final    URINE, RANDOM Performed at [facility], [address] [place], [place] [address]    Special Requests   Final    NONE Performed at [facility], [address] [place], [place] [address]    Culture (A)  Final    <10,000 COLONIES/mL INSIGNIFICANT GROWTH Performed at [facility], [place] [address], [place], [place] [address]    Report Status 11/05/2020 FINAL  Final  SARS Coronavirus 2 by RT PCR (hospital order, performed in [facility] hospital lab) Nasopharyngeal Nasopharyngeal Swab     Status: None   Collection Time: 11/04/20  4:02 AM   Specimen: Nasopharyngeal Swab  Result Value Ref Range Status   SARS Coronavirus 2 NEGATIVE NEGATIVE Final    Comment: (NOTE) SARS-CoV-2 target nucleic acids are NOT DETECTED.  The SARS-CoV-2 RNA is generally detectable in upper and lower respiratory specimens during the acute phase of infection. The lowest concentration of SARS-CoV-2 viral copies this assay can detect is 250 copies / mL. A negative result does not preclude SARS-CoV-2 infection and should not be used as the sole basis for treatment or  other patient management decisions.  A negative result may occur with improper specimen collection / handling, submission of specimen other than nasopharyngeal swab, presence of viral mutation(s) within the areas targeted by this assay, and inadequate number of viral copies (<250 copies / mL). A negative result must be combined with clinical observations, patient history, and epidemiological information.  Fact Sheet for Patients:   StrictlyIdeas.no  Fact Sheet for Healthcare Providers: BankingDealers.co.za  This test is not yet approved or  cleared by the [place] and has been authorized for detection and/or diagnosis of SARS-CoV-2 by FDA under an Emergency Use Authorization (EUA).  This EUA will remain in effect (meaning this test can be used) for the duration of the COVID-19 declaration under Section 564(b)(1) of the Act, 21 U.S.C. section 360bbb-3(b)(1), unless the authorization is terminated or revoked sooner.  Performed at [facility], [address] [place], [place] [address]      Scheduled Meds: . aspirin  81 mg Oral Daily  . Chlorhexidine Gluconate Cloth  6 each Topical Daily  . colchicine  0.3 mg Oral Daily  . doxazosin  4 mg Oral QHS  . enoxaparin (LOVENOX) injection  30 mg Subcutaneous Q24H  . finasteride  5 mg Oral Daily  . mometasone-formoterol  2 puff Inhalation BID  . pantoprazole  40 mg Oral Daily  . simvastatin  10 mg Oral QHS  . verapamil  240 mg Oral BID   Continuous Infusions: . sodium bicarbonate (isotonic) 150 mEq in D5W 1000 mL infusion 100 mL/hr at 11/06/20 0447     LOS: 2 days   Cherene Altes, MD [facility] Office  [phone] Pager - Text Page per Shea Evans  If 7PM-7AM, please contact night-coverage per Amion 11/06/2020, 9:43 AM

## 2020-11-06 NOTE — Progress Notes (Signed)
Initial Nutrition Assessment  DOCUMENTATION CODES:   Not applicable  INTERVENTION:  Ensure Enlive po BID, each supplement provides 350 kcal and 20 grams of protein   NUTRITION DIAGNOSIS:   Inadequate oral intake related to decreased appetite as evidenced by per patient/family report.    GOAL:   Patient will meet greater than or equal to 90% of their needs    MONITOR:   Labs,I & O's,Supplement acceptance,PO intake,Weight trends,Skin  REASON FOR ASSESSMENT:   Malnutrition Screening Tool    ASSESSMENT:  87 year old male admitted with AKI on CKD IIIb secondary to BPH with obstruction/LUTS. Past medical history significant of CAD, COPD, DM2, HTN, and BPH presented with complaints of painful urination and increased difficulty passing urine over the past several weeks.  RD working remotely.  Marked prostatic hypertrophy noted on CT renal study. Foley catheter placed in ED, plans to remain in place with outpatient urology follow-up.  RD attempted to contact pt via phone this morning, however no answer and unable to obtain nutrition history at this time. Per nutrition screening, he reports not eating well secondary to poor appetite.This morning, he ate 90% of breakfast. Per notes, pt had significant nausea yesterday afternoon, poor appetite and 0% intake of lunch and dinner meals.  No recent wt history for review. On 10/05/20 pt weighed 80.7 kg (177.54 lbs) which is also his current wt. Question if admit wt was stated. Given pt has been experienced decreased po intake, suspect some associated wt loss. Will order weight today as well as Ensure supplement to help him meet his needs. Will plan to complete exam at follow-up.   I/Os: -650.8 ml since admit UOP: 2150 ml x 24 hrs  Medications reviewed and include: Colchicine, Cardura, Protonix  Na bicarbonate 150 mEq in D5 @ 100 ml/hr  Labs: BUN 37 (H), Cr 2.08 (H), K 4.4 (WNL)  A1c 5.6 (WNL) on 11/04/20  NUTRITION - FOCUSED  PHYSICAL EXAM: Unable to complete at this time   Diet Order:   Diet Order            Diet heart healthy/carb modified Room service appropriate? Yes; Fluid consistency: Thin  Diet effective now                 EDUCATION NEEDS:   No education needs have been identified at this time  Skin:  Skin Assessment: Reviewed RN Assessment  Last BM:  1/25 - type 5 (small;brown)  Height:   Ht Readings from Last 1 Encounters:  11/04/20 5\' 10"  (1.778 m)    Weight:   Wt Readings from Last 1 Encounters:  11/04/20 80.7 kg    BMI:  Body mass index is 25.54 kg/m.  Estimated Nutritional Needs:   Kcal:  2000-2200  Protein:  85-100  Fluid:  2 L/day   Lajuan Lines, RD, LDN Clinical Nutrition After Hours/Weekend Pager # in [place]

## 2020-11-06 NOTE — Evaluation (Signed)
Physical Therapy Evaluation Patient Details Name: George [name] MRN: [id number] DOB: 05-Sep-1932 Today's Date: 11/06/2020   History of Present Illness  Pt is an 87yo M admitted to [facility] on 11/04/20 with c/o progressive urinary retention and dysuria. Pt dx with AKI, acute urinary retention, and severe hyperkalemia. Significant PMH includes: CAD s/p PCI, COPD, T2DM, HTN, HLD, Peyronie's disease, hypotestosteronemia, CKD (stage III), BPH. Imaging revealed prostatic hypertrophy with estimated prostatic volume of 180cc and mild bladder distention.    Clinical Impression  Pt is a 87 year old M admitted to hospital on 11/04/20 for urinary retention and dysuria. At baseline, pt reports being mod I for ADL's, cooking, and limited ambulation with [facility]; pt has housekeeper to assist with cleaning. Pt presents with functional weakness, decreased activity tolerance, increased pain due to dysuria/hematuria, and decreased balance resulting in impaired functional mobility. Due to deficits, pt required supervision for bed mobility, min assist - min guard for transfers, and min guard for short distance ambulation. Pt with RPE of 7-8/10 post ambulation, with associated dyspnea and increased RR, indicating "vigorous activity." Pt may benefit from use of RW during ambulation for improved balance, safety, and energy conservation. Deficits limit the pt's ability to safely and independently perform ADL's, transfer, and ambulate. Pt will benefit from acute skilled PT services to address deficits for return to baseline function. At this time, PT recommends DC home with HHPT, intermittent supervision, and RW for energy conservation and improved safety with functional mobility.     Follow Up Recommendations Home health PT;Supervision - Intermittent    Equipment Recommendations  Rolling walker with 5" wheels    Recommendations for Other Services       Precautions / Restrictions Precautions Precautions:  Fall Restrictions Weight Bearing Restrictions: No      Mobility  Bed Mobility Overal bed mobility: Needs Assistance Bed Mobility: Sit to Supine       Sit to supine: Supervision   General bed mobility comments: Pt seated in recliner upon entry; supervision to lie supine with use of BUE for support    Transfers Overall transfer level: Needs assistance   Transfers: Sit to/from Stand Sit to Stand: Min assist;Min guard         General transfer comment: Min assist for sit>stand from low surface height with SPC; min guard for controlled descent to sit EOB with SPC; fair eccentric control  Ambulation/Gait Ambulation/Gait assistance: Min guard Gait Distance (Feet): 60 Feet Assistive device: Straight cane       General Gait Details: Min guard for safety to ambulate short distance with SPC.  Pt demonstrated early reciprocal gait pattern with decreased step length/foot clearance bil, narrow BOS, and slowed cadence.     Balance Overall balance assessment: Needs assistance Sitting-balance support: No upper extremity supported;Feet supported Sitting balance-Leahy Scale: Good     Standing balance support: Single extremity supported;During functional activity Standing balance-Leahy Scale: Fair Standing balance comment: Fair standing balance with SPC requiring CGA for safety                             Pertinent Vitals/Pain Pain Assessment: Faces Faces Pain Scale: Hurts worst Pain Location: when urinating Pain Descriptors / Indicators: Burning Pain Intervention(s): Monitored during session    Home Living Family/patient expects to be discharged to:: Private residence Living Arrangements: Alone Available Help at Discharge: Friend(s);Neighbor;Available PRN/intermittently Type of Home: House Home Access: Stairs to enter Entrance Stairs-Rails: Can reach both Entrance Stairs-Number of  Steps: 4 Home Layout: Two level;Able to live on main level with  bedroom/bathroom Home Equipment: Kasandra Knudsen - single point;Shower seat      Prior Function Level of Independence: Independent with assistive device(s)         Comments: Pt reports being grossly mod I with use of SPC intermittent for ADL's, cooking, and limited comunity ambulation. Pt reports increased difficulty with managing ADL's. Pt has housekeepers that assist with cleaning.     Hand Dominance        Extremity/Trunk Assessment   Upper Extremity Assessment Upper Extremity Assessment: Defer to OT evaluation    Lower Extremity Assessment Lower Extremity Assessment: Overall WFL for tasks assessed    Cervical / Trunk Assessment Cervical / Trunk Assessment: Kyphotic  Communication   Communication: HOH  Cognition Arousal/Alertness: Awake/alert Behavior During Therapy: WFL for tasks assessed/performed Overall Cognitive Status: Within Functional Limits for tasks assessed                                 General Comments: Pt A&O x4 and was able to follwo 100% of two-step commands.      General Comments General comments (skin integrity, edema, etc.): Dysuria and hematuria    Exercises Other Exercises Other Exercises: Pt able to participate in bed mobility, transfers, and short distance ambulation with SPC. Grossly Min guard for safety to ambulate due to fair standing balance; pt may benefit from RW for improved energy conservation/balance. Pt with RPE of 7-8/10 post ambulation indicating "vigorous activity." Other Exercises: Pt educated regarding: PT role/POC, DC recommendations, safety with mobility.   Assessment/Plan    PT Assessment Patient needs continued PT services  PT Problem List Decreased mobility;Decreased activity tolerance;Decreased balance       PT Treatment Interventions Gait training;Stair training;Functional mobility training;Therapeutic activities;Therapeutic exercise;Balance training;Neuromuscular re-education    PT Goals (Current goals can be  found in the Care Plan section)  Acute Rehab PT Goals Patient Stated Goal: to get better PT Goal Formulation: With patient Time For Goal Achievement: 11/20/20 Potential to Achieve Goals: Good    Frequency Min 2X/week   Barriers to discharge           AM-PAC PT "6 Clicks" Mobility  Outcome Measure Help needed turning from your back to your side while in a flat bed without using bedrails?: None Help needed moving from lying on your back to sitting on the side of a flat bed without using bedrails?: A Little Help needed moving to and from a bed to a chair (including a wheelchair)?: A Little Help needed standing up from a chair using your arms (e.g., wheelchair or bedside chair)?: A Little Help needed to walk in hospital room?: A Little Help needed climbing 3-5 steps with a railing? : A Little 6 Click Score: 19    End of Session Equipment Utilized During Treatment: Gait belt Activity Tolerance: Patient tolerated treatment well;Patient limited by fatigue Patient left: in bed;with call bell/phone within reach;with bed alarm set Nurse Communication: Mobility status PT Visit Diagnosis: Unsteadiness on feet (R26.81)    Time: 4259-5638 PT Time Calculation (min) (ACUTE ONLY): 20 min   Charges:   PT Evaluation $PT Eval Low Complexity: 1 Low PT Treatments $Therapeutic Activity: 8-22 mins       Herminio Commons, PT, DPT 12:44 PM,11/06/20

## 2020-11-06 NOTE — Evaluation (Incomplete)
Occupational Therapy Evaluation Patient Details Name: George [name] MRN: [id number] DOB: [birth year] Today's Date: 11/06/2020    History of Present Illness Pt is an 87yo M admitted to [facility] on 11/04/20 with c/o progressive urinary retention and dysuria. Pt dx with AKI, acute urinary retention, and severe hyperkalemia. Significant PMH includes: CAD s/p PCI, COPD, T2DM, HTN, HLD, Peyronie's disease, hypotestosteronemia, CKD (stage III), BPH. Imaging revealed prostatic hypertrophy with estimated prostatic volume of 180cc and mild bladder distention.   Clinical Impression   Pt seen for OT treatment this d    Follow Up Recommendations  Home health OT;Supervision - Intermittent    Equipment Recommendations  3 in 1 bedside commode    Recommendations for Other Services       Precautions / Restrictions Precautions Precautions: Fall Restrictions Weight Bearing Restrictions: No      Mobility Bed Mobility Overal bed mobility: Needs Assistance Bed Mobility: Supine to Sit     Supine to sit: Min guard;HOB elevated Sit to supine: Supervision   General bed mobility comments: slight assist for trunk to come to EOB sitting, pt primarily able to utilize grab bar to facilitate    Transfers Overall transfer level: Needs assistance Equipment used: Straight cane Transfers: Sit to/from Stand Sit to Stand: Min assist;Min guard         General transfer comment: cues for safety, pacing, control of descent with stand to sit.    Balance Overall balance assessment: Needs assistance Sitting-balance support: No upper extremity supported;Feet supported Sitting balance-Leahy Scale: Good     Standing balance support: Single extremity supported;During functional activity Standing balance-Leahy Scale: Fair Standing balance comment: Fair standing balance with SPC requiring CGA for safety                           ADL either performed or assessed with clinical judgement   ADL                                          General ADL Comments: Pt requires SETUP for seated UB ADLs, CGA/MIN A for standing balance while performing standing UB ADLs. Pt requires MIN/MOD A for seated LB ADLs, MOD A for posterior peri care in standing after toileting. OT engages pt in UB/LB bathing in sitting, pt requires SETUP for UB and MIN/MOD For LB.     Vision Patient Visual Report: No change from baseline       Perception     Praxis      Pertinent Vitals/Pain Pain Assessment: Faces Faces Pain Scale: Hurts worst Pain Location: when urinating Pain Descriptors / Indicators: Burning Pain Intervention(s): Monitored during session     Hand Dominance     Extremity/Trunk Assessment Upper Extremity Assessment Upper Extremity Assessment: RUE deficits/detail;LUE deficits/detail RUE Deficits / Details: shld ROM 1/2 range, elbow and grip MMT grossly 4-/5 LUE Deficits / Details: very limited shoulder mobility, ~1/8 range. H/o injury per pt. Elbow/grip MMT grossly 3+/5   Lower Extremity Assessment Lower Extremity Assessment: Defer to PT evaluation;Overall [facility] for tasks assessed;Generalized weakness (ROM grossly WFL/appropriate for age, but pt notably, somewhat unstable in standing. states he hasn't been OOB since being hospitalized.)   Cervical / Trunk Assessment Cervical / Trunk Assessment: Kyphotic   Communication Communication Communication: HOH   Cognition Arousal/Alertness: Awake/alert Behavior During Therapy: WFL for tasks assessed/performed Overall Cognitive Status: Within Functional  Limits for tasks assessed                                 General Comments: Pt A&O x4 and was able to follow 100% of two-step commands.   General Comments  dyuria and hematuria noted in catheter as well as pt with c/o significant pain when voiding.    Exercises Other Exercises Other Exercises: OT engages pt in education re: role of OT, safety considerations including  call light use and use of chair alarm. Pt with good understanding. Other Exercises: Pt educated regarding: PT role/POC, DC recommendations, safety with mobility.   Shoulder Instructions      Home Living Family/patient expects to be discharged to:: Private residence Living Arrangements: Alone Available Help at Discharge: Friend(s);Neighbor;Available PRN/intermittently Type of Home: House Home Access: Stairs to enter [employer] of Steps: 4 Entrance Stairs-Rails: Can reach both Home Layout: Two level;Able to live on main level with bedroom/bathroom               Home Equipment: Kasandra Knudsen - single point;Shower seat          Prior Functioning/Environment Level of Independence: Independent with assistive device(s)        Comments: Pt reports being grossly mod I with use of SPC intermittently for ADL's, cooking, and limited comunity ambulation. Pt reports increased difficulty with managing ADL's. Pt has housekeepers that assist with cleaning.        OT Problem List: Decreased strength;Decreased activity tolerance;Pain      OT Treatment/Interventions: Self-care/ADL training;DME and/or AE instruction;Therapeutic activities;Balance training;Therapeutic exercise;Energy conservation;Patient/family education    OT Goals(Current goals can be found in the care plan section) Acute Rehab OT Goals Patient Stated Goal: to get better OT Goal Formulation: With patient Time For Goal Achievement: 11/20/20 Potential to Achieve Goals: Good ADL Goals Pt Will Perform Upper Body Dressing: Independently;sitting Pt Will Perform Lower Body Dressing: with supervision;sit to/from stand (with LRAD) Pt Will Transfer to Toilet: with supervision;ambulating;grab bars (with LRAD to restroom with BSC over standard commode to elevate height) Pt Will Perform Toileting - Clothing Manipulation and hygiene: with supervision;with min guard assist;sit to/from stand Pt/caregiver will Perform Home Exercise  Program: Increased strength;Both right and left upper extremity;With Supervision (minful of L UE shoulder issues.)  OT Frequency: Min 1X/week   Barriers to D/C:            Co-evaluation              AM-PAC OT "6 Clicks" Daily Activity     Outcome Measure Help from another person eating meals?: None Help from another person taking care of personal grooming?: None Help from another person toileting, which includes using toliet, bedpan, or urinal?: A Lot Help from another person bathing (including washing, rinsing, drying)?: A Lot Help from another person to put on and taking off regular upper body clothing?: A Little Help from another person to put on and taking off regular lower body clothing?: A Lot 6 Click Score: 17   End of Session Equipment Utilized During Treatment: Gait belt;Other (comment) [facility]) Nurse Communication: Mobility status;Other (comment) (notified that IV beeping)  Activity Tolerance: Patient tolerated treatment well Patient left: in chair;with call bell/phone within reach;with chair alarm set  OT Visit Diagnosis: Unsteadiness on feet (R26.81);Muscle weakness (generalized) (M62.81)                Time: 1610-9604 OT Time Calculation (min): 53 min  Charges:  OT General Charges $OT Visit: 1 Visit OT Evaluation $OT Eval Low Complexity: 1 Low OT Treatments $Self Care/Home Management : 23-37 mins $Therapeutic Activity: 8-22 mins  {enter signature dotphrase here}  Sharren Bridge 11/06/2020, 3:02 PM

## 2020-11-07 DIAGNOSIS — R300 Dysuria: Secondary | ICD-10-CM

## 2020-11-07 DIAGNOSIS — N179 Acute kidney failure, unspecified: Secondary | ICD-10-CM

## 2020-11-07 DIAGNOSIS — N189 Chronic kidney disease, unspecified: Secondary | ICD-10-CM

## 2020-11-07 DIAGNOSIS — R338 Other retention of urine: Secondary | ICD-10-CM

## 2020-11-07 LAB — BASIC METABOLIC PANEL
Anion gap: 8 (ref 5–15)
BUN: 32 mg/dL — ABNORMAL HIGH (ref 8–23)
CO2: 33 mmol/L — ABNORMAL HIGH (ref 22–32)
Calcium: 8.1 mg/dL — ABNORMAL LOW (ref 8.9–10.3)
Chloride: 104 mmol/L (ref 98–111)
Creatinine, Ser: 2.23 mg/dL — ABNORMAL HIGH (ref 0.61–1.24)
GFR, Estimated: 28 mL/min — ABNORMAL LOW (ref >60)
Glucose, Bld: 82 mg/dL (ref 70–99)
Potassium: 4.9 mmol/L (ref 3.5–5.1)
Sodium: 145 mmol/L (ref 135–145)

## 2020-11-07 MED ORDER — SODIUM CHLORIDE 0.9 % IV SOLN
1.0000 g | INTRAVENOUS | Status: DC
  Administered 2020-11-07 – 2020-11-09 (×3): 1 g via INTRAVENOUS
  Filled 2020-11-07: qty 1
  Filled 2020-11-07: qty 10
  Filled 2020-11-07: qty 1
  Filled 2020-11-07: qty 10

## 2020-11-07 NOTE — Progress Notes (Signed)
Mobility Specialist - Progress Note   11/07/20 1524  Mobility  Activity Ambulated in room;Ambulated in hall  Level of Assistance Minimal assist, patient does 75% or more  Assistive Device Front wheel walker  Distance Ambulated (ft) 80 ft  Mobility Response Tolerated well  Mobility performed by Mobility specialist  $Mobility charge 1 Mobility    Pt laying in bed upon arrival. Pt agreed to session. Pt HOH. Pt able to donn socks on R foot only. Pain in L shoulder limits his ability to donn sock on L foot. Pt rocks for momentum to S2S to RW w/ CGA. Pt ambulated 80' total in room and hallway utilizing RW w/ min. Assist for managing RW. Noted pt would rest L forearm on RW during ambulation. VC required to keep RW close, hand placement and safety. Pt declined further ambulation d/t having discomfort in L shoulder from utilizing RW. Pt states he prefers his cane. Pt seated back to EOB. OT entered the room. In conclusion, pt tolerated mobility session well. Pt left sitting EOB w/ OT present in room.    Yahira Timberman Mobility Specialist  11/07/20, 3:30 PM

## 2020-11-07 NOTE — Care Management Important Message (Signed)
Important Message  Patient Details  Name: George [name] MRN: [id number] Date of Birth: Aug 19, 1932   Medicare Important Message Given:  Yes     Juliann Pulse A Marthella Osorno 11/07/2020, 11:43 AM

## 2020-11-07 NOTE — Progress Notes (Signed)
Occupational Therapy Treatment Patient Details Name: George [name] MRN: [id number] DOB: 08-21-1932 Today's Date: 11/07/2020    History of present illness Pt is an 87yo M admitted to [facility] on 11/04/20 with c/o progressive urinary retention and dysuria. Pt dx with AKI, acute urinary retention, and severe hyperkalemia. Significant PMH includes: CAD s/p PCI, COPD, T2DM, HTN, HLD, Peyronie's disease, hypotestosteronemia, CKD (stage III), BPH. Imaging revealed prostatic hypertrophy with estimated prostatic volume of 180cc and mild bladder distention.   OT comments  George [name] was seen for OT treatment on this date. Upon arrival to room pt seated EOB with mobility tech, pt fatigued following short ambulation. Pt required SETUP face washing and tooth brushing. MIN A don R sock seated EOB. Pt instructed on IS (provided) frequency and use. Pt left reclined in bed with all needs in reach, bed alarm set. Pt making good progress toward goals. Pt continues to benefit from skilled OT services to maximize return to PLOF and minimize risk of future falls, injury, caregiver burden, and readmission. Will continue to follow POC. Discharge recommendation remains appropriate.    Follow Up Recommendations  Home health OT;Supervision - Intermittent    Equipment Recommendations  3 in 1 bedside commode    Recommendations for Other Services      Precautions / Restrictions Precautions Precautions: Fall Restrictions Weight Bearing Restrictions: No       Mobility Bed Mobility Overal bed mobility: Needs Assistance Bed Mobility: Sit to Supine       Sit to supine: Supervision      Transfers                      Balance Overall balance assessment: Needs assistance Sitting-balance support: No upper extremity supported;Feet supported Sitting balance-Leahy Scale: Good                                     ADL either performed or assessed with clinical judgement   ADL Overall ADL's  : Needs assistance/impaired                                       General ADL Comments: SETUP face washing and tooth brushing. MIN A don R sock seated EOB               Cognition Arousal/Alertness: Awake/alert Behavior During Therapy: WFL for tasks assessed/performed Overall Cognitive Status: Within Functional Limits for tasks assessed                                          Exercises Exercises: Other exercises Other Exercises Other Exercises: Pt educated re: DME recs, d/c recs, falls prevention, ECS, HEP, IS (frequency and use) Other Exercises: LBD, face washing, tooth brushing           Pertinent Vitals/ Pain       Pain Assessment: Faces Faces Pain Scale: No hurt         Frequency  Min 1X/week        Progress Toward Goals  OT Goals(current goals can now be found in the care plan section)  Progress towards OT goals: Progressing toward goals  Acute Rehab OT Goals Patient Stated Goal: to get better OT Goal Formulation:  With patient Time For Goal Achievement: 11/20/20 Potential to Achieve Goals: Good ADL Goals Pt Will Perform Upper Body Dressing: Independently;sitting Pt Will Perform Lower Body Dressing: with supervision;sit to/from stand Pt Will Transfer to Toilet: with supervision;ambulating;grab bars Pt Will Perform Toileting - Clothing Manipulation and hygiene: with supervision;with min guard assist;sit to/from stand Pt/caregiver will Perform Home Exercise Program: Increased strength;Both right and left upper extremity;With Supervision  Plan Discharge plan remains appropriate;Frequency remains appropriate       AM-PAC OT "6 Clicks" Daily Activity     Outcome Measure   Help from another person eating meals?: None Help from another person taking care of personal grooming?: None Help from another person toileting, which includes using toliet, bedpan, or urinal?: A Lot Help from another person bathing (including washing,  rinsing, drying)?: A Lot Help from another person to put on and taking off regular upper body clothing?: A Little Help from another person to put on and taking off regular lower body clothing?: A Lot 6 Click Score: 17    End of Session    OT Visit Diagnosis: Unsteadiness on feet (R26.81);Muscle weakness (generalized) (M62.81)   Activity Tolerance Patient limited by fatigue   Patient Left in bed;with call bell/phone within reach;with bed alarm set   Nurse Communication          Time: 2620-3559 OT Time Calculation (min): 12 min  Charges: OT General Charges $OT Visit: 1 Visit OT Treatments $Self Care/Home Management : 8-22 mins  Dessie Coma, M.S. OTR/L  11/07/20, 4:09 PM  ascom [phone]

## 2020-11-07 NOTE — Progress Notes (Signed)
PT Cancellation Note  Patient Details Name: George [name] MRN: [id number] DOB: [birth year]   Cancelled Treatment:    PT attempt. Pt currently working with OT. Will return when pt is available.    Willette Pa 11/07/2020, 3:29 PM

## 2020-11-07 NOTE — TOC Initial Note (Signed)
Transition of Care [facility]) - Initial/Assessment Note    Patient Details  Name: George [name] MRN: [id number] Date of Birth: 12-24-1931  Transition of Care [facility]) CM/SW Contact:    Magnus Ivan, LCSW Phone Number: 11/07/2020, 11:13 AM  Clinical Narrative:                CSW met with patient at bedside. Patient extremely hard of hearing. Patient reported he lives alone and drives himself to appointments. PCP is Dr. Lovie Macadamia. Pharmacy is [facility] in [place]. Patient has a cane at home. Patient's home address is [place], [place]. He says it does not always show up on GPS but is on the end of [address]. Explained PT recommendations for HHPT, OT, RW, and 3 in 1. Patient says he is ok with whatever is recommended. Denied agency preference. Referral to [facility] for 3 in 1 and RW. Referral to Advanced Representative Corene Cornea for HHPT, OT and RN.   Expected Discharge Plan: [place] Barriers to Discharge: Continued Medical Work up   Patient Goals and CMS Choice Patient states their goals for this hospitalization and ongoing recovery are:: home with home health CMS Medicare.gov Compare Post Acute Care list provided to:: Patient Choice offered to / list presented to : Patient  Expected Discharge Plan and Services Expected Discharge Plan: [place]       Living arrangements for the past 2 months: [place] [facility] [place]                 DME Arranged: 3-N-1,[place] rolling DME Agency: [employer] Date DME Agency Contacted: 11/07/20   Representative spoke with at DME Agency: Nash Shearer [place]: PT,OT,RN [place] Agency: [place] ([place]) Date [place]: 11/07/20   Representative spoke with at [place]: Floydene Flock  Prior Living Arrangements/Services Living arrangements for the past 2 months: [place] Lives with:: Self Patient language and need for interpreter reviewed:: Yes Do  you feel safe going back to the place where you live?: Yes      Need for Family Participation in Patient Care: Yes (Comment) Care giver support system in place?: Yes (comment) Current home services: DME Criminal Activity/Legal Involvement Pertinent to Current Situation/Hospitalization: No - Comment as needed  Activities of Daily Living Home Assistive Devices/Equipment: Cane (specify quad or straight) (straight) ADL Screening (condition at time of admission) Patient's cognitive ability adequate to safely complete daily activities?: Yes Is the patient deaf or have difficulty hearing?: Yes (ueses hearing aids) Does the patient have difficulty seeing, even when wearing glasses/contacts?: No Does the patient have difficulty concentrating, remembering, or making decisions?: No Patient able to express need for assistance with ADLs?: Yes Does the patient have difficulty dressing or bathing?: No Independently performs ADLs?: Yes (appropriate for developmental age) Does the patient have difficulty walking or climbing stairs?: No Weakness of Legs: None Weakness of Arms/Hands: Left  Permission Sought/Granted Permission sought to share information with : Chartered certified accountant granted to share information with : Yes, Verbal Permission Granted     Permission granted to share info w AGENCY: [place] & DME agencies        Emotional Assessment       Orientation: : Oriented to Self,Oriented to Place,Oriented to  Time,Oriented to Situation Alcohol / Substance Use: Not Applicable Psych Involvement: No (comment)  Admission diagnosis:  Hyperkalemia [E87.5] Enlarged prostate [N40.0] Obstructive uropathy [N13.9] Acute renal failure, unspecified acute renal  failure type [facility]) [N17.9] Patient Active Problem List   Diagnosis Date Noted  . Acute kidney injury superimposed on CKD IIIb ([place]) 11/04/2020  . BPH with obstruction/lower urinary tract symptoms 11/04/2020  . Hyperkalemia  11/04/2020  . Acute urinary retention 11/04/2020  . Community acquired pneumonia 06/25/2019  . Pneumonia 06/25/2019  . Foot pain 07/11/2018  . Urinary urgency 06/27/2017  . Erectile dysfunction 06/27/2017  . Stage 3 acute kidney injury ([place]) 06/27/2017  . Hypogonadism in male 06/27/2017  . Vomiting 02/09/2016  . Slurred speech 02/09/2016  . Nausea and vomiting 02/09/2016  . Lower abdominal pain 02/09/2016  . Tobacco abuse 02/09/2016  . Essential hypertension 02/09/2016  . GERD (gastroesophageal reflux disease) 02/09/2016  . Hyperlipidemia 04/22/2014  . Hypertension 04/22/2014  . COPD (chronic obstructive pulmonary disease) ([place]) 04/22/2014  . Diabetes mellitus type 2, uncomplicated ([place]) 44/12/4740  . CAD (coronary artery disease) 09/10/1998   PCP:  Juluis Pitch, MD Pharmacy:   [place], [place] [address] [address] Phone: [phone] Fax: [phone]     Social Determinants of Health (SDOH) Interventions    Readmission Risk Interventions No flowsheet data found.

## 2020-11-07 NOTE — Progress Notes (Addendum)
PROGRESS NOTE    George [name]  ZWC:[id number] DOB: 04-15-1932 DOA: 11/04/2020 PCP: Juluis Pitch, MD    Assessment & Plan:   Active Problems:   Essential hypertension   CAD (coronary artery disease)   COPD (chronic obstructive pulmonary disease) (HCC)   Diabetes mellitus type 2, uncomplicated (HCC)   Acute kidney injury superimposed on CKD IIIb ([place])   BPH with obstruction/lower urinary tract symptoms   Hyperkalemia   Acute urinary retention   Acute urinary retention: severe BPH w/ obstruction. Continue on cardura, finasteride, flomax. Continue w/ foley even at d/c and pt will f/u outpatient w/ urology  AKI on CKDIIIb: likely secondary to above. Cr is trending up today.   Dysuria: possible UTI. Urine cx is pending. Will start IV ceftriaxone   Hyperkalemia: resolved  HTN: continue on home dose of verapamil   Hypomagnesemia: will continue to monitor   CAD: s/p stent placed. Continue on aspirin, statin   COPD: w/o exacerbation. Continue on bronchodilators and encourage incentive spirometry  DM2: diet controlled. Not on SSI   HLD: continue on statin    DVT prophylaxis: lovenox Code Status: full  Family Communication:  Disposition Plan: likely d/c home w/ home health   Level of care: Med-Surg   Status is: Inpatient  Remains inpatient appropriate because:IV treatments appropriate due to intensity of illness or inability to take PO   Dispo:  Patient From: Home  Planned Disposition: Home  Expected discharge date: 11/07/2020  Medically stable for discharge: No      Consultants:      Procedures:    Antimicrobials:  Ceftriaxone    Subjective: Pt c/o significant burning w/ urination.  Objective: Vitals:   11/07/20 0008 11/07/20 0411 11/07/20 0500 11/07/20 0748  BP: 122/65 (!) 142/76  137/67  Pulse: 77 75  81  Resp: 18 18  17   Temp: 98.6 F (37 C) 98 F (36.7 C)  98.6 F (37 C)  TempSrc:      SpO2: 93% 90%  90%  Weight:   79.5 kg    Height:        Intake/Output Summary (Last 24 hours) at 11/07/2020 1126 Last data filed at 11/07/2020 1026 Gross per 24 hour  Intake 240 ml  Output 1450 ml  Net -1210 ml   Filed Weights   11/04/20 0709 11/07/20 0500  Weight: 80.7 kg 79.5 kg    Examination:  General exam: Appears calm and comfortable. Very hard of hearing  Respiratory system: Clear to auscultation. Respiratory effort normal. Cardiovascular system: S1 & S2 +. No rubs, gallops or clicks.  Gastrointestinal system: Abdomen is nondistended, soft and nontender. Normal bowel sounds heard. Central nervous system: Alert and oriented. Moves all 4 extremities  Psychiatry: Judgement and insight appear normal. Flat mood and affect      Data Reviewed: I have personally reviewed following labs and imaging studies  CBC: Recent Labs  Lab 11/04/20 0204 11/05/20 0326  WBC 10.7* 9.3  NEUTROABS 8.3*  --   HGB 14.2 12.8*  HCT 42.4 37.5*  MCV 91.0 89.9  PLT 254 235   Basic Metabolic Panel: Recent Labs  Lab 11/04/20 1130 11/04/20 1530 11/05/20 0326 11/06/20 0437 11/07/20 0324  NA 143 142 144 141 145  K 6.4* 5.6* 5.2* 4.4 4.9  CL 114* 111 110 102 104  CO2 18* 21* 25 31 33*  GLUCOSE 98 101* 111* 98 82  BUN 77* 70* 57* 37* 32*  CREATININE 2.30* 2.19* 2.00* 2.08* 2.23*  CALCIUM 9.2  8.9 8.7* 8.0* 8.1*  MG  --   --  1.6* 1.8  --    GFR: Estimated Creatinine Clearance: 24.6 mL/min (A) (by C-G formula based on SCr of 2.23 mg/dL (H)). Liver Function Tests: No results for input(s): AST, ALT, ALKPHOS, BILITOT, PROT, ALBUMIN in the last 168 hours. No results for input(s): LIPASE, AMYLASE in the last 168 hours. No results for input(s): AMMONIA in the last 168 hours. Coagulation Profile: No results for input(s): INR, PROTIME in the last 168 hours. Cardiac Enzymes: No results for input(s): CKTOTAL, CKMB, CKMBINDEX, TROPONINI in the last 168 hours. BNP (last 3 results) No results for input(s): PROBNP in the last 8760  hours. HbA1C: No results for input(s): HGBA1C in the last 72 hours. CBG: Recent Labs  Lab 11/04/20 0426 11/04/20 0719 11/04/20 1232 11/04/20 1701  GLUCAP 92 83 196* 83   Lipid Profile: No results for input(s): CHOL, HDL, LDLCALC, TRIG, CHOLHDL, LDLDIRECT in the last 72 hours. Thyroid Function Tests: No results for input(s): TSH, T4TOTAL, FREET4, T3FREE, THYROIDAB in the last 72 hours. Anemia Panel: No results for input(s): VITAMINB12, FOLATE, FERRITIN, TIBC, IRON, RETICCTPCT in the last 72 hours. Sepsis Labs: No results for input(s): PROCALCITON, LATICACIDVEN in the last 168 hours.  Recent Results (from the past 240 hour(s))  Urine culture     Status: Abnormal   Collection Time: 11/04/20  1:52 AM   Specimen: Urine, Random  Result Value Ref Range Status   Specimen Description   Final    URINE, RANDOM Performed at [facility], [address] [place], [place] [address]    Special Requests   Final    NONE Performed at [facility], [address] [place], [place] [address]    Culture (A)  Final    <10,000 COLONIES/mL INSIGNIFICANT GROWTH Performed at [place] [address] [place],  [address]    Report Status 11/05/2020 FINAL  Final  SARS Coronavirus 2 by RT PCR (hospital order, performed in [facility] hospital lab) Nasopharyngeal Nasopharyngeal Swab     Status: None   Collection Time: 11/04/20  4:02 AM   Specimen: Nasopharyngeal Swab  Result Value Ref Range Status   SARS Coronavirus 2 NEGATIVE NEGATIVE Final    Comment: (NOTE) SARS-CoV-2 target nucleic acids are NOT DETECTED.  The SARS-CoV-2 RNA is generally detectable in upper and lower respiratory specimens during the acute phase of infection. The lowest concentration of SARS-CoV-2 viral copies this assay can detect is 250 copies / mL. A negative result does not preclude SARS-CoV-2 infection and should not be used as the sole basis for treatment or other patient management  decisions.  A negative result may occur with improper specimen collection / handling, submission of specimen other than nasopharyngeal swab, presence of viral mutation(s) within the areas targeted by this assay, and inadequate number of viral copies (<250 copies / mL). A negative result must be combined with clinical observations, patient history, and epidemiological information.  Fact Sheet for Patients:   StrictlyIdeas.no  Fact Sheet for Healthcare Providers: BankingDealers.co.za  This test is not yet approved or  cleared by the [place] FDA and has been authorized for detection and/or diagnosis of SARS-CoV-2 by FDA under an Emergency Use Authorization (EUA).  This EUA will remain in effect (meaning this test can be used) for the duration of the COVID-19 declaration under Section 564(b)(1) of the Act, 21 U.S.C. section 360bbb-3(b)(1), unless the authorization is terminated or revoked sooner.  Performed at  [facility], [address] [place], [place] [address]          Radiology Studies: No results found.      Scheduled Meds: . aspirin  81 mg Oral Daily  . Chlorhexidine Gluconate Cloth  6 each Topical Daily  . colchicine  0.3 mg Oral Daily  . doxazosin  4 mg Oral QHS  . enoxaparin (LOVENOX) injection  30 mg Subcutaneous Q24H  . feeding supplement  237 mL Oral BID BM  . finasteride  5 mg Oral Daily  . mometasone-formoterol  2 puff Inhalation BID  . pantoprazole  40 mg Oral Daily  . simvastatin  10 mg Oral QHS  . verapamil  240 mg Oral BID   Continuous Infusions: . cefTRIAXone (ROCEPHIN)  IV 1 g (11/07/20 1005)  . sodium bicarbonate (isotonic) 150 mEq in D5W 1000 mL infusion 100 mL/hr at 11/06/20 1108     LOS: 3 days    Time spent: 35 mins     Wyvonnia Dusky, MD [facility] Pager [phone]  If 7PM-7AM, please contact night-coverage 11/07/2020, 11:26 AM

## 2020-11-08 DIAGNOSIS — N390 Urinary tract infection, site not specified: Secondary | ICD-10-CM

## 2020-11-08 LAB — BASIC METABOLIC PANEL
Anion gap: 10 (ref 5–15)
BUN: 30 mg/dL — ABNORMAL HIGH (ref 8–23)
CO2: 28 mmol/L (ref 22–32)
Calcium: 8.5 mg/dL — ABNORMAL LOW (ref 8.9–10.3)
Chloride: 104 mmol/L (ref 98–111)
Creatinine, Ser: 2.03 mg/dL — ABNORMAL HIGH (ref 0.61–1.24)
GFR, Estimated: 31 mL/min — ABNORMAL LOW (ref >60)
Glucose, Bld: 74 mg/dL (ref 70–99)
Potassium: 4.4 mmol/L (ref 3.5–5.1)
Sodium: 142 mmol/L (ref 135–145)

## 2020-11-08 LAB — CBC
HCT: 37.6 % — ABNORMAL LOW (ref 39.0–52.0)
Hemoglobin: 12.7 g/dL — ABNORMAL LOW (ref 13.0–17.0)
MCH: 30.3 pg (ref 26.0–34.0)
MCHC: 33.8 g/dL (ref 30.0–36.0)
MCV: 89.7 fL (ref 80.0–100.0)
Platelets: 201 10*3/uL (ref 150–400)
RBC: 4.19 MIL/uL — ABNORMAL LOW (ref 4.22–5.81)
RDW: 12.9 % (ref 11.5–15.5)
WBC: 8.5 10*3/uL (ref 4.0–10.5)
nRBC: 0 % (ref 0.0–0.2)

## 2020-11-08 MED ORDER — POLYETHYLENE GLYCOL 3350 17 G PO PACK
17.0000 g | PACK | Freq: Every day | ORAL | Status: DC
  Administered 2020-11-08 – 2020-11-09 (×2): 17 g via ORAL
  Filled 2020-11-08 (×2): qty 1

## 2020-11-08 MED ORDER — OXYBUTYNIN CHLORIDE 5 MG PO TABS
5.0000 mg | ORAL_TABLET | Freq: Three times a day (TID) | ORAL | Status: DC
  Administered 2020-11-08 – 2020-11-09 (×4): 5 mg via ORAL
  Filled 2020-11-08 (×6): qty 1

## 2020-11-08 MED ORDER — BISACODYL 5 MG PO TBEC: 10.0000 mg | DELAYED_RELEASE_TABLET | Freq: Every day | ORAL | Status: DC | PRN

## 2020-11-08 MED ORDER — DOCUSATE SODIUM 100 MG PO CAPS
200.0000 mg | ORAL_CAPSULE | Freq: Two times a day (BID) | ORAL | Status: DC
  Administered 2020-11-08 – 2020-11-09 (×3): 200 mg via ORAL
  Filled 2020-11-08 (×3): qty 2

## 2020-11-08 NOTE — Progress Notes (Signed)
Physical Therapy Treatment Patient Details Name: George [name] MRN: [id number] DOB: 07-15-1932 Today's Date: 11/08/2020    History of Present Illness Pt is an 87yo M admitted to [facility] on 11/04/20 with c/o progressive urinary retention and dysuria. Pt dx with AKI, acute urinary retention, and severe hyperkalemia. Significant PMH includes: CAD s/p PCI, COPD, T2DM, HTN, HLD, Peyronie's disease, hypotestosteronemia, CKD (stage III), BPH. Imaging revealed prostatic hypertrophy with estimated prostatic volume of 180cc and mild bladder distention.    PT Comments    Pt was asleep in long sitting upon arriving. He easily awakes and is cooperative however extremely HOH. Does better when talking into R ear. He was easily able to exit bed, stand, and ambulate with SPC. Performed stair training with CGA to simulate home entry. While performing stairs, pt becomes extremely fatigued. HR 138 and sao2 89%. He reports needing to sit quickly with SOB noted. Pt quickly ambulated back to rm to sit and rest. Sao2 recovers to 94% after 30 sec of sitting. Took ~ 2 minutes for HR and SOB to resolve. Lengthy discussion about DC disposition. " I will do whatever you recommend. I would prefer to go home." He does report having several available friends to come assist him but not 24/7. Pt will greatly benefit from continued skilled PT at DC to address deficits while assisting pt to PLOF. Will leave recommendations for HHPT however may need to change to SNF if endurance deficits/ safety concerns do not improve. Author will have pt seen on 1/30 for another acute PT session.    Follow Up Recommendations  Home health PT;Supervision - Intermittent;Other (comment) (pt reports he has serveral people willing to assist him when he gets home)     Equipment Recommendations  None recommended by PT (pt has recieved all neccessary equiment)    Recommendations for Other Services       Precautions / Restrictions  Precautions Precautions: Fall Restrictions Weight Bearing Restrictions: No    Mobility  Bed Mobility Overal bed mobility: Modified Independent     Transfers Overall transfer level: Modified independent     Ambulation/Gait Ambulation/Gait assistance: Min guard Gait Distance (Feet): 100 Feet Assistive device: Straight cane (Highly recommend use of RW in future) Gait Pattern/deviations: Step-through pattern Gait velocity: WNL   General Gait Details: Pt was able to ambulate a total of 100 ft with SPC with CGA. performed stairs halway and then has severe fatigue. Pt rushes to return to room to catch his breath. He was SOB with minimal activity. sao2 89% on rm air that quickly recovers to 94%. HR elevated to 135bpm during stair training.   Stairs Stairs: Yes Stairs assistance: Min guard Stair Management: One rail Right;With cane Number of Stairs: 6 General stair comments: Pt performed ascending/descending stairs in stairwell. Pt fatigued extremely quickly with performance an requested to get back to room to sit quickly.       Balance Overall balance assessment: Needs assistance Sitting-balance support: No upper extremity supported;Feet supported Sitting balance-Leahy Scale: Good     Standing balance support: Single extremity supported;During functional activity Standing balance-Leahy Scale: Fair Standing balance comment: Fair standing balance with SPC requiring CGA for safety         Cognition Arousal/Alertness: Awake/alert Behavior During Therapy: WFL for tasks assessed/performed Overall Cognitive Status: Within Functional Limits for tasks assessed            General Comments: Pt is A and O x 4 but extremely [facility]  Pertinent Vitals/Pain Pain Assessment: No/denies pain Pain Score: 0-No pain           PT Goals (current goals can now be found in the care plan section) Acute Rehab PT Goals Patient Stated Goal: to get better Progress towards PT  goals: Progressing toward goals    Frequency    Min 2X/week      PT Plan Current plan remains appropriate       AM-PAC PT "6 Clicks" Mobility   Outcome Measure  Help needed turning from your back to your side while in a flat bed without using bedrails?: None Help needed moving from lying on your back to sitting on the side of a flat bed without using bedrails?: A Little Help needed moving to and from a bed to a chair (including a wheelchair)?: A Little Help needed standing up from a chair using your arms (e.g., wheelchair or bedside chair)?: A Little Help needed to walk in hospital room?: A Little Help needed climbing 3-5 steps with a railing? : A Little 6 Click Score: 19    End of Session Equipment Utilized During Treatment: Gait belt Activity Tolerance: Patient tolerated treatment well;Patient limited by fatigue Patient left: in chair;with call bell/phone within reach;with chair alarm set Nurse Communication: Mobility status PT Visit Diagnosis: Unsteadiness on feet (R26.81)     Time: 4174-0814 PT Time Calculation (min) (ACUTE ONLY): 18 min  Charges:  $Gait Training: 8-22 mins                     Julaine Fusi PTA 11/08/20, 1:08 PM

## 2020-11-08 NOTE — Progress Notes (Signed)
PROGRESS NOTE    George [name]  MCN:[id number] DOB: [birth year] DOA: 11/04/2020 PCP: Juluis Pitch, MD    Assessment & Plan:   Active Problems:   Essential hypertension   CAD (coronary artery disease)   COPD (chronic obstructive pulmonary disease) (HCC)   Diabetes mellitus type 2, uncomplicated (HCC)   Acute kidney injury superimposed on CKD IIIb ([place])   BPH with obstruction/lower urinary tract symptoms   Hyperkalemia   Acute urinary retention   Acute urinary retention: severe BPH w/ obstruction. Continue on cardura, finasteride, flomax. Continue w/ foley even at d/c and pt will f/u outpatient w/ urology. Started on oxybutynin for bladder spasma  AKI on CKDIIIb: likely secondary to above. Cr is trending down today.   Dysuria: possible UTI. Urine cx shows insignificant growth. Will continue on IV ceftriaxone   Hyperkalemia: resolved  HTN: continue on CCB  Hypomagnesemia: will monitor periodically   CAD: s/p stent placed. Continue on statin, aspirin   COPD: w/o exacerbation. Continue on bronchodilators and encourage incentive spirometry  DM2: diet controlled. Not on insulin currently   HLD: continue on statin    DVT prophylaxis: lovenox Code Status: full  Family Communication: discussed pt's care w/ pt's granddaughter, Carmell Austria, and answered her questions  Disposition Plan: likely d/c home w/ home health   Level of care: Med-Surg   Status is: Inpatient  Remains inpatient appropriate because:IV treatments appropriate due to intensity of illness or inability to take PO   Dispo:  Patient From: Home  Planned Disposition: Home w/ home health   Expected discharge date: 11/09/20  Medically stable for discharge: No      Consultants:      Procedures:    Antimicrobials:  Ceftriaxone    Subjective: Pt c/o intermittent bladder pains  Objective: Vitals:   11/07/20 1955 11/07/20 2330 11/08/20 0327 11/08/20 0701  BP: (!) 155/72 126/70 137/74   Pulse:  73 70 79   Resp: (!) 22 18 16    Temp: 98.2 F (36.8 C) 98.7 F (37.1 C) 98.3 F (36.8 C)   TempSrc: Oral Oral Oral   SpO2: 91% 94% 93%   Weight:    79.5 kg  Height:        Intake/Output Summary (Last 24 hours) at 11/08/2020 0725 Last data filed at 11/08/2020 0327 Gross per 24 hour  Intake 340 ml  Output 1550 ml  Net -1210 ml   Filed Weights   11/04/20 0709 11/07/20 0500 11/08/20 0701  Weight: 80.7 kg 79.5 kg 79.5 kg    Examination:  General exam: Very hard of hearing. Appears calm & comfortable  Respiratory system: clear breath sounds b/l  Cardiovascular system: S1/S2+. No rubs or clicks  Gastrointestinal system: Abd is soft, ND & suprapubic tenderness  Central nervous system: Alert and oriented. Moves all 4 extremities   Psychiatry: Judgement and insight appear normal. Flat mood and affect      Data Reviewed: I have personally reviewed following labs and imaging studies  CBC: Recent Labs  Lab 11/04/20 0204 11/05/20 0326 11/08/20 0611  WBC 10.7* 9.3 8.5  NEUTROABS 8.3*  --   --   HGB 14.2 12.8* 12.7*  HCT 42.4 37.5* 37.6*  MCV 91.0 89.9 89.7  PLT 254 220 629   Basic Metabolic Panel: Recent Labs  Lab 11/04/20 1530 11/05/20 0326 11/06/20 0437 11/07/20 0324 11/08/20 0611  NA 142 144 141 145 142  K 5.6* 5.2* 4.4 4.9 4.4  CL 111 110 102 104 104  CO2 21* 25  31 33* 28  GLUCOSE 101* 111* 98 82 74  BUN 70* 57* 37* 32* 30*  CREATININE 2.19* 2.00* 2.08* 2.23* 2.03*  CALCIUM 8.9 8.7* 8.0* 8.1* 8.5*  MG  --  1.6* 1.8  --   --    GFR: Estimated Creatinine Clearance: 27 mL/min (A) (by C-G formula based on SCr of 2.03 mg/dL (H)). Liver Function Tests: No results for input(s): AST, ALT, ALKPHOS, BILITOT, PROT, ALBUMIN in the last 168 hours. No results for input(s): LIPASE, AMYLASE in the last 168 hours. No results for input(s): AMMONIA in the last 168 hours. Coagulation Profile: No results for input(s): INR, PROTIME in the last 168 hours. Cardiac Enzymes: No  results for input(s): CKTOTAL, CKMB, CKMBINDEX, TROPONINI in the last 168 hours. BNP (last 3 results) No results for input(s): PROBNP in the last 8760 hours. HbA1C: No results for input(s): HGBA1C in the last 72 hours. CBG: Recent Labs  Lab 11/04/20 0426 11/04/20 0719 11/04/20 1232 11/04/20 1701  GLUCAP 92 83 196* 83   Lipid Profile: No results for input(s): CHOL, HDL, LDLCALC, TRIG, CHOLHDL, LDLDIRECT in the last 72 hours. Thyroid Function Tests: No results for input(s): TSH, T4TOTAL, FREET4, T3FREE, THYROIDAB in the last 72 hours. Anemia Panel: No results for input(s): VITAMINB12, FOLATE, FERRITIN, TIBC, IRON, RETICCTPCT in the last 72 hours. Sepsis Labs: No results for input(s): PROCALCITON, LATICACIDVEN in the last 168 hours.  Recent Results (from the past 240 hour(s))  Urine culture     Status: Abnormal   Collection Time: 11/04/20  1:52 AM   Specimen: Urine, Random  Result Value Ref Range Status   Specimen Description   Final    URINE, RANDOM Performed at [facility], [address] [place], [place] [address]    Special Requests   Final    NONE Performed at [facility], [address], [place], [place] [address]    Culture (A)  Final    <10,000 COLONIES/mL INSIGNIFICANT GROWTH Performed at [facility] [address], [place], [place] [address]    Report Status 11/05/2020 FINAL  Final  SARS Coronavirus 2 by RT PCR (hospital order, performed in [facility] hospital lab) Nasopharyngeal Nasopharyngeal Swab     Status: None   Collection Time: 11/04/20  4:02 AM   Specimen: Nasopharyngeal Swab  Result Value Ref Range Status   SARS Coronavirus 2 NEGATIVE NEGATIVE Final    Comment: (NOTE) SARS-CoV-2 target nucleic acids are NOT DETECTED.  The SARS-CoV-2 RNA is generally detectable in upper and lower respiratory specimens during the acute phase of infection. The lowest concentration of SARS-CoV-2 viral copies this assay can detect is  250 copies / mL. A negative result does not preclude SARS-CoV-2 infection and should not be used as the sole basis for treatment or other patient management decisions.  A negative result may occur with improper specimen collection / handling, submission of specimen other than nasopharyngeal swab, presence of viral mutation(s) within the areas targeted by this assay, and inadequate number of viral copies (<250 copies / mL). A negative result must be combined with clinical observations, patient history, and epidemiological information.  Fact Sheet for Patients:   StrictlyIdeas.no  Fact Sheet for Healthcare Providers: BankingDealers.co.za  This test is not yet approved or  cleared by the [place] FDA and has been authorized for detection and/or diagnosis of SARS-CoV-2 by FDA under an Emergency Use Authorization (EUA).  This EUA will remain in effect (meaning this test can be used) for  the duration of the COVID-19 declaration under Section 564(b)(1) of the Act, 21 U.S.C. section 360bbb-3(b)(1), unless the authorization is terminated or revoked sooner.  Performed at [facility], [address] [place], [place] [address]          Radiology Studies: No results found.      Scheduled Meds: . aspirin  81 mg Oral Daily  . Chlorhexidine Gluconate Cloth  6 each Topical Daily  . colchicine  0.3 mg Oral Daily  . doxazosin  4 mg Oral QHS  . enoxaparin (LOVENOX) injection  30 mg Subcutaneous Q24H  . feeding supplement  237 mL Oral BID BM  . finasteride  5 mg Oral Daily  . mometasone-formoterol  2 puff Inhalation BID  . pantoprazole  40 mg Oral Daily  . simvastatin  10 mg Oral QHS  . verapamil  240 mg Oral BID   Continuous Infusions: . cefTRIAXone (ROCEPHIN)  IV Stopped (11/07/20 1030)  . sodium bicarbonate (isotonic) 150 mEq in D5W 1000 mL infusion 100 mL/hr at 11/06/20 1108     LOS: 4 days    Time spent: 30   mins     Wyvonnia Dusky, MD [facility] Pager [phone]  If 7PM-7AM, please contact night-coverage 11/08/2020, 7:25 AM

## 2020-11-09 LAB — BASIC METABOLIC PANEL WITH GFR
Anion gap: 10 (ref 5–15)
BUN: 32 mg/dL — ABNORMAL HIGH (ref 8–23)
CO2: 25 mmol/L (ref 22–32)
Calcium: 8.3 mg/dL — ABNORMAL LOW (ref 8.9–10.3)
Chloride: 104 mmol/L (ref 98–111)
Creatinine, Ser: 1.85 mg/dL — ABNORMAL HIGH (ref 0.61–1.24)
GFR, Estimated: 35 mL/min — ABNORMAL LOW
Glucose, Bld: 79 mg/dL (ref 70–99)
Potassium: 4.1 mmol/L (ref 3.5–5.1)
Sodium: 139 mmol/L (ref 135–145)

## 2020-11-09 LAB — CBC
HCT: 34.9 % — ABNORMAL LOW (ref 39.0–52.0)
Hemoglobin: 12.1 g/dL — ABNORMAL LOW (ref 13.0–17.0)
MCH: 30.9 pg (ref 26.0–34.0)
MCHC: 34.7 g/dL (ref 30.0–36.0)
MCV: 89.3 fL (ref 80.0–100.0)
Platelets: 198 10*3/uL (ref 150–400)
RBC: 3.91 MIL/uL — ABNORMAL LOW (ref 4.22–5.81)
RDW: 13.1 % (ref 11.5–15.5)
WBC: 8.7 10*3/uL (ref 4.0–10.5)
nRBC: 0 % (ref 0.0–0.2)

## 2020-11-09 MED ORDER — ENOXAPARIN SODIUM 40 MG/0.4ML ~~LOC~~ SOLN
40.0000 mg | SUBCUTANEOUS | Status: DC
  Administered 2020-11-09: 40 mg via SUBCUTANEOUS
  Filled 2020-11-09: qty 0.4

## 2020-11-09 MED ORDER — FINASTERIDE 5 MG PO TABS: 5.0000 mg | ORAL_TABLET | Freq: Every day | ORAL | 0 refills | Status: DC

## 2020-11-09 MED ORDER — OXYBUTYNIN CHLORIDE 5 MG PO TABS: 5.0000 mg | ORAL_TABLET | Freq: Three times a day (TID) | ORAL | 0 refills | Status: AC

## 2020-11-09 MED ORDER — CIPROFLOXACIN HCL 500 MG PO TABS: 500.0000 mg | ORAL_TABLET | Freq: Two times a day (BID) | ORAL | 0 refills | Status: AC

## 2020-11-09 NOTE — Consult Note (Signed)
PHARMACIST - PHYSICIAN COMMUNICATION  CONCERNING:  Enoxaparin (Lovenox) for DVT Prophylaxis    RECOMMENDATION: Patient was prescribed enoxaprin 30mg  q24 hours for VTE prophylaxis.   Filed Weights   11/04/20 0709 11/07/20 0500 11/08/20 0701  Weight: 80.7 kg (178 lb) 79.5 kg (175 lb 4.3 oz) 79.5 kg (175 lb 3.2 oz)    Body mass index is 25.14 kg/m.  Estimated Creatinine Clearance: 29.6 mL/min (A) (by C-G formula based on SCr of 1.85 mg/dL (H)).   Patient is candidate for enoxaparin 40mg  every 24 hours based on CrCl >85ml/min  DESCRIPTION: Pharmacy has adjusted enoxaparin dose per [facility] policy.  Patient is now receiving enoxaparin 40 mg every 24 hours    Lu Duffel, PharmD, BCPS Clinical Pharmacist 11/09/2020 7:37 AM

## 2020-11-09 NOTE — Progress Notes (Signed)
Occupational Therapy Treatment Patient Details Name: George [name] MRN: [id number] DOB: 1932/05/18 Today's Date: 11/09/2020    History of present illness Pt is an 87yo M admitted to [facility] on 11/04/20 with c/o progressive urinary retention and dysuria. Pt dx with AKI, acute urinary retention, and severe hyperkalemia. Significant PMH includes: CAD s/p PCI, COPD, T2DM, HTN, HLD, Peyronie's disease, hypotestosteronemia, CKD (stage III), BPH. Imaging revealed prostatic hypertrophy with estimated prostatic volume of 180cc and mild bladder distention.   OT comments  Pt and granddaughter seen for family training session prior to pt d/c this afternoon. Instructed in OT role, DME recs, d/c recs, falls prevention, home/routines modifications, sling mgmt, and ECS. Granddaughter return verbalized understanding of instruction provided. MOD A don LUE sling and jacket seated EOB. Pt continues to benefit from skilled OT services to maximize return to PLOF and minimize risk of future falls, injury, caregiver burden, and readmission. Will continue to follow POC. Discharge recommendation remains appropriate.    Follow Up Recommendations  Home health OT;Supervision/Assistance - 24 hour    Equipment Recommendations  3 in 1 bedside commode    Recommendations for Other Services      Precautions / Restrictions Precautions Precautions: Fall Restrictions Weight Bearing Restrictions: No       Mobility Bed Mobility Overal bed mobility: Modified Independent                   Balance Overall balance assessment: Needs assistance Sitting-balance support: No upper extremity supported;Feet supported Sitting balance-Leahy Scale: Good     Standing balance support: Single extremity supported;During functional activity Standing balance-Leahy Scale: Fair                             ADL either performed or assessed with clinical judgement   ADL Overall ADL's : Needs assistance/impaired                                        General ADL Comments: MOD A don LUE sling and jacket seated EOB               Cognition Arousal/Alertness: Awake/alert Behavior During Therapy: WFL for tasks assessed/performed Overall Cognitive Status: Within Functional Limits for tasks assessed                                 General Comments: Pt is A and O x 4 but extremely HOH        Exercises Exercises: Other exercises Other Exercises Other Exercises: Pt and granddaughter educated re: OT role, DME recs, d/c recs, falls prevention, home/routines modifications, sling mgmt, ECS Other Exercises: UBD, sitting balance/tolerance           Pertinent Vitals/ Pain       Pain Assessment: No/denies pain     Prior Functioning/Environment              Frequency  Min 1X/week        Progress Toward Goals  OT Goals(current goals can now be found in the care plan section)  Progress towards OT goals: Progressing toward goals  Acute Rehab OT Goals Patient Stated Goal: to get better OT Goal Formulation: With patient Time For Goal Achievement: 11/20/20 Potential to Achieve Goals: Good ADL Goals Pt Will Perform Upper Body Dressing: Independently;sitting Pt  Will Perform Lower Body Dressing: with supervision;sit to/from stand Pt Will Transfer to Toilet: with supervision;ambulating;grab bars Pt Will Perform Toileting - Clothing Manipulation and hygiene: with supervision;with min guard assist;sit to/from stand Pt/caregiver will Perform Home Exercise Program: Increased strength;Both right and left upper extremity;With Supervision  Plan Discharge plan remains appropriate;Frequency remains appropriate       AM-PAC OT "6 Clicks" Daily Activity     Outcome Measure   Help from another person eating meals?: None Help from another person taking care of personal grooming?: None Help from another person toileting, which includes using toliet, bedpan, or urinal?: A  Little Help from another person bathing (including washing, rinsing, drying)?: A Little Help from another person to put on and taking off regular upper body clothing?: A Lot Help from another person to put on and taking off regular lower body clothing?: A Lot 6 Click Score: 18    End of Session    OT Visit Diagnosis: Unsteadiness on feet (R26.81);Muscle weakness (generalized) (M62.81)   Activity Tolerance Patient tolerated treatment well   Patient Left in bed;with nursing/sitter in room;with family/visitor present   Nurse Communication          Time: 3790-2409 OT Time Calculation (min): 12 min  Charges: OT General Charges $OT Visit: 1 Visit OT Treatments $Self Care/Home Management : 8-22 mins  Dessie Coma, M.S. OTR/L  11/09/20, 2:20 PM  ascom [phone]

## 2020-11-09 NOTE — Progress Notes (Signed)
This RN provided discharge instructions and teaching to the patient and the patient's granddaughter. The patient and their granddaughter verbalized and demonstrated understanding of the provided instructions. All outstanding questions were resolved. L arm PIV removed. Cannula intact. Pt tolerated well. Foley left in place per order. Drainage bag switched to leg bag. All belongings packed and in tow. Transport discharged patient to private vehicle via wheelchair.

## 2020-11-09 NOTE — Progress Notes (Signed)
Occupational Therapy Treatment Patient Details Name: George [name] MRN: [id number] DOB: 18-Apr-1932 Today's Date: 11/09/2020    History of present illness Pt is an 87yo M admitted to [facility] on 11/04/20 with c/o progressive urinary retention and dysuria. Pt dx with AKI, acute urinary retention, and severe hyperkalemia. Significant PMH includes: CAD s/p PCI, COPD, T2DM, HTN, HLD, Peyronie's disease, hypotestosteronemia, CKD (stage III), BPH. Imaging revealed prostatic hypertrophy with estimated prostatic volume of 180cc and mild bladder distention.   OT comments  George [name] was seen for OT treatment on this date. Upon arrival to room pt reclined in bed agreeable to tx. MOD I bed mobility and requires MAX A don LUE sling seated EOB. MIN A down gown seated EOB - assist for threading over LUE. Trialed RW use however pt demonstrated difficulty with LUE in sling, further mobility trialed with SPC.   Pt initially requires CGA + SPC for ~60 ft functional mobility prior to requesting rest break - pt demonstrates poor eccentric control and decreased safety awareness in attempting to sit quickly. Pt demonstrates increased WOB and reports feeling SOB however SpO2 93% on RA t/o. Pt requires ~2 min seated rest break prior to returning to room, ~43ft mobility required MIN A + SPC - assist for balance (moderate LOBs and tandem step noted with fatigue). Pt granddaughter called and educated on ECS. Pt making good progress toward goals. Pt continues to benefit from skilled OT services to maximize return to PLOF and minimize risk of future falls, injury, caregiver burden, and readmission. Will continue to follow POC. Discharge recommendation remains appropriate.    Follow Up Recommendations  Home health OT;Supervision/Assistance - 24 hour    Equipment Recommendations  3 in 1 bedside commode    Recommendations for Other Services      Precautions / Restrictions Precautions Precautions: Fall Restrictions Weight  Bearing Restrictions: No       Mobility Bed Mobility Overal bed mobility: Modified Independent                Transfers Overall transfer level: Needs assistance Equipment used: Straight cane Transfers: Sit to/from Stand Sit to Stand: Supervision         General transfer comment: cues for safety, pacing, control of descent with stand to sit.    Balance Overall balance assessment: Needs assistance Sitting-balance support: No upper extremity supported;Feet supported Sitting balance-Leahy Scale: Good     Standing balance support: Single extremity supported;During functional activity Standing balance-Leahy Scale: Fair Standing balance comment: requires CGA static standing, decreasing MIN A with fatigue                           ADL either performed or assessed with clinical judgement   ADL Overall ADL's : Needs assistance/impaired                                       General ADL Comments: MAX A don LUE sling seated EOB. MIN A down gown seated EOB - assist for threading over LUE. MIN A + SPC for ~60 ft x2 functional mobility - pt requires assist for balance (moderate LOBs in prolonged standing) and for safety VCs.               Cognition Arousal/Alertness: Awake/alert Behavior During Therapy: WFL for tasks assessed/performed Overall Cognitive Status: Within Functional Limits for tasks assessed  General Comments: Pt is A and O x 4 but extremely HOH        Exercises Exercises: Other exercises Other Exercises Other Exercises: Pt educated re: OT role, DME recs, d/c recs, falls prevention, ECS, home/routines modifications Other Exercises: UBD, ~60 ft x2 mobility, sup<>sit, sit<>stand, sitting/standing balance/tolerance      General Comments SpO2 stable 93% on RA t/o mobility    Pertinent Vitals/ Pain       Pain Assessment: No/denies pain         Frequency  Min 1X/week         Progress Toward Goals  OT Goals(current goals can now be found in the care plan section)     Acute Rehab OT Goals Patient Stated Goal: to get better OT Goal Formulation: With patient Time For Goal Achievement: 11/20/20 Potential to Achieve Goals: Good  Plan         AM-PAC OT "6 Clicks" Daily Activity     Outcome Measure   Help from another person eating meals?: None Help from another person taking care of personal grooming?: None Help from another person toileting, which includes using toliet, bedpan, or urinal?: A Lot Help from another person bathing (including washing, rinsing, drying)?: A Lot Help from another person to put on and taking off regular upper body clothing?: A Little Help from another person to put on and taking off regular lower body clothing?: A Lot 6 Click Score: 17    End of Session Equipment Utilized During Treatment: Gait belt;Other (comment) (SPC)  OT Visit Diagnosis: Unsteadiness on feet (R26.81);Muscle weakness (generalized) (M62.81)   Activity Tolerance Patient limited by fatigue   Patient Left in bed;with call bell/phone within reach   Nurse Communication Mobility status        Time: 5277-8242 OT Time Calculation (min): 25 min  Charges: OT General Charges $OT Visit: 1 Visit OT Treatments $Self Care/Home Management : 23-37 mins  Dessie Coma, M.S. OTR/L  11/09/20, 11:31 AM  ascom [phone]

## 2020-11-09 NOTE — Plan of Care (Signed)
  Problem: Education: Goal: Knowledge of General Education information will improve Description: Including pain rating scale, medication(s)/side effects and non-pharmacologic comfort measures 11/09/2020 1051 by Cristela Blue, RN Outcome: Progressing 11/09/2020 1051 by Cristela Blue, RN Outcome: Progressing   Problem: Health Behavior/Discharge Planning: Goal: Ability to manage health-related needs will improve 11/09/2020 1051 by Cristela Blue, RN Outcome: Progressing 11/09/2020 1051 by Cristela Blue, RN Outcome: Progressing   Problem: Clinical Measurements: Goal: Ability to maintain clinical measurements within normal limits will improve 11/09/2020 1051 by Cristela Blue, RN Outcome: Progressing 11/09/2020 1051 by Cristela Blue, RN Outcome: Progressing Goal: Will remain free from infection 11/09/2020 1051 by Cristela Blue, RN Outcome: Progressing 11/09/2020 1051 by Cristela Blue, RN Outcome: Progressing Goal: Diagnostic test results will improve 11/09/2020 1051 by Cristela Blue, RN Outcome: Progressing 11/09/2020 1051 by Cristela Blue, RN Outcome: Progressing Goal: Respiratory complications will improve 11/09/2020 1051 by Cristela Blue, RN Outcome: Progressing 11/09/2020 1051 by Cristela Blue, RN Outcome: Progressing Goal: Cardiovascular complication will be avoided 11/09/2020 1051 by Cristela Blue, RN Outcome: Progressing 11/09/2020 1051 by Cristela Blue, RN Outcome: Progressing   Problem: Activity: Goal: Risk for activity intolerance will decrease 11/09/2020 1051 by Cristela Blue, RN Outcome: Progressing 11/09/2020 1051 by Cristela Blue, RN Outcome: Progressing   Problem: Nutrition: Goal: Adequate nutrition will be maintained 11/09/2020 1051 by Cristela Blue, RN Outcome: Progressing 11/09/2020 1051 by Cristela Blue, RN Outcome: Progressing   Problem: Coping: Goal: Level of anxiety will decrease 11/09/2020 1051 by Cristela Blue, RN Outcome:  Progressing 11/09/2020 1051 by Cristela Blue, RN Outcome: Progressing   Problem: Elimination: Goal: Will not experience complications related to bowel motility 11/09/2020 1051 by Cristela Blue, RN Outcome: Progressing 11/09/2020 1051 by Cristela Blue, RN Outcome: Progressing Goal: Will not experience complications related to urinary retention 11/09/2020 1051 by Cristela Blue, RN Outcome: Progressing 11/09/2020 1051 by Cristela Blue, RN Outcome: Progressing   Problem: Pain Managment: Goal: General experience of comfort will improve 11/09/2020 1051 by Cristela Blue, RN Outcome: Progressing 11/09/2020 1051 by Cristela Blue, RN Outcome: Progressing   Problem: Safety: Goal: Ability to remain free from injury will improve 11/09/2020 1051 by Cristela Blue, RN Outcome: Progressing 11/09/2020 1051 by Cristela Blue, RN Outcome: Progressing   Problem: Skin Integrity: Goal: Risk for impaired skin integrity will decrease 11/09/2020 1051 by Cristela Blue, RN Outcome: Progressing 11/09/2020 1051 by Cristela Blue, RN Outcome: Progressing

## 2020-11-09 NOTE — Discharge Summary (Signed)
Physician Discharge Summary  George [name] MMC:[id number] DOB: [birth year] DOA: 11/04/2020  PCP: [name], MD  Admit date: 11/04/2020 Discharge date: 11/09/2020  Admitted From: home Disposition: home   Recommendations for Outpatient Follow-up:  1. Follow up with PCP in 1-2 weeks 2. F/u urology in 1 week   Home Health: yes Equipment/Devices:  Discharge Condition: stable  CODE STATUS: full  Diet recommendation: Heart Healthy  Brief/Interim Summary: HPI was taken from Dr. Para March: George [name] is a 87 y.o. male with medical history significant for CAD with history of PCI to LAD, COPD, DM, HTN, BPH, CKD 3b who presents to the emergency room with complaints of increasing difficulty passing urine over the past several weeks and on the night of arrival went he was having urinary frequency but passing only dribbles of urine with painful urination.  After this went on for several hours he decided to come into the emergency room to be evaluated.    Denies abdominal pain, nausea vomiting or diarrhea.  Denies cough, fever or chills. ED Course: On arrival, afebrile, BP 133/70, tachycardic at 125 with O2 sat 96% on room air on his blood work WBC slightly elevated at 10.7, potassium of 7.2, with creatinine of 2.8, up from 1.37.  Troponin was 12.  Urinalysis pending.  Covid PCR negative EKG as interpreted by me: Sinus tachycardia with RBBB.  Peak T waves not seen Imaging: Ultrasound, renal stone study showsMarked prostatic hypertrophy with an estimated prostatic volume of approximately 180 cc. Mild bladder distension  Patient had Foley catheter placed in the ER.  Hyperkalemia was treated with calcium gluconate, IV bicarb, insulin and dextrose.  Hospitalist consulted for admission.  Hospital Course from Dr. Mayford Knife 1/25-1/30/22: Pt presented urinary retention and a foley was placed in the ER. The pt will be d/c home w/ the foley and will f/u w/ urology outpatient for a voiding trial. Pt was  also found to have UTI and was started on IV ceftriaxone and d/c w/ po cipro to finish the course at home. PT/OT evaluated the pt and recommend home health. Home health was set by CM prior to d/c. For more information, please see previous progress/consult notes.  Discharge Diagnoses:  Active Problems:   Essential hypertension   CAD (coronary artery disease)   COPD (chronic obstructive pulmonary disease) (HCC)   Diabetes mellitus type 2, uncomplicated (HCC)   Acute kidney injury superimposed on CKD IIIb (HCC)   BPH with obstruction/lower urinary tract symptoms   Hyperkalemia   Acute urinary retention  Acute urinary retention: severe BPH w/ obstruction. Continue on cardura, finasteride, flomax. Continue w/ foley even at d/c and pt will f/u outpatient w/ urology. Continue on oxybutynin for bladder spasms while foley is in place   AKI on CKDIIIb: likely secondary to above. Cr continues to trend down   Dysuria: possible UTI. Urine cx shows insignificant growth. Will switch to po cipro at d/c  Hyperkalemia: resolved  HTN: continue on CCB  Hypomagnesemia: will monitor periodically   CAD: s/p stent placed. Continue on statin, aspirin   COPD: w/o exacerbation. Continue on bronchodilators and encourage incentive spirometry  DM2: diet controlled. Not on insulin currently   HLD: continue on statin   Discharge Instructions  Discharge Instructions    Diet - low sodium heart healthy   Complete by: As directed    Discharge instructions   Complete by: As directed    F/u w/ PCP in 1-2 weeks. F/u w/ urology in 1 week  Increase activity slowly   Complete by: As directed      Allergies as of 11/09/2020   No Known Allergies     Medication List    TAKE these medications   aspirin 81 MG EC tablet Chew 81 mg by mouth daily.   ciprofloxacin 500 MG tablet Commonly known as: Cipro Take 1 tablet (500 mg total) by mouth 2 (two) times daily for 4 days.   cyanocobalamin 1000  MCG/ML injection Commonly known as: (VITAMIN B-12) Inject 1,000 mcg into the muscle every 30 (thirty) days.   doxazosin 4 MG tablet Commonly known as: CARDURA Take 4 mg by mouth at bedtime.   finasteride 5 MG tablet Commonly known as: PROSCAR Take 1 tablet (5 mg total) by mouth daily.   Fluticasone-Salmeterol 250-50 MCG/DOSE Aepb Commonly known as: ADVAIR Inhale 1 puff into the lungs 2 (two) times daily.   lisinopril 20 MG tablet Commonly known as: ZESTRIL Take 20 mg by mouth 2 (two) times daily.   omeprazole 20 MG capsule Commonly known as: PRILOSEC Take 20 mg by mouth daily.   oxybutynin 5 MG tablet Commonly known as: DITROPAN Take 1 tablet (5 mg total) by mouth 3 (three) times daily for 21 days. Take this medication only while you have foley catheter   sildenafil 20 MG tablet Commonly known as: REVATIO Take 40-100 mg by mouth as needed (for ED).   simvastatin 20 MG tablet Commonly known as: ZOCOR Take 10 mg by mouth at bedtime.   testosterone cypionate 200 MG/ML injection Commonly known as: DEPOTESTOSTERONE CYPIONATE Inject 200 mg into the muscle every 14 (fourteen) days.   verapamil 240 MG CR tablet Commonly known as: CALAN-SR Take 240 mg by mouth 2 (two) times daily.            Durable Medical Equipment  (From admission, onward)         Start     Ordered   11/06/20 1545  For home use only DME Walker rolling  Once       Question Answer Comment  Walker: With [place] Wheels   Patient needs a walker to treat with the following condition Physical deconditioning      11/06/20 1544   11/06/20 1545  For home use only DME 3 n 1  Once        11/06/20 1544          Follow-up Information    Juluis Pitch, MD Follow up in 2 week(s).   Specialty: Family Medicine Contact information: [address] [address] [phone]              No Known Allergies  Consultations:   Procedures/Studies: MR SHOULDER LEFT WO  CONTRAST  Result Date: 10/19/2020 CLINICAL DATA:  Left shoulder pain and limited range of motion since fall 2 weeks ago. EXAM: MRI OF THE LEFT SHOULDER WITHOUT CONTRAST TECHNIQUE: Multiplanar, multisequence MR imaging of the shoulder was performed. No intravenous contrast was administered. COMPARISON:  Left shoulder x-rays dated October 05, 2020. FINDINGS: Rotator cuff: Full-thickness, full width tears of the supraspinatus and infraspinatus tendons with approximately 4 cm retraction to the medial humeral head. The subscapularis and teres minor tendons are intact. Muscles: Mild supraspinatus and infraspinatus muscle edema. No muscle atrophy. Biceps long head: Normally position. Focal tendinosis and partial tearing as it enters the bicipital groove. Acromioclavicular Joint: Mild arthropathy of the acromioclavicular joint. Type II acromion. Small amount of fluid in the subacromial/subdeltoid bursa. Glenohumeral Joint: Small joint  effusion. Scattered partial-thickness cartilage loss over the humeral head and glenoid. Subchondral cyst in the inferior glenoid. Labrum: Grossly intact, but evaluation is limited by lack of intraarticular fluid. Bones: High-riding humeral head. No acute fracture or dislocation. No suspicious bone lesion. Other: None. IMPRESSION: 1. Full-thickness, full width tears of the supraspinatus and infraspinatus tendons with retraction to the medial humeral head. No muscle atrophy. 2. Focal tendinosis and partial tearing of the biceps long head tendon as it enters the bicipital groove. 3. Mild acromioclavicular and glenohumeral osteoarthritis. Electronically Signed   By: Titus Dubin M.D.   On: 10/19/2020 13:32   DG Chest Port 1 View  Result Date: 11/04/2020 CLINICAL DATA:  Hyperkalemia EXAM: PORTABLE CHEST 1 VIEW COMPARISON:  06/25/2019 FINDINGS: Interstitial prominence is again noted, unchanged. No superimposed focal pulmonary infiltrate. Pulmonary insufflation is normal and symmetric. No  pneumothorax or pleural effusion. Cardiac size within normal limits. No acute bone abnormality. IMPRESSION: No active disease. Electronically Signed   By: Fidela Salisbury MD   On: 11/04/2020 04:26   CT Renal Stone Study  Result Date: 11/04/2020 CLINICAL DATA:  Urinary retention, dysuria, perineal pain EXAM: CT ABDOMEN AND PELVIS WITHOUT CONTRAST TECHNIQUE: Multidetector CT imaging of the abdomen and pelvis was performed following the standard protocol without IV contrast. COMPARISON:  06/30/2011 FINDINGS: Imaging is slightly limited by motion artifact. Lower chest: Severe emphysema with hyperinflation of the lung bases. Extensive coronary artery calcification. Global cardiac size within normal limits. Small hiatal hernia. Hepatobiliary: Cholelithiasis without pericholecystic inflammatory change. Liver unremarkable. No intra or extrahepatic biliary ductal dilation. Pancreas: Unremarkable Spleen: Unremarkable Adrenals/Urinary Tract: Adrenal glands are unremarkable. 2 simple cysts are seen within the interpolar region of the left kidney slightly enlarged since prior examination measuring up to 2.6 cm in greatest dimension. The kidneys are otherwise unremarkable. The bladder is mildly distended. Marked prostatic hypertrophy indents the base of the bladder. Stomach/Bowel: Sigmoid colectomy has been performed. There is moderate to severe diverticulosis of the residual distal colon. The stomach, small bowel, and large bowel are otherwise unremarkable. No evidence of obstruction or focal inflammation. Appendix normal. No free intraperitoneal gas or fluid. Vascular/Lymphatic: Extensive aortoiliac atherosclerotic calcification. No aneurysm. No pathologic adenopathy within the abdomen and pelvis. Reproductive: There is marked prostatic hypertrophy noted, particularly centrally. The prostate gland has an estimated prostatic volume of approximately 180 cc. The seminal vesicles are unremarkable. Left orchiectomy has been  performed. Other: The rectum is unremarkable.  No body wall hernia identified. Musculoskeletal: No acute bone abnormality within the abdomen and pelvis. Degenerative changes are seen throughout the lumbar spine. IMPRESSION: Marked prostatic hypertrophy with an estimated prostatic volume of approximately 180 cc. Mild bladder distension. Severe emphysema. Extensive coronary artery calcification. Severe distal colonic diverticulosis. No superimposed inflammatory change. Aortic Atherosclerosis (ICD10-I70.0). Electronically Signed   By: Fidela Salisbury MD   On: 11/04/2020 04:00      Subjective: Pt c/o fatigue    Discharge Exam: Vitals:   11/09/20 0508 11/09/20 0824  BP: 126/71 (!) 152/71  Pulse: 67 70  Resp: 17 18  Temp: 98.1 F (36.7 C) 98.3 F (36.8 C)  SpO2: 94% 95%   Vitals:   11/08/20 2143 11/09/20 0001 11/09/20 0508 11/09/20 0824  BP: (!) 148/77 131/72 126/71 (!) 152/71  Pulse: (!) 56 66 67 70  Resp: 18 19 17 18   Temp: 98.3 F (36.8 C) 98.4 F (36.9 C) 98.1 F (36.7 C) 98.3 F (36.8 C)  TempSrc: Oral Oral Oral   SpO2: 97% 96%  94% 95%  Weight:      Height:        General: Pt is alert, awake, not in acute distress Cardiovascular: S1/S2 +, no rubs, no gallops Respiratory: CTA bilaterally, no wheezing, no rhonchi Abdominal: Soft, NT, ND, bowel sounds + Extremities: no cyanosis    The results of significant diagnostics from this hospitalization (including imaging, microbiology, ancillary and laboratory) are listed below for reference.     Microbiology: Recent Results (from the past 240 hour(s))  Urine culture     Status: Abnormal   Collection Time: 11/04/20  1:52 AM   Specimen: Urine, Random  Result Value Ref Range Status   Specimen Description   Final    URINE, RANDOM Performed at [facility], [address] [place], [place] [address]    Special Requests   Final    NONE Performed at [facility], [address], [place], [place] [address]     Culture (A)  Final    <10,000 COLONIES/mL INSIGNIFICANT GROWTH Performed at [facility] [address], [place], [place] [address]    Report Status 11/05/2020 FINAL  Final  SARS Coronavirus 2 by RT PCR (hospital order, performed in [facility] hospital lab) Nasopharyngeal Nasopharyngeal Swab     Status: None   Collection Time: 11/04/20  4:02 AM   Specimen: Nasopharyngeal Swab  Result Value Ref Range Status   SARS Coronavirus 2 NEGATIVE NEGATIVE Final    Comment: (NOTE) SARS-CoV-2 target nucleic acids are NOT DETECTED.  The SARS-CoV-2 RNA is generally detectable in upper and lower respiratory specimens during the acute phase of infection. The lowest concentration of SARS-CoV-2 viral copies this assay can detect is 250 copies / mL. A negative result does not preclude SARS-CoV-2 infection and should not be used as the sole basis for treatment or other patient management decisions.  A negative result may occur with improper specimen collection / handling, submission of specimen other than nasopharyngeal swab, presence of viral mutation(s) within the areas targeted by this assay, and inadequate number of viral copies (<250 copies / mL). A negative result must be combined with clinical observations, patient history, and epidemiological information.  Fact Sheet for Patients:   StrictlyIdeas.no  Fact Sheet for Healthcare Providers: BankingDealers.co.za  This test is not yet approved or  cleared by the [place] FDA and has been authorized for detection and/or diagnosis of SARS-CoV-2 by FDA under an Emergency Use Authorization (EUA).  This EUA will remain in effect (meaning this test can be used) for the duration of the COVID-19 declaration under Section 564(b)(1) of the Act, 21 U.S.C. section 360bbb-3(b)(1), unless the authorization is terminated or revoked sooner.  Performed at [facility], [address]  [place], [place] [address]      Labs: BNP (last 3 results) No results for input(s): BNP in the last 8760 hours. Basic Metabolic Panel: Recent Labs  Lab 11/05/20 0326 11/06/20 0437 11/07/20 0324 11/08/20 0611 11/09/20 0637  NA 144 141 145 142 139  K 5.2* 4.4 4.9 4.4 4.1  CL 110 102 104 104 104  CO2 25 31 33* 28 25  GLUCOSE 111* 98 82 74 79  BUN 57* 37* 32* 30* 32*  CREATININE 2.00* 2.08* 2.23* 2.03* 1.85*  CALCIUM 8.7* 8.0* 8.1* 8.5* 8.3*  MG 1.6* 1.8  --   --   --    Liver Function Tests: No results for input(s): AST, ALT, ALKPHOS, BILITOT, PROT, ALBUMIN in the last 168 hours. No  results for input(s): LIPASE, AMYLASE in the last 168 hours. No results for input(s): AMMONIA in the last 168 hours. CBC: Recent Labs  Lab 11/04/20 0204 11/05/20 0326 11/08/20 0611 11/09/20 0637  WBC 10.7* 9.3 8.5 8.7  NEUTROABS 8.3*  --   --   --   HGB 14.2 12.8* 12.7* 12.1*  HCT 42.4 37.5* 37.6* 34.9*  MCV 91.0 89.9 89.7 89.3  PLT 254 220 201 198   Cardiac Enzymes: No results for input(s): CKTOTAL, CKMB, CKMBINDEX, TROPONINI in the last 168 hours. BNP: Invalid input(s): POCBNP CBG: Recent Labs  Lab 11/04/20 0426 11/04/20 0719 11/04/20 1232 11/04/20 1701  GLUCAP 92 83 196* 83   D-Dimer No results for input(s): DDIMER in the last 72 hours. Hgb A1c No results for input(s): HGBA1C in the last 72 hours. Lipid Profile No results for input(s): CHOL, HDL, LDLCALC, TRIG, CHOLHDL, LDLDIRECT in the last 72 hours. Thyroid function studies No results for input(s): TSH, T4TOTAL, T3FREE, THYROIDAB in the last 72 hours.  Invalid input(s): FREET3 Anemia work up No results for input(s): VITAMINB12, FOLATE, FERRITIN, TIBC, IRON, RETICCTPCT in the last 72 hours. Urinalysis    Component Value Date/Time   COLORURINE YELLOW (A) 11/04/2020 0508   APPEARANCEUR HAZY (A) 11/04/2020 0508   LABSPEC 1.012 11/04/2020 0508   PHURINE 5.0 11/04/2020 0508   GLUCOSEU NEGATIVE 11/04/2020 0508   HGBUR  LARGE (A) 11/04/2020 0508   BILIRUBINUR NEGATIVE 11/04/2020 0508   KETONESUR NEGATIVE 11/04/2020 0508   PROTEINUR 30 (A) 11/04/2020 0508   NITRITE NEGATIVE 11/04/2020 0508   LEUKOCYTESUR NEGATIVE 11/04/2020 0508   Sepsis Labs Invalid input(s): PROCALCITONIN,  WBC,  LACTICIDVEN Microbiology Recent Results (from the past 240 hour(s))  Urine culture     Status: Abnormal   Collection Time: 11/04/20  1:52 AM   Specimen: Urine, Random  Result Value Ref Range Status   Specimen Description   Final    URINE, RANDOM Performed at [facility], [address], [place], [place] [address]    Special Requests   Final    NONE Performed at [facility], [address] [place], [place] [address]    Culture (A)  Final    <10,000 COLONIES/mL INSIGNIFICANT GROWTH Performed at [facility], [place] [address], [place], [place] [address]    Report Status 11/05/2020 FINAL  Final  SARS Coronavirus 2 by RT PCR (hospital order, performed in [facility] hospital lab) Nasopharyngeal Nasopharyngeal Swab     Status: None   Collection Time: 11/04/20  4:02 AM   Specimen: Nasopharyngeal Swab  Result Value Ref Range Status   SARS Coronavirus 2 NEGATIVE NEGATIVE Final    Comment: (NOTE) SARS-CoV-2 target nucleic acids are NOT DETECTED.  The SARS-CoV-2 RNA is generally detectable in upper and lower respiratory specimens during the acute phase of infection. The lowest concentration of SARS-CoV-2 viral copies this assay can detect is 250 copies / mL. A negative result does not preclude SARS-CoV-2 infection and should not be used as the sole basis for treatment or other patient management decisions.  A negative result may occur with improper specimen collection / handling, submission of specimen other than nasopharyngeal swab, presence of viral mutation(s) within the areas targeted by this assay, and inadequate number of viral copies (<250 copies / mL). A negative result must be combined  with clinical observations, patient history, and epidemiological information.  Fact Sheet for Patients:   StrictlyIdeas.no  Fact Sheet for Healthcare Providers: BankingDealers.co.za  This test is  not yet approved or  cleared by the [place] and has been authorized for detection and/or diagnosis of SARS-CoV-2 by FDA under an Emergency Use Authorization (EUA).  This EUA will remain in effect (meaning this test can be used) for the duration of the COVID-19 declaration under Section 564(b)(1) of the Act, 21 U.S.C. section 360bbb-3(b)(1), unless the authorization is terminated or revoked sooner.  Performed at [facility], [address] [place], [place] [address]      Time coordinating discharge: Over 30 minutes  SIGNED:   Wyvonnia Dusky, MD  [facility] 11/09/2020, 10:08 AM Pager   If 7PM-7AM, please contact night-coverage

## 2020-11-10 NOTE — Discharge Summary (Signed)
D/c summary was miss labeled as progress note on 11/09/20. Please see note for full d/c summary

## 2020-11-25 ENCOUNTER — Other Ambulatory Visit: Payer: Self-pay | Admitting: Surgery

## 2020-11-26 ENCOUNTER — Ambulatory Visit: Payer: Medicare Other | Admitting: Urology

## 2020-11-26 ENCOUNTER — Encounter: Payer: Self-pay | Admitting: Urology

## 2020-11-26 ENCOUNTER — Other Ambulatory Visit: Payer: Self-pay

## 2020-11-26 VITALS — BP 88/50 | HR 120 | Ht 67.0 in | Wt 159.6 lb

## 2020-11-26 DIAGNOSIS — N401 Enlarged prostate with lower urinary tract symptoms: Secondary | ICD-10-CM

## 2020-11-26 DIAGNOSIS — N138 Other obstructive and reflux uropathy: Secondary | ICD-10-CM

## 2020-11-26 DIAGNOSIS — Z466 Encounter for fitting and adjustment of urinary device: Secondary | ICD-10-CM

## 2020-11-26 LAB — BLADDER SCAN AMB NON-IMAGING

## 2020-11-26 MED ORDER — FINASTERIDE 5 MG PO TABS: 5.0000 mg | ORAL_TABLET | Freq: Every day | ORAL | 11 refills | Status: DC

## 2020-11-26 MED ORDER — SULFAMETHOXAZOLE-TRIMETHOPRIM 800-160 MG PO TABS
1.0000 | ORAL_TABLET | Freq: Once | ORAL | Status: AC
Start: 2020-11-26 — End: 2020-11-26
  Administered 2020-11-26: 1 via ORAL

## 2020-11-26 MED ORDER — ALFUZOSIN HCL ER 10 MG PO TB24: 10.0000 mg | ORAL_TABLET | Freq: Every day | ORAL | 3 refills | Status: DC

## 2020-11-26 MED ORDER — SULFAMETHOXAZOLE-TRIMETHOPRIM 800-160 MG PO TABS: 1.0000 | ORAL_TABLET | Freq: Two times a day (BID) | ORAL | 0 refills | Status: DC

## 2020-11-26 NOTE — Addendum Note (Signed)
Addended by: Donalee Citrin on: 11/26/2020 03:01 PM   Modules accepted: Orders

## 2020-11-26 NOTE — Progress Notes (Addendum)
11/26/20 9:04 AM   Cherly Augusta 11/21/1931 [id number]  CC: Urinary retention  HPI: I saw Mr. Hartzog in urology clinic today for evaluation of recent hospitalization for urinary retention.  He is a very comorbid and frail-appearing 87 year old male with past medical history notable for CAD with stent, COPD, type 2 diabetes, tobacco use who was recently hospitalized on 11/04/2020 for acute urinary retention with hyperkalemia and acute on chronic kidney injury.  Urology was not consulted at that admission.  He was discharged on 11/09/2020 with Foley in place.  CT was performed at that admission and showed no hydronephrosis or nephrolithiasis, but significant BPH with a prostate volume measuring 130 g.  He has a long history of BPH and urinary symptoms on doxazosin, and previously was followed by Dr. Tresa Moore.  He reports 2-3 prior episodes of retention.  His primary urinary symptoms are weak stream, urgency, frequency, and feeling of incomplete emptying  He is here today for voiding trial and bladder management options.  He has recently been seen by cardiology in anticipation of an upcoming shoulder surgery.  He lives alone.   PMH: Past Medical History:  Diagnosis Date  . Cancer ([place])   . Diabetes mellitus without complication [facility])     Surgical History: Past Surgical History:  Procedure Laterality Date  . CARDIAC CATHETERIZATION     has a stent  . FRACTURE SURGERY     per patient to have sholder surgery in March    Family History: Family History  Problem Relation Age of Onset  . Prostate cancer Neg Hx   . Bladder Cancer Neg Hx   . Kidney cancer Neg Hx     Social History:  reports that he has been smoking cigarettes. He has been smoking about 1.00 pack per day. He has never used smokeless tobacco. He reports current alcohol use. He reports that he does not use drugs.  Physical Exam: BP (!) 88/50 (BP Location: Left Arm, Patient Position: Sitting, Cuff Size: Normal)    Pulse (!) 120   Ht 5\' 7"  (1.702 m)   Wt 159 lb 9.6 oz (72.4 kg)   BMI 25.00 kg/m    Constitutional: Very hard of hearing, frail-appearing, disheveled Cardiovascular: No clubbing, cyanosis, or edema. Respiratory: Normal respiratory effort, no increased work of breathing. GI: Abdomen is soft, nontender, nondistended, no abdominal masses   Laboratory Data: Reviewed  Pertinent Imaging: I have personally viewed and interpreted the CT dated 11/04/2020 showing a moderately distended bladder, no hydronephrosis, and prostate measuring 130 g.  Assessment & Plan:   87 year old very comorbid and frail-appearing male with recent hospitalization for urinary retention requiring Foley catheter placement, with hyperkalemia and acute on chronic renal failure.  We reviewed bladder management options at length including chronic Foley, intermittent catheterization, or consideration of an outlet procedure.  He is amenable to a trial of void today with close follow-up this afternoon for a repeat postvoid residual to confirm emptying.  If he continues to not empty his bladder well, would need to replace Foley catheter and consider HOLEP in the future with his history of recurrent retention, as well as recent hospitalization for severe hyperkalemia and acute on chronic renal failure.  Bactrim prophylaxis given prior to Foley catheter removal.  We will plan for 3 days Bactrim DS twice daily to sterilize the urine in the setting of his likely incomplete emptying to decrease risk of infection.  He was able to void twice today, and returns this afternoon with a PVR  of only 8 mL.  Return precautions discussed extensively.  Maximal medical therapy with alfuzosin and finasteride for BPH RTC 2 to 3 months with IPSS/PVR  I spent 70 total minutes on the day of the encounter including pre-visit review of the medical record, face-to-face time with the patient, and post visit ordering of labs/imaging/tests.  Nickolas Madrid,  MD 11/26/2020  [facility] [address] [place], [place] [address] [phone]

## 2020-11-26 NOTE — Progress Notes (Signed)
Fill and Pull Catheter Removal  Patient is present today for a catheter removal.  Patient was cleaned and prepped in a sterile fashion 37ml of sterile water/ saline was instilled into the bladder when the patient began to have a bladder spasm. No further instillation was attempted. 91ml of water was then drained from the balloon. A 16FR foley cath was removed from the bladder no complications were noted. Patient was given 1 dose of Bactrim DS per Dr. Doristine Counter orders. Patient was instructed to go home and push fluids, RTC at 3pm. Patient gave verbal understanding.  Patient tolerated well.  Performed by: Gordy Clement, CMA   Follow up/ Additional notes: RTC this afternoon for PVR

## 2020-11-27 ENCOUNTER — Emergency Department
Admission: EM | Admit: 2020-11-27 | Discharge: 2020-11-27 | Disposition: A | Discharge status: Home / Self Care | Payer: Medicare Other | Attending: Emergency Medicine | Admitting: Emergency Medicine

## 2020-11-27 ENCOUNTER — Other Ambulatory Visit: Payer: Self-pay

## 2020-11-27 DIAGNOSIS — Z859 Personal history of malignant neoplasm, unspecified: Secondary | ICD-10-CM | POA: Diagnosis not present

## 2020-11-27 DIAGNOSIS — Z79899 Other long term (current) drug therapy: Secondary | ICD-10-CM | POA: Diagnosis not present

## 2020-11-27 DIAGNOSIS — I129 Hypertensive chronic kidney disease with stage 1 through stage 4 chronic kidney disease, or unspecified chronic kidney disease: Secondary | ICD-10-CM | POA: Diagnosis not present

## 2020-11-27 DIAGNOSIS — N390 Urinary tract infection, site not specified: Secondary | ICD-10-CM

## 2020-11-27 DIAGNOSIS — I2510 Atherosclerotic heart disease of native coronary artery without angina pectoris: Secondary | ICD-10-CM | POA: Insufficient documentation

## 2020-11-27 DIAGNOSIS — Y846 Urinary catheterization as the cause of abnormal reaction of the patient, or of later complication, without mention of misadventure at the time of the procedure: Secondary | ICD-10-CM | POA: Insufficient documentation

## 2020-11-27 DIAGNOSIS — N1832 Chronic kidney disease, stage 3b: Secondary | ICD-10-CM | POA: Insufficient documentation

## 2020-11-27 DIAGNOSIS — E1169 Type 2 diabetes mellitus with other specified complication: Secondary | ICD-10-CM | POA: Insufficient documentation

## 2020-11-27 DIAGNOSIS — T83511A Infection and inflammatory reaction due to indwelling urethral catheter, initial encounter: Secondary | ICD-10-CM | POA: Diagnosis present

## 2020-11-27 DIAGNOSIS — Z7951 Long term (current) use of inhaled steroids: Secondary | ICD-10-CM | POA: Diagnosis not present

## 2020-11-27 DIAGNOSIS — Z7982 Long term (current) use of aspirin: Secondary | ICD-10-CM | POA: Insufficient documentation

## 2020-11-27 DIAGNOSIS — E785 Hyperlipidemia, unspecified: Secondary | ICD-10-CM | POA: Diagnosis not present

## 2020-11-27 DIAGNOSIS — N3001 Acute cystitis with hematuria: Secondary | ICD-10-CM | POA: Diagnosis not present

## 2020-11-27 DIAGNOSIS — F17210 Nicotine dependence, cigarettes, uncomplicated: Secondary | ICD-10-CM | POA: Insufficient documentation

## 2020-11-27 DIAGNOSIS — R339 Retention of urine, unspecified: Secondary | ICD-10-CM | POA: Insufficient documentation

## 2020-11-27 DIAGNOSIS — J449 Chronic obstructive pulmonary disease, unspecified: Secondary | ICD-10-CM | POA: Diagnosis not present

## 2020-11-27 LAB — CBC WITH DIFFERENTIAL/PLATELET
Abs Immature Granulocytes: 0.06 10*3/uL (ref 0.00–0.07)
Basophils Absolute: 0.1 10*3/uL (ref 0.0–0.1)
Basophils Relative: 0 %
Eosinophils Absolute: 0.1 10*3/uL (ref 0.0–0.5)
Eosinophils Relative: 1 %
HCT: 36.8 % — ABNORMAL LOW (ref 39.0–52.0)
Hemoglobin: 12.9 g/dL — ABNORMAL LOW (ref 13.0–17.0)
Immature Granulocytes: 1 %
Lymphocytes Relative: 10 %
Lymphs Abs: 1.2 10*3/uL (ref 0.7–4.0)
MCH: 31 pg (ref 26.0–34.0)
MCHC: 35.1 g/dL (ref 30.0–36.0)
MCV: 88.5 fL (ref 80.0–100.0)
Monocytes Absolute: 0.9 10*3/uL (ref 0.1–1.0)
Monocytes Relative: 8 %
Neutro Abs: 9.6 10*3/uL — ABNORMAL HIGH (ref 1.7–7.7)
Neutrophils Relative %: 80 %
Platelets: 223 10*3/uL (ref 150–400)
RBC: 4.16 MIL/uL — ABNORMAL LOW (ref 4.22–5.81)
RDW: 14.3 % (ref 11.5–15.5)
WBC: 12 10*3/uL — ABNORMAL HIGH (ref 4.0–10.5)
nRBC: 0 % (ref 0.0–0.2)

## 2020-11-27 LAB — URINALYSIS, COMPLETE (UACMP) WITH MICROSCOPIC
Bilirubin Urine: NEGATIVE
Glucose, UA: NEGATIVE mg/dL
Hgb urine dipstick: NEGATIVE
Ketones, ur: NEGATIVE mg/dL
Nitrite: POSITIVE — AB
Protein, ur: 100 mg/dL — AB
Specific Gravity, Urine: 1.016 (ref 1.005–1.030)
Squamous Epithelial / HPF: NONE SEEN (ref 0–5)
WBC, UA: >50 WBC/hpf — ABNORMAL HIGH (ref 0–5)
pH: 5 (ref 5.0–8.0)

## 2020-11-27 LAB — BASIC METABOLIC PANEL
Anion gap: 9 (ref 5–15)
BUN: 49 mg/dL — ABNORMAL HIGH (ref 8–23)
CO2: 22 mmol/L (ref 22–32)
Calcium: 8.5 mg/dL — ABNORMAL LOW (ref 8.9–10.3)
Chloride: 108 mmol/L (ref 98–111)
Creatinine, Ser: 2.26 mg/dL — ABNORMAL HIGH (ref 0.61–1.24)
GFR, Estimated: 28 mL/min — ABNORMAL LOW (ref >60)
Glucose, Bld: 117 mg/dL — ABNORMAL HIGH (ref 70–99)
Potassium: 4.4 mmol/L (ref 3.5–5.1)
Sodium: 139 mmol/L (ref 135–145)

## 2020-11-27 MED ORDER — SULFAMETHOXAZOLE-TRIMETHOPRIM 800-160 MG PO TABS: 1.0000 | ORAL_TABLET | Freq: Two times a day (BID) | ORAL | 0 refills | Status: AC

## 2020-11-27 MED ORDER — SODIUM CHLORIDE 0.9 % IV SOLN
1.0000 g | Freq: Once | INTRAVENOUS | Status: AC
  Administered 2020-11-27: 1 g via INTRAVENOUS
  Filled 2020-11-27: qty 10

## 2020-11-27 MED ORDER — LACTATED RINGERS IV BOLUS
1000.0000 mL | Freq: Once | INTRAVENOUS | Status: AC
  Administered 2020-11-27: 1000 mL via INTRAVENOUS

## 2020-11-27 NOTE — ED Notes (Signed)
Standard urinary drainage bag changed to urinary leg drainage bag prior to d/c

## 2020-11-27 NOTE — Discharge Instructions (Signed)
You have a urinary tract infection. Make sure to take the antibiotic Bactrim that I prescribed to you twice a day for 10 days. Call Dr. Doristine Counter office today for a close follow up appointment within the next week. Make sure to follow the instructions below for the catheter care. Return to the ER for abdominal pain, back pain, vomiting, or fever.

## 2020-11-27 NOTE — ED Provider Notes (Signed)
[facility] Emergency Department Provider Note  ____________________________________________  Time seen: Approximately 5:49 AM  I have reviewed the triage vital signs and the nursing notes.   HISTORY  Chief Complaint Urinary Retention   HPI George [name] is a 87 y.o. male with a history of BPH, urinary retention, diabetes, hypertension, COPD who presents for evaluation of urinary retention. Patient presented to the hospital  with urinary retention causing acute renal injury and hyperkalemia in the end of January. Had a Foley catheter placed. He was seen by urology yesterday and had a trial of void which he passed after the catheter was removed. He returned later that evening for repeat trial of void and bladder scan which showed 8 cc of urine. Patient reports that since leaving the urologist office he has been unable to urinate. He is complaining of severe suprapubic pressure and sharp pain. Has been unable to sleep due to the severity of the pain. No nausea or vomiting, no fever or chills.  Past Medical History:  Diagnosis Date  . Cancer ([place])   . Diabetes mellitus without complication [facility])     Patient Active Problem List   Diagnosis Date Noted  . Acute kidney injury superimposed on CKD IIIb ([place]) 11/04/2020  . BPH with obstruction/lower urinary tract symptoms 11/04/2020  . Hyperkalemia 11/04/2020  . Acute urinary retention 11/04/2020  . Community acquired pneumonia 06/25/2019  . Pneumonia 06/25/2019  . Foot pain 07/11/2018  . Urinary urgency 06/27/2017  . Erectile dysfunction 06/27/2017  . Stage 3 acute kidney injury ([place]) 06/27/2017  . Hypogonadism in male 06/27/2017  . Vomiting 02/09/2016  . Slurred speech 02/09/2016  . Nausea and vomiting 02/09/2016  . Lower abdominal pain 02/09/2016  . Tobacco abuse 02/09/2016  . Essential hypertension 02/09/2016  . GERD (gastroesophageal reflux disease) 02/09/2016  . Hyperlipidemia 04/22/2014  .  Hypertension 04/22/2014  . COPD (chronic obstructive pulmonary disease) ([place]) 04/22/2014  . Diabetes mellitus type 2, uncomplicated ([place]) 74/25/9563  . CAD (coronary artery disease) 09/10/1998    Past Surgical History:  Procedure Laterality Date  . CARDIAC CATHETERIZATION     has a stent  . FRACTURE SURGERY     per patient to have sholder surgery in March    Prior to Admission medications   Medication Sig Start Date End Date Taking? Authorizing Provider  sulfamethoxazole-trimethoprim (BACTRIM DS) 800-160 MG tablet Take 1 tablet by mouth 2 (two) times daily for 10 days. 11/27/20 12/07/20 Yes Alfred Levins, [place], MD  alfuzosin (UROXATRAL) 10 MG 24 hr tablet Take 1 tablet (10 mg total) by mouth daily with breakfast. This replaces doxazosin 11/26/20   [name], MD  aspirin 81 MG EC tablet Chew 81 mg by mouth daily.    [provider]  cyanocobalamin (,VITAMIN B-12,) 1000 MCG/ML injection Inject 1,000 mcg into the muscle every 30 (thirty) days.    [provider]  finasteride (PROSCAR) 5 MG tablet Take 1 tablet (5 mg total) by mouth daily. 11/26/20 11/21/21  [name], MD  Fluticasone-Salmeterol (ADVAIR) 250-50 MCG/DOSE AEPB Inhale 1 puff into the lungs 2 (two) times daily.    [provider]  lisinopril (PRINIVIL,ZESTRIL) 20 MG tablet Take 20 mg by mouth 2 (two) times daily.    [provider]  omeprazole (PRILOSEC) 20 MG capsule Take 20 mg by mouth daily.    [provider]  oxybutynin (DITROPAN) 5 MG tablet Take 1 tablet (5 mg total) by mouth 3 (three) times daily for 21  days. Take this medication only while you have foley catheter 11/09/20 11/30/20  Wyvonnia Dusky, MD  sildenafil (REVATIO) 20 MG tablet Take 40-100 mg by mouth as needed (for ED).     [provider]  simvastatin (ZOCOR) 20 MG tablet Take 10 mg by mouth at bedtime.    [provider]  testosterone cypionate (DEPOTESTOSTERONE CYPIONATE) 200 MG/ML  injection Inject 200 mg into the muscle every 14 (fourteen) days.    [provider]  verapamil (CALAN-SR) 240 MG CR tablet Take 240 mg by mouth 2 (two) times daily.    [provider]    Allergies Patient has no known allergies.  Family History  Problem Relation Age of Onset  . Prostate cancer Neg Hx   . Bladder Cancer Neg Hx   . Kidney cancer Neg Hx     Social History Social History   Tobacco Use  . Smoking status: Current Every Day Smoker    Packs/day: 1.00    Types: Cigarettes  . Smokeless tobacco: Never Used  . Tobacco comment: per patient he quit about a year ago and then started back 4 months aog, and quite again 2 weeks ago  Substance Use Topics  . Alcohol use: Yes    Comment: occ  . Drug use: No    Review of Systems  Constitutional: Negative for fever. Eyes: Negative for visual changes. ENT: Negative for sore throat. Neck: No neck pain  Cardiovascular: Negative for chest pain. Respiratory: Negative for shortness of breath. Gastrointestinal: Negative for abdominal pain, vomiting or diarrhea. Genitourinary: Negative for dysuria. + urinary retention Musculoskeletal: Negative for back pain. Skin: Negative for rash. Neurological: Negative for headaches, weakness or numbness. Psych: No SI or HI  ____________________________________________   PHYSICAL EXAM:  VITAL SIGNS: ED Triage Vitals  Enc Vitals Group     BP 11/27/20 0452 124/72     Pulse Rate 11/27/20 0452 (!) 113     Resp 11/27/20 0452 (!) 22     Temp 11/27/20 0452 98.3 F (36.8 C)     Temp Source 11/27/20 0452 Oral     SpO2 11/27/20 0452 97 %     Weight 11/27/20 0450 160 lb (72.6 kg)     Height 11/27/20 0450 5\' 11"  (1.803 m)     Head Circumference --      Peak Flow --      Pain Score 11/27/20 0450 10     Pain Loc --      Pain Edu? --      Excl. in [place]? --     Constitutional: Alert and oriented, looks very uncomfortable due to pain.  HEENT:      Head: Normocephalic and  atraumatic.         Eyes: Conjunctivae are normal. Sclera is non-icteric.       Mouth/Throat: Mucous membranes are moist.       Neck: Supple with no signs of meningismus. Cardiovascular: Tachycardic with regular rhythm  respiratory: Normal respiratory effort. Lungs are clear to auscultation bilaterally. No wheezes, crackles, or rhonchi.  Gastrointestinal: Soft, tender to palpation the suprapubic region, and non distended with positive bowel sounds. No rebound or guarding. Genitourinary: No CVA tenderness. Musculoskeletal:  No edema, cyanosis, or erythema of extremities. Neurologic: Normal speech and language. Face is symmetric. Moving all extremities. No gross focal neurologic deficits are appreciated. Skin: Skin is warm, dry and intact. No rash noted. Psychiatric: Mood and affect are normal. Speech and behavior are normal.  ____________________________________________  LABS (all labs ordered are listed, but only abnormal results are displayed)  Labs Reviewed  CBC WITH DIFFERENTIAL/PLATELET - Abnormal; Notable for the following components:      Result Value   WBC 12.0 (*)    RBC 4.16 (*)    Hemoglobin 12.9 (*)    HCT 36.8 (*)    Neutro Abs 9.6 (*)    All other components within normal limits  BASIC METABOLIC PANEL - Abnormal; Notable for the following components:   Glucose, Bld 117 (*)    BUN 49 (*)    Creatinine, Ser 2.26 (*)    Calcium 8.5 (*)    GFR, Estimated 28 (*)    All other components within normal limits  URINALYSIS, COMPLETE (UACMP) WITH MICROSCOPIC - Abnormal; Notable for the following components:   Color, Urine YELLOW (*)    APPearance CLOUDY (*)    Protein, ur 100 (*)    Nitrite POSITIVE (*)    Leukocytes,Ua MODERATE (*)    WBC, UA >50 (*)    Bacteria, UA MANY (*)    All other components within normal limits  URINE CULTURE   ____________________________________________  EKG  none  ____________________________________________  RADIOLOGY  none   ____________________________________________   PROCEDURES  Procedure(s) performed: None Procedures Critical Care performed:  None ____________________________________________   INITIAL IMPRESSION / ASSESSMENT AND PLAN / ED COURSE   87 y.o. male with a history of BPH, urinary retention, diabetes, hypertension, COPD who presents for evaluation of urinary retention after having his Foley catheter removed yesterday. The Foley catheter was on since January 25 when he presented to the ED with urinary retention. Review of note from urologist office from yesterday shows that patient passed trial of void and also able to urinate again in the end of the day when he represented to the clinic for repeat trial of void and bladder scan which showed only 8 cc. Has been unable to void since then. Patient arrives complaining of severe pain and looks very uncomfortable. Bladder scan showing 282 cc. A coud 16 French Foley catheter was placed without any difficulty with immediate resolution of patient's pain. Patient had 300 cc urine output in the Foley catheter. UA is positive for urinary tract infection. Will give a dose of IV Rocephin. Patient has been placed on Bactrim by urologist which I will recommend that he continues to take it at home. We'll check basic blood work to rule out sepsis, acute kidney injury. Patient is already on Flomax.   _________________________ 6:46 AM on 11/27/2020 ----------------------------------------- Labs showing no significant changes in patient's creatinine which has been between 2.2 and 1.8.  Currently is 2.26.  Patient received a liter bolus.  His tachycardia subsided immediately once Foley was placed and patient was no longer in pain.  At this time I do not believe patient is septic.  Patient remains afebrile with no further episodes of pain here.  Foley is draining.  Will discharge home on Bactrim and close follow-up with urologist.  Discussed my standard return precautions  for any signs of fever, abdominal pain, flank pain, nausea or vomiting recommended return to the emergency room if these develop.  I spoke with Dr. Diamantina Providence from Urology who will make sure to have a close follow up for him as an outpatient.        _____________________________________________ Please note:  Patient was evaluated in Emergency Department today for the symptoms described in the history of present illness. Patient was evaluated in the context of the  global COVID-19 pandemic, which necessitated consideration that the patient might be at risk for infection with the SARS-CoV-2 virus that causes COVID-19. Institutional protocols and algorithms that pertain to the evaluation of patients at risk for COVID-19 are in a state of rapid change based on information released by regulatory bodies including the CDC and federal and state organizations. These policies and algorithms were followed during the patient's care in the ED.  Some ED evaluations and interventions may be delayed as a result of limited staffing during the pandemic.   [place] Controlled Substance Database was reviewed by me. ____________________________________________   FINAL CLINICAL IMPRESSION(S) / ED DIAGNOSES   Final diagnoses:  Urinary retention  Acute cystitis with hematuria  Urinary tract infection associated with catheterization of urinary tract, unspecified indwelling urinary catheter type, initial encounter ([place])      NEW MEDICATIONS STARTED DURING THIS VISIT:  ED Discharge Orders         Ordered    sulfamethoxazole-trimethoprim (BACTRIM DS) 800-160 MG tablet  2 times daily        11/27/20 2761           Note:  This document was prepared using Dragon voice recognition software and may include unintentional dictation errors.    Alfred Levins, [place], MD 11/27/20 [phone]

## 2020-11-27 NOTE — ED Triage Notes (Signed)
Pt states he had catheter removed yesterday and has been unable to urinate since, pt states he is having pain in his groin area. Pt states he has the feeling on needing to urinate but is unable to. Pt states catheter was originally placed due to "a swollen something"

## 2020-11-28 ENCOUNTER — Telehealth: Payer: Self-pay | Admitting: Urology

## 2020-11-28 NOTE — Telephone Encounter (Signed)
Pt doesn't have a v/m set up.  I will c/b on Monday.

## 2020-11-28 NOTE — Telephone Encounter (Signed)
-----   Message from [name], MD sent at 11/27/2020  8:28 AM EST ----- Regarding: follow up Patient is very hard of hearing.  Please schedule foley removal in ~10day, AM visit with PA, PM visit with me for PVR, thanks  Nickolas Madrid, MD 11/27/2020

## 2020-11-29 LAB — URINE CULTURE: Culture: >=100000 — AB

## 2020-11-30 NOTE — Progress Notes (Signed)
ED Antimicrobial Stewardship Positive Culture Follow Up   George [name] is an 87 y.o. male who presented to [facility] on 11/27/2020 with a chief complaint of urinary retention. Pt has chronic foley and follows with urology. Pt with ESBL E coli could be colonization from chronic foley.    Chief Complaint  Patient presents with  . Urinary Retention    Recent Results (from the past 720 hour(s))  Urine culture     Status: Abnormal   Collection Time: 11/04/20  1:52 AM   Specimen: Urine, Random  Result Value Ref Range Status   Specimen Description   Final    URINE, RANDOM Performed at [facility], [address] [place], [place] [address]    Special Requests   Final    NONE Performed at [facility], [address], [place], [place] [address]    Culture (A)  Final    <10,000 COLONIES/mL INSIGNIFICANT GROWTH Performed at [facility] [address] [place], [place] [address]    Report Status 11/05/2020 FINAL  Final  SARS Coronavirus 2 by RT PCR (hospital order, performed in [facility] hospital lab) Nasopharyngeal Nasopharyngeal Swab     Status: None   Collection Time: 11/04/20  4:02 AM   Specimen: Nasopharyngeal Swab  Result Value Ref Range Status   SARS Coronavirus 2 NEGATIVE NEGATIVE Final    Comment: (NOTE) SARS-CoV-2 target nucleic acids are NOT DETECTED.  The SARS-CoV-2 RNA is generally detectable in upper and lower respiratory specimens during the acute phase of infection. The lowest concentration of SARS-CoV-2 viral copies this assay can detect is 250 copies / mL. A negative result does not preclude SARS-CoV-2 infection and should not be used as the sole basis for treatment or other patient management decisions.  A negative result may occur with improper specimen collection / handling, submission of specimen other than nasopharyngeal swab, presence of viral mutation(s) within the areas targeted by this assay, and inadequate number of viral  copies (<250 copies / mL). A negative result must be combined with clinical observations, patient history, and epidemiological information.  Fact Sheet for Patients:   StrictlyIdeas.no  Fact Sheet for Healthcare Providers: BankingDealers.co.za  This test is not yet approved or  cleared by the [place] FDA and has been authorized for detection and/or diagnosis of SARS-CoV-2 by FDA under an Emergency Use Authorization (EUA).  This EUA will remain in effect (meaning this test can be used) for the duration of the COVID-19 declaration under Section 564(b)(1) of the Act, 21 U.S.C. section 360bbb-3(b)(1), unless the authorization is terminated or revoked sooner.  Performed at [facility], [address], [place], [place] [address]   Urine Culture     Status: Abnormal   Collection Time: 11/27/20  5:08 AM   Specimen: Urine, Random  Result Value Ref Range Status   Specimen Description   Final    URINE, RANDOM Performed at [facility], [address] [place], [place] [address]    Special Requests   Final    NONE Performed at [facility], [address] [place], [place] [address]    Culture (A)  Final    >=100,000 COLONIES/mL ESCHERICHIA COLI Confirmed Extended Spectrum Beta-Lactamase Producer (ESBL).  In bloodstream infections from ESBL organisms, carbapenems are preferred over piperacillin/tazobactam. They are shown to have a lower risk of mortality.    Report Status 11/29/2020 FINAL  Final   Organism ID, Bacteria ESCHERICHIA COLI (A)  Final  Susceptibility   Escherichia coli - MIC*    AMPICILLIN >=32 RESISTANT Resistant     CEFAZOLIN >=64 RESISTANT Resistant     CEFEPIME 16 RESISTANT Resistant     CEFTRIAXONE >=64 RESISTANT Resistant     CIPROFLOXACIN >=4 RESISTANT Resistant     GENTAMICIN <=1 SENSITIVE Sensitive     IMIPENEM <=0.25 SENSITIVE Sensitive     NITROFURANTOIN <=16 SENSITIVE  Sensitive     TRIMETH/SULFA >=320 RESISTANT Resistant     AMPICILLIN/SULBACTAM 4 SENSITIVE Sensitive     PIP/TAZO <=4 SENSITIVE Sensitive     * >=100,000 COLONIES/mL ESCHERICHIA COLI    [x]  Treated with Bactrim, organism resistant to prescribed antimicrobial  New antibiotic prescription: Fosfomycin 3g PO x1  ED Provider: Lynden Oxford, PharmD Pharmacy Resident  11/30/2020 10:13 AM

## 2020-12-02 ENCOUNTER — Encounter: Payer: Self-pay | Admitting: Urology

## 2020-12-02 ENCOUNTER — Other Ambulatory Visit: Payer: Self-pay

## 2020-12-02 ENCOUNTER — Ambulatory Visit (INDEPENDENT_AMBULATORY_CARE_PROVIDER_SITE_OTHER): Payer: Medicare Other | Admitting: Urology

## 2020-12-02 ENCOUNTER — Other Ambulatory Visit: Payer: Medicare Other

## 2020-12-02 ENCOUNTER — Telehealth: Payer: Self-pay

## 2020-12-02 VITALS — BP 92/57 | HR 103 | Temp 98.0°F | Ht 71.0 in | Wt 160.0 lb

## 2020-12-02 DIAGNOSIS — Z466 Encounter for fitting and adjustment of urinary device: Secondary | ICD-10-CM | POA: Diagnosis not present

## 2020-12-02 DIAGNOSIS — R972 Elevated prostate specific antigen [PSA]: Secondary | ICD-10-CM

## 2020-12-02 DIAGNOSIS — R339 Retention of urine, unspecified: Secondary | ICD-10-CM | POA: Diagnosis not present

## 2020-12-02 DIAGNOSIS — N401 Enlarged prostate with lower urinary tract symptoms: Secondary | ICD-10-CM

## 2020-12-02 DIAGNOSIS — N138 Other obstructive and reflux uropathy: Secondary | ICD-10-CM

## 2020-12-02 DIAGNOSIS — T839XXA Unspecified complication of genitourinary prosthetic device, implant and graft, initial encounter: Secondary | ICD-10-CM

## 2020-12-02 MED ORDER — NITROFURANTOIN MONOHYD MACRO 100 MG PO CAPS: 100.0000 mg | ORAL_CAPSULE | Freq: Two times a day (BID) | ORAL | 0 refills | Status: DC

## 2020-12-02 MED ORDER — ALFUZOSIN HCL ER 10 MG PO TB24: 10.0000 mg | ORAL_TABLET | Freq: Every day | ORAL | 3 refills | Status: DC

## 2020-12-02 MED ORDER — HYOSCYAMINE SULFATE 0.125 MG PO TABS: 0.1250 mg | ORAL_TABLET | ORAL | 0 refills | Status: DC | PRN

## 2020-12-02 MED ORDER — FINASTERIDE 5 MG PO TABS: 5.0000 mg | ORAL_TABLET | Freq: Every day | ORAL | 11 refills | Status: DC

## 2020-12-02 NOTE — Progress Notes (Signed)
12/02/2020 10:20 PM   George [name] Apr 21, 1932 [id number]  Referring provider: Juluis Pitch, MD [phone] [address] [place],  [place] [address]  Chief Complaint  Patient presents with  . Urinary Retention   Urological history: 1. ED - no longer sexually active  2. BPH with LU TS - prostate volume 180 cc on 10/2020 CT Renal stone study  - managed with alfuzosin 10 mg daily and finasteride 5 mg daily  - now has catheter  3. CKD - serum creatinine 2.26  4. Testosterone deficiency - receiving testosterone cypionate - managed by PCP   5. Renal cysts - CT Renal stone study 10/2020 - 2 simple cysts are seen within the interpolar region of the left kidney slightly enlarged since prior examination measuring up to 2.6 cm in greatest dimension.  6. Elevated PSA - PSA Trend by PCP Related to PSA, Total (Diagnostic) Component 11/18/20 12/27/17 06/01/17 12/08/16 05/20/15 04/29/14  PSA (Prostate Specific Antigen), Total 7.13 3.71 3.27 3.60 3.34 3.84   PSA on the 8th of February was drawn with catheter in place     HPI: George [name] is a 87 y.o. male who presents today for catheter pain with his granddaughter, George [name].    Patient had a trial of void on 11/26/2020 with a 8 cc PVR on bladder scan upon return.  He sought treatment in the ED the next day after not being able to urinate.  86fr coude catheter was placed with a return of 300 cc.  We received a call from the caregiver, as patient is hard of hearing, this morning with a complaint that the patient had been awake and in pain since 4 AM.  He had been given AZO with no relief.  The caregiver also states that he must be having bladder spasms as urine is coming out from around the penis and soaking his bed.    George [name] states the home health nurse came out yesterday and put water in the balloon because of the spasms.  He symptoms then became worse.    The medication for spasms is also causing dry mouth.   As a side note,  patient was receiving testosterone cypionate through his PCP.  His PSA was found to be 7.13 on November 18, 2020.  This is an increase from 3.71 in 2019, but patient had a Foley catheter in place when the PSA was drawn.  The PCP has since held the testosterone cypionate and is waiting for approval to restart the medication.  His history is obtained from his granddaughter as he is very hard of hearing.    PMH: Past Medical History:  Diagnosis Date  . Cancer ([place])   . Diabetes mellitus without complication [facility])     Surgical History: Past Surgical History:  Procedure Laterality Date  . CARDIAC CATHETERIZATION     has a stent  . FRACTURE SURGERY     per patient to have sholder surgery in March    Home Medications:  Allergies as of 12/02/2020   No Known Allergies     Medication List       Accurate as of December 02, 2020 10:20 PM. If you have any questions, ask your nurse or doctor.        alfuzosin 10 MG 24 hr tablet Commonly known as: UROXATRAL Take 1 tablet (10 mg total) by mouth daily with breakfast. This replaces doxazosin   aspirin 81 MG EC tablet Chew 81 mg by mouth daily.   cyanocobalamin  1000 MCG/ML injection Commonly known as: (VITAMIN B-12) Inject 1,000 mcg into the muscle every 30 (thirty) days.   finasteride 5 MG tablet Commonly known as: PROSCAR Take 1 tablet (5 mg total) by mouth daily.   Fluticasone-Salmeterol 250-50 MCG/DOSE Aepb Commonly known as: ADVAIR Inhale 1 puff into the lungs 2 (two) times daily.   hyoscyamine 0.125 MG tablet Commonly known as: Levsin Take 1 tablet (0.125 mg total) by mouth every 4 (four) hours as needed. Started by: George Council, PA-C   lisinopril 20 MG tablet Commonly known as: ZESTRIL Take 20 mg by mouth 2 (two) times daily.   nitrofurantoin (macrocrystal-monohydrate) 100 MG capsule Commonly known as: MACROBID Take 1 capsule (100 mg total) by mouth every 12 (twelve) hours. Started by: George Council, PA-C    omeprazole 20 MG capsule Commonly known as: PRILOSEC Take 20 mg by mouth daily.   sildenafil 20 MG tablet Commonly known as: REVATIO Take 40-100 mg by mouth as needed (for ED).   simvastatin 20 MG tablet Commonly known as: ZOCOR Take 10 mg by mouth at bedtime.   sulfamethoxazole-trimethoprim 800-160 MG tablet Commonly known as: BACTRIM DS Take 1 tablet by mouth 2 (two) times daily for 10 days.   testosterone cypionate 200 MG/ML injection Commonly known as: DEPOTESTOSTERONE CYPIONATE Inject 200 mg into the muscle every 14 (fourteen) days.   verapamil 240 MG CR tablet Commonly known as: CALAN-SR Take 240 mg by mouth 2 (two) times daily.       Allergies: No Known Allergies  Family History: Family History  Problem Relation Age of Onset  . Prostate cancer Neg Hx   . Bladder Cancer Neg Hx   . Kidney cancer Neg Hx     Social History:  reports that he has been smoking cigarettes. He has been smoking about 1.00 pack per day. He has never used smokeless tobacco. He reports current alcohol use. He reports that he does not use drugs.  ROS: Pertinent ROS in HPI  Physical Exam: BP (!) 92/57   Pulse (!) 103   Temp 98 F (36.7 C) (Oral)   Ht 5\' 11"  (1.803 m)   Wt 160 lb (72.6 kg)   BMI 22.32 kg/m   Constitutional:  Well nourished. Alert and oriented, No acute distress. HEENT: George [name] AT, mask in place.  Trachea midline Cardiovascular: No clubbing, cyanosis, or edema. Respiratory: Normal respiratory effort, no increased work of breathing. GU: No CVA tenderness.  No bladder fullness or masses.  Patient with uncircumcised phallus. Foreskin easily retracted  Foley is in place.  No penile discharge. No penile lesions or rashes. Scrotum without lesions, cysts, rashes and/or edema.   Neurologic: Grossly intact, no focal deficits, moving all 4 extremities. Psychiatric: Normal mood and affect.  Laboratory Data: Lab Results  Component Value Date   WBC 12.0 (H) 11/27/2020   HGB 12.9  (L) 11/27/2020   HCT 36.8 (L) 11/27/2020   MCV 88.5 11/27/2020   PLT 223 11/27/2020    Lab Results  Component Value Date   CREATININE 2.26 (H) 11/27/2020    Lab Results  Component Value Date   HGBA1C 5.6 11/04/2020       Component Value Date/Time   CHOL 89 02/09/2016 0121   HDL 30 (L) 02/09/2016 0121   CHOLHDL 3.0 02/09/2016 0121   VLDL 22 02/09/2016 0121   LDLCALC 37 02/09/2016 0121    Lab Results  Component Value Date   AST 19 02/08/2016   Lab Results  Component Value Date  ALT 16 (L) 02/08/2016    Urinalysis    Component Value Date/Time   COLORURINE YELLOW (A) 11/27/2020 0508   APPEARANCEUR CLOUDY (A) 11/27/2020 0508   LABSPEC 1.016 11/27/2020 0508   PHURINE 5.0 11/27/2020 0508   GLUCOSEU NEGATIVE 11/27/2020 0508   HGBUR NEGATIVE 11/27/2020 0508   BILIRUBINUR NEGATIVE 11/27/2020 0508   KETONESUR NEGATIVE 11/27/2020 0508   PROTEINUR 100 (A) 11/27/2020 0508   NITRITE POSITIVE (A) 11/27/2020 0508   LEUKOCYTESUR MODERATE (A) 11/27/2020 0508    I have reviewed the labs.   Pertinent Imaging: No recent imaging since last visit   Cath Change/ Replacement  I started by letter down the balloon on the catheter and repositioning.  He continued to experience discomfort, so I attempted to irrigate the catheter.  I was unsuccessful, so the decision was made to exchange the catheter.    15 ml of water was removed from the balloon, a 16 FR coude foley cath was removed and it was noted that the curve was facing downward.  Patient was cleaned and prepped in a sterile fashion with betadine. A 16 FR two-way foley cath was replaced into the bladder no complications were noted.   Urine return was noted 50 ml and urine was orange in color. The balloon was filled with 13ml of sterile water. A leg bag was attached for drainage.  A night bag was also given to the patient and patient was given instruction on how to change from one bag to another. Patient was given proper  instruction on catheter care.    Patient stated he no longer felt anymore pain once the catheter was exchanged.   Performed by: Myself and Kyra Manges, CMA   Assessment & Plan:    1. Catheter issues - explained that either the catheter balloon was inflated in the prostatic urethra or the Coude catheter was upside down that was causing his discomfort and spasms - he was given Levsin 0.125 mg q 4 prn for spasms to take in place of the oxybutynin  - patient given Macrobid 100 mg BID x 7 days as preventative measures due to catheter manipulation, irrigation and upcoming voiding trial   2. Urinary retention -Patient continue alfuzosin 10 mg daily and finasteride 5 mg daily - new prescriptions given as patient and granddaughter stated he wasn't taking these -Patient has a voiding trial appointment with Dr. Diamantina Providence on March 1 and he is advised to keep this appointment  3. Elevated PSA -Recent increase likely due to UTI, retention and recent catheter placement -Patient is now on finasteride 5 mg daily and has a follow-up appointment with Dr. Diamantina Providence in 3 months -I have advised the patient not to restart testosterone cypionate until he follows up with Dr. Diamantina Providence and his PSA is repeated and is returned to baseline as testosterone cypionate given in the presence of prostate cancer can stimulate cancer progression, although it is more likely that the increase in the PSA is due to recent events and not prostate cancer, my recommendations still stand  4. Testosterone deficiency - therapy on hold at this time   Return for Return as scheduled on 12/09/2020 for TOV .  These notes generated with voice recognition software. I apologize for typographical errors.  George Council, PA-C  [facility] [address]  [place], [place] [address] [phone]

## 2020-12-02 NOTE — Telephone Encounter (Signed)
Incoming call from pt's caregiver (pt gave verbal permission to speak with her as he is very hard of hearing) she states that patient has been awake and in pain since 4am this morning. She has given the pt AZO with no relief. She believes that pt is having bladder spasms as urine is coming from around the penis, she states he is soaking the bed. She has not checked pt's tempeture but states he was "clamy" this morning, unclear if pt completed antibiotic course. She states that home health nurse visited pt yesterday, at which time issues with the catheter worsened. Pt added onto today's schedule for evaluation.

## 2020-12-04 ENCOUNTER — Ambulatory Visit: Payer: Medicare Other | Admitting: Physician Assistant

## 2020-12-05 ENCOUNTER — Other Ambulatory Visit: Payer: Medicare Other

## 2020-12-08 ENCOUNTER — Telehealth: Payer: Self-pay | Admitting: Family Medicine

## 2020-12-08 NOTE — Telephone Encounter (Signed)
[facility] from [facility] called stating patient has dark bloody urine coming out into catheter bag. I asked her to be sure there was lax on the catheter and no tension. She explained there is a sticker attached to the catheter and the leg bag is up on the thigh. I explained the catheter placement seems to be correct. She states the catheter is draining properly.  I informed her that if the catheter is draining fine the dark urine is old blood and he is scheduled to come in the AM to have the catheter removed. I informed her to have him push fluids today and as long as the catheter is draining and not getting clogged it should be ok until the morning. Samantha voiced understanding.

## 2020-12-09 ENCOUNTER — Inpatient Hospital Stay: Admit: 2020-12-09 | Payer: Medicare Other | Admitting: Surgery

## 2020-12-09 ENCOUNTER — Encounter: Payer: Self-pay | Admitting: Urology

## 2020-12-09 ENCOUNTER — Other Ambulatory Visit: Payer: Self-pay

## 2020-12-09 ENCOUNTER — Ambulatory Visit: Payer: Medicare Other | Admitting: Urology

## 2020-12-09 VITALS — BP 80/42 | HR 105 | Ht 70.0 in | Wt 160.0 lb

## 2020-12-09 DIAGNOSIS — N138 Other obstructive and reflux uropathy: Secondary | ICD-10-CM

## 2020-12-09 DIAGNOSIS — R339 Retention of urine, unspecified: Secondary | ICD-10-CM

## 2020-12-09 DIAGNOSIS — N401 Enlarged prostate with lower urinary tract symptoms: Secondary | ICD-10-CM | POA: Diagnosis not present

## 2020-12-09 LAB — BLADDER SCAN AMB NON-IMAGING: Scan Result: 49

## 2020-12-09 SURGERY — ARTHROPLASTY, SHOULDER, TOTAL, REVERSE
Anesthesia: Choice | Site: Shoulder | Laterality: Left

## 2020-12-09 NOTE — Progress Notes (Signed)
   12/09/2020 2:12 PM   George [name] 18-Sep-1932 [id number]  Reason for visit: Follow up BPH, urinary retention, elevated PSA  HPI: He is a very comorbid and frail-appearing 87 year old male with past medical history notable for CAD with stent, COPD, type 2 diabetes, tobacco use who was hospitalized on 11/04/2020 for acute urinary retention with hyperkalemia and acute on chronic kidney injury.  Urology was not consulted at that admission.  He was discharged on 11/09/2020 with Foley in place.  CT was performed at that admission and showed no hydronephrosis or nephrolithiasis, but significant BPH with a prostate volume measuring 130 g.  He has a long history of BPH and urinary symptoms on doxazosin, and previously was followed by Dr. Tresa Moore.  He reports 2-3 prior episodes of retention.  His primary urinary symptoms are weak stream, urgency, frequency, and feeling of incomplete emptying.  He originally passed a voiding trial in clinic on 11/26/2020 and was able to void multiple times with a PVR of only 8 mL.  Unfortunately, he presented to the ED within 24 hours with recurrent retention and a Foley catheter was placed again.  He presents today for a repeat voiding trial after being on alfuzosin and finasteride, as well as recently nitrofurantoin for 1 week to sterilize the urine which was prescribed by Zara Council, PA.  He has had some trouble with some dark urine and gross hematuria and it sounds like bladder spasms.  His bladder was filled with 100 mL of fluid and the catheter removed this morning.  He has been able to urinate yellow urine multiple times today without pain, and PVR is normal at 40 mL this afternoon.  I discussed at length with him the importance of taking alfuzosin and finasteride long-term to help relax and shrink the prostate and prevent recurrent episodes of urinary retention.  Return precautions were discussed extensively.  He is on testosterone for what sounds like severe  depression and apathy.  He would like to continue this medication as he feels it makes a remarkable difference in his quality of life, and I think that is reasonable.  He had a relatively stable PSA around 3 over the last few years which is very reassuring, especially in the setting of his very large prostate measuring 130 g.  A single elevated PSA of 7 was recently drawn but this was with the catheter and is not unexpected.  I think the benefits of testosterone by his PCP significantly outweigh the risk.  -Continue alfuzosin and finasteride long-term, discussed this at length and refills provided -RTC 2 months IPSS and PVR -If he develops recurrent retention, need to strongly consider HOLEP-he is certainly not an ideal surgical candidate, but based on his number of ER and clinic visits with an indwelling Foley secondary to problems, I do not think he would tolerate a long-term chronic Foley, and would not be able to perform intermittent catheterization  I spent 35 total minutes on the day of the encounter including pre-visit review of the medical record, face-to-face time with the patient, and post visit ordering of labs/imaging/tests.   [name], [facility] [address] [place], [place] [address] [phone]

## 2020-12-09 NOTE — Progress Notes (Signed)
Patient ID: George [name], male   DOB: 05-Nov-1931, 87 y.o.   MRN: [id number] Fill and Pull Catheter Removal  Patient is present today for a catheter removal.  Patient was cleaned and prepped in a sterile fashion 110 ml of sterile water/ saline was instilled into the bladder when the patient felt the urge to urinate. 6ml of water was then drained from the balloon.  A 16FR foley cath was removed from the bladder no complications were noted .  Patient as then given some time to void on their own.  Patient cannot void on their own after some time.  Patient tolerated well.  Performed by: Edwin Dada, CMA  Follow up/ Additional notes: pt will return this afternoon for PVR

## 2020-12-09 NOTE — Patient Instructions (Addendum)
Continue Alfuzosin and Finasteride forever. These help relax and shrink the prostate to improve your urination and prevent needing a catheter again.   OK to resume testosterone.  If you are not able to urinate call the clinic or present to the ED.

## 2020-12-12 ENCOUNTER — Other Ambulatory Visit: Payer: Self-pay | Admitting: *Deleted

## 2020-12-12 DIAGNOSIS — Z466 Encounter for fitting and adjustment of urinary device: Secondary | ICD-10-CM

## 2020-12-12 DIAGNOSIS — N138 Other obstructive and reflux uropathy: Secondary | ICD-10-CM

## 2020-12-12 MED ORDER — FINASTERIDE 5 MG PO TABS: 5.0000 mg | ORAL_TABLET | Freq: Every day | ORAL | 3 refills | Status: DC

## 2020-12-12 MED ORDER — ALFUZOSIN HCL ER 10 MG PO TB24: 10.0000 mg | ORAL_TABLET | Freq: Every day | ORAL | 3 refills | Status: DC

## 2020-12-15 ENCOUNTER — Telehealth: Payer: Self-pay

## 2020-12-15 NOTE — Telephone Encounter (Signed)
Carmin Muskrat patient's friend (on [place]) called stating [facility] did not receive finasteride and uroxatral scripts on Friday. [place] receipt confirmed, contacted pharmacy and pharmacist did confirm receipt of script states that the fill is to early on uroxatral but can fill finasteride. It is possible there was a miscommunication with the new clerk. Spoke with Vania Rea and notified her of this and she verbalized understanding

## 2021-01-22 ENCOUNTER — Other Ambulatory Visit: Payer: Self-pay | Admitting: Surgery

## 2021-02-02 ENCOUNTER — Other Ambulatory Visit: Payer: Self-pay

## 2021-02-02 ENCOUNTER — Other Ambulatory Visit
Admission: RE | Admit: 2021-02-02 | Discharge: 2021-02-02 | Disposition: A | Discharge status: Home / Self Care | Payer: Medicare Other | Source: Ambulatory Visit | Attending: Surgery | Admitting: Surgery

## 2021-02-02 DIAGNOSIS — Z01812 Encounter for preprocedural laboratory examination: Secondary | ICD-10-CM | POA: Insufficient documentation

## 2021-02-02 HISTORY — DX: Chronic obstructive pulmonary disease, unspecified: J44.9

## 2021-02-02 HISTORY — DX: Atherosclerotic heart disease of native coronary artery without angina pectoris: I25.10

## 2021-02-02 HISTORY — DX: Gastro-esophageal reflux disease without esophagitis: K21.9

## 2021-02-02 HISTORY — DX: Pneumonia, unspecified organism: J18.9

## 2021-02-02 HISTORY — DX: Pure hypercholesterolemia, unspecified: E78.00

## 2021-02-02 HISTORY — DX: Essential (primary) hypertension: I10

## 2021-02-02 HISTORY — DX: Anemia, unspecified: D64.9

## 2021-02-02 HISTORY — DX: Unspecified osteoarthritis, unspecified site: M19.90

## 2021-02-02 HISTORY — DX: Dyspnea, unspecified: R06.00

## 2021-02-02 HISTORY — DX: Sleep apnea, unspecified: G47.30

## 2021-02-02 HISTORY — DX: Benign prostatic hyperplasia without lower urinary tract symptoms: N40.0

## 2021-02-02 LAB — CBC WITH DIFFERENTIAL/PLATELET
Abs Immature Granulocytes: 0.04 10*3/uL (ref 0.00–0.07)
Basophils Absolute: 0.1 10*3/uL (ref 0.0–0.1)
Basophils Relative: 1 %
Eosinophils Absolute: 0.2 10*3/uL (ref 0.0–0.5)
Eosinophils Relative: 2 %
HCT: 42.1 % (ref 39.0–52.0)
Hemoglobin: 14.3 g/dL (ref 13.0–17.0)
Immature Granulocytes: 0 %
Lymphocytes Relative: 15 %
Lymphs Abs: 1.4 10*3/uL (ref 0.7–4.0)
MCH: 32.6 pg (ref 26.0–34.0)
MCHC: 34 g/dL (ref 30.0–36.0)
MCV: 96.1 fL (ref 80.0–100.0)
Monocytes Absolute: 0.8 10*3/uL (ref 0.1–1.0)
Monocytes Relative: 8 %
Neutro Abs: 7.1 10*3/uL (ref 1.7–7.7)
Neutrophils Relative %: 74 %
Platelets: 275 10*3/uL (ref 150–400)
RBC: 4.38 MIL/uL (ref 4.22–5.81)
RDW: 14.3 % (ref 11.5–15.5)
WBC: 9.5 10*3/uL (ref 4.0–10.5)
nRBC: 0 % (ref 0.0–0.2)

## 2021-02-02 LAB — COMPREHENSIVE METABOLIC PANEL
ALT: 13 U/L (ref 0–44)
AST: 18 U/L (ref 15–41)
Albumin: 3.8 g/dL (ref 3.5–5.0)
Alkaline Phosphatase: 52 U/L (ref 38–126)
Anion gap: 8 (ref 5–15)
BUN: 30 mg/dL — ABNORMAL HIGH (ref 8–23)
CO2: 26 mmol/L (ref 22–32)
Calcium: 8.7 mg/dL — ABNORMAL LOW (ref 8.9–10.3)
Chloride: 107 mmol/L (ref 98–111)
Creatinine, Ser: 1.6 mg/dL — ABNORMAL HIGH (ref 0.61–1.24)
GFR, Estimated: 41 mL/min — ABNORMAL LOW (ref >60)
Glucose, Bld: 111 mg/dL — ABNORMAL HIGH (ref 70–99)
Potassium: 4.4 mmol/L (ref 3.5–5.1)
Sodium: 141 mmol/L (ref 135–145)
Total Bilirubin: 0.8 mg/dL (ref 0.3–1.2)
Total Protein: 6.5 g/dL (ref 6.5–8.1)

## 2021-02-02 LAB — HEMOGLOBIN A1C
Hgb A1c MFr Bld: 5.1 % (ref 4.8–5.6)
Mean Plasma Glucose: 99.67 mg/dL

## 2021-02-02 LAB — TYPE AND SCREEN
ABO/RH(D): O POS
Antibody Screen: NEGATIVE

## 2021-02-02 LAB — URINALYSIS, ROUTINE W REFLEX MICROSCOPIC
Bilirubin Urine: NEGATIVE
Glucose, UA: NEGATIVE mg/dL
Hgb urine dipstick: NEGATIVE
Ketones, ur: NEGATIVE mg/dL
Leukocytes,Ua: NEGATIVE
Nitrite: NEGATIVE
Protein, ur: 30 mg/dL — AB
Specific Gravity, Urine: 1.017 (ref 1.005–1.030)
Squamous Epithelial / HPF: NONE SEEN (ref 0–5)
pH: 5 (ref 5.0–8.0)

## 2021-02-02 LAB — SURGICAL PCR SCREEN
MRSA, PCR: NEGATIVE
Staphylococcus aureus: NEGATIVE

## 2021-02-02 NOTE — Pre-Procedure Instructions (Signed)
Called over to dr poggi's office and spoke with Tiffany about when pt needs to stop his 81 mg ASA. She spoke with Dr Roland Rack and he said that he wanted pt to continue 81 mg ASA. I gave pt these instructions to continue ASA bur not to take ASA the morning of surgery. Pt verbalized understanding

## 2021-02-02 NOTE — Patient Instructions (Addendum)
Your procedure is scheduled on:02-10-21 TUESDAY Report to the Registration Desk on the 1st floor of the Medical Mall-Then proceed to the 2nd floor Surgery Desk in the [place] To find out your arrival time, please call [phone] between 1PM - 3PM on:02-09-21 MONDAY  REMEMBER: Instructions that are not followed completely may result in serious medical risk, up to and including death; or upon the discretion of your surgeon and anesthesiologist your surgery may need to be rescheduled.  Do not eat food after midnight the night before surgery.  No gum chewing, lozengers or hard candies.  You may however, drink CLEAR liquids up to 2 hours before you are scheduled to arrive for your surgery. Do not drink anything within 2 hours of your scheduled arrival time.  Clear liquids include: - water  - apple juice without pulp - gatorade - black coffee or tea (Do NOT add milk or creamers to the coffee or tea) Do NOT drink anything that is not on this list.  In addition, your doctor has ordered for you to drink the provided  Ensure Pre-Surgery Clear Carbohydrate Drink  Drinking this carbohydrate drink up to two hours before surgery helps to reduce insulin resistance and improve patient outcomes. Please complete drinking 2 hours prior to scheduled arrival time.  TAKE THESE MEDICATIONS THE MORNING OF SURGERY WITH A SIP OF WATER: -UROXATRAL (ALFUZOSIN) -PROSCAR (FINASTERIDE) -VERAPAMIL (CALAN-SR) -PRILOSEC (OMEPRAZOLE)-take one the night before and one on the morning of surgery - helps to prevent nausea after surgery.)  USE YOUR ADVAIR INHALER THE DAY OF SURGERY  Follow recommendations from Cardiologist, Pulmonologist or PCP regarding stopping Aspirin, Coumadin, Plavix, Eliquis, Pradaxa, or Pletal-CONTINUE YOUR 81 MG ASPIRIN PER DR POGGI-DO NOT TAKE YOUR ASPIRIN THE MORNING OF SURGERY  One week prior to surgery: Stop Anti-inflammatories (NSAIDS) such as Advil, Aleve, Ibuprofen, Motrin, Naproxen,  Naprosyn and Aspirin based products such as Excedrin, Goodys Powder, BC Powder-OK TO TAKE TYLENOL IF NEEDED  Stop ANY OVER THE COUNTER supplements until after surgery-HOWEVER, YOU MAY CONTINUE YOUR VITAMIN B12 UP UNTIL THE DAY PRIOR TO SURGERY  No Alcohol for 24 hours before or after surgery.  No Smoking including e-cigarettes for 24 hours prior to surgery.  No chewable tobacco products for at least 6 hours prior to surgery.  No nicotine patches on the day of surgery.  Do not use any "recreational" drugs for at least a week prior to your surgery.  Please be advised that the combination of cocaine and anesthesia may have negative outcomes, up to and including death. If you test positive for cocaine, your surgery will be cancelled.  On the morning of surgery brush your teeth with toothpaste and water, you may rinse your mouth with mouthwash if you wish. Do not swallow any toothpaste or mouthwash.  Do not wear jewelry, make-up, hairpins, clips or nail polish.  Do not wear lotions, powders, or perfumes.   Do not shave body from the neck down 48 hours prior to surgery just in case you cut yourself which could leave a site for infection.  Also, freshly shaved skin may become irritated if using the CHG soap.  Contact lenses, hearing aids and dentures may not be worn into surgery.  Do not bring valuables to the hospital. [facility] is not responsible for any missing/lost belongings or valuables.   Use CHG Soap as directed on instruction sheet.  Total Shoulder Arthroplasty:  use Benzolyl Peroxide 5% Gel as directed on instruction sheet.  Notify your doctor if  there is any change in your medical condition (cold, fever, infection).  Wear comfortable clothing (specific to your surgery type) to the hospital.  Plan for stool softeners for home use; pain medications have a tendency to cause constipation. You can also help prevent constipation by eating foods high in fiber such as fruits and  vegetables and drinking plenty of fluids as your diet allows.  After surgery, you can help prevent lung complications by doing breathing exercises.  Take deep breaths and cough every 1-2 hours. Your doctor may order a device called an Incentive Spirometer to help you take deep breaths. When coughing or sneezing, hold a pillow firmly against your incision with both hands. This is called "splinting." Doing this helps protect your incision. It also decreases belly discomfort.  If you are being admitted to the hospital overnight, leave your suitcase in the car. After surgery it may be brought to your room.  If you are being discharged the day of surgery, you will not be allowed to drive home. You will need a responsible adult (18 years or older) to drive you home and stay with you that night.   If you are taking public transportation, you will need to have a responsible adult (18 years or older) with you. Please confirm with your physician that it is acceptable to use public transportation.   Please call the [facility] at [phone] if you have any questions about these instructions.  Surgery Visitation Policy:  Patients undergoing a surgery or procedure may have one family member or support person with them as long as that person is not COVID-19 positive or experiencing its symptoms.  That person may remain in the waiting area during the procedure.  Inpatient Visitation:    Visiting hours are 7 a.m. to 8 p.m. Inpatients will be allowed two visitors daily. The visitors may change each day during the patient's stay. No visitors under the age of 109. Any visitor under the age of 31 must be accompanied by an adult. The visitor must pass COVID-19 screenings, use hand sanitizer when entering and exiting the patient's room and wear a mask at all times, including in the patient's room. Patients must also wear a mask when staff or their visitor are in the room. Masking is  required regardless of vaccination status.

## 2021-02-06 ENCOUNTER — Other Ambulatory Visit: Payer: Self-pay

## 2021-02-06 ENCOUNTER — Other Ambulatory Visit
Admission: RE | Admit: 2021-02-06 | Discharge: 2021-02-06 | Disposition: A | Discharge status: Home / Self Care | Payer: Medicare Other | Source: Ambulatory Visit | Attending: Surgery | Admitting: Surgery

## 2021-02-06 DIAGNOSIS — Z01812 Encounter for preprocedural laboratory examination: Secondary | ICD-10-CM | POA: Diagnosis present

## 2021-02-06 DIAGNOSIS — Z20822 Contact with and (suspected) exposure to covid-19: Secondary | ICD-10-CM | POA: Insufficient documentation

## 2021-02-06 LAB — SARS CORONAVIRUS 2 (TAT 6-24 HRS): SARS Coronavirus 2: NEGATIVE

## 2021-02-10 ENCOUNTER — Encounter: Payer: Self-pay | Admitting: Surgery

## 2021-02-10 ENCOUNTER — Inpatient Hospital Stay: Payer: Medicare Other

## 2021-02-10 ENCOUNTER — Other Ambulatory Visit: Payer: Self-pay

## 2021-02-10 ENCOUNTER — Inpatient Hospital Stay: Payer: Medicare Other | Admitting: Anesthesiology

## 2021-02-10 ENCOUNTER — Inpatient Hospital Stay
Admission: RE | Admit: 2021-02-10 | Discharge: 2021-02-12 | DRG: 483 | Disposition: A | Discharge status: Home Health | Payer: Medicare Other | Attending: Surgery | Admitting: Surgery

## 2021-02-10 ENCOUNTER — Encounter
Admission: RE | Disposition: A | Discharge status: Home Health | Payer: Self-pay | Source: Home / Self Care | Attending: Surgery

## 2021-02-10 DIAGNOSIS — N486 Induration penis plastica: Secondary | ICD-10-CM | POA: Diagnosis present

## 2021-02-10 DIAGNOSIS — S46012A Strain of muscle(s) and tendon(s) of the rotator cuff of left shoulder, initial encounter: Principal | ICD-10-CM | POA: Diagnosis present

## 2021-02-10 DIAGNOSIS — R0902 Hypoxemia: Secondary | ICD-10-CM | POA: Diagnosis not present

## 2021-02-10 DIAGNOSIS — D649 Anemia, unspecified: Secondary | ICD-10-CM | POA: Diagnosis present

## 2021-02-10 DIAGNOSIS — E785 Hyperlipidemia, unspecified: Secondary | ICD-10-CM | POA: Diagnosis present

## 2021-02-10 DIAGNOSIS — M1990 Unspecified osteoarthritis, unspecified site: Secondary | ICD-10-CM | POA: Diagnosis present

## 2021-02-10 DIAGNOSIS — Z9181 History of falling: Secondary | ICD-10-CM | POA: Diagnosis not present

## 2021-02-10 DIAGNOSIS — I10 Essential (primary) hypertension: Secondary | ICD-10-CM | POA: Diagnosis present

## 2021-02-10 DIAGNOSIS — N400 Enlarged prostate without lower urinary tract symptoms: Secondary | ICD-10-CM | POA: Diagnosis present

## 2021-02-10 DIAGNOSIS — K219 Gastro-esophageal reflux disease without esophagitis: Secondary | ICD-10-CM | POA: Diagnosis present

## 2021-02-10 DIAGNOSIS — Z86010 Personal history of colonic polyps: Secondary | ICD-10-CM

## 2021-02-10 DIAGNOSIS — I2510 Atherosclerotic heart disease of native coronary artery without angina pectoris: Secondary | ICD-10-CM | POA: Diagnosis present

## 2021-02-10 DIAGNOSIS — J449 Chronic obstructive pulmonary disease, unspecified: Secondary | ICD-10-CM | POA: Diagnosis present

## 2021-02-10 DIAGNOSIS — Z96612 Presence of left artificial shoulder joint: Secondary | ICD-10-CM

## 2021-02-10 DIAGNOSIS — E119 Type 2 diabetes mellitus without complications: Secondary | ICD-10-CM | POA: Diagnosis present

## 2021-02-10 DIAGNOSIS — E875 Hyperkalemia: Secondary | ICD-10-CM | POA: Diagnosis present

## 2021-02-10 DIAGNOSIS — Z85038 Personal history of other malignant neoplasm of large intestine: Secondary | ICD-10-CM | POA: Diagnosis not present

## 2021-02-10 DIAGNOSIS — Z79899 Other long term (current) drug therapy: Secondary | ICD-10-CM | POA: Diagnosis not present

## 2021-02-10 DIAGNOSIS — Z20822 Contact with and (suspected) exposure to covid-19: Secondary | ICD-10-CM | POA: Diagnosis present

## 2021-02-10 DIAGNOSIS — Z7951 Long term (current) use of inhaled steroids: Secondary | ICD-10-CM

## 2021-02-10 DIAGNOSIS — Z7982 Long term (current) use of aspirin: Secondary | ICD-10-CM

## 2021-02-10 DIAGNOSIS — M25511 Pain in right shoulder: Secondary | ICD-10-CM | POA: Diagnosis present

## 2021-02-10 HISTORY — PX: REVERSE SHOULDER ARTHROPLASTY: SHX5054

## 2021-02-10 LAB — GLUCOSE, CAPILLARY: Glucose-Capillary: 108 mg/dL — ABNORMAL HIGH (ref 70–99)

## 2021-02-10 LAB — ABO/RH: ABO/RH(D): O POS

## 2021-02-10 SURGERY — ARTHROPLASTY, SHOULDER, TOTAL, REVERSE
Anesthesia: General | Site: Shoulder | Laterality: Left

## 2021-02-10 MED ORDER — DOCUSATE SODIUM 100 MG PO CAPS
100.0000 mg | ORAL_CAPSULE | Freq: Two times a day (BID) | ORAL | Status: DC
  Administered 2021-02-11 – 2021-02-12 (×2): 100 mg via ORAL
  Filled 2021-02-10 (×5): qty 1

## 2021-02-10 MED ORDER — CEFAZOLIN SODIUM-DEXTROSE 2-4 GM/100ML-% IV SOLN
2.0000 g | INTRAVENOUS | Status: AC
  Administered 2021-02-10: 2 g via INTRAVENOUS

## 2021-02-10 MED ORDER — ACETAMINOPHEN 10 MG/ML IV SOLN
INTRAVENOUS | Status: DC | PRN
  Administered 2021-02-10: 1000 mg via INTRAVENOUS

## 2021-02-10 MED ORDER — MOMETASONE FURO-FORMOTEROL FUM 200-5 MCG/ACT IN AERO
2.0000 | INHALATION_SPRAY | Freq: Two times a day (BID) | RESPIRATORY_TRACT | Status: DC
  Administered 2021-02-11 – 2021-02-12 (×4): 2 via RESPIRATORY_TRACT
  Filled 2021-02-10 (×2): qty 8.8

## 2021-02-10 MED ORDER — LISINOPRIL 20 MG PO TABS
20.0000 mg | ORAL_TABLET | Freq: Two times a day (BID) | ORAL | Status: DC
  Administered 2021-02-10: 20 mg via ORAL
  Filled 2021-02-10: qty 1

## 2021-02-10 MED ORDER — HYDROCODONE-ACETAMINOPHEN 5-325 MG PO TABS
1.0000 | ORAL_TABLET | ORAL | Status: DC | PRN
  Administered 2021-02-10: 1 via ORAL
  Administered 2021-02-11: 2 via ORAL
  Administered 2021-02-12: 1 via ORAL
  Administered 2021-02-12: 2 via ORAL
  Filled 2021-02-10 (×2): qty 2
  Filled 2021-02-10 (×2): qty 1
  Filled 2021-02-10: qty 2

## 2021-02-10 MED ORDER — ACETAMINOPHEN 10 MG/ML IV SOLN
INTRAVENOUS | Status: AC
  Filled 2021-02-10: qty 100

## 2021-02-10 MED ORDER — IPRATROPIUM-ALBUTEROL 0.5-2.5 (3) MG/3ML IN SOLN: 3.0000 mL | Freq: Once | RESPIRATORY_TRACT | Status: AC

## 2021-02-10 MED ORDER — HYOSCYAMINE SULFATE 0.125 MG SL SUBL
0.1250 mg | SUBLINGUAL_TABLET | SUBLINGUAL | Status: DC | PRN
  Filled 2021-02-10: qty 1

## 2021-02-10 MED ORDER — NITROFURANTOIN MONOHYD MACRO 100 MG PO CAPS
100.0000 mg | ORAL_CAPSULE | Freq: Two times a day (BID) | ORAL | Status: DC
  Administered 2021-02-11 – 2021-02-12 (×4): 100 mg via ORAL
  Filled 2021-02-10 (×6): qty 1

## 2021-02-10 MED ORDER — IPRATROPIUM-ALBUTEROL 0.5-2.5 (3) MG/3ML IN SOLN
RESPIRATORY_TRACT | Status: AC
  Administered 2021-02-10: 3 mL via RESPIRATORY_TRACT
  Filled 2021-02-10: qty 3

## 2021-02-10 MED ORDER — PHENYLEPHRINE HCL (PRESSORS) 10 MG/ML IV SOLN
INTRAVENOUS | Status: DC | PRN
  Administered 2021-02-10 (×2): 100 ug via INTRAVENOUS
  Administered 2021-02-10: 200 ug via INTRAVENOUS

## 2021-02-10 MED ORDER — ALFUZOSIN HCL ER 10 MG PO TB24
10.0000 mg | ORAL_TABLET | Freq: Every day | ORAL | Status: DC
  Administered 2021-02-11 – 2021-02-12 (×2): 10 mg via ORAL
  Filled 2021-02-10 (×3): qty 1

## 2021-02-10 MED ORDER — ACETAMINOPHEN 325 MG PO TABS
325.0000 mg | ORAL_TABLET | Freq: Four times a day (QID) | ORAL | Status: DC | PRN
  Administered 2021-02-10: 500 mg via ORAL

## 2021-02-10 MED ORDER — PROPOFOL 10 MG/ML IV BOLUS
INTRAVENOUS | Status: DC | PRN
  Administered 2021-02-10: 150 mg via INTRAVENOUS

## 2021-02-10 MED ORDER — METOCLOPRAMIDE HCL 5 MG/ML IJ SOLN
5.0000 mg | Freq: Three times a day (TID) | INTRAMUSCULAR | Status: DC | PRN
Start: 2021-02-10 — End: 2021-02-12

## 2021-02-10 MED ORDER — VERAPAMIL HCL ER 240 MG PO TBCR
240.0000 mg | EXTENDED_RELEASE_TABLET | Freq: Two times a day (BID) | ORAL | Status: DC
  Administered 2021-02-11 – 2021-02-12 (×3): 240 mg via ORAL
  Filled 2021-02-10 (×6): qty 1

## 2021-02-10 MED ORDER — SIMVASTATIN 20 MG PO TABS
10.0000 mg | ORAL_TABLET | Freq: Every day | ORAL | Status: DC
  Administered 2021-02-10 – 2021-02-11 (×2): 10 mg via ORAL
  Filled 2021-02-10 (×3): qty 1

## 2021-02-10 MED ORDER — ONDANSETRON HCL 4 MG/2ML IJ SOLN
INTRAMUSCULAR | Status: DC | PRN
  Administered 2021-02-10: 4 mg via INTRAVENOUS

## 2021-02-10 MED ORDER — SODIUM CHLORIDE FLUSH 0.9 % IV SOLN
INTRAVENOUS | Status: AC
  Filled 2021-02-10: qty 40

## 2021-02-10 MED ORDER — LACTATED RINGERS IV SOLN: INTRAVENOUS | Status: DC | PRN

## 2021-02-10 MED ORDER — SUGAMMADEX SODIUM 200 MG/2ML IV SOLN
INTRAVENOUS | Status: DC | PRN
  Administered 2021-02-10: 200 mg via INTRAVENOUS

## 2021-02-10 MED ORDER — HYOSCYAMINE SULFATE 0.125 MG PO TABS
0.1250 mg | ORAL_TABLET | ORAL | Status: DC | PRN
  Filled 2021-02-10: qty 1

## 2021-02-10 MED ORDER — KETOROLAC TROMETHAMINE 15 MG/ML IJ SOLN: 7.5000 mg | Freq: Four times a day (QID) | INTRAMUSCULAR | Status: DC | PRN

## 2021-02-10 MED ORDER — MAGNESIUM HYDROXIDE 400 MG/5ML PO SUSP
30.0000 mL | Freq: Every day | ORAL | Status: DC | PRN
  Administered 2021-02-10: 30 mL via ORAL
  Filled 2021-02-10: qty 30

## 2021-02-10 MED ORDER — FENTANYL CITRATE (PF) 100 MCG/2ML IJ SOLN
25.0000 ug | INTRAMUSCULAR | Status: DC | PRN
Start: 2021-02-10 — End: 2021-02-10

## 2021-02-10 MED ORDER — ENOXAPARIN SODIUM 40 MG/0.4ML IJ SOSY
40.0000 mg | PREFILLED_SYRINGE | INTRAMUSCULAR | Status: DC
  Administered 2021-02-11: 40 mg via SUBCUTANEOUS
  Filled 2021-02-10 (×2): qty 0.4

## 2021-02-10 MED ORDER — TRAMADOL HCL 50 MG PO TABS
50.0000 mg | ORAL_TABLET | Freq: Four times a day (QID) | ORAL | Status: DC | PRN
  Administered 2021-02-11 – 2021-02-12 (×2): 50 mg via ORAL
  Filled 2021-02-10 (×3): qty 1

## 2021-02-10 MED ORDER — CEFAZOLIN SODIUM-DEXTROSE 2-4 GM/100ML-% IV SOLN
INTRAVENOUS | Status: AC
  Filled 2021-02-10: qty 100

## 2021-02-10 MED ORDER — CHLORHEXIDINE GLUCONATE 0.12 % MT SOLN: 15.0000 mL | Freq: Once | OROMUCOSAL | Status: AC

## 2021-02-10 MED ORDER — BUPIVACAINE LIPOSOME 1.3 % IJ SUSP
INTRAMUSCULAR | Status: AC
  Filled 2021-02-10: qty 20

## 2021-02-10 MED ORDER — FINASTERIDE 5 MG PO TABS
5.0000 mg | ORAL_TABLET | Freq: Every day | ORAL | Status: DC
  Administered 2021-02-11 – 2021-02-12 (×2): 5 mg via ORAL
  Filled 2021-02-10 (×2): qty 1

## 2021-02-10 MED ORDER — BUPIVACAINE-EPINEPHRINE (PF) 0.5% -1:200000 IJ SOLN
INTRAMUSCULAR | Status: AC
  Filled 2021-02-10: qty 30

## 2021-02-10 MED ORDER — CYANOCOBALAMIN 1000 MCG/ML IJ SOLN
1000.0000 ug | INTRAMUSCULAR | Status: DC
  Administered 2021-02-10: 1000 ug via INTRAMUSCULAR
  Filled 2021-02-10: qty 1

## 2021-02-10 MED ORDER — BISACODYL 10 MG RE SUPP: 10.0000 mg | Freq: Every day | RECTAL | Status: DC | PRN

## 2021-02-10 MED ORDER — SODIUM CHLORIDE (PF) 0.9 % IJ SOLN
INTRAMUSCULAR | Status: DC | PRN
  Administered 2021-02-10: 40 mL via INTRAVENOUS

## 2021-02-10 MED ORDER — MORPHINE SULFATE (PF) 2 MG/ML IV SOLN
2.0000 mg | INTRAVENOUS | Status: DC | PRN
  Administered 2021-02-10: 2 mg via INTRAVENOUS
  Filled 2021-02-10 (×3): qty 1

## 2021-02-10 MED ORDER — BUPIVACAINE-EPINEPHRINE (PF) 0.5% -1:200000 IJ SOLN
INTRAMUSCULAR | Status: DC | PRN
  Administered 2021-02-10: 30 mL via PERINEURAL

## 2021-02-10 MED ORDER — TRANEXAMIC ACID 1000 MG/10ML IV SOLN
INTRAVENOUS | Status: AC
  Filled 2021-02-10: qty 10

## 2021-02-10 MED ORDER — ORAL CARE MOUTH RINSE: 15.0000 mL | Freq: Once | OROMUCOSAL | Status: AC

## 2021-02-10 MED ORDER — SODIUM CHLORIDE 0.9 % IV SOLN: INTRAVENOUS | Status: DC

## 2021-02-10 MED ORDER — SILDENAFIL CITRATE 20 MG PO TABS
40.0000 mg | ORAL_TABLET | ORAL | Status: DC | PRN
Start: 2021-02-10 — End: 2021-02-10

## 2021-02-10 MED ORDER — CEFAZOLIN SODIUM-DEXTROSE 2-4 GM/100ML-% IV SOLN
2.0000 g | Freq: Four times a day (QID) | INTRAVENOUS | Status: AC
  Administered 2021-02-10 – 2021-02-11 (×3): 2 g via INTRAVENOUS
  Filled 2021-02-10 (×3): qty 100

## 2021-02-10 MED ORDER — TRANEXAMIC ACID 1000 MG/10ML IV SOLN
INTRAVENOUS | Status: DC | PRN
  Administered 2021-02-10: 1000 mg via INTRAVENOUS

## 2021-02-10 MED ORDER — FENTANYL CITRATE (PF) 100 MCG/2ML IJ SOLN
INTRAMUSCULAR | Status: DC | PRN
  Administered 2021-02-10: 25 ug via INTRAVENOUS
  Administered 2021-02-10: 50 ug via INTRAVENOUS

## 2021-02-10 MED ORDER — METOCLOPRAMIDE HCL 10 MG PO TABS: 5.0000 mg | ORAL_TABLET | Freq: Three times a day (TID) | ORAL | Status: DC | PRN

## 2021-02-10 MED ORDER — ONDANSETRON HCL 4 MG/2ML IJ SOLN: 4.0000 mg | Freq: Once | INTRAMUSCULAR | Status: DC | PRN

## 2021-02-10 MED ORDER — ROCURONIUM BROMIDE 100 MG/10ML IV SOLN
INTRAVENOUS | Status: DC | PRN
  Administered 2021-02-10: 20 mg via INTRAVENOUS
  Administered 2021-02-10: 50 mg via INTRAVENOUS

## 2021-02-10 MED ORDER — ACETAMINOPHEN 500 MG PO TABS
500.0000 mg | ORAL_TABLET | Freq: Four times a day (QID) | ORAL | Status: AC
  Administered 2021-02-10 – 2021-02-11 (×4): 500 mg via ORAL
  Filled 2021-02-10 (×4): qty 1

## 2021-02-10 MED ORDER — CHLORHEXIDINE GLUCONATE 0.12 % MT SOLN
OROMUCOSAL | Status: AC
  Administered 2021-02-10: 15 mL via OROMUCOSAL
  Filled 2021-02-10: qty 15

## 2021-02-10 MED ORDER — PANTOPRAZOLE SODIUM 40 MG PO TBEC
40.0000 mg | DELAYED_RELEASE_TABLET | Freq: Every day | ORAL | Status: DC
  Administered 2021-02-11 – 2021-02-12 (×2): 40 mg via ORAL
  Filled 2021-02-10 (×2): qty 1

## 2021-02-10 MED ORDER — LACTATED RINGERS IV SOLN: INTRAVENOUS | Status: DC

## 2021-02-10 MED ORDER — SODIUM CHLORIDE 0.9 % IV SOLN
INTRAVENOUS | Status: DC | PRN
  Administered 2021-02-10: 60 mL

## 2021-02-10 MED ORDER — ONDANSETRON HCL 4 MG/2ML IJ SOLN: 4.0000 mg | Freq: Four times a day (QID) | INTRAMUSCULAR | Status: DC | PRN

## 2021-02-10 MED ORDER — FENTANYL CITRATE (PF) 100 MCG/2ML IJ SOLN
INTRAMUSCULAR | Status: AC
  Filled 2021-02-10: qty 2

## 2021-02-10 MED ORDER — LIDOCAINE HCL (CARDIAC) PF 100 MG/5ML IV SOSY
PREFILLED_SYRINGE | INTRAVENOUS | Status: DC | PRN
  Administered 2021-02-10: 100 mg via INTRAVENOUS

## 2021-02-10 MED ORDER — ASPIRIN EC 81 MG PO TBEC
81.0000 mg | DELAYED_RELEASE_TABLET | Freq: Every day | ORAL | Status: DC
  Administered 2021-02-10 – 2021-02-12 (×3): 81 mg via ORAL
  Filled 2021-02-10 (×2): qty 1

## 2021-02-10 MED ORDER — DIPHENHYDRAMINE HCL 12.5 MG/5ML PO ELIX
12.5000 mg | ORAL_SOLUTION | ORAL | Status: DC | PRN
  Administered 2021-02-12: 12.5 mg via ORAL
  Filled 2021-02-10: qty 10

## 2021-02-10 MED ORDER — PROPOFOL 10 MG/ML IV BOLUS
INTRAVENOUS | Status: AC
  Filled 2021-02-10: qty 20

## 2021-02-10 MED ORDER — DEXAMETHASONE SODIUM PHOSPHATE 10 MG/ML IJ SOLN
INTRAMUSCULAR | Status: DC | PRN
  Administered 2021-02-10: 10 mg via INTRAVENOUS

## 2021-02-10 MED ORDER — SODIUM CHLORIDE 0.9 % IV SOLN
INTRAVENOUS | Status: DC | PRN
  Administered 2021-02-10: 30 ug/min via INTRAVENOUS

## 2021-02-10 MED ORDER — KETOROLAC TROMETHAMINE 15 MG/ML IJ SOLN
15.0000 mg | Freq: Once | INTRAMUSCULAR | Status: AC
  Administered 2021-02-10: 15 mg via INTRAVENOUS
  Filled 2021-02-10: qty 1

## 2021-02-10 MED ORDER — ONDANSETRON HCL 4 MG PO TABS: 4.0000 mg | ORAL_TABLET | Freq: Four times a day (QID) | ORAL | Status: DC | PRN

## 2021-02-10 MED ORDER — FLEET ENEMA 7-19 GM/118ML RE ENEM: 1.0000 | ENEMA | Freq: Once | RECTAL | Status: DC | PRN

## 2021-02-10 SURGICAL SUPPLY — 70 items
BASEPLATE GLENOSPHERE 25 (Plate) ×2 IMPLANT
BEARING HUMERAL 40 STD VITE (Joint) ×2 IMPLANT
BIT DRILL TWIST 2.7 (BIT) ×2 IMPLANT
BLADE SAW SAG 25X90X1.19 (BLADE) ×2 IMPLANT
BNDG COHESIVE 4X5 TAN STRL (GAUZE/BANDAGES/DRESSINGS) ×2 IMPLANT
CHLORAPREP W/TINT 26 (MISCELLANEOUS) ×2 IMPLANT
COOLER POLAR GLACIER W/PUMP (MISCELLANEOUS) ×2 IMPLANT
COVER BACK TABLE REUSABLE LG (DRAPES) ×2 IMPLANT
COVER WAND RF STERILE (DRAPES) ×2 IMPLANT
DIAL VERSA SHOULDER 40 STD (Joint) ×2 IMPLANT
DRAPE 3/4 80X56 (DRAPES) ×4 IMPLANT
DRAPE IMP U-DRAPE 54X76 (DRAPES) ×4 IMPLANT
DRAPE INCISE IOBAN 66X45 STRL (DRAPES) ×4 IMPLANT
DRSG MEPILEX SACRM 8.7X9.8 (GAUZE/BANDAGES/DRESSINGS) ×2 IMPLANT
DRSG OPSITE POSTOP 4X6 (GAUZE/BANDAGES/DRESSINGS) ×2 IMPLANT
DRSG OPSITE POSTOP 4X8 (GAUZE/BANDAGES/DRESSINGS) IMPLANT
ELECT BLADE 6.5 EXT (BLADE) IMPLANT
ELECT CAUTERY BLADE 6.4 (BLADE) ×2 IMPLANT
ELECT REM PT RETURN 9FT ADLT (ELECTROSURGICAL) ×2
ELECTRODE REM PT RTRN 9FT ADLT (ELECTROSURGICAL) ×1 IMPLANT
GAUZE XEROFORM 1X8 LF (GAUZE/BANDAGES/DRESSINGS) ×2 IMPLANT
GLOVE SRG 8 PF TXTR STRL LF DI (GLOVE) ×1 IMPLANT
GLOVE SURG ENC MOIS LTX SZ7.5 (GLOVE) ×8 IMPLANT
GLOVE SURG ENC MOIS LTX SZ8 (GLOVE) ×8 IMPLANT
GLOVE SURG UNDER LTX SZ8 (GLOVE) ×2 IMPLANT
GLOVE SURG UNDER POLY LF SZ8 (GLOVE) ×1
GOWN STRL REUS W/ TWL LRG LVL3 (GOWN DISPOSABLE) ×1 IMPLANT
GOWN STRL REUS W/ TWL XL LVL3 (GOWN DISPOSABLE) ×1 IMPLANT
GOWN STRL REUS W/TWL LRG LVL3 (GOWN DISPOSABLE) ×1
GOWN STRL REUS W/TWL XL LVL3 (GOWN DISPOSABLE) ×1
HOOD PEEL AWAY FLYTE STAYCOOL (MISCELLANEOUS) ×8 IMPLANT
ILLUMINATOR WAVEGUIDE N/F (MISCELLANEOUS) IMPLANT
IV NS IRRIG 3000ML ARTHROMATIC (IV SOLUTION) ×2 IMPLANT
KIT STABILIZATION SHOULDER (MISCELLANEOUS) ×2 IMPLANT
KIT TURNOVER KIT A (KITS) ×2 IMPLANT
MANIFOLD NEPTUNE II (INSTRUMENTS) ×2 IMPLANT
MASK FACE SPIDER DISP (MASK) ×2 IMPLANT
MAT ABSORB  FLUID 56X50 GRAY (MISCELLANEOUS) ×1
MAT ABSORB FLUID 56X50 GRAY (MISCELLANEOUS) ×1 IMPLANT
NDL SAFETY ECLIPSE 18X1.5 (NEEDLE) ×1 IMPLANT
NEEDLE HYPO 18GX1.5 SHARP (NEEDLE) ×1
NEEDLE HYPO 22GX1.5 SAFETY (NEEDLE) ×2 IMPLANT
NEEDLE SPNL 20GX3.5 QUINCKE YW (NEEDLE) ×2 IMPLANT
NS IRRIG 500ML POUR BTL (IV SOLUTION) ×2 IMPLANT
PACK ARTHROSCOPY SHOULDER (MISCELLANEOUS) ×2 IMPLANT
PAD ARMBOARD 7.5X6 YLW CONV (MISCELLANEOUS) ×4 IMPLANT
PAD WRAPON POLAR SHDR UNIV (MISCELLANEOUS) ×1 IMPLANT
PENCIL SMOKE EVACUATOR (MISCELLANEOUS) ×2 IMPLANT
PIN THREADED REVERSE (PIN) ×2 IMPLANT
PULSAVAC PLUS IRRIG FAN TIP (DISPOSABLE) ×2
SCREW BONE LOCKING 4.75X30X3.5 (Screw) ×2 IMPLANT
SCREW BONE STRL 6.5MMX25MM (Screw) ×2 IMPLANT
SCREW LOCKING NS 4.75MMX20MM (Screw) ×4 IMPLANT
SCREW NON-LOCK 4.75MMX15MM (Screw) ×2 IMPLANT
SLING ULTRA II M (MISCELLANEOUS) ×2 IMPLANT
SPONGE LAP 18X18 RF (DISPOSABLE) ×2 IMPLANT
STAPLER SKIN PROX 35W (STAPLE) ×2 IMPLANT
STEM SHOULDER 17MMX55MM LONG (Stem) ×2 IMPLANT
SUT ETHIBOND 0 MO6 C/R (SUTURE) ×2 IMPLANT
SUT FIBERWIRE #2 38 BLUE 1/2 (SUTURE) ×6
SUT VIC AB 0 CT1 36 (SUTURE) ×2 IMPLANT
SUT VIC AB 2-0 CT1 27 (SUTURE) ×2
SUT VIC AB 2-0 CT1 TAPERPNT 27 (SUTURE) ×2 IMPLANT
SUTURE FIBERWR #2 38 BLUE 1/2 (SUTURE) ×3 IMPLANT
SYR 10ML LL (SYRINGE) ×2 IMPLANT
SYR 30ML LL (SYRINGE) ×2 IMPLANT
TIP FAN IRRIG PULSAVAC PLUS (DISPOSABLE) ×1 IMPLANT
TOWEL OR 17X26 4PK STRL BLUE (TOWEL DISPOSABLE) ×2 IMPLANT
TRAY HUM REV SHOULDER STD +6 (Shoulder) ×2 IMPLANT
WRAPON POLAR PAD SHDR UNIV (MISCELLANEOUS) ×2

## 2021-02-10 NOTE — Anesthesia Preprocedure Evaluation (Signed)
Anesthesia Evaluation  Patient identified by MRN, date of birth, ID band Patient awake    Reviewed: Allergy & Precautions, NPO status , Patient's Chart, lab work & pertinent test results  History of Anesthesia Complications Negative for: history of anesthetic complications  Airway Mallampati: II       Dental   Pulmonary sleep apnea , COPD,  COPD inhaler, Current Smoker and Patient abstained from smoking.,           Cardiovascular hypertension, Pt. on medications (-) Past MI and (-) CHF (-) dysrhythmias (-) Valvular Problems/Murmurs     Neuro/Psych neg Seizures    GI/Hepatic Neg liver ROS, GERD  Medicated,  Endo/Other  diabetes, Type 2  Renal/GU Renal InsufficiencyRenal disease     Musculoskeletal   Abdominal   Peds  Hematology   Anesthesia Other Findings   Reproductive/Obstetrics                             Anesthesia Physical Anesthesia Plan  ASA: III  Anesthesia Plan: General   Post-op Pain Management:    Induction: Intravenous  PONV Risk Score and Plan: 0  Airway Management Planned:   Additional Equipment:   Intra-op Plan:   Post-operative Plan:   Informed Consent: I have reviewed the patients History and Physical, chart, labs and discussed the procedure including the risks, benefits and alternatives for the proposed anesthesia with the patient or authorized representative who has indicated his/her understanding and acceptance.       Plan Discussed with:   Anesthesia Plan Comments:         Anesthesia Quick Evaluation

## 2021-02-10 NOTE — Transfer of Care (Signed)
Immediate Anesthesia Transfer of Care Note  Patient: TARIK TEIXEIRA  Procedure(s) Performed: REVERSE SHOULDER ARTHROPLASTY (Left Shoulder)  Patient Location: PACU  Anesthesia Type:General  Level of Consciousness: awake and alert   Airway & Oxygen Therapy: Patient Spontanous Breathing and Patient connected to face mask oxygen  Post-op Assessment: Report given to RN and Post -op Vital signs reviewed and stable  Post vital signs: Reviewed and stable  Last Vitals:  Vitals Value Taken Time  BP 119/60 02/10/21 1017  Temp    Pulse 94 02/10/21 1019  Resp 23 02/10/21 1019  SpO2 100 % 02/10/21 1019  Vitals shown include unvalidated device data.  Last Pain:  Vitals:   02/10/21 0635  TempSrc: Tympanic  PainSc: 7          Complications: No complications documented.

## 2021-02-10 NOTE — Anesthesia Postprocedure Evaluation (Signed)
Anesthesia Post Note  Patient: George [name]  Procedure(s) Performed: REVERSE SHOULDER ARTHROPLASTY (Left Shoulder)  Patient location during evaluation: PACU Anesthesia Type: General Level of consciousness: awake and alert Pain management: pain level controlled Vital Signs Assessment: post-procedure vital signs reviewed and stable Respiratory status: spontaneous breathing and respiratory function stable Cardiovascular status: stable Anesthetic complications: no   No complications documented.   Last Vitals:  Vitals:   02/10/21 1017 02/10/21 1030  BP: 119/60 (!) 109/56  Pulse: 86 87  Resp: (!) 21 17  Temp: 36.6 C   SpO2: 100% 97%    Last Pain:  Vitals:   02/10/21 1017  TempSrc:   PainSc: Asleep                 Lynne Takemoto K

## 2021-02-10 NOTE — Evaluation (Signed)
Physical Therapy Evaluation Patient Details Name: HERCULES HASLER MRN: [id number] DOB: [birth year] Today's Date: 02/10/2021   History of Present Illness  Patient is a 87 year old male with a history of left shoulder pain and weakness following a fall in December, 2021. His symptoms have persisted despite medications, activity modification. Symptoms were consistent with a massive rotator cuff tear which was confirmed by MRI scan. Patient presented today for reverse left total shoulder arthroplasty.   Clinical Impression  Patient agreeable to PT. Patient is very hard of hearing even with hearing aides in place. Patient has two caregivers at home and uses a single point cane for ambulation at baseline.  Patient was able to mobilize with assistance. Patient required Min guard to Min A for bed mobility and was able to maintain NWB of LUE during activity with shoulder immobilizer in place. Patient was able to stand x 2 bouts with Min A for assistance for lifting. Patient taking several steps along edge of bed with Min A. Patient fatigued quickly with activity with mild shortness of breath noted. Sp02 90% after standing on 2 L02. No increased pain in left shoulder reported with activity. Caregiver in the room for part of session. Recommend PT to maximize independence and progress functional mobility safely in preparation for discharge to home with caregiver support. HHPT recommended and patient is requesting [facility].     Follow Up Recommendations Home health PT;Supervision for mobility/OOB    Equipment Recommendations  None recommended by PT    Recommendations for Other Services       Precautions / Restrictions Precautions Precautions: Shoulder Shoulder Interventions: Shoulder abduction pillow;Shoulder sling/immobilizer (shoulder immobilizer with abduction pillow in place throughout session) Precaution Booklet Issued: No Required Braces or Orthoses:  (shoulder immobilizer with abduction  pillow in place) Restrictions Weight Bearing Restrictions: Yes LUE Weight Bearing: Non weight bearing      Mobility  Bed Mobility Overal bed mobility: Needs Assistance Bed Mobility: Supine to Sit;Sit to Supine     Supine to sit: Min guard;HOB elevated Sit to supine: Min assist;HOB elevated   General bed mobility comments: physical assistance required for LE support to return to bed. verbal cues for technique and patient able to maintain NWB of LUE without difficulty with shoulder immobilizer in place    Transfers Overall transfer level: Needs assistance Equipment used: None Transfers: Sit to/from Stand Sit to Stand: Min assist         General transfer comment: 2 bouts of standing performed. verbal cues for technique and safety for increased independence with mobility. patient is able to maintain NWB of LUE without difficulty. patient is fatigued with activity  Ambulation/Gait Ambulation/Gait assistance: Min assist Gait Distance (Feet): 2 Feet Assistive device: 1 person hand held assist (hand held assistance on the right)   Gait velocity: decreased   General Gait Details: patient able to take 3 small side steps along edge of bed with minimal assistance for steadying. verbal cues for safety. unable to progress mobility further as patient reports not feeling well, fatigued. Sp02 90% after standing with heart rate 100bpm and patient with mild shortness of breath noted  Stairs            Wheelchair Mobility    Modified Rankin (Stroke Patients Only)       Balance Overall balance assessment: Needs assistance;History of Falls Sitting-balance support: Feet supported;No upper extremity supported Sitting balance-Leahy Scale: Good     Standing balance support: Single extremity supported Standing balance-Leahy Scale: Fair Standing balance  comment: steadying assistance provided with standing                             Pertinent Vitals/Pain Pain  Assessment: Faces Faces Pain Scale: Hurts a little bit Pain Location: left shoulder Pain Descriptors / Indicators: Constant Pain Intervention(s): Premedicated before session (polar care re-applied at end of session)    Home Living Family/patient expects to be discharged to:: Private residence Living Arrangements: Alone (lives alone but has two caregivers) Available Help at Discharge: Personal care attendant;Available PRN/intermittently;Family Type of Home: House Home Access: Stairs to enter     Home Layout: Able to live on main level with bedroom/bathroom Home Equipment: Kasandra Knudsen - single point;Walker - 2 wheels;Bedside commode Additional Comments: information is per caregiver report (granddaughter)    Prior Function Level of Independence: Independent with assistive device(s);Needs assistance   Gait / Transfers Assistance Needed: Patient is independent with ambulation using a single point cane. patient reports his only fall was the fall that caused the shoulder injury, otherwise no falls reported  ADL's / Homemaking Assistance Needed: Patient has assistance with bathing from caregivers  Comments: patient has had home health services in the past from [place] and he would like this company for future services if possible     Hand Dominance   Dominant Hand: Right    Extremity/Trunk Assessment   Upper Extremity Assessment Upper Extremity Assessment: LUE deficits/detail LUE Deficits / Details: patient is able to activate hand and wrist movement observed with brace in place. did not assess elbow or shoulder ROM LUE: Unable to fully assess due to immobilization LUE Sensation:  (patient reports hand sensation intact)    Lower Extremity Assessment Lower Extremity Assessment:  (no focal weakness noted. endurance imparied for sustained activity in standing. no knee buckling observed with activity)       Communication   Communication: HOH (patient has hearing aides in place,  seems to hear better from the right ear)  Cognition Arousal/Alertness: Awake/alert Behavior During Therapy: WFL for tasks assessed/performed Overall Cognitive Status: Within Functional Limits for tasks assessed                                        General Comments      Exercises     Assessment/Plan    PT Assessment Patient needs continued PT services  PT Problem List Decreased strength;Decreased range of motion;Decreased mobility;Decreased safety awareness;Decreased knowledge of use of DME;Pain;Decreased balance;Decreased activity tolerance;Cardiopulmonary status limiting activity       PT Treatment Interventions DME instruction;Gait training;Stair training;Functional mobility training;Therapeutic activities;Therapeutic exercise;Balance training;Neuromuscular re-education    PT Goals (Current goals can be found in the Care Plan section)  Acute Rehab PT Goals Patient Stated Goal: to go home with caregiver support PT Goal Formulation: With patient/family Time For Goal Achievement: 02/24/21 Potential to Achieve Goals: Fair    Frequency BID   Barriers to discharge        Co-evaluation               AM-PAC PT "6 Clicks" Mobility  Outcome Measure Help needed turning from your back to your side while in a flat bed without using bedrails?: A Little Help needed moving from lying on your back to sitting on the side of a flat bed without using bedrails?: A Little Help needed moving to and from  a bed to a chair (including a wheelchair)?: A Little Help needed standing up from a chair using your arms (e.g., wheelchair or bedside chair)?: A Little Help needed to walk in hospital room?: A Little Help needed climbing 3-5 steps with a railing? : A Little 6 Click Score: 18    End of Session Equipment Utilized During Treatment: Oxygen (left shoulder immobilizer in place) Activity Tolerance: Patient limited by fatigue Patient left: in bed;with call bell/phone  within reach;with SCD's reapplied;with nursing/sitter in room;with bed alarm set (polar care re-applied to left shoulder) Nurse Communication: Mobility status PT Visit Diagnosis: Unsteadiness on feet (R26.81);Pain;Other abnormalities of gait and mobility (R26.89)    Time: 1358-1430 PT Time Calculation (min) (ACUTE ONLY): 32 min   Charges:   PT Evaluation $PT Eval Moderate Complexity: 1 Mod PT Treatments $Therapeutic Activity: 8-22 mins        Minna Merritts, PT, MPT   Percell Locus 02/10/2021, 2:52 PM

## 2021-02-10 NOTE — Op Note (Signed)
02/10/2021  10:12 AM  Patient:   George [name]  Pre-Op Diagnosis:   Traumatic massive irreparable rotator cuff tear, left shoulder.  Post-Op Diagnosis:   Same  Procedure:   Reverse left total shoulder arthroplasty.  Surgeon:   Pascal Lux, MD  Assistant:   Cameron Proud, PA-C; Marijean Bravo, PA-S  Anesthesia:   GET  Findings:   As above.  Complications:   None  EBL:    150 cc  Fluids:   1000 cc crystalloid  UOP:   None  TT:   None  Drains:   None  Closure:   Staples  Implants:   All press-fit Bi[employer] Comprehensive system with a #17 micro-humeral stem, a 40 mm +6 mm offset humeral tray with a standard insert, and a mini-base plate with a 40 mm glenosphere.  Brief Clinical Note:   The patient is an 87 year old male with a history of left shoulder pain and weakness following a fall in December, 2021. His symptoms have persisted despite medications, activity modification, etc. His history and examination are consistent with a massive rotator cuff tear which was confirmed by MRI scan. The patient presents at this time for a reverse left total shoulder arthroplasty.  Procedure:   The patient was brought into the operating room and lain in the supine position. The patient underwent general endotracheal intubation and anesthesia before the patient was repositioned in the beach chair position using the beach chair positioner. The left shoulder and upper extremity were prepped with ChloraPrep solution before being draped sterilely. Preoperative antibiotics were administered. A timeout was performed to verify the appropriate surgical site.    A standard anterior approach to the shoulder was made through an approximately 4-5 inch incision. The incision was carried down through the subcutaneous tissues to expose the deltopectoral fascia. The interval between the deltoid and pectoralis muscles was identified and this plane developed, retracting the cephalic vein laterally with the  deltoid muscle. The conjoined tendon was identified. Its lateral margin was dissected and the Kolbel self-retraining retractor inserted. The "three sisters" were identified and cauterized. Bursal tissues were removed to improve visualization. The subscapularis tendon was released from its attachment to the lesser tuberosity 1 cm proximal to its insertion and several tagging sutures placed. The inferior capsule was released with care after identifying and protecting the axillary nerve. The proximal humeral cut was made at approximately 25 of retroversion using the extra-medullary guide.   Attention was redirected to the glenoid. The labrum was debrided circumferentially before the center of the glenoid was marked with electrocautery. The guidewire was drilled into the glenoid neck using the appropriate guide. After verifying its position, it was overreamed with the mini-baseplate reamer to create a flat surface. The permanent mini-baseplate was impacted into place. It was stabilized with a 25 x 6.5 mm central screw and four peripheral screws. Locking screws were placed superiorly, posteriorly, and inferiorly while they nonlocking screw was placed anteriorly. The permanent 40 mm glenosphere was then impacted into place and its Morse taper locking mechanism verified using manual distraction.  Attention was directed to the humeral side. The humeral canal was reamed sequentially beginning with the end-cutting reamer then progressing from a 4 mm reamer up to a 17 mm reamer. This provided excellent circumferential chatter. The canal was broached beginning with a #14 broach and progressing to a #17 broach. This was left in place and several trial reductions were performed using the standard and +6 mm offset trial humeral platforms. Even with  the +6 mm offset platform, the shoulder was too tight to reduce. Therefore, the humeral stem was removed and an additional 5 mm proximal humerus removed using the oscillating saw.    The canal was re- broached with a #16 broach and then a #17 broach.  The #17 broach was left in place and the +6 mm offset platform with the +0 trial polyethylene was trialed. With this combination, the arm could be reduced with proper tension and demonstrated excellent range of motion as the hand could be brought across the chest to the opposite shoulder and brought to the top of the patient's head and to the patient's ear. The shoulder appeared stable throughout this range of motion. The joint was dislocated and the trial components removed. The permanent #17 micro-stem was impacted into place with care taken to maintain the appropriate version. The permanent 40 mm humeral platform with the standard insert was put together on the back table and impacted into place. Again, the [facility] taper locking mechanism was verified using manual distraction. The shoulder was relocated using two finger pressure and again placed through a range of motion with the findings as described above.  The wound was copiously irrigated with sterile saline solution using the jet lavage system before a total of 20 cc of Exparel diluted out to 60 cc with normal saline and 30 cc of 0.5% Sensorcaine with epinephrine was injected into the pericapsular and peri-incisional tissues to help with postoperative analgesia. The subscapularis tendon was reapproximated using #2 FiberWire interrupted sutures. The deltopectoral interval was closed using #0 Vicryl interrupted sutures before the subcutaneous tissues were closed using 2-0 Vicryl interrupted sutures. The skin was closed using staples. Prior to closing the skin, 1 g of transexemic acid in 10 cc of normal saline was injected intra-articularly to help with postoperative bleeding. A sterile occlusive dressing was applied to the wound before the arm was placed into a shoulder immobilizer with an abduction pillow. A Polar Care system also was applied to the shoulder. The patient was then  transferred back to a hospital bed before being awakened, extubated, and returned to the recovery room in satisfactory condition after tolerating the procedure well.

## 2021-02-10 NOTE — Anesthesia Procedure Notes (Signed)
Procedure Name: Intubation Date/Time: 02/10/2021 7:47 AM Performed by: Philbert Riser, CRNA Pre-anesthesia Checklist: Patient identified, Emergency Drugs available, Suction available and Patient being monitored Patient Re-evaluated:Patient Re-evaluated prior to induction Oxygen Delivery Method: Circle system utilized Preoxygenation: Pre-oxygenation with 100% oxygen Induction Type: IV induction Ventilation: Mask ventilation without difficulty Laryngoscope Size: McGraph and 4 Grade View: Grade I Tube type: Oral Tube size: 7.5 mm Number of attempts: 1 Airway Equipment and Method: Stylet and Oral airway Placement Confirmation: ETT inserted through vocal cords under direct vision,  positive ETCO2 and breath sounds checked- equal and bilateral Tube secured with: Tape Dental Injury: Teeth and Oropharynx as per pre-operative assessment

## 2021-02-10 NOTE — H&P (Signed)
History of Present Illness:  George [name] is a 87 y.o. male who presents today for evaluation of bilateral shoulder pain. The patient has been seen in the past and diagnosed with a left rotator cuff tear. The patient was scheduled to undergo a left reverse total shoulder arthroplasty however the patient was admitted to the hospital and because of the COVID-19 pandemic inpatient surgeries were canceled. The patient presents today with continued left shoulder discomfort and the inability to lift his arm. He also states that over the past several weeks without any trauma or injury he has been experiencing increased right shoulder discomfort as well. The patient denies any falls or trauma affecting the right shoulder. Patient presents today with a caregiver who also denies any injury or trauma to the right upper extremity. He is right-hand dominant. He is taken Tylenol as needed for discomfort at this time. He is still able to reach up and touch the top of his head and reach out to the side with his right arm, he has significant limited motion with the left arm. The patient is interested in rescheduling his left reverse total shoulder arthroplasty and would like to begin the process of rescheduling the surgery. He denies any numbness or tingling to bilateral upper extremites. No surgical history to either shoulder. Patient does have a history of COPD and has undergone a cardiac cath in the past.  Shoulder Surgical History:  The patient has had no shoulder surgery in the past.  PMH/PSH/Family History/Social History/Meds/Allergies:  I have reviewed past medical, surgical, social and family history, medications and allergies as documented in the EMR.  Current Outpatient Medications: . ADVAIR DISKUS 250-50 mcg/dose diskus inhaler INHALE 1 PUFF TWICE A DAY AS DIRECTED **RINSE MOUTH AFTER USE** 60 each 2  . alfuzosin (UROXATRAL) 10 mg ER tablet Take 10 mg by mouth once daily  . aspirin 81 MG EC tablet Take 81 mg  by mouth once daily.  . colchicine (COLCRYS) 0.6 mg tablet Take 0.6 mg by mouth 2 (two) times daily  . doxazosin (CARDURA) 4 MG tablet TAKE ONE TABLET AT BEDTIME 90 tablet 1  . finasteride (PROSCAR) 5 mg tablet Take 5 mg by mouth once daily  . HYDROcodone-acetaminophen (NORCO) 5-325 mg tablet Take 1 tablet by mouth every 6 (six) hours as needed 20 tablet 0  . ibuprofen (IBU-200 ORAL) Take 200 mg base by mouth every 6 (six) hours  . lisinopriL (ZESTRIL) 20 MG tablet TAKE ONE TABLET TWICE DAILY 180 tablet 1  . nitrofurantoin, macrocrystal-monohydrate, (MACROBID) 100 MG capsule Take 100 mg by mouth every 12 (twelve) hours  . omeprazole (PRILOSEC) 20 MG DR capsule TAKE 1 CAPSULE EVERY DAY 90 capsule 1  . sildenafil (REVATIO) 20 mg tablet Take 2 to 5 tablets PO PRN 240 tablet 1  . simvastatin (ZOCOR) 20 MG tablet TAKE 1/2 TABLET AT BEDTIME 45 tablet 1  . testosterone cypionate (DEPO-TESTOSTERONE) 200 mg/mL injection Inject 1 mL (200 mg total) into the muscle every 14 (fourteen) days 10 mL 2  . verapamiL (CALAN-SR) 240 MG SR tablet TAKE ONE TABLET BY MOUTH TWICE DAILY 180 tablet 1  . predniSONE (DELTASONE) 10 MG tablet Take 1 tablet (10 mg total) by mouth once daily 10 day taper, 5,5,4,4,3,3,2,2,1,1. Start with 5 tabs daily for 2 days then taper down 1 tab every 2 days. (Patient not taking: Reported on 12/05/2020 ) 30 tablet 0   Current Facility-Administered Medications: . cyanocobalamin (VITAMIN B12) injection 1,000 mcg 1,000 mcg Intramuscular Q30  Days Dennison Nancy, MD 1,000 mcg at 10/21/20  . testosterone cypionate (DEPO-TESTOSTERONE) 200 mg/mL injection 200 mg 200 mg Intramuscular Q14 Days Dennison Nancy, MD 200 mg at 10/21/20    Allergies: No Known Allergies  Past Medical History:  . CAD (coronary artery disease) December 1999  status post cardiac catheterization and PTCA of the distal LAD  . COPD (chronic obstructive pulmonary disease) (CMS-HCC)  . Diabetes mellitus type  2, uncomplicated (CMS-HCC)  . History of colon cancer  . Hx of adenomatous colonic polyps 09/18/2014  . Hyperlipidemia  . Hypertension  . Hypotestosteronemia  . Peyronie's disease  . Sleep apnea   Past Surgical History:  . AMPUTATION TOE Right (5th toe)  . CATARACT EXTRACTION Bilateral  . COLON SURGERY 1999 (partial colectomy)  . COLONOSCOPY 09/06/2011 (Adenomatous Polyps, [facility]: No repeat due to age per RTE (dw))  . COLONOSCOPY 05/19/2009 (Multiple Adenomatous Polyps, [facility])  . COLONOSCOPY 03/27/2007 (Adenomatous Polyp, [facility])  . COLONOSCOPY 03/10/2004 (Adenomatous Polyp, [facility])  . COLONOSCOPY 07/16/2002 ([facility], [place] Adenomatous Polyps)  . COLONOSCOPY 03/08/2001 ([facility], [place] Adenomatous Polyps)  . COLONOSCOPY 10/26/1999 (Adenomatous Polyp, [facility])  . COLONOSCOPY 09/08/1998 (Adenomatous Polyps, One w/Adenocarcinoma)  . COLONOSCOPY 11/04/2014 (Adenomatous Polyps, [facility]: No repeat due to age per RTE (dw))  . EGD 09/06/2011, 07/02/2011 (No repeat per RTE)  . HERNIA REPAIR Left (and orchidectomy as teenager)  . STENTS  . SURGERY FOR SLEEP APNEA   Family History: No family history on file.   Social History:   Socioeconomic History:  Marland Kitchen Marital status: Married  Spouse name: Not on file  . Number of children: Not on file  . Years of education: Not on file  . Highest education level: Not on file  Occupational History  . Not on file  Tobacco Use  . Smoking status: Current Every Day Smoker  Packs/day: 1.00  Years: 50.00  Pack years: 50.00  . Smokeless tobacco: Never Used  Vaping Use  . Vaping Use: Never used  Substance and Sexual Activity  . Alcohol use: Yes  Alcohol/week: 1.0 standard drink  Types: 1 Glasses of wine per week  Comment: wine every now and then  . Drug use: No  . Sexual activity: Yes  Other Topics Concern  . Not on file  Social History Narrative  Exercise: Very little.  Diet: Red Meat: 2 x per week, fast foods none, fried foods none   Social Determinants of Health:    Emergency planning/management officer Strain: Not on file  Food Insecurity: Not on file  Transportation Needs: Not on file   Review of Systems:  A comprehensive 14 point ROS was performed, reviewed, and the pertinent orthopaedic findings are documented in the HPI.  Physical Exam:  Vitals:  12/05/20 1401  BP: 100/62  Weight: 72.1 kg (159 lb)  Height: 170.2 cm (5\' 7" )  PainSc: 6  PainLoc: Shoulder   General/Constitutional: The patient appears to be well-nourished, well-developed, and in no acute distress. Neuro/Psych: Normal mood and affect, oriented to person, place and time. Eyes: Non-icteric. Pupils are equal, round, and reactive to light, and exhibit synchronous movement. ENT: Unremarkable. Lymphatic: No palpable adenopathy. Respiratory: Lungs clear to auscultation, Normal chest excursion, No wheezes and Non-labored breathing Cardiovascular: Regular rate and rhythm. No murmurs. and No edema, swelling or tenderness, except as noted in detailed exam. Integumentary: No impressive skin lesions present, except as noted in detailed exam. Musculoskeletal: Unremarkable, except as noted in detailed exam.  Left shoulder exam: SKIN: normal SWELLING: none WARMTH: none LYMPH  NODES: no adenopathy palpable CREPITUS: none TENDERNESS: Mildly tender over anterolateral shoulder ROM (active):  Forward flexion: 20 degrees Abduction: 10 degrees Internal rotation: L3 ROM (passive):  Forward flexion: 140 degrees Abduction: 130 degrees  ER/IR at 90 abd: 85 degrees / 50 degrees  STRENGTH: Forward flexion: 2/5 Abduction: 2/5 External rotation: 3/5 Internal rotation: 3-3+/5 Pain with RC testing: Moderate pain with attempted forward flexion or abduction  STABILITY: Normal  SPECIAL TESTS: Luan Pulling' test: positive, moderate Speed's test: Not evaluated Capsulitis - pain w/ passive ER: no Crossed arm test: Negative Crank: Not evaluated Anterior apprehension: Negative Posterior apprehension: Not  evaluated  He is neurovascularly intact to the left upper extremity.  Shoulder Imaging, MRI: Left Shoulder: MRI Shoulder Cartilage: No cartilage abnormality. MRI Shoulder Rotator Cuff: Full thickness tear of the supraspinatus and infraspinatus tendons. Retracted to the glenoid. MRI Shoulder Labrum / Biceps: Biceps tendinopathy. MRI Shoulder Bone: Normal bone.   True AP, Y-scapular, and axillary views of the right shoulder are obtained. These films demonstrate moderate degenerative changes. The subacromial space is mildly decreased. There is no subacromial or infra-clavicular spurring. He demonstrates a Type I acromion.  Assessment:  . Traumatic complete tear of left rotator cuff.   Plan:  1. Treatment options were discussed today with the patient and his caregiver. 2. Without a injury or trauma I believe that the patient has likely irritated the right rotator cuff due to having to perform all of his routine activities with the right arm. 3. The patient was offered and received a right subacromial steroid injection at today's visit. 4. The patient is interested in rescheduling his left reverse total shoulder arthroplasty. 5. Risk and benefits of the surgery were discussed in detail with the patient today's visit. Patient would like to proceed with a left reverse total shoulder arthroplasty. 6. This document will serve as a surgical history and physical for the patient. 7. The patient will follow-up per standard postop protocol. They can contact the clinic if they have any questions, new symptoms develop or symptoms worsen.  The procedure was discussed with the patient, as were the potential risks (including bleeding, infection, nerve and/or blood vessel injury, persistent or recurrent pain, dislocation, need for further surgery, blood clots, strokes, heart attacks and/or arhythmias, pneumonia, etc.) and benefits. The patient states his understanding and wishes to proceed.   H&P reviewed  and patient re-examined. No changes.

## 2021-02-10 NOTE — Plan of Care (Signed)

## 2021-02-11 ENCOUNTER — Encounter: Payer: Self-pay | Admitting: Surgery

## 2021-02-11 LAB — BASIC METABOLIC PANEL
Anion gap: 8 (ref 5–15)
BUN: 29 mg/dL — ABNORMAL HIGH (ref 8–23)
CO2: 22 mmol/L (ref 22–32)
Calcium: 8.3 mg/dL — ABNORMAL LOW (ref 8.9–10.3)
Chloride: 107 mmol/L (ref 98–111)
Creatinine, Ser: 1.85 mg/dL — ABNORMAL HIGH (ref 0.61–1.24)
GFR, Estimated: 35 mL/min — ABNORMAL LOW (ref >60)
Glucose, Bld: 146 mg/dL — ABNORMAL HIGH (ref 70–99)
Potassium: 6.3 mmol/L (ref 3.5–5.1)
Sodium: 137 mmol/L (ref 135–145)

## 2021-02-11 LAB — SURGICAL PATHOLOGY

## 2021-02-11 LAB — POTASSIUM
Potassium: 6 mmol/L — ABNORMAL HIGH (ref 3.5–5.1)
Potassium: 6 mmol/L — ABNORMAL HIGH (ref 3.5–5.1)
Potassium: 5.6 mmol/L — ABNORMAL HIGH (ref 3.5–5.1)

## 2021-02-11 MED ORDER — IPRATROPIUM-ALBUTEROL 0.5-2.5 (3) MG/3ML IN SOLN
3.0000 mL | Freq: Three times a day (TID) | RESPIRATORY_TRACT | Status: DC | PRN
  Filled 2021-02-11: qty 3

## 2021-02-11 MED ORDER — SODIUM POLYSTYRENE SULFONATE 15 GM/60ML PO SUSP
15.0000 g | ORAL | Status: AC
  Administered 2021-02-11 (×2): 15 g via ORAL
  Filled 2021-02-11 (×2): qty 60

## 2021-02-11 MED ORDER — ONDANSETRON HCL 4 MG PO TABS: 4.0000 mg | ORAL_TABLET | Freq: Four times a day (QID) | ORAL | 1 refills | Status: DC | PRN

## 2021-02-11 MED ORDER — HYDROCODONE-ACETAMINOPHEN 5-325 MG PO TABS: 1.0000 | ORAL_TABLET | ORAL | 0 refills | Status: DC | PRN

## 2021-02-11 MED ORDER — ASPIRIN EC 325 MG PO TBEC: 325.0000 mg | DELAYED_RELEASE_TABLET | Freq: Every day | ORAL | 0 refills | Status: DC

## 2021-02-11 MED ORDER — LISINOPRIL 20 MG PO TABS
20.0000 mg | ORAL_TABLET | Freq: Two times a day (BID) | ORAL | Status: DC
  Administered 2021-02-12: 20 mg via ORAL
  Filled 2021-02-11: qty 1

## 2021-02-11 NOTE — Progress Notes (Signed)
Physical Therapy Treatment Patient Details Name: George [name] MRN: [id number] DOB: 02/02/1932 Today's Date: 02/11/2021    History of Present Illness Patient is a 87 year old male with a history of left shoulder pain and weakness following a fall in December, 2021. His symptoms have persisted despite medications, activity modification. Symptoms were consistent with a massive rotator cuff tear which was confirmed by MRI scan. Patient presented today for reverse left total shoulder arthroplasty.    PT Comments    Patient received in bed, he is agreeable to getting up. Patient is anxious and impulsive with mobility. He is mod independent with bed mobility, cues needed to slow down, wait until PT ready. Patient attempting to get up with lines not organized. Patient requires min guard/min assist for sit to stand and ambulation a few feet to recliner. Patient will continue to benefit from skilled PT while here to promote mobility and safety.      Follow Up Recommendations  Home health PT;Supervision for mobility/OOB;Supervision/Assistance - 24 hour     Equipment Recommendations  None recommended by PT    Recommendations for Other Services       Precautions / Restrictions Precautions Precautions: Shoulder;Fall Shoulder Interventions: Shoulder sling/immobilizer;Shoulder abduction pillow Precaution Booklet Issued: No Restrictions Weight Bearing Restrictions: Yes LUE Weight Bearing: Non weight bearing    Mobility  Bed Mobility Overal bed mobility: Modified Independent Bed Mobility: Supine to Sit     Supine to sit: HOB elevated;Modified independent (Device/Increase time)     General bed mobility comments: patient got to edge of bed without physical assist. He is impulsive and requires multimodal cues to wait until we are ready to assist him.    Transfers Overall transfer level: Needs assistance Equipment used: Quad cane Transfers: Sit to/from Stand Sit to Stand: Min assist          General transfer comment: Patient requires cga for sit to stand transfers  Ambulation/Gait Ambulation/Gait assistance: Min guard Gait Distance (Feet): 3 Feet Assistive device: Quad cane Gait Pattern/deviations: Step-to pattern;Decreased step length - right;Decreased step length - left Gait velocity: decreased   General Gait Details: Patient ambulated from bed to recliner. BP low this am, so limited gait distance. Patient is very [place] and impulsive with mobility. He changed his hearing aide batteries which should help hearing.  Patient wheezing with mobility, O2 sats off [place] remained in mid 90%s despite wheezing.   Stairs             Wheelchair Mobility    Modified Rankin (Stroke Patients Only)       Balance Overall balance assessment: Needs assistance;History of Falls Sitting-balance support: Feet supported;No upper extremity supported Sitting balance-Leahy Scale: Good     Standing balance support: Single extremity supported;During functional activity   Standing balance comment: steadying assistance provided with standing                            Cognition Arousal/Alertness: Awake/alert Behavior During Therapy: Anxious;Restless;Impulsive Overall Cognitive Status: Within Functional Limits for tasks assessed                                        Exercises      General Comments        Pertinent Vitals/Pain Pain Assessment: Faces Faces Pain Scale: Hurts little more Pain Location: left shoulder Pain Descriptors / Indicators: Constant;Discomfort Pain  Intervention(s): Monitored during session;Repositioned    Home Living                      Prior Function            PT Goals (current goals can now be found in the care plan section) Acute Rehab PT Goals Patient Stated Goal: to go home with caregiver support PT Goal Formulation: With patient Time For Goal Achievement: 02/24/21 Potential to Achieve Goals:  Fair Progress towards PT goals: Progressing toward goals    Frequency    7X/week      PT Plan Frequency needs to be updated    Co-evaluation              AM-PAC PT "6 Clicks" Mobility   Outcome Measure  Help needed turning from your back to your side while in a flat bed without using bedrails?: None Help needed moving from lying on your back to sitting on the side of a flat bed without using bedrails?: None Help needed moving to and from a bed to a chair (including a wheelchair)?: A Little Help needed standing up from a chair using your arms (e.g., wheelchair or bedside chair)?: A Little Help needed to walk in hospital room?: A Little Help needed climbing 3-5 steps with a railing? : A Lot 6 Click Score: 19    End of Session Equipment Utilized During Treatment: Oxygen Activity Tolerance: Patient limited by fatigue;Patient limited by pain Patient left: in chair;with call bell/phone within reach;with chair alarm set Nurse Communication: Mobility status;Other (comment) (removed supplemental O2) PT Visit Diagnosis: Unsteadiness on feet (R26.81);Pain;Other abnormalities of gait and mobility (R26.89) Pain - Right/Left: Left Pain - part of body: Arm     Time: 7017-7939 PT Time Calculation (min) (ACUTE ONLY): 20 min  Charges:  $Therapeutic Activity: 8-22 mins                     Konnie Noffsinger, PT, GCS 02/11/21,10:47 AM

## 2021-02-11 NOTE — Evaluation (Signed)
Occupational Therapy Evaluation Patient Details Name: George [name] MRN: [id number] DOB: [birth year] Today's Date: 02/11/2021    History of Present Illness Patient is a 86 year old male with a history of left shoulder pain and weakness following a fall in December, 2021. His symptoms have persisted despite medications, activity modification. Symptoms were consistent with a massive rotator cuff tear which was confirmed by MRI scan. Patient presented today for reverse left total shoulder arthroplasty.   Clinical Impression   Patient presenting with decreased I in self care, balance, functional mobility/transfers, endurance, and decreased understanding of precautions.  Patient lives at home and reports having 2 caregivers assist him PTA. One of caregivers does specifically help pt with self care tasks.  Patient currently functioning at min guard - min A for ambulation into bathroom with pt reporting urgency for BM. Pt able to perform hygiene with assistance for clothing management. OT reviewed polar care and sling use. OT repositioned sling. OT discussed NWB restriction and only performing hand,wrist, and elbow exercises. Handouts left in room for family to review when they visit tonight. Pt needs further education on these topics with plans to further review with family tomorrow. Patient will benefit from acute OT to increase overall independence in the areas of ADLs, functional mobility, and safety awareness in order to safely discharge home.    Follow Up Recommendations  Follow surgeon's recommendation for DC plan and follow-up therapies;Supervision/Assistance - 24 hour    Equipment Recommendations  3 in 1 bedside commode       Precautions / Restrictions Precautions Precautions: Shoulder;Fall Shoulder Interventions: Shoulder sling/immobilizer;Shoulder abduction pillow Precaution Booklet Issued: No Restrictions Weight Bearing Restrictions: Yes LUE Weight Bearing: Non weight bearing       Mobility Bed Mobility Overal bed mobility: Needs Assistance Bed Mobility: Supine to Sit     Supine to sit: HOB elevated;Supervision Sit to supine: Min assist;HOB elevated   General bed mobility comments: min cuing for technique in order to remain NWB    Transfers Overall transfer level: Needs assistance Equipment used: 1 person hand held assist Transfers: Sit to/from Stand Sit to Stand: Min assist         General transfer comment: Patient requires cga for sit to stand transfers    Balance Overall balance assessment: Needs assistance;History of Falls Sitting-balance support: Feet supported;No upper extremity supported Sitting balance-Leahy Scale: Good     Standing balance support: Single extremity supported;During functional activity Standing balance-Leahy Scale: Fair Standing balance comment: steadying assistance provided with standing                           ADL either performed or assessed with clinical judgement   ADL Overall ADL's : Needs assistance/impaired                         Toilet Transfer: Min guard;Ambulation Toilet Transfer Details (indicate cue type and reason): no AD utilized Toileting- Water quality scientist and Hygiene: Minimal assistance;Sit to/from stand               Vision Patient Visual Report: No change from baseline              Pertinent Vitals/Pain Pain Assessment: Faces Faces Pain Scale: Hurts a little bit Pain Location: left shoulder Pain Descriptors / Indicators: Discomfort Pain Intervention(s): Limited activity within patient's tolerance;Monitored during session;Repositioned     Hand Dominance Right   Extremity/Trunk Assessment Upper Extremity Assessment LUE Deficits /  Details: Pt able to demonstrate hand, wrist, elbow exercises. Reviewed several times NWB precautions.   Lower Extremity Assessment Lower Extremity Assessment: Defer to PT evaluation       Communication  Communication Communication: HOH   Cognition Arousal/Alertness: Awake/alert Behavior During Therapy: Anxious;Restless Overall Cognitive Status: Within Functional Limits for tasks assessed                                 General Comments: Pt very hard of hearing. Education explained several ways during session and pt given min cues for safety.              Home Living Family/patient expects to be discharged to:: Private residence Living Arrangements: Alone Available Help at Discharge: Personal care attendant;Available PRN/intermittently;Family Type of Home: House Home Access: Stairs to enter [employer] of Steps: 4 Entrance Stairs-Rails: Can reach both Home Layout: Able to live on main level with bedroom/bathroom               Home Equipment: Cane - single point;Walker - 2 wheels;Bedside commode          Prior Functioning/Environment Level of Independence: Independent with assistive device(s);Needs assistance  Gait / Transfers Assistance Needed: Patient is independent with ambulation using a single point cane. patient reports his only fall was the fall that caused the shoulder injury, otherwise no falls reported ADL's / Homemaking Assistance Needed: Patient has assistance with bathing from caregivers            OT Problem List: Decreased strength;Impaired balance (sitting and/or standing);Decreased knowledge of precautions;Decreased range of motion;Decreased activity tolerance;Decreased coordination;Decreased knowledge of use of DME or AE;Impaired UE functional use;Decreased safety awareness      OT Treatment/Interventions: Self-care/ADL training;Manual therapy;Therapeutic exercise;Patient/family education;Neuromuscular education;Balance training;Therapeutic activities;DME and/or AE instruction;Cognitive remediation/compensation;Energy conservation    OT Goals(Current goals can be found in the care plan section) Acute Rehab OT Goals Patient  Stated Goal: to go home with caregiver support OT Goal Formulation: With patient Time For Goal Achievement: 02/25/21 Potential to Achieve Goals: Good ADL Goals Pt Will Perform Grooming: with supervision;standing Pt Will Perform Upper Body Bathing: with min assist Pt Will Transfer to Toilet: with supervision Pt Will Perform Toileting - Clothing Manipulation and hygiene: with supervision Pt/caregiver will Perform Home Exercise Program: Left upper extremity;With written HEP provided;With Supervision  OT Frequency: Min 2X/week   Barriers to D/C:    none known at this time          AM-PAC OT "6 Clicks" Daily Activity     Outcome Measure Help from another person eating meals?: A Little Help from another person taking care of personal grooming?: A Little Help from another person toileting, which includes using toliet, bedpan, or urinal?: A Little Help from another person bathing (including washing, rinsing, drying)?: A Little Help from another person to put on and taking off regular upper body clothing?: A Lot Help from another person to put on and taking off regular lower body clothing?: A Lot 6 Click Score: 16   End of Session Equipment Utilized During Treatment: Other (comment) (shoulder immobilizer sling) Nurse Communication: Mobility status  Activity Tolerance: Patient tolerated treatment well Patient left: in bed;with call bell/phone within reach;with bed alarm set  OT Visit Diagnosis: Unsteadiness on feet (R26.81);Muscle weakness (generalized) (M62.81);History of falling (Z91.81);Pain Pain - Right/Left: Left Pain - part of body: Shoulder  Time: 0165-5374 OT Time Calculation (min): 31 min Charges:  OT General Charges $OT Visit: 1 Visit OT Evaluation $OT Eval Moderate Complexity: 1 Mod OT Treatments $Self Care/Home Management : 23-37 mins  Darleen Crocker, MS, OTR/L , CBIS ascom [phone]  02/11/21, 3:54 PM

## 2021-02-11 NOTE — TOC Progression Note (Signed)
Transition of Care [facility]) - Progression Note    Patient Details  Name: George [name] MRN: [id number] Date of Birth: 12/26/1931  [facility]) CM/SW [place], RN Phone Number: 02/11/2021, 11:24 AM  Clinical Narrative:   Damaris Schooner with Zack from [employer] and notified him that the patient will need O2, it will be brought to the room         Expected Discharge Plan and Services                                                 Social Determinants of Health (SDOH) Interventions    Readmission Risk Interventions No flowsheet data found.

## 2021-02-11 NOTE — Plan of Care (Signed)
  Problem: Education: Goal: Knowledge of General Education information will improve Description: Including pain rating scale, medication(s)/side effects and non-pharmacologic comfort measures Outcome: Progressing   Problem: Health Behavior/Discharge Planning: Goal: Ability to manage health-related needs will improve Outcome: Progressing   Problem: Clinical Measurements: Goal: Ability to maintain clinical measurements within normal limits will improve Outcome: Progressing Goal: Will remain free from infection Outcome: Progressing Goal: Diagnostic test results will improve Outcome: Progressing Goal: Respiratory complications will improve Outcome: Progressing Goal: Cardiovascular complication will be avoided Outcome: Progressing   Problem: Activity: Goal: Risk for activity intolerance will decrease Outcome: Progressing   Problem: Nutrition: Goal: Adequate nutrition will be maintained Outcome: Progressing   Problem: Coping: Goal: Level of anxiety will decrease Outcome: Progressing   Problem: Elimination: Goal: Will not experience complications related to bowel motility Outcome: Progressing Goal: Will not experience complications related to urinary retention Outcome: Progressing   Problem: Pain Managment: Goal: General experience of comfort will improve Outcome: Progressing   Problem: Safety: Goal: Ability to remain free from injury will improve Outcome: Progressing   Problem: Education: Goal: Knowledge of the prescribed therapeutic regimen will improve Outcome: Progressing Goal: Understanding of activity limitations/precautions following surgery will improve Outcome: Progressing Goal: Individualized Educational Video(s) Outcome: Progressing

## 2021-02-11 NOTE — Progress Notes (Signed)
   02/11/21 5110  Provider Notification  Provider Name/Title Sidney Ace, MD  Date Provider Notified 02/11/21  Time Provider Notified [phone]  Notification Type Page  Notification Reason Critical result

## 2021-02-11 NOTE — Progress Notes (Addendum)
  Subjective: 1 Day Post-Op Procedure(s) (LRB): REVERSE SHOULDER ARTHROPLASTY (Left) Patient reports pain as mild.   Patient is well but is hypoxic, requiring 3L of O2 at this time. Patient has chronic history of SOB especially with exertion.  States it has been worsening in the last several months. PT and care management to assis with d/c planning. Negative for chest pain does report chronic SOB. Fever: no Gastrointestinal:Negative for nausea and vomiting  Objective: Vital signs in last 24 hours: Temp:  [97.4 F (36.3 C)-98.4 F (36.9 C)] 98.2 F (36.8 C) (05/04 0533) Pulse Rate:  [72-96] 82 (05/04 0533) Resp:  [14-21] 17 (05/04 0533) BP: (109-157)/(54-85) 129/64 (05/04 0533) SpO2:  [94 %-100 %] 97 % (05/04 0533)  Intake/Output from previous day:  Intake/Output Summary (Last 24 hours) at 02/11/2021 0750 Last data filed at 02/11/2021 0533 Gross per 24 hour  Intake 1740 ml  Output 700 ml  Net 1040 ml    Intake/Output this shift: No intake/output data recorded.  Labs: No results for input(s): HGB in the last 72 hours. No results for input(s): WBC, RBC, HCT, PLT in the last 72 hours. Recent Labs    02/11/21 0517 02/11/21 0715  NA 137  --   K 6.3* 6.0*  CL 107  --   CO2 22  --   BUN 29*  --   CREATININE 1.85*  --   GLUCOSE 146*  --   CALCIUM 8.3*  --    No results for input(s): LABPT, INR in the last 72 hours.  EXAM General - Patient is Alert, Appropriate and Oriented  Moderate wheeze can be heard without auscultation.  Wheeze heard throughout lung fields. Extremity - ABD soft  Intact to light touch to the left arm. Incision clean without any drainage, honeycomb intact. Able to flex and extend fingers without pain. Intact bowel sounds.  Past Medical History:  Diagnosis Date  . Anemia   . Arthritis   . BPH (benign prostatic hyperplasia)   . Cancer (HCC)    COLON  . COPD (chronic obstructive pulmonary disease) ([place])   . Coronary artery disease    WITH 1  STENT  . Diabetes mellitus without complication ([place])   . Dyspnea    WITH EXERTION ONLY  . GERD (gastroesophageal reflux disease)   . Hypercholesteremia   . Hypertension   . Pneumonia   . Sleep apnea    HAD UPPP    Assessment/Plan: 1 Day Post-Op Procedure(s) (LRB): REVERSE SHOULDER ARTHROPLASTY (Left) Active Problems:   Status post reverse arthroplasty of shoulder, left SOB with exertion. COPD CAD Sleep Apnea  Estimated body mass index is 23.66 kg/m as calculated from the following:   Height as of 02/02/21: 5\' 10"  (1.778 m).   Weight as of 02/02/21: 74.8 kg. Advance diet Up with therapy D/C IV fluids when tolerating po intake.  Labs reviewed, K+ 6.0 this AM.  Kayexalate ordered, recheck potassium this afternoon. Patient with moderate wheeze this AM, reports chronic SOB.  Currently on 3L of O2, may need to discharge home with home O2.  Nebulizer treatments ordered as needed. Nebulizer treatments as needed today. Up with therapy today.  Begin working on BM. Pending progress with PT, resolution of Hyperkalemia may possibly d/c home today with HHPT.  DVT Prophylaxis - Lovenox, Foot Pumps and TED hose Non-weightbearing to the left arm.  Raquel Tydus Sanmiguel, PA-C [facility] 02/11/2021, 7:50 AM

## 2021-02-11 NOTE — Discharge Summary (Signed)
Physician Discharge Summary  Patient ID: George [name] MRN: [id number] DOB/AGE: 1931-11-22 87 y.o.  Admit date: 02/10/2021 Discharge date: Feb 12, 2021  Admission Diagnoses:  Status post reverse arthroplasty of shoulder, left [Z96.612]  Discharge Diagnoses: Patient Active Problem List   Diagnosis Date Noted  . Status post reverse arthroplasty of shoulder, left 02/10/2021  . Acute kidney injury superimposed on CKD IIIb ([place]) 11/04/2020  . BPH with obstruction/lower urinary tract symptoms 11/04/2020  . Hyperkalemia 11/04/2020  . Acute urinary retention 11/04/2020  . Community acquired pneumonia 06/25/2019  . Pneumonia 06/25/2019  . Foot pain 07/11/2018  . Urinary urgency 06/27/2017  . Erectile dysfunction 06/27/2017  . Stage 3 acute kidney injury ([place]) 06/27/2017  . Hypogonadism in male 06/27/2017  . Vomiting 02/09/2016  . Slurred speech 02/09/2016  . Nausea and vomiting 02/09/2016  . Lower abdominal pain 02/09/2016  . Tobacco abuse 02/09/2016  . Essential hypertension 02/09/2016  . GERD (gastroesophageal reflux disease) 02/09/2016  . Hyperlipidemia 04/22/2014  . Hypertension 04/22/2014  . COPD (chronic obstructive pulmonary disease) ([place]) 04/22/2014  . Diabetes mellitus type 2, uncomplicated ([place]) 19/50/9326  . CAD (coronary artery disease) 09/10/1998    Past Medical History:  Diagnosis Date  . Anemia   . Arthritis   . BPH (benign prostatic hyperplasia)   . Cancer (HCC)    COLON  . COPD (chronic obstructive pulmonary disease) ([place])   . Coronary artery disease    WITH 1 STENT  . Diabetes mellitus without complication ([place])   . Dyspnea    WITH EXERTION ONLY  . GERD (gastroesophageal reflux disease)   . Hypercholesteremia   . Hypertension   . Pneumonia   . Sleep apnea    HAD UPPP     Transfusion: None.   Consultants (if any):   Discharged Condition: Improved  Hospital Course: George [name] is an 87 y.o. 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    Surgeries: Procedure(s): REVERSE SHOULDER ARTHROPLASTY on 02/10/2021 Patient tolerated the surgery well. Taken to PACU where she was stabilized and then transferred to the orthopedic floor.  Started on Lovenox 40mg  q 24 hrs. Foot pumps applied bilaterally at 80 mm. Heels elevated on bed with rolled towels. No evidence of DVT. Negative [place]. Physical therapy started on day #1 for gait training and transfer. OT started day #1 for ADL and assisted devices.  Patient's IV was removed on Feb 11, 2021  On POD1 potassium levels for the patient was 6.0.  Treated with Kayexalate with resolution of his hyperkalemia.  The potassium level dropped to 5.2 before discharge.  Implants: All press-fit [employer] Comprehensive system with a #17 micro-humeral stem, a 40 mm +6 mm offset humeral tray with a standard insert, and a mini-base plate with a 40 mm glenosphere.  He was given perioperative antibiotics:  Anti-infectives (From admission, onward)   Start     Dose/Rate Route Frequency Ordered Stop   02/10/21 2200  nitrofurantoin (macrocrystal-monohydrate) (MACROBID) capsule 100 mg        100 mg Oral Every 12 hours 02/10/21 1201     02/10/21 1400  ceFAZolin (ANCEF) IVPB 2g/100 mL premix        2 g 200 mL/hr over 30 Minutes Intravenous Every 6 hours 02/10/21 1201 02/11/21 0417   02/10/21 0625  ceFAZolin (ANCEF) 2-4 GM/100ML-% IVPB  Note to Pharmacy: George [name]   : cabinet override      02/10/21 0625 02/10/21 0742   02/10/21 0600  ceFAZolin (ANCEF) IVPB 2g/100 mL premix        2 g 200 mL/hr over 30 Minutes Intravenous On call to O.R. 02/10/21 6269 02/10/21 0740    .  He was given sequential compression devices, early ambulation, and Lovenox for DVT prophylaxis.  He benefited maximally from the hospital stay and there were no complications.     Recent vital signs:  Vitals:   02/12/21 0043 02/12/21 0453  BP: (!) 164/77 (!) 175/88  Pulse: 94 88  Resp: 20 19  Temp: 98.3 F (36.8 C) 97.8 F (36.6 C)  SpO2: 99% 100%    Recent laboratory studies:  Lab Results  Component Value Date   HGB 14.3 02/02/2021   HGB 12.9 (L) 11/27/2020   HGB 12.1 (L) 11/09/2020   Lab Results  Component Value Date   WBC 9.5 02/02/2021   PLT 275 02/02/2021   Lab Results  Component Value Date   INR 1.17 02/08/2016   Lab Results  Component Value Date   NA 139 02/12/2021   K 5.2 (H) 02/12/2021   CL 106 02/12/2021   CO2 26 02/12/2021   BUN 33 (H) 02/12/2021   CREATININE 1.51 (H) 02/12/2021   GLUCOSE 71 02/12/2021    Discharge Medications:   Allergies as of 02/12/2021   No Known Allergies     Medication List    TAKE these medications   alfuzosin 10 MG 24 hr tablet Commonly known as: UROXATRAL Take 1 tablet (10 mg total) by mouth daily with breakfast. This replaces doxazosin   aspirin EC 325 MG tablet Take 1 tablet (325 mg total) by mouth daily. What changed:   medication strength  how much to take   cyanocobalamin 1000 MCG/ML injection Commonly known as: (VITAMIN B-12) Inject 1,000 mcg into the muscle every 30 (thirty) days.   finasteride 5 MG tablet Commonly known as: PROSCAR Take 1 tablet (5 mg total) by mouth daily.   Fluticasone-Salmeterol 250-50 MCG/DOSE Aepb Commonly known as: ADVAIR Inhale 1 puff into the lungs 2 (two) times daily.   HYDROcodone-acetaminophen 5-325 MG tablet Commonly known as: NORCO/VICODIN Take 1-2 tablets by mouth every 4 (four) hours as needed for moderate pain (pain score 4-6).   hyoscyamine 0.125 MG tablet Commonly known as: Levsin Take 1 tablet (0.125 mg total) by mouth every 4 (four) hours as needed.   ibuprofen 200 MG tablet Commonly known as: ADVIL Take 200 mg by mouth every 6 (six) hours as needed.   lisinopril 20 MG tablet Commonly known as: ZESTRIL Take 20 mg by mouth 2  (two) times daily.   nitrofurantoin (macrocrystal-monohydrate) 100 MG capsule Commonly known as: MACROBID Take 1 capsule (100 mg total) by mouth every 12 (twelve) hours.   omeprazole 20 MG capsule Commonly known as: PRILOSEC Take 20 mg by mouth daily.   ondansetron 4 MG tablet Commonly known as: ZOFRAN Take 1 tablet (4 mg total) by mouth every 6 (six) hours as needed for nausea.   sildenafil 20 MG tablet Commonly known as: REVATIO Take 40-100 mg by mouth as needed (for ED).   simvastatin 20 MG tablet Commonly known as: ZOCOR Take 10 mg by mouth at bedtime.   testosterone cypionate 200 MG/ML injection Commonly known as: DEPOTESTOSTERONE CYPIONATE Inject 200 mg into the muscle every 14 (fourteen) days.   verapamil 240 MG CR tablet Commonly known as:  CALAN-SR Take 240 mg by mouth 2 (two) times daily.            Durable Medical Equipment  (From admission, onward)         Start     Ordered   02/11/21 1544  For home use only DME oxygen  Once       Question Answer Comment  Length of Need 6 Months   Mode or (Route) Nasal cannula   Liters per Minute 2   Frequency Continuous (stationary and portable oxygen unit needed)   Oxygen delivery system Gas      02/11/21 1543          Diagnostic Studies: DG Shoulder Left Port  Result Date: 02/10/2021 CLINICAL DATA:  Status post total shoulder replacement EXAM: LEFT SHOULDER: 2 V COMPARISON:  October 05, 2020 FINDINGS: Frontal and lateral views obtained. There is a total shoulder replacement on the left with prosthetic components well-seated. No acute fracture or dislocation. Narrowing acromioclavicular joint noted. Air noted within the joint. There are overlying skin staples. Visualized left lung clear. IMPRESSION: Total shoulder replacement the left with prosthetic components well-seated. Acute postoperative changes noted. No fracture. Moderate arthropathy in the acromioclavicular joint noted. Electronically Signed   By: Lowella Grip III M.D.   On: 02/10/2021 12:25   Disposition: Discharge disposition: 01-Home or Self Care     Plan for discharge home with home health on Feb 12, 2021   Follow-up Information    Lattie Corns, PA-C Follow up in 14 day(s).   Specialty: Physician Assistant Why: Electa Sniff information: [place] [address] [phone]                Signed: Prescott Parma, Welford Christmas PA-C 02/12/2021, 7:13 AM

## 2021-02-11 NOTE — Discharge Instructions (Signed)
Diet: As you were doing prior to hospitalization  ° °Shower:  May shower but keep the wounds dry, use an occlusive plastic wrap, NO SOAKING IN TUB.  If the bandage gets wet, change with a clean dry gauze. ° °Dressing:  You may change your dressing as needed. Change the dressing with sterile gauze dressing.   ° °Activity:  Increase activity slowly as tolerated, but follow the weight bearing instructions below.  No lifting or driving for 6 weeks. ° °Weight Bearing:   Non-weightbearing to the left arm. ° °To prevent constipation: you may use a stool softener such as - ° °Colace (over the counter) 100 mg by mouth twice a day  °Drink plenty of fluids (prune juice may be helpful) and high fiber foods °Miralax (over the counter) for constipation as needed.   ° °Itching:  If you experience itching with your medications, try taking only a single pain pill, or even half a pain pill at a time.  You may take up to 10 pain pills per day, and you can also use benadryl over the counter for itching or also to help with sleep.  ° °Precautions:  If you experience chest pain or shortness of breath - call 911 immediately for transfer to the hospital emergency department!! ° °If you develop a fever greater that 101 F, purulent drainage from wound, increased redness or drainage from wound, or calf pain-Call [facility]                                              °Follow- Up Appointment:  Please call for an appointment to be seen in 2 weeks at [facility] °

## 2021-02-12 LAB — BASIC METABOLIC PANEL
Anion gap: 7 (ref 5–15)
BUN: 33 mg/dL — ABNORMAL HIGH (ref 8–23)
CO2: 26 mmol/L (ref 22–32)
Calcium: 8.5 mg/dL — ABNORMAL LOW (ref 8.9–10.3)
Chloride: 106 mmol/L (ref 98–111)
Creatinine, Ser: 1.51 mg/dL — ABNORMAL HIGH (ref 0.61–1.24)
GFR, Estimated: 44 mL/min — ABNORMAL LOW (ref >60)
Glucose, Bld: 71 mg/dL (ref 70–99)
Potassium: 5.2 mmol/L — ABNORMAL HIGH (ref 3.5–5.1)
Sodium: 139 mmol/L (ref 135–145)

## 2021-02-12 MED ORDER — SODIUM POLYSTYRENE SULFONATE 15 GM/60ML PO SUSP
15.0000 g | Freq: Once | ORAL | Status: AC
  Administered 2021-02-12: 15 g via ORAL
  Filled 2021-02-12: qty 60

## 2021-02-12 NOTE — Progress Notes (Signed)
Physical Therapy Treatment Patient Details Name: George [name] MRN: [id number] DOB: 1931-11-21 Today's Date: 02/12/2021    History of Present Illness Patient is a 87 year old male with a history of left shoulder pain and weakness following a fall in December, 2021. His symptoms have persisted despite medications, activity modification. Symptoms were consistent with a massive rotator cuff tear which was confirmed by MRI scan. Patient presented today for reverse left total shoulder arthroplasty.    PT Comments    Patient is making progress with functional independence and with increased activity tolerance this session. At rest, Sp02 was 96% on 2 L 02. With ambulation, patient has increased work of breathing and fatigue with Sp02 93% after walking with the 2 L02. Patient progressed to ambulating in hallway, 137ft with quad cane. Patient is a limited household ambulator at baseline.  Stair training completed and patient able to go up 4 steps with right side railing which he has at home. Patient able to maintain NWB of LUE with all mobility with emphasis on importance of NWB. Discharge recommendations remain appropriate. Patient anticipated to discharge home today, recommend to resume PT at home.     Follow Up Recommendations  Home health PT;Supervision for mobility/OOB;Supervision/Assistance - 24 hour     Equipment Recommendations  None recommended by PT    Recommendations for Other Services       Precautions / Restrictions Precautions Precautions: Shoulder;Fall Shoulder Interventions: Shoulder sling/immobilizer;Shoulder abduction pillow Precaution Booklet Issued: No Restrictions Weight Bearing Restrictions: Yes LUE Weight Bearing: Non weight bearing    Mobility  Bed Mobility Overal bed mobility: Needs Assistance Bed Mobility: Supine to Sit     Supine to sit: HOB elevated;Supervision     General bed mobility comments: supervision for safety. verbal cues for technique and patient  is able to maintain NWB of LUE during functional mobility with shoulder immobilizer in place    Transfers Overall transfer level: Needs assistance Equipment used: None Transfers: Sit to/from Stand Sit to Stand: Min guard         General transfer comment: Min guard for safety. multiple bouts of standing performed from various surfaces including bed and recliner chair. patient is able to maintain NWB of LUE with transfers  Ambulation/Gait Ambulation/Gait assistance: Min guard Gait Distance (Feet): 120 Feet Assistive device: Quad cane (used in RUE) Gait Pattern/deviations: Step-to pattern;Step-through pattern Gait velocity: decreased   General Gait Details: patient ambulated in hallway with Min guard assistance for safety and occasional cues for decreased cadence for safety. patient ambulated with 2 L02 with Sp02 94% after walking with increased work of breathing and shortness of breath noted.   Stairs Stairs: Yes Stairs assistance: Min guard Stair Management: One rail Right;Alternating pattern;Step to pattern Number of Stairs: 4 General stair comments: patient used railing on right with step through pattern. verbal cues for step to pattern going down the steps for safety. patient able to maintain NWB of LUE without difficulty. patient was fatigued after activity. patient verbalized pursed lip breathing technique and was able to demonstrate correct technique   Wheelchair Mobility    Modified Rankin (Stroke Patients Only)       Balance                                            Cognition Arousal/Alertness: Awake/alert Behavior During Therapy: WFL for tasks assessed/performed Overall Cognitive Status: Within Functional Limits  for tasks assessed                                 General Comments: Patient is very hard of hearing and requires repetition occasionally      Exercises      General Comments        Pertinent Vitals/Pain Pain  Assessment: Faces Faces Pain Scale: Hurts a little bit Pain Location: left shoulder Pain Descriptors / Indicators: Discomfort Pain Intervention(s): Limited activity within patient's tolerance    Home Living                      Prior Function            PT Goals (current goals can now be found in the care plan section) Acute Rehab PT Goals Patient Stated Goal: to go home with caregiver support PT Goal Formulation: With patient Time For Goal Achievement: 02/24/21 Potential to Achieve Goals: Fair Progress towards PT goals: Progressing toward goals    Frequency    7X/week      PT Plan Current plan remains appropriate    Co-evaluation              AM-PAC PT "6 Clicks" Mobility   Outcome Measure  Help needed turning from your back to your side while in a flat bed without using bedrails?: None Help needed moving from lying on your back to sitting on the side of a flat bed without using bedrails?: None Help needed moving to and from a bed to a chair (including a wheelchair)?: A Little Help needed standing up from a chair using your arms (e.g., wheelchair or bedside chair)?: A Little Help needed to walk in hospital room?: A Little Help needed climbing 3-5 steps with a railing? : A Little 6 Click Score: 20    End of Session Equipment Utilized During Treatment: Oxygen;Gait belt; shoulder immobilizer  Activity Tolerance: Patient tolerated treatment well;Patient limited by fatigue Patient left: in chair;with call bell/phone within reach;with chair alarm set Nurse Communication: Mobility status PT Visit Diagnosis: Unsteadiness on feet (R26.81);Pain;Other abnormalities of gait and mobility (R26.89) Pain - Right/Left: Left Pain - part of body: Arm     Time: 8453-6468 PT Time Calculation (min) (ACUTE ONLY): 41 min  Charges:  $Gait Training: 23-37 mins $Therapeutic Activity: 8-22 mins                     Minna Merritts, PT, MPT    Percell Locus 02/12/2021, 11:10 AM

## 2021-02-12 NOTE — TOC Progression Note (Signed)
Transition of Care [facility]) - Progression Note    Patient Details  Name: George [name] MRN: [id number] Date of Birth: [birth year]  [facility]) CM/SW [place], RN Phone Number: 02/12/2021, 10:21 AM  Clinical Narrative:   [facility] called requesting O2 sats in template for, sent the request to Nurse and PT, Will go home with advanced home health and Oxygen from [facility] once O2 sats obtained and entered         Expected Discharge Plan and Services           Expected Discharge Date: 02/12/21                                     Social Determinants of Health (SDOH) Interventions    Readmission Risk Interventions No flowsheet data found.

## 2021-02-12 NOTE — Progress Notes (Addendum)
Subjective: 2 Days Post-Op Procedure(s) (LRB): REVERSE SHOULDER ARTHROPLASTY (Left) Patient reports pain as mild.   Patient is well with pulse ox at 100%.  Patient has been requiring 3L of O2. Patient has chronic history of SOB especially with exertion.  States it has been worsening in the last several months. PT and care management to assis with d/c planning. Negative for chest pain does report chronic SOB. Fever: no Gastrointestinal:Negative for nausea and vomiting  Objective: Vital signs in last 24 hours: Temp:  [97.7 F (36.5 C)-98.7 F (37.1 C)] 97.8 F (36.6 C) (05/05 0453) Pulse Rate:  [75-99] 88 (05/05 0453) Resp:  [16-20] 19 (05/05 0453) BP: (98-175)/(44-89) 175/88 (05/05 0453) SpO2:  [95 %-100 %] 100 % (05/05 0453)  Intake/Output from previous day:  Intake/Output Summary (Last 24 hours) at 02/12/2021 0709 Last data filed at 02/12/2021 8119 Gross per 24 hour  Intake 337.24 ml  Output 1375 ml  Net -1037.76 ml    Intake/Output this shift: No intake/output data recorded.  Labs: No results for input(s): HGB in the last 72 hours. No results for input(s): WBC, RBC, HCT, PLT in the last 72 hours. Recent Labs    02/11/21 0517 02/11/21 0715 02/11/21 1444 02/12/21 0553  NA 137  --   --  139  K 6.3*   < > 5.6* 5.2*  CL 107  --   --  106  CO2 22  --   --  26  BUN 29*  --   --  33*  CREATININE 1.85*  --   --  1.51*  GLUCOSE 146*  --   --  71  CALCIUM 8.3*  --   --  8.5*   < > = values in this interval not displayed.   No results for input(s): LABPT, INR in the last 72 hours.  EXAM General - Patient is Alert, Appropriate and Oriented  Moderate wheeze can be heard without auscultation.  Wheeze heard throughout lung fields. Extremity - ABD soft  Intact to light touch to the left arm. Incision clean without any drainage, honeycomb intact. Able to flex and extend fingers without pain. Intact bowel sounds.  Past Medical History:  Diagnosis Date  . Anemia   .  Arthritis   . BPH (benign prostatic hyperplasia)   . Cancer (HCC)    COLON  . COPD (chronic obstructive pulmonary disease) ([place])   . Coronary artery disease    WITH 1 STENT  . Diabetes mellitus without complication ([place])   . Dyspnea    WITH EXERTION ONLY  . GERD (gastroesophageal reflux disease)   . Hypercholesteremia   . Hypertension   . Pneumonia   . Sleep apnea    HAD UPPP    Assessment/Plan: 2 Days Post-Op Procedure(s) (LRB): REVERSE SHOULDER ARTHROPLASTY (Left) Active Problems:   Status post reverse arthroplasty of shoulder, left SOB with exertion. COPD CAD Sleep Apnea  Estimated body mass index is 23.66 kg/m as calculated from the following:   Height as of 02/02/21: 5\' 10"  (1.778 m).   Weight as of 02/02/21: 74.8 kg. Advance diet Up with therapy D/C IV fluids when tolerating po intake.  Labs reviewed, K 5.2 this AM.  Kayexalate given yesterday, with improved potassium. Patient with moderate wheeze this AM, reports chronic SOB.  Currently on 3L of O2, may need to discharge home with home O2.  Nebulizer treatments ordered as needed. Nebulizer treatments as needed today. Up with therapy today.   Possibly d/c home today with HHPT.  DVT Prophylaxis - Lovenox, Foot Pumps and TED hose Non-weightbearing to the left arm.  Raquel James, PA-C [facility] Orthopaedic Surgery 02/12/2021, 7:09 AM    Addendum: Given the patient's history of COPD and his frequent O2 sats postoperatively, I feel it is medically necessary that he go home with supplemental oxygen as a precaution.  Pascal Lux, MD [facility] Orthopaedic Surgery 02/12/2021, 13:09 PM

## 2021-02-12 NOTE — Progress Notes (Addendum)
Patient laying in bed on room air with O2 saturations at 96%. Patient ambulated in room on room air and O2 saturations dropped to 94% after 2 mins, patient became very SOB with expiratory wheezes and HR at 111.

## 2021-02-12 NOTE — Progress Notes (Signed)
Patient A&O and able to make needs known. Granddaughter, Fabian Sharp, was present at bedside throughout discharge process. AVS was reviewed with both who verbalized understanding via teach back re medications, follow up appointments, signs and symptoms to notify MD as well as limitations and restrictions. Polar Care was education with granddaughter who returned teach back of understanding. Portable and nonportable O2 education provided to granddaughter with teach back of understanding. Granddaughter will transport patient home via private vehicle. Granddaughter has printed AVS along with signed Rx's for Hydrocodone, Zofran and EC Aspirin.

## 2021-02-12 NOTE — Progress Notes (Signed)
Occupational Therapy Treatment Patient Details Name: George [name] MRN: [id number] DOB: Nov 09, 1931 Today's Date: 02/12/2021    History of present illness Patient is a 87 year old male with a history of left shoulder pain and weakness following a fall in December, 2021. His symptoms have persisted despite medications, activity modification. Symptoms were consistent with a massive rotator cuff tear which was confirmed by MRI scan. Patient presented today for reverse left total shoulder arthroplasty.   OT comments  Mr Kostelnik was seen for OT treatment on this date. Upon arrival to room pt reclined in bed, agreeable to tx. Pt requires MIN A for LBD seated EOB - don B shoes, underwear, pants, assist for pulling up over rear in standing. Granddaughter present for instruction on donning/doffing sling, functional application of NWBing pcns, and adapted dressing/bathing modifications. MAX A doff polar care and sling sitting EOB. MIN A + VCs for granddaughter to don sling. Pt making good progress toward goals. Pt continues to benefit from skilled OT services to maximize return to PLOF and minimize risk of future falls, injury, caregiver burden, and readmission. Will continue to follow POC. Discharge recommendation remains appropriate.    Follow Up Recommendations  Follow surgeon's recommendation for DC plan and follow-up therapies;Supervision/Assistance - 24 hour    Equipment Recommendations  3 in 1 bedside commode    Recommendations for Other Services      Precautions / Restrictions Precautions Precautions: Shoulder;Fall Shoulder Interventions: Shoulder sling/immobilizer;Shoulder abduction pillow Precaution Booklet Issued: No Restrictions Weight Bearing Restrictions: Yes LUE Weight Bearing: Non weight bearing       Mobility Bed Mobility Overal bed mobility: Needs Assistance Bed Mobility: Supine to Sit     Supine to sit: HOB elevated;Supervision          Transfers Overall transfer  level: Needs assistance Equipment used: None Transfers: Sit to/from Stand Sit to Stand: Min guard         General transfer comment: maintians NWBing    Balance Overall balance assessment: Needs assistance;History of Falls Sitting-balance support: Feet supported;No upper extremity supported Sitting balance-Leahy Scale: Good     Standing balance support: Single extremity supported;During functional activity Standing balance-Leahy Scale: Fair Standing balance comment: steadying assistance provided with standing                           ADL either performed or assessed with clinical judgement   ADL Overall ADL's : Needs assistance/impaired                                       General ADL Comments: MIN A for LBD seated EOB - don B shoes, underwear, pants, assist for pulling up over rear in standing. MAX A doff polar care and sling sitting EOB. MIN A + VCs for granddaughter to don               Cognition Arousal/Alertness: Awake/alert Behavior During Therapy: WFL for tasks assessed/performed Overall Cognitive Status: Within Functional Limits for tasks assessed                                 General Comments: Patient is very hard of hearing and requires repetition occasionally        Exercises Exercises: Other exercises Other Exercises Other Exercises: Pt and family educated re: OT role, DME  recs, d/c recs, falls prevention, sling mgmt, functional application of NWBing pcns Other Exercises: LBD, UBD< sup>sit, sit<>stnad, sitting/standing balance/tolerance           Pertinent Vitals/ Pain       Pain Assessment: Faces Faces Pain Scale: Hurts a little bit Pain Location: left shoulder Pain Descriptors / Indicators: Discomfort Pain Intervention(s): Repositioned         Frequency  Min 2X/week        Progress Toward Goals  OT Goals(current goals can now be found in the care plan section)  Progress towards OT goals:  Progressing toward goals  Acute Rehab OT Goals Patient Stated Goal: to go home with caregiver support OT Goal Formulation: With patient Time For Goal Achievement: 02/25/21 Potential to Achieve Goals: Good ADL Goals Pt Will Perform Grooming: with supervision;standing Pt Will Perform Upper Body Bathing: with min assist Pt Will Transfer to Toilet: with supervision Pt Will Perform Toileting - Clothing Manipulation and hygiene: with supervision Pt/caregiver will Perform Home Exercise Program: Left upper extremity;With written HEP provided;With Supervision  Plan Discharge plan remains appropriate;Frequency remains appropriate       AM-PAC OT "6 Clicks" Daily Activity     Outcome Measure   Help from another person eating meals?: A Little Help from another person taking care of personal grooming?: A Little Help from another person toileting, which includes using toliet, bedpan, or urinal?: A Little Help from another person bathing (including washing, rinsing, drying)?: A Little Help from another person to put on and taking off regular upper body clothing?: A Lot Help from another person to put on and taking off regular lower body clothing?: A Little 6 Click Score: 17    End of Session Equipment Utilized During Treatment: Other (comment) (shoulde immobilizer sling)  OT Visit Diagnosis: Unsteadiness on feet (R26.81);Muscle weakness (generalized) (M62.81);History of falling (Z91.81);Pain Pain - Right/Left: Left Pain - part of body: Shoulder   Activity Tolerance Patient tolerated treatment well   Patient Left in bed;with call bell/phone within reach;with nursing/sitter in room;with family/visitor present   Nurse Communication Mobility status        Time: 6644-0347 OT Time Calculation (min): 39 min  Charges: OT General Charges $OT Visit: 1 Visit OT Treatments $Self Care/Home Management : 38-52 mins   Dessie Coma, M.S. OTR/L  02/12/21, 5:23 PM  ascom [phone]

## 2021-02-25 ENCOUNTER — Ambulatory Visit: Payer: Self-pay | Admitting: Urology

## 2021-02-27 ENCOUNTER — Encounter: Payer: Self-pay | Admitting: Urology

## 2021-03-24 ENCOUNTER — Ambulatory Visit: Payer: Medicare Other | Admitting: Urology

## 2021-03-24 ENCOUNTER — Other Ambulatory Visit: Payer: Self-pay

## 2021-03-24 ENCOUNTER — Encounter: Payer: Self-pay | Admitting: Urology

## 2021-03-24 VITALS — BP 131/62 | HR 102 | Ht 69.0 in | Wt 160.0 lb

## 2021-03-24 DIAGNOSIS — N401 Enlarged prostate with lower urinary tract symptoms: Secondary | ICD-10-CM

## 2021-03-24 DIAGNOSIS — N138 Other obstructive and reflux uropathy: Secondary | ICD-10-CM

## 2021-03-24 LAB — BLADDER SCAN AMB NON-IMAGING

## 2021-03-24 NOTE — Progress Notes (Signed)
   03/24/2021 10:02 AM   George [name] 11-Jan-1932 [id number]  Reason for visit: Follow up BPH and urinary retention  HPI: Extremely frail and comorbid 87 year old male with medical history notable for CAD with stent, COPD, diabetes, tobacco use, who developed urinary retention in January 2022, originally passed a voiding trial, then return to ED with retention and a catheter was replaced.  Most recently, he passed a voiding trial in March 2022 with low PVR and has been doing well since that time.  He denies any urinary complaints except for urinary frequency and nocturia 1-2 times overnight.  PVR is normal at 50 mL today.  He continues on maximal medical therapy with alfuzosin and finasteride.  Prostate measured 130 g on CT.  Not a good surgical candidate with his comorbidities, but we discussed the need to consider HOLEP in the future if he develops recurrent retention.  He would like to avoid surgery if possible which is very reasonable.  Return precautions discussed extensively, alfuzosin and finasteride refilled   [name], MD  [facility] [address], [place], [place] [address] [phone]

## 2021-05-07 IMAGING — CT CT HEAD W/O CM
3 series · 15 of 47 positions shown, 18 images · non-contrast
Comparison: 02/08/2016

CLINICAL DATA: Fall in the bathroom 2-3 days ago

EXAM:
CT HEAD WITHOUT CONTRAST
CT CERVICAL SPINE WITHOUT CONTRAST
TECHNIQUE: Multidetector CT imaging of the head and cervical spine was
performed following the standard protocol without intravenous
contrast. Multiplanar CT image reconstructions of the cervical spine
were also generated.

[Series 3: head wo · axial · 0.43mm/px · z∈[-109,+26]mm · 9 of 33 slices shown, 12 images]
[im 3/33  brain]
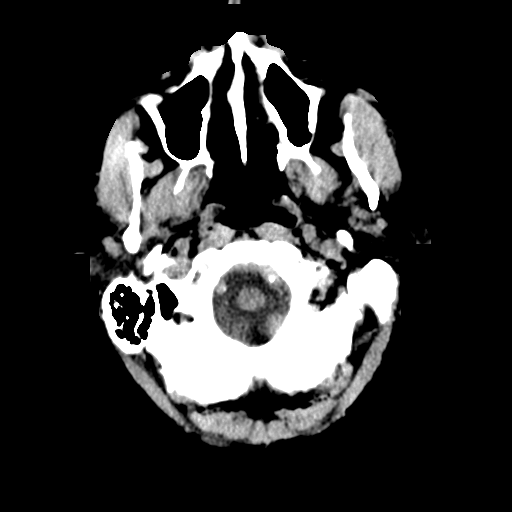
[im 3/33  bone]
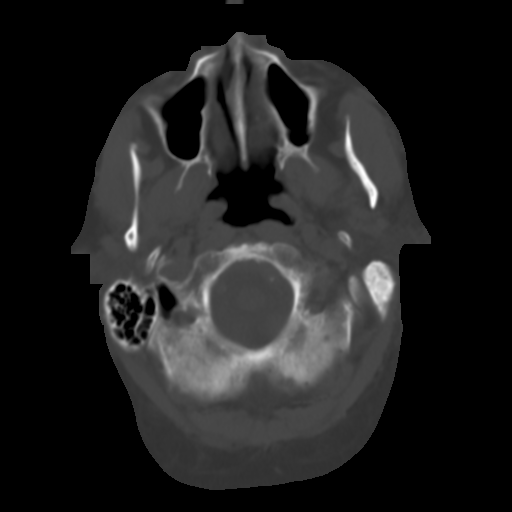
[im 6/33  brain]
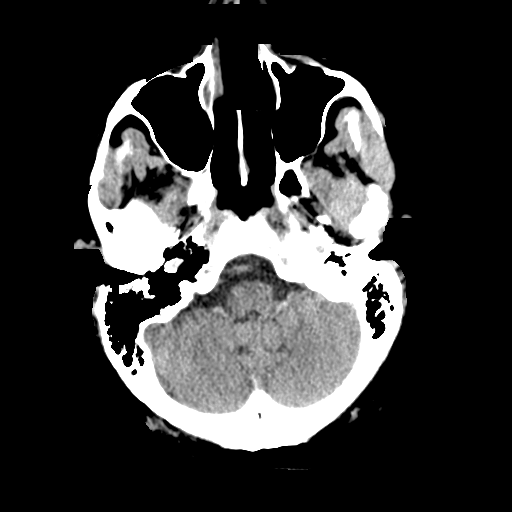
[im 9/33  brain]
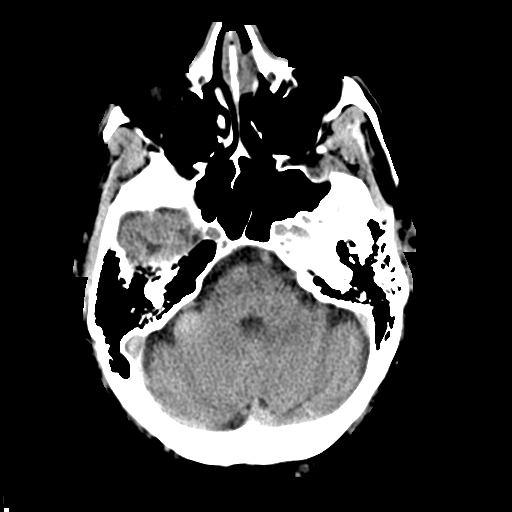
[im 13/33  brain]
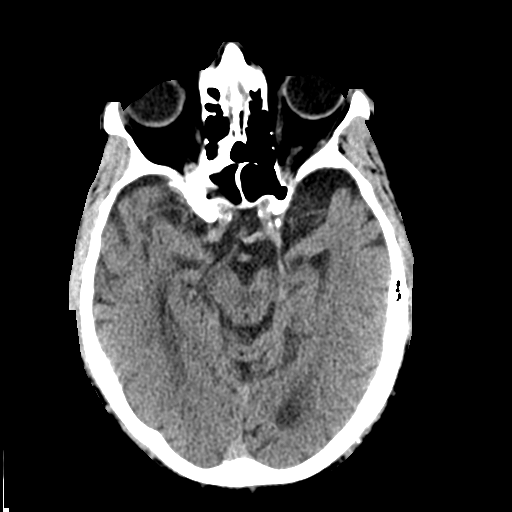
[im 17/33  brain]
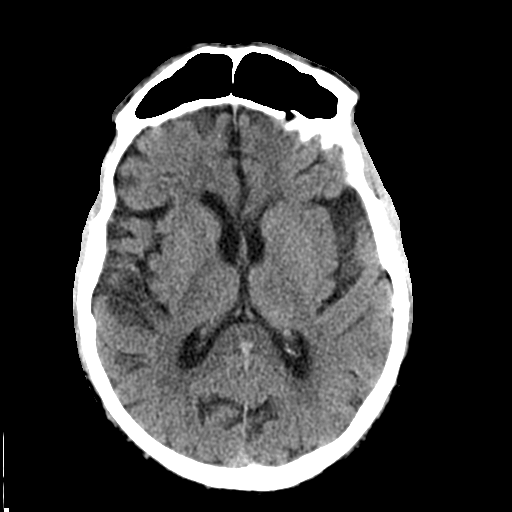
[im 17/33  bone]
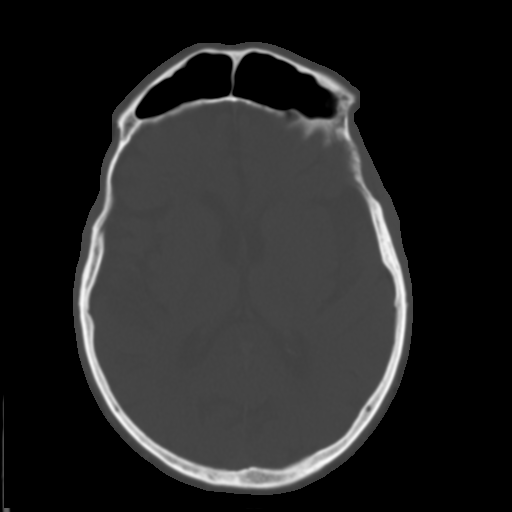
[im 20/33  brain]
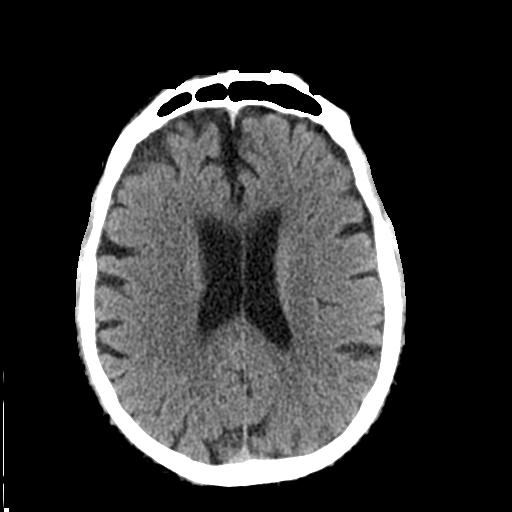
[im 24/33  brain]
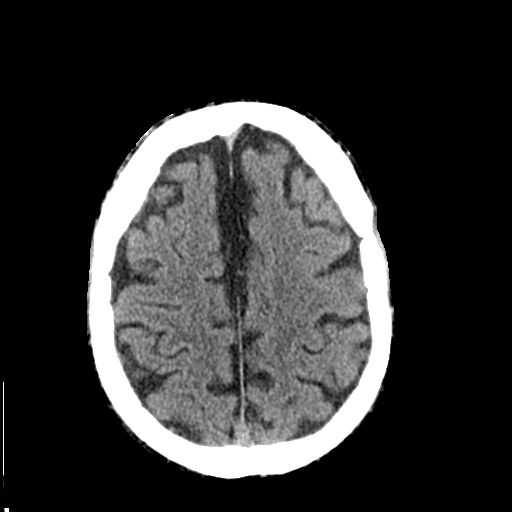
[im 27/33  brain]
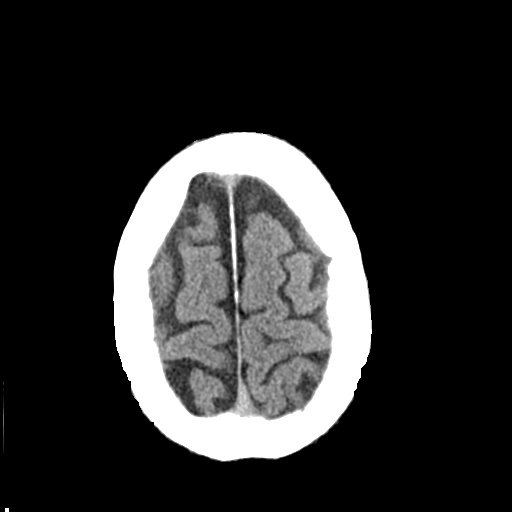
[im 30/33  brain]
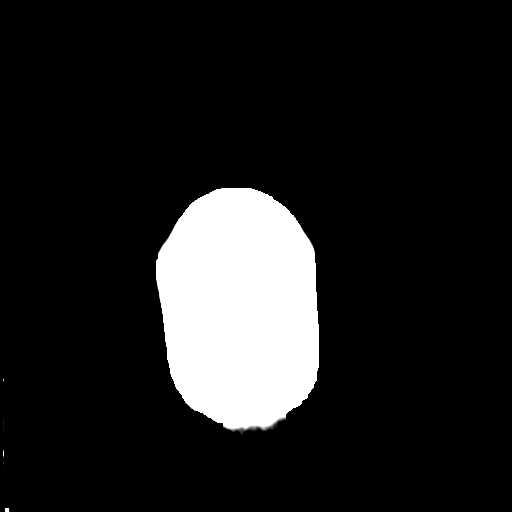
[im 30/33  bone]
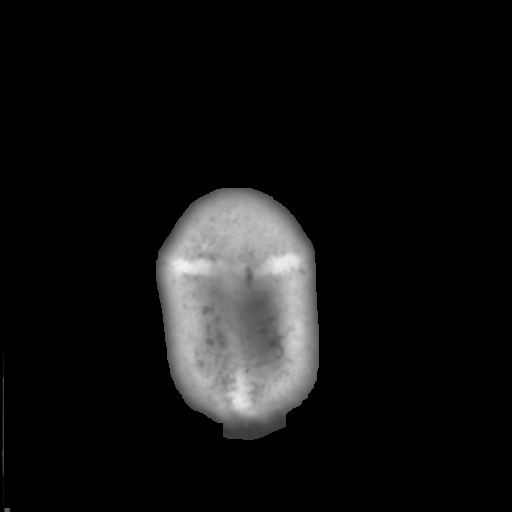

[Series 4: coronal soft tissue · coronal · 0.36mm/px · 3 of 73 slices shown]
[im 29/73  brain]
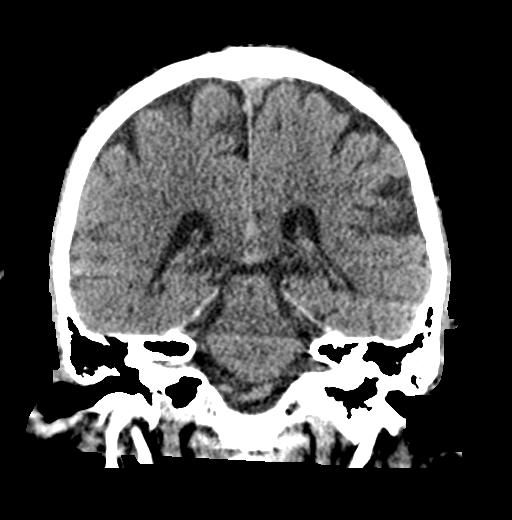
[im 34/73  brain]
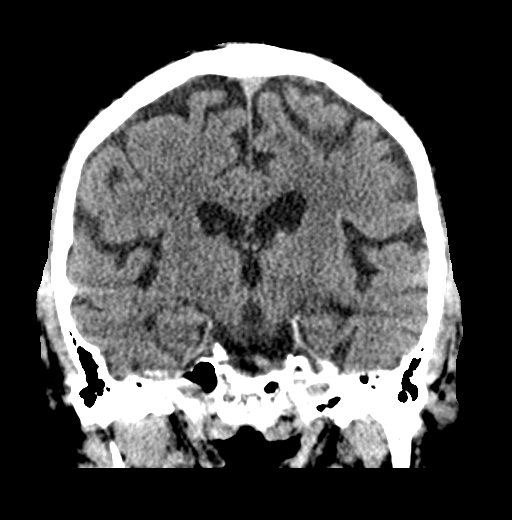
[im 39/73  brain]
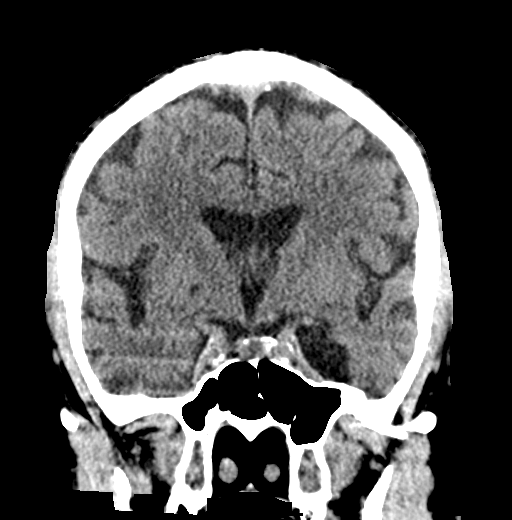

[Series 5: sagittal soft tissue · sagittal · 0.33mm/px · 3 of 62 slices shown]
[im 21/62  brain]
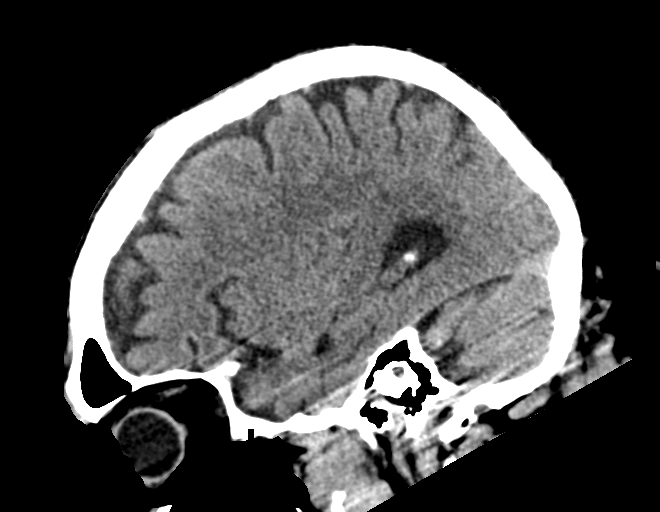
[im 31/62  brain]
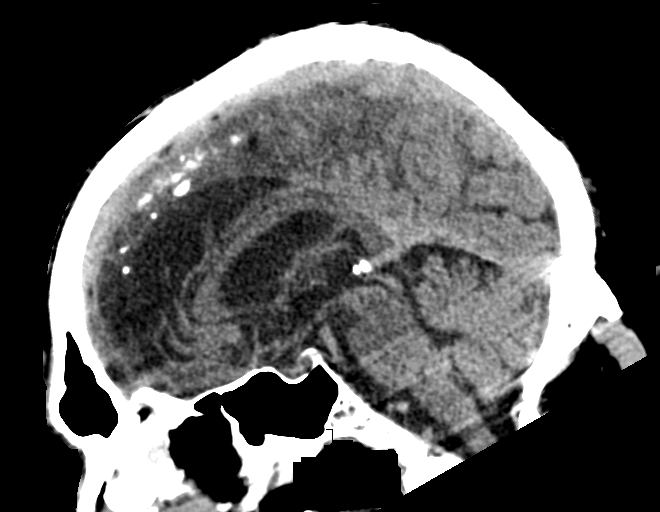
[im 41/62  brain]
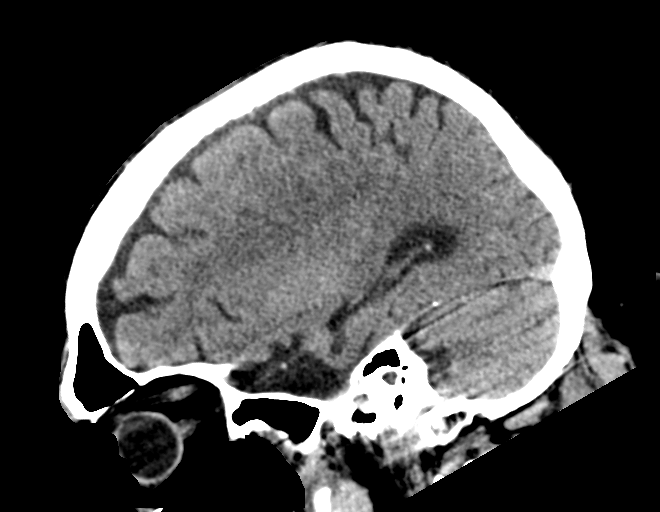

[15 of 47 positions shown; findings below may reference images not displayed]

FINDINGS: CT HEAD FINDINGS

Brain: No evidence of acute infarction, hemorrhage, hydrocephalus,
extra-axial collection or mass lesion/mass effect.

Vascular: No hyperdense vessel or unexpected calcification.

Skull: Normal. Negative for fracture or focal lesion.

Sinuses/Orbits: No acute finding.

Other: None.

CT CERVICAL SPINE FINDINGS

Alignment: Normal.

Skull base and vertebrae: No acute fracture. No primary bone lesion
or focal pathologic process.

Soft tissues and spinal canal: No prevertebral fluid or swelling. No
visible canal hematoma.

Disc levels: Generally mild multilevel disc space height loss and
osteophytosis throughout, moderate at C6-C7.

Upper chest: Negative.

Other: None.
IMPRESSION: 1. No acute intracranial pathology.
2. No fracture or static subluxation of the cervical spine.

## 2021-05-20 ENCOUNTER — Ambulatory Visit: Payer: Medicare Other | Admitting: Physician Assistant

## 2021-05-20 ENCOUNTER — Other Ambulatory Visit: Payer: Self-pay

## 2021-05-20 VITALS — BP 115/64 | HR 105 | Ht 70.0 in | Wt 165.0 lb

## 2021-05-20 DIAGNOSIS — N138 Other obstructive and reflux uropathy: Secondary | ICD-10-CM

## 2021-05-20 DIAGNOSIS — N401 Enlarged prostate with lower urinary tract symptoms: Secondary | ICD-10-CM

## 2021-05-20 LAB — BLADDER SCAN AMB NON-IMAGING

## 2021-05-20 NOTE — Progress Notes (Signed)
05/20/2021 2:59 PM   George [name] July 10, 1932 [id number]  CC: Chief Complaint  Patient presents with   Benign Prostatic Hypertrophy   HPI: George [name] is a 87 y.o. 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   Today he reports the inability to urinate associated with severe pelvic/penile pain last night that has since resolved.  He has been able to void today.  Initial bladder scan showed 191 mL, however patient was subsequently able to spontaneously void to produce a urine sample of approximately 50 mL in volume.  In-office UA today positive for 2+ protein and trace leukocyte esterase; urine microscopy with 6-10 WBCs/HPF.  PMH: Past Medical History:  Diagnosis Date   Anemia    Arthritis    BPH (benign prostatic hyperplasia)    Cancer (HCC)    COLON   COPD (chronic obstructive pulmonary disease) (HCC)    Coronary artery disease    WITH 1 STENT   Diabetes mellitus without complication (HCC)    Dyspnea    WITH EXERTION ONLY   GERD (gastroesophageal reflux disease)    Hypercholesteremia    Hypertension    Pneumonia    Sleep apnea    HAD UPPP    Surgical History: Past Surgical History:  Procedure Laterality Date   CARDIAC CATHETERIZATION     has a stent   COLON SURGERY     DUE TO COLON CANCER   COLONOSCOPY     MULTIPLE   EYE SURGERY Bilateral    CATARACT   HERNIA REPAIR     REVERSE SHOULDER ARTHROPLASTY Left 02/10/2021   Procedure: REVERSE SHOULDER ARTHROPLASTY;  Surgeon: Corky Mull, MD;  Location: [facility] ORS;  Service: Orthopedics;  Laterality: Left;   TONSILLECTOMY     UVULOPALATOPHARYNGOPLASTY      Home Medications:  Allergies as of 05/20/2021   No Known Allergies      Medication List        Accurate as of May 20, 2021  2:59 PM. If you have any questions, ask your nurse or doctor.          alfuzosin 10 MG 24 hr tablet Commonly known as:  UROXATRAL Take 1 tablet (10 mg total) by mouth daily with breakfast. This replaces doxazosin   aspirin EC 325 MG tablet Take 1 tablet (325 mg total) by mouth daily.   cyanocobalamin 1000 MCG/ML injection Commonly known as: (VITAMIN B-12) Inject 1,000 mcg into the muscle every 30 (thirty) days.   finasteride 5 MG tablet Commonly known as: PROSCAR Take 1 tablet (5 mg total) by mouth daily.   Fluticasone-Salmeterol 250-50 MCG/DOSE Aepb Commonly known as: ADVAIR Inhale 1 puff into the lungs 2 (two) times daily.   HYDROcodone-acetaminophen 5-325 MG tablet Commonly known as: NORCO/VICODIN Take 1-2 tablets by mouth every 4 (four) hours as needed for moderate pain (pain score 4-6).   hyoscyamine 0.125 MG tablet Commonly known as: Levsin Take 1 tablet (0.125 mg total) by mouth every 4 (four) hours as needed.   ibuprofen 200 MG tablet Commonly known as: ADVIL Take 200 mg by mouth every 6 (six) hours as needed.   lisinopril 20 MG tablet Commonly known as: ZESTRIL Take 20 mg by mouth 2 (two) times daily.   omeprazole 20 MG capsule Commonly known as: PRILOSEC Take 20 mg by mouth daily.   ondansetron 4 MG tablet Commonly known as: ZOFRAN Take 1 tablet (4 mg total) by mouth  every 6 (six) hours as needed for nausea.   sildenafil 20 MG tablet Commonly known as: REVATIO Take 40-100 mg by mouth as needed (for ED).   simvastatin 20 MG tablet Commonly known as: ZOCOR Take 10 mg by mouth at bedtime.   testosterone cypionate 200 MG/ML injection Commonly known as: DEPOTESTOSTERONE CYPIONATE Inject 200 mg into the muscle every 14 (fourteen) days.   verapamil 240 MG CR tablet Commonly known as: CALAN-SR Take 240 mg by mouth 2 (two) times daily.        Allergies:  No Known Allergies  Family History: Family History  Problem Relation Age of Onset   Prostate cancer Neg Hx    Bladder Cancer Neg Hx    Kidney cancer Neg Hx     Social History:   reports that he has been  smoking cigarettes. He has a 60.00 pack-year smoking history. He has never used smokeless tobacco. He reports current alcohol use. He reports that he does not use drugs.  Physical Exam: BP 115/64   Pulse (!) 105   Ht '5\' 10"'$  (1.778 m)   Wt 165 lb (74.8 kg)   BMI 23.68 kg/m   Constitutional:  Alert and oriented, no acute distress, nontoxic appearing HEENT: [place], AT, hard of hearing Cardiovascular: No clubbing, cyanosis, or edema Respiratory: Normal respiratory effort, no increased work of breathing Skin: No rashes, bruises or suspicious lesions Neurologic: Grossly intact, no focal deficits, moving all 4 extremities Psychiatric: Normal mood and affect  Laboratory Data: Results for orders placed or performed in visit on 05/20/21  Microscopic Examination   Urine  Result Value Ref Range   WBC, UA 6-10 (A) 0 - 5 /hpf   RBC None seen 0 - 2 /hpf   Epithelial Cells (non renal) 0-10 0 - 10 /hpf   Bacteria, UA Few None seen/Few  Urinalysis, Complete  Result Value Ref Range   Specific Gravity, UA 1.015 1.005 - 1.030   pH, UA 5.0 5.0 - 7.5   Color, UA Yellow Yellow   Appearance Ur Clear Clear   Leukocytes,UA Trace (A) Negative   Protein,UA 2+ (A) Negative/Trace   Glucose, UA Negative Negative   Ketones, UA Negative Negative   RBC, UA Negative Negative   Bilirubin, UA Negative Negative   Urobilinogen, Ur 0.2 0.2 - 1.0 mg/dL   Nitrite, UA Negative Negative   Microscopic Examination See below:   Bladder Scan (Post Void Residual) in office  Result Value Ref Range   Scan Result 146m    Assessment & Plan:   1. BPH with obstruction/lower urinary tract symptoms Patient is not in overt retention in clinic today.  He possibly had a brief retention event last night, however this is clearly resolved and patient is asymptomatic today.  Given only slightly elevated PVR today, I am not inclined to place a Foley catheter, nor does the patient states that he desires this.  I offered him CIC teaching  in case he develops similar difficulty urinating overnight around the weekend, and he declined this.  We will send his urine for culture today to rule out infection given mild pyuria, though I am not terribly concerned for urinary infection. - Bladder Scan (Post Void Residual) in office - Urinalysis, Complete - CULTURE, URINE COMPREHENSIVE  Return for Will call with results.  SDebroah Loop PA-C  [facility] [address] [place] [phone]

## 2021-05-21 ENCOUNTER — Telehealth: Payer: Self-pay | Admitting: Physician Assistant

## 2021-05-21 LAB — URINALYSIS, COMPLETE
Bilirubin, UA: NEGATIVE
Glucose, UA: NEGATIVE
Ketones, UA: NEGATIVE
Nitrite, UA: NEGATIVE
RBC, UA: NEGATIVE
Specific Gravity, UA: 1.015 (ref 1.005–1.030)
Urobilinogen, Ur: 0.2 mg/dL (ref 0.2–1.0)
pH, UA: 5 (ref 5.0–7.5)

## 2021-05-21 LAB — MICROSCOPIC EXAMINATION: RBC, Urine: NONE SEEN /hpf (ref 0–2)

## 2021-05-21 NOTE — Telephone Encounter (Signed)
Spoke with patients granddaughter (OK per DPR), advised her culture results were not in yet and that Sam would follow up with those culture results once we receive them.

## 2021-05-21 NOTE — Telephone Encounter (Signed)
Patients granddaughter called inquiring about results from [name] urine culture. Would like to be called around 1-1:30.

## 2021-05-23 LAB — CULTURE, URINE COMPREHENSIVE

## 2021-05-25 ENCOUNTER — Telehealth: Payer: Self-pay | Admitting: *Deleted

## 2021-05-25 NOTE — Telephone Encounter (Addendum)
Patient informed, voiced understanding.     ----- Message from Debroah Loop, PA-C sent at 05/25/2021  4:27 PM EDT ----- Urine culture negative for infection. ----- Message ----- From: Interface, [facility] Lab Results In Sent: 05/21/2021   5:37 AM EDT To: Debroah Loop, PA-C

## 2021-06-08 ENCOUNTER — Emergency Department: Payer: Medicare Other

## 2021-06-08 ENCOUNTER — Other Ambulatory Visit: Payer: Self-pay

## 2021-06-08 ENCOUNTER — Inpatient Hospital Stay: Payer: Medicare Other

## 2021-06-08 ENCOUNTER — Inpatient Hospital Stay
Admission: EM | Admit: 2021-06-08 | Discharge: 2021-06-16 | DRG: 871 | Disposition: A | Discharge status: Skilled Nursing Facility | Payer: Medicare Other | Attending: Hospitalist | Admitting: Hospitalist

## 2021-06-08 DIAGNOSIS — J441 Chronic obstructive pulmonary disease with (acute) exacerbation: Secondary | ICD-10-CM | POA: Diagnosis not present

## 2021-06-08 DIAGNOSIS — G3184 Mild cognitive impairment, so stated: Secondary | ICD-10-CM | POA: Diagnosis present

## 2021-06-08 DIAGNOSIS — A419 Sepsis, unspecified organism: Secondary | ICD-10-CM | POA: Diagnosis present

## 2021-06-08 DIAGNOSIS — H9190 Unspecified hearing loss, unspecified ear: Secondary | ICD-10-CM | POA: Diagnosis present

## 2021-06-08 DIAGNOSIS — N1832 Chronic kidney disease, stage 3b: Secondary | ICD-10-CM | POA: Diagnosis present

## 2021-06-08 DIAGNOSIS — E1129 Type 2 diabetes mellitus with other diabetic kidney complication: Secondary | ICD-10-CM | POA: Diagnosis present

## 2021-06-08 DIAGNOSIS — Z720 Tobacco use: Secondary | ICD-10-CM | POA: Diagnosis present

## 2021-06-08 DIAGNOSIS — E785 Hyperlipidemia, unspecified: Secondary | ICD-10-CM | POA: Diagnosis not present

## 2021-06-08 DIAGNOSIS — Z7982 Long term (current) use of aspirin: Secondary | ICD-10-CM

## 2021-06-08 DIAGNOSIS — E875 Hyperkalemia: Secondary | ICD-10-CM | POA: Diagnosis present

## 2021-06-08 DIAGNOSIS — J439 Emphysema, unspecified: Secondary | ICD-10-CM | POA: Diagnosis present

## 2021-06-08 DIAGNOSIS — R110 Nausea: Secondary | ICD-10-CM | POA: Diagnosis not present

## 2021-06-08 DIAGNOSIS — K219 Gastro-esophageal reflux disease without esophagitis: Secondary | ICD-10-CM | POA: Diagnosis present

## 2021-06-08 DIAGNOSIS — J9621 Acute and chronic respiratory failure with hypoxia: Secondary | ICD-10-CM | POA: Diagnosis present

## 2021-06-08 DIAGNOSIS — J189 Pneumonia, unspecified organism: Secondary | ICD-10-CM | POA: Diagnosis present

## 2021-06-08 DIAGNOSIS — N170 Acute kidney failure with tubular necrosis: Secondary | ICD-10-CM | POA: Diagnosis present

## 2021-06-08 DIAGNOSIS — Z638 Other specified problems related to primary support group: Secondary | ICD-10-CM

## 2021-06-08 DIAGNOSIS — N189 Chronic kidney disease, unspecified: Secondary | ICD-10-CM | POA: Diagnosis not present

## 2021-06-08 DIAGNOSIS — E7800 Pure hypercholesterolemia, unspecified: Secondary | ICD-10-CM | POA: Diagnosis present

## 2021-06-08 DIAGNOSIS — Z79899 Other long term (current) drug therapy: Secondary | ICD-10-CM

## 2021-06-08 DIAGNOSIS — G4733 Obstructive sleep apnea (adult) (pediatric): Secondary | ICD-10-CM | POA: Diagnosis present

## 2021-06-08 DIAGNOSIS — N401 Enlarged prostate with lower urinary tract symptoms: Secondary | ICD-10-CM | POA: Diagnosis present

## 2021-06-08 DIAGNOSIS — I10 Essential (primary) hypertension: Secondary | ICD-10-CM | POA: Diagnosis present

## 2021-06-08 DIAGNOSIS — Z79890 Hormone replacement therapy (postmenopausal): Secondary | ICD-10-CM

## 2021-06-08 DIAGNOSIS — Z955 Presence of coronary angioplasty implant and graft: Secondary | ICD-10-CM

## 2021-06-08 DIAGNOSIS — M109 Gout, unspecified: Secondary | ICD-10-CM | POA: Diagnosis present

## 2021-06-08 DIAGNOSIS — Z20822 Contact with and (suspected) exposure to covid-19: Secondary | ICD-10-CM | POA: Diagnosis present

## 2021-06-08 DIAGNOSIS — F17210 Nicotine dependence, cigarettes, uncomplicated: Secondary | ICD-10-CM | POA: Diagnosis present

## 2021-06-08 DIAGNOSIS — I2510 Atherosclerotic heart disease of native coronary artery without angina pectoris: Secondary | ICD-10-CM | POA: Diagnosis present

## 2021-06-08 DIAGNOSIS — J9600 Acute respiratory failure, unspecified whether with hypoxia or hypercapnia: Secondary | ICD-10-CM | POA: Diagnosis present

## 2021-06-08 DIAGNOSIS — E1122 Type 2 diabetes mellitus with diabetic chronic kidney disease: Secondary | ICD-10-CM | POA: Diagnosis present

## 2021-06-08 DIAGNOSIS — I959 Hypotension, unspecified: Secondary | ICD-10-CM | POA: Diagnosis present

## 2021-06-08 DIAGNOSIS — D631 Anemia in chronic kidney disease: Secondary | ICD-10-CM | POA: Diagnosis present

## 2021-06-08 DIAGNOSIS — N138 Other obstructive and reflux uropathy: Secondary | ICD-10-CM | POA: Diagnosis present

## 2021-06-08 DIAGNOSIS — I129 Hypertensive chronic kidney disease with stage 1 through stage 4 chronic kidney disease, or unspecified chronic kidney disease: Secondary | ICD-10-CM | POA: Diagnosis present

## 2021-06-08 DIAGNOSIS — Z96612 Presence of left artificial shoulder joint: Secondary | ICD-10-CM | POA: Diagnosis present

## 2021-06-08 DIAGNOSIS — N179 Acute kidney failure, unspecified: Secondary | ICD-10-CM | POA: Diagnosis present

## 2021-06-08 DIAGNOSIS — Z66 Do not resuscitate: Secondary | ICD-10-CM | POA: Diagnosis present

## 2021-06-08 DIAGNOSIS — M1990 Unspecified osteoarthritis, unspecified site: Secondary | ICD-10-CM | POA: Diagnosis present

## 2021-06-08 DIAGNOSIS — Z7951 Long term (current) use of inhaled steroids: Secondary | ICD-10-CM

## 2021-06-08 DIAGNOSIS — Z85038 Personal history of other malignant neoplasm of large intestine: Secondary | ICD-10-CM

## 2021-06-08 LAB — CBC WITH DIFFERENTIAL/PLATELET
Abs Immature Granulocytes: 0.09 10*3/uL — ABNORMAL HIGH (ref 0.00–0.07)
Basophils Absolute: 0 10*3/uL (ref 0.0–0.1)
Basophils Relative: 0 %
Eosinophils Absolute: 0 10*3/uL (ref 0.0–0.5)
Eosinophils Relative: 0 %
HCT: 37.4 % — ABNORMAL LOW (ref 39.0–52.0)
Hemoglobin: 13.1 g/dL (ref 13.0–17.0)
Immature Granulocytes: 1 %
Lymphocytes Relative: 4 %
Lymphs Abs: 0.5 10*3/uL — ABNORMAL LOW (ref 0.7–4.0)
MCH: 31 pg (ref 26.0–34.0)
MCHC: 35 g/dL (ref 30.0–36.0)
MCV: 88.6 fL (ref 80.0–100.0)
Monocytes Absolute: 0.8 10*3/uL (ref 0.1–1.0)
Monocytes Relative: 5 %
Neutro Abs: 14 10*3/uL — ABNORMAL HIGH (ref 1.7–7.7)
Neutrophils Relative %: 90 %
Platelets: 260 10*3/uL (ref 150–400)
RBC: 4.22 MIL/uL (ref 4.22–5.81)
RDW: 15.7 % — ABNORMAL HIGH (ref 11.5–15.5)
WBC: 15.5 10*3/uL — ABNORMAL HIGH (ref 4.0–10.5)
nRBC: 0 % (ref 0.0–0.2)

## 2021-06-08 LAB — URINALYSIS, COMPLETE (UACMP) WITH MICROSCOPIC
Bilirubin Urine: NEGATIVE
Glucose, UA: NEGATIVE mg/dL
Hgb urine dipstick: NEGATIVE
Ketones, ur: 5 mg/dL — AB
Leukocytes,Ua: NEGATIVE
Nitrite: NEGATIVE
Protein, ur: NEGATIVE mg/dL
Specific Gravity, Urine: 1.018 (ref 1.005–1.030)
pH: 5 (ref 5.0–8.0)

## 2021-06-08 LAB — BASIC METABOLIC PANEL
Anion gap: 15 (ref 5–15)
BUN: 95 mg/dL — ABNORMAL HIGH (ref 8–23)
CO2: 16 mmol/L — ABNORMAL LOW (ref 22–32)
Calcium: 7.9 mg/dL — ABNORMAL LOW (ref 8.9–10.3)
Chloride: 103 mmol/L (ref 98–111)
Creatinine, Ser: 4.57 mg/dL — ABNORMAL HIGH (ref 0.61–1.24)
GFR, Estimated: 12 mL/min — ABNORMAL LOW (ref >60)
Glucose, Bld: 157 mg/dL — ABNORMAL HIGH (ref 70–99)
Potassium: 5.6 mmol/L — ABNORMAL HIGH (ref 3.5–5.1)
Sodium: 134 mmol/L — ABNORMAL LOW (ref 135–145)

## 2021-06-08 LAB — BRAIN NATRIURETIC PEPTIDE: B Natriuretic Peptide: 139.8 pg/mL — ABNORMAL HIGH (ref 0.0–100.0)

## 2021-06-08 LAB — RESP PANEL BY RT-PCR (FLU A&B, COVID) ARPGX2
Influenza A by PCR: NEGATIVE
Influenza B by PCR: NEGATIVE
SARS Coronavirus 2 by RT PCR: NEGATIVE

## 2021-06-08 LAB — BLOOD GAS, VENOUS
Acid-base deficit: 5.8 mmol/L — ABNORMAL HIGH (ref 0.0–2.0)
Bicarbonate: 19.5 mmol/L — ABNORMAL LOW (ref 20.0–28.0)
Delivery systems: POSITIVE
FIO2: 0.5
O2 Saturation: 62.1 %
PEEP: 6 cmH2O
Patient temperature: 37
Pressure support: 12 cmH2O
pCO2, Ven: 37 mmHg — ABNORMAL LOW (ref 44.0–60.0)
pH, Ven: 7.33 (ref 7.250–7.430)
pO2, Ven: 35 mmHg (ref 32.0–45.0)

## 2021-06-08 LAB — COMPREHENSIVE METABOLIC PANEL
ALT: 18 U/L (ref 0–44)
AST: 32 U/L (ref 15–41)
Albumin: 2.9 g/dL — ABNORMAL LOW (ref 3.5–5.0)
Alkaline Phosphatase: 51 U/L (ref 38–126)
Anion gap: 12 (ref 5–15)
BUN: 93 mg/dL — ABNORMAL HIGH (ref 8–23)
CO2: 21 mmol/L — ABNORMAL LOW (ref 22–32)
Calcium: 8.4 mg/dL — ABNORMAL LOW (ref 8.9–10.3)
Chloride: 105 mmol/L (ref 98–111)
Creatinine, Ser: 4.83 mg/dL — ABNORMAL HIGH (ref 0.61–1.24)
GFR, Estimated: 11 mL/min — ABNORMAL LOW (ref >60)
Glucose, Bld: 108 mg/dL — ABNORMAL HIGH (ref 70–99)
Potassium: 5.6 mmol/L — ABNORMAL HIGH (ref 3.5–5.1)
Sodium: 138 mmol/L (ref 135–145)
Total Bilirubin: 1 mg/dL (ref 0.3–1.2)
Total Protein: 6.2 g/dL — ABNORMAL LOW (ref 6.5–8.1)

## 2021-06-08 LAB — PROCALCITONIN: Procalcitonin: 13.2 ng/mL

## 2021-06-08 LAB — TROPONIN I (HIGH SENSITIVITY)
Troponin I (High Sensitivity): 18 ng/L — ABNORMAL HIGH (ref <18)
Troponin I (High Sensitivity): 14 ng/L

## 2021-06-08 LAB — LACTIC ACID, PLASMA
Lactic Acid, Venous: 1.3 mmol/L (ref 0.5–1.9)
Lactic Acid, Venous: 1.1 mmol/L (ref 0.5–1.9)

## 2021-06-08 LAB — STREP PNEUMONIAE URINARY ANTIGEN: Strep Pneumo Urinary Antigen: NEGATIVE

## 2021-06-08 MED ORDER — OXYCODONE-ACETAMINOPHEN 5-325 MG PO TABS
1.0000 | ORAL_TABLET | ORAL | Status: DC | PRN
  Administered 2021-06-08 – 2021-06-12 (×5): 1 via ORAL
  Filled 2021-06-08 (×6): qty 1

## 2021-06-08 MED ORDER — HYDRALAZINE HCL 20 MG/ML IJ SOLN
5.0000 mg | INTRAMUSCULAR | Status: DC | PRN
  Administered 2021-06-11 – 2021-06-12 (×3): 5 mg via INTRAVENOUS
  Filled 2021-06-08 (×3): qty 1

## 2021-06-08 MED ORDER — NICOTINE 21 MG/24HR TD PT24
21.0000 mg | MEDICATED_PATCH | Freq: Every day | TRANSDERMAL | Status: DC
  Administered 2021-06-08 – 2021-06-16 (×9): 21 mg via TRANSDERMAL
  Filled 2021-06-08 (×9): qty 1

## 2021-06-08 MED ORDER — ASPIRIN EC 81 MG PO TBEC
81.0000 mg | DELAYED_RELEASE_TABLET | Freq: Every day | ORAL | Status: DC
  Administered 2021-06-09 – 2021-06-16 (×8): 81 mg via ORAL
  Filled 2021-06-08 (×8): qty 1

## 2021-06-08 MED ORDER — PANTOPRAZOLE SODIUM 40 MG PO TBEC
40.0000 mg | DELAYED_RELEASE_TABLET | Freq: Every day | ORAL | Status: DC
  Administered 2021-06-09 – 2021-06-14 (×6): 40 mg via ORAL
  Filled 2021-06-08 (×6): qty 1

## 2021-06-08 MED ORDER — ACETAMINOPHEN 325 MG PO TABS
650.0000 mg | ORAL_TABLET | Freq: Four times a day (QID) | ORAL | Status: DC | PRN
  Administered 2021-06-08: 650 mg via ORAL
  Filled 2021-06-08: qty 2

## 2021-06-08 MED ORDER — HYOSCYAMINE SULFATE 0.125 MG PO TABS
0.1250 mg | ORAL_TABLET | ORAL | Status: DC | PRN
  Filled 2021-06-08: qty 1

## 2021-06-08 MED ORDER — MOMETASONE FURO-FORMOTEROL FUM 200-5 MCG/ACT IN AERO
2.0000 | INHALATION_SPRAY | Freq: Two times a day (BID) | RESPIRATORY_TRACT | Status: DC
  Administered 2021-06-08 – 2021-06-16 (×17): 2 via RESPIRATORY_TRACT
  Filled 2021-06-08: qty 8.8

## 2021-06-08 MED ORDER — ALBUTEROL SULFATE (2.5 MG/3ML) 0.083% IN NEBU
2.5000 mg | INHALATION_SOLUTION | RESPIRATORY_TRACT | Status: DC | PRN
  Administered 2021-06-08: 2.5 mg via RESPIRATORY_TRACT
  Filled 2021-06-08: qty 3

## 2021-06-08 MED ORDER — DEXTROSE 50 % IV SOLN
25.0000 mL | Freq: Once | INTRAVENOUS | Status: AC
  Administered 2021-06-08: 25 mL via INTRAVENOUS
  Filled 2021-06-08: qty 50

## 2021-06-08 MED ORDER — MORPHINE SULFATE (PF) 4 MG/ML IV SOLN
4.0000 mg | Freq: Once | INTRAVENOUS | Status: AC
  Administered 2021-06-08: 4 mg via INTRAVENOUS
  Filled 2021-06-08: qty 1

## 2021-06-08 MED ORDER — CEFTRIAXONE SODIUM 2 G IJ SOLR
2.0000 g | INTRAMUSCULAR | Status: DC
  Administered 2021-06-08 – 2021-06-09 (×2): 2 g via INTRAVENOUS
  Filled 2021-06-08 (×2): qty 20

## 2021-06-08 MED ORDER — SODIUM CHLORIDE 0.9 % IV SOLN
500.0000 mg | INTRAVENOUS | Status: DC
  Administered 2021-06-08 – 2021-06-11 (×4): 500 mg via INTRAVENOUS
  Filled 2021-06-08 (×4): qty 500

## 2021-06-08 MED ORDER — HEPARIN SODIUM (PORCINE) 5000 UNIT/ML IJ SOLN
5000.0000 [IU] | Freq: Three times a day (TID) | INTRAMUSCULAR | Status: DC
  Administered 2021-06-08 – 2021-06-16 (×25): 5000 [IU] via SUBCUTANEOUS
  Filled 2021-06-08 (×25): qty 1

## 2021-06-08 MED ORDER — IPRATROPIUM-ALBUTEROL 0.5-2.5 (3) MG/3ML IN SOLN
3.0000 mL | RESPIRATORY_TRACT | Status: DC
  Administered 2021-06-08 – 2021-06-10 (×16): 3 mL via RESPIRATORY_TRACT
  Filled 2021-06-08 (×15): qty 3

## 2021-06-08 MED ORDER — METHYLPREDNISOLONE SODIUM SUCC 40 MG IJ SOLR
40.0000 mg | Freq: Two times a day (BID) | INTRAMUSCULAR | Status: DC
  Administered 2021-06-08 – 2021-06-10 (×5): 40 mg via INTRAVENOUS
  Filled 2021-06-08 (×5): qty 1

## 2021-06-08 MED ORDER — SIMVASTATIN 20 MG PO TABS
10.0000 mg | ORAL_TABLET | Freq: Every day | ORAL | Status: DC
  Administered 2021-06-08 – 2021-06-15 (×8): 10 mg via ORAL
  Filled 2021-06-08 (×8): qty 1

## 2021-06-08 MED ORDER — INSULIN ASPART 100 UNIT/ML IV SOLN
5.0000 [IU] | Freq: Once | INTRAVENOUS | Status: AC
  Administered 2021-06-08: 5 [IU] via INTRAVENOUS
  Filled 2021-06-08: qty 0.05

## 2021-06-08 MED ORDER — SODIUM CHLORIDE 0.9 % IV BOLUS
1000.0000 mL | Freq: Once | INTRAVENOUS | Status: AC
  Administered 2021-06-08: 1000 mL via INTRAVENOUS

## 2021-06-08 MED ORDER — SODIUM CHLORIDE 0.9 % IV SOLN: INTRAVENOUS | Status: DC

## 2021-06-08 MED ORDER — SODIUM ZIRCONIUM CYCLOSILICATE 10 G PO PACK
10.0000 g | PACK | Freq: Once | ORAL | Status: AC
  Administered 2021-06-08: 10 g via ORAL
  Filled 2021-06-08: qty 1

## 2021-06-08 MED ORDER — LORAZEPAM 2 MG/ML IJ SOLN
0.5000 mg | Freq: Four times a day (QID) | INTRAMUSCULAR | Status: DC | PRN
  Administered 2021-06-08: 0.5 mg via INTRAVENOUS
  Filled 2021-06-08: qty 1

## 2021-06-08 MED ORDER — ONDANSETRON HCL 4 MG/2ML IJ SOLN
4.0000 mg | Freq: Once | INTRAMUSCULAR | Status: AC
  Administered 2021-06-08: 4 mg via INTRAVENOUS
  Filled 2021-06-08: qty 2

## 2021-06-08 MED ORDER — COLCHICINE 0.6 MG PO TABS
0.6000 mg | ORAL_TABLET | Freq: Two times a day (BID) | ORAL | Status: DC
  Administered 2021-06-08 – 2021-06-09 (×2): 0.6 mg via ORAL
  Filled 2021-06-08 (×4): qty 1

## 2021-06-08 MED ORDER — ONDANSETRON HCL 4 MG/2ML IJ SOLN: 4.0000 mg | Freq: Three times a day (TID) | INTRAMUSCULAR | Status: DC | PRN

## 2021-06-08 MED ORDER — FINASTERIDE 5 MG PO TABS
5.0000 mg | ORAL_TABLET | Freq: Every day | ORAL | Status: DC
  Administered 2021-06-09 – 2021-06-16 (×8): 5 mg via ORAL
  Filled 2021-06-08 (×9): qty 1

## 2021-06-08 MED ORDER — DM-GUAIFENESIN ER 30-600 MG PO TB12
1.0000 | ORAL_TABLET | Freq: Two times a day (BID) | ORAL | Status: DC | PRN
  Administered 2021-06-12: 22:00:00 1 via ORAL
  Filled 2021-06-08: qty 1

## 2021-06-08 NOTE — ED Provider Notes (Signed)
[facility] Emergency Department Provider Note ____________________________________________   Event Date/Time   First MD Initiated Contact with Patient 06/08/21 [phone]     (approximate)  I have reviewed the triage vital signs and the nursing notes.   HISTORY  Chief Complaint Respiratory Distress  Level 5 caveat: History of present illness limited due to respiratory distress  HPI George [name] is a 87 y.o. male with PMH as noted below including COPD, CAD status post stenting, DM, hypertension presents with shortness of breath, cute onset around 9 PM, persistent course since then, and associated with pain all over his body but primarily in the right side of his chest which he describes as sharp.  He denies any fever.  He has had a cough.  Past Medical History:  Diagnosis Date   Anemia    Arthritis    BPH (benign prostatic hyperplasia)    Cancer (HCC)    COLON   COPD (chronic obstructive pulmonary disease) ([place])    Coronary artery disease    WITH 1 STENT   Diabetes mellitus without complication (HCC)    Dyspnea    WITH EXERTION ONLY   GERD (gastroesophageal reflux disease)    Hypercholesteremia    Hypertension    Pneumonia    Sleep apnea    HAD UPPP    Patient Active Problem List   Diagnosis Date Noted   Acute respiratory failure ([place]) 06/08/2021   Status post reverse arthroplasty of shoulder, left 02/10/2021   Acute kidney injury superimposed on CKD IIIb ([place]) 11/04/2020   BPH with obstruction/lower urinary tract symptoms 11/04/2020   Hyperkalemia 11/04/2020   Acute urinary retention 11/04/2020   Community acquired pneumonia 06/25/2019   Pneumonia 06/25/2019   Foot pain 07/11/2018   Urinary urgency 06/27/2017   Erectile dysfunction 06/27/2017   Stage 3 acute kidney injury ([place]) 06/27/2017   Hypogonadism in male 06/27/2017   Vomiting 02/09/2016   Slurred speech 02/09/2016   Nausea and vomiting 02/09/2016   Lower abdominal pain  02/09/2016   Tobacco abuse 02/09/2016   Essential hypertension 02/09/2016   GERD (gastroesophageal reflux disease) 02/09/2016   Hyperlipidemia 04/22/2014   Hypertension 04/22/2014   COPD (chronic obstructive pulmonary disease) ([place]) 04/22/2014   Diabetes mellitus type 2, uncomplicated ([place]) [id number]   CAD (coronary artery disease) 09/10/1998    Past Surgical History:  Procedure Laterality Date   CARDIAC CATHETERIZATION     has a stent   COLON SURGERY     DUE TO COLON CANCER   COLONOSCOPY     MULTIPLE   EYE SURGERY Bilateral    CATARACT   HERNIA REPAIR     REVERSE SHOULDER ARTHROPLASTY Left 02/10/2021   Procedure: REVERSE SHOULDER ARTHROPLASTY;  Surgeon: Corky Mull, MD;  Location: [facility] ORS;  Service: Orthopedics;  Laterality: Left;   TONSILLECTOMY     UVULOPALATOPHARYNGOPLASTY      Prior to Admission medications   Medication Sig Start Date End Date Taking? Authorizing Provider  albuterol (VENTOLIN HFA) 108 (90 Base) MCG/ACT inhaler Inhale 1-2 puffs into the lungs every 6 (six) hours as needed for wheezing or shortness of breath.   Yes [provider]  alfuzosin (UROXATRAL) 10 MG 24 hr tablet Take 1 tablet (10 mg total) by mouth daily with breakfast. This replaces doxazosin 12/12/20  Yes [name], MD  aspirin EC 81 MG tablet Take 81 mg by mouth daily. Swallow whole.   Yes [provider]  colchicine 0.6 MG tablet Take 0.6  mg by mouth 2 (two) times daily.   Yes [provider]  cyanocobalamin (,VITAMIN B-12,) 1000 MCG/ML injection Inject 1,000 mcg into the muscle every 30 (thirty) days.   Yes [provider]  doxazosin (CARDURA) 4 MG tablet Take 4 mg by mouth at bedtime.   Yes [provider]  finasteride (PROSCAR) 5 MG tablet Take 1 tablet (5 mg total) by mouth daily. 12/12/20 12/07/21 Yes [name], MD  fluticasone-salmeterol (ADVAIR) 250-50 MCG/ACT AEPB Inhale 1 puff into the lungs in the morning and at bedtime.   Yes  [provider]  Fluticasone-Umeclidin-Vilant (TRELEGY ELLIPTA) 100-62.5-25 MCG/INH AEPB Inhale 1 puff into the lungs daily.   Yes [provider]  hyoscyamine (LEVSIN) 0.125 MG tablet Take 0.125 mg by mouth every 4 (four) hours as needed.   Yes [provider]  ibuprofen (ADVIL) 200 MG tablet Take 200 mg by mouth every 6 (six) hours as needed.   Yes [provider]  lisinopril (PRINIVIL,ZESTRIL) 20 MG tablet Take 20 mg by mouth 2 (two) times daily.   Yes [provider]  omeprazole (PRILOSEC) 20 MG capsule Take 20 mg by mouth daily.   Yes [provider]  simvastatin (ZOCOR) 20 MG tablet Take 10 mg by mouth at bedtime.   Yes [provider]  testosterone cypionate (DEPOTESTOSTERONE CYPIONATE) 200 MG/ML injection Inject 200 mg into the muscle every 14 (fourteen) days.   Yes [provider]  verapamil (CALAN-SR) 240 MG CR tablet Take 240 mg by mouth 2 (two) times daily.   Yes [provider]  sildenafil (REVATIO) 20 MG tablet Take 40-100 mg by mouth as needed (for ED).     [provider]    Allergies Patient has no known allergies.  Family History  Problem Relation Age of Onset   Prostate cancer Neg Hx    Bladder Cancer Neg Hx    Kidney cancer Neg Hx     Social History Social History   Tobacco Use   Smoking status: Every Day    Packs/day: 1.00    Years: 60.00    Pack years: 60.00    Types: Cigarettes   Smokeless tobacco: Never  Vaping Use   Vaping Use: Never used  Substance Use Topics   Alcohol use: Yes    Comment: occ WINE   Drug use: No    Review of Systems Level 5 caveat: Review of systems limited due to respiratory distress Constitutional: No fever Cardiovascular: Positive for chest pain. Respiratory: Positive for shortness of breath. Gastrointestinal: No vomiting. Neurological: Negative for headache  ____________________________________________   PHYSICAL EXAM:  VITAL  SIGNS: ED Triage Vitals  Enc Vitals Group     BP --      Pulse Rate 06/08/21 0219 (!) 119     Resp 06/08/21 0219 16     Temp 06/08/21 0219 98.9 F (37.2 C)     Temp Source 06/08/21 0219 Oral     SpO2 06/08/21 0218 98 %     Weight 06/08/21 0219 165 lb 5.5 oz (75 kg)     Height 06/08/21 0219 '5\' 6"'$  (1.676 m)     Head Circumference --      Peak Flow --      Pain Score 06/08/21 0219 9     Pain Loc --      Pain Edu? --      Excl. in [place]? --     Constitutional: Alert and oriented.  Uncomfortable appearing, acute respiratory  distress.   Eyes: Conjunctivae are normal.  Head: Atraumatic. Nose: No congestion/rhinnorhea. Mouth/Throat: Mucous membranes are moist.   Neck: Normal range of motion.  Cardiovascular: Tachycardic, regular rhythm. Grossly normal heart sounds.  Good peripheral circulation. Respiratory: Increased respiratory effort.  Rhonchi and wheezes bilaterally. Gastrointestinal: Soft and nontender. No distention.  Genitourinary: No flank tenderness. Musculoskeletal: No lower extremity edema.  Extremities warm and well perfused.  Neurologic: Motor intact in all extremities. Skin:  Skin is warm and dry. No rash noted. Psychiatric: Slightly anxious appearing.  ____________________________________________   LABS (all labs ordered are listed, but only abnormal results are displayed)  Labs Reviewed  COMPREHENSIVE METABOLIC PANEL - Abnormal; Notable for the following components:      Result Value   Potassium 5.6 (*)    CO2 21 (*)    Glucose, Bld 108 (*)    BUN 93 (*)    Creatinine, Ser 4.83 (*)    Calcium 8.4 (*)    Total Protein 6.2 (*)    Albumin 2.9 (*)    GFR, Estimated 11 (*)    All other components within normal limits  CBC WITH DIFFERENTIAL/PLATELET - Abnormal; Notable for the following components:   WBC 15.5 (*)    HCT 37.4 (*)    RDW 15.7 (*)    Neutro Abs 14.0 (*)    Lymphs Abs 0.5 (*)    Abs Immature Granulocytes 0.09 (*)    All other components within  normal limits  BRAIN NATRIURETIC PEPTIDE - Abnormal; Notable for the following components:   B Natriuretic Peptide 139.8 (*)    All other components within normal limits  BLOOD GAS, VENOUS - Abnormal; Notable for the following components:   pCO2, Ven 37 (*)    Bicarbonate 19.5 (*)    Acid-base deficit 5.8 (*)    All other components within normal limits  TROPONIN I (HIGH SENSITIVITY) - Abnormal; Notable for the following components:   Troponin I (High Sensitivity) 18 (*)    All other components within normal limits  RESP PANEL BY RT-PCR (FLU A&B, COVID) ARPGX2  CULTURE, BLOOD (ROUTINE X 2)  CULTURE, BLOOD (ROUTINE X 2)  PROCALCITONIN  LACTIC ACID, PLASMA  LACTIC ACID, PLASMA  URINALYSIS, COMPLETE (UACMP) WITH MICROSCOPIC  TROPONIN I (HIGH SENSITIVITY)   ____________________________________________  EKG  ED ECG REPORT I, Arta Silence, the attending physician, personally viewed and interpreted this ECG.  Date: 06/08/2021 EKG Time: 02 16 Rate: 115 Rhythm: Sinus tachycardia QRS Axis: normal Intervals: RBBB ST/T Wave abnormalities: Nonspecific ST abnormalities, inferior and lateral Narrative Interpretation: Nonspecific ST abnormalities with no evidence of acute ischemia; computer read is acute MI, however there are no elevations meeting STEMI criteria and the overall morphology is similar to prior EKG from 06/25/2019  ____________________________________________  RADIOLOGY  Chest x-ray interpreted by me shows right lung infiltrate consistent with pneumonia  ____________________________________________   PROCEDURES  Procedure(s) performed: No  Procedures  Critical Care performed: Yes  CRITICAL CARE Performed by: Arta Silence   Total critical care time: 35 minutes  Critical care time was exclusive of separately billable procedures and treating other patients.  Critical care was necessary to treat or prevent imminent or life-threatening  deterioration.  Critical care was time spent personally by me on the following activities: development of treatment plan with patient and/or surrogate as well as nursing, discussions with consultants, evaluation of patient's response to treatment, examination of patient, obtaining history from patient or surrogate, ordering and performing treatments and interventions, ordering and review  of laboratory studies, ordering and review of radiographic studies, pulse oximetry and re-evaluation of patient's condition.  ____________________________________________   INITIAL IMPRESSION / ASSESSMENT AND PLAN / ED COURSE  Pertinent labs & imaging results that were available during my care of the patient were reviewed by me and considered in my medical decision making (see chart for details).   87 year old male with PMH as noted above including COPD, CAD status post stenting, DM, hypertension presents with relatively acute onset of shortness of breath a few hours ago associated with right-sided chest pain.  I reviewed the past medical records in [place].  The patient was most recently admitted in May for a rotator cuff tear.  Previously he was admitted in January with urinary retention and UTI.  On exam currently, the patient presents in acute respiratory distress, with increased work of breathing, speaking in short sentences.  Per EMS, O2 saturation was in the low 80s on room air and CPAP was applied during transport.  The patient also received duo nebs and Solu-Medrol en route.  There are diffuse rhonchi and some scattered wheezes bilaterally.  There is no significant peripheral edema.  Neurologic exam is nonfocal.  Exam is otherwise as described above.  Differential includes COPD exacerbation, acute bronchitis, pneumonia, COVID-19, new onset CHF, or other cardiac cause.  I have a low suspicion for PE given the history of COPD and the abnormal breath sounds.  We will obtain a chest x-ray, lab work-up, continue  bronchodilators, and reassess.  I anticipate admission.  ----------------------------------------- 4:28 AM on 06/08/2021 -----------------------------------------  Work-up is consistent with pneumonia, with an infiltrate on x-ray and elevated WBC count.  I added on a lactate and blood cultures, and initiated empiric antibiotics for CAP.  The patient has significant improvement on the BiPAP and when I reassessed him he stated he was feeling very well.  His blood pressure dropped somewhat after getting morphine, however it normalized with fluids.  I consulted Dr. Sidney Ace from the hospitalist service for admission.   ____________________________________________   FINAL CLINICAL IMPRESSION(S) / ED DIAGNOSES  Final diagnoses:  Acute on chronic respiratory failure with hypoxia ([place])  Community acquired pneumonia of right lung, unspecified part of lung      NEW MEDICATIONS STARTED DURING THIS VISIT:  New Prescriptions   No medications on file     Note:  This document was prepared using Dragon voice recognition software and may include unintentional dictation errors.    Arta Silence, MD 06/08/21 [phone]

## 2021-06-08 NOTE — ED Notes (Signed)
Patient is resting comfortably. 

## 2021-06-08 NOTE — ED Triage Notes (Signed)
home acems, audible rhonchi; stats in the 80s; right side chest pain; 2 duoneb; '125mg'$  solumedrol; ST 110; tachnypneaic 26; 18g right forearm

## 2021-06-08 NOTE — ED Notes (Signed)
Pt was eating breakfast and began to report SOB and difficulty breathing. Pt's sats in 70's, pt sat up more in bed, placed on a simple mask to assist with oxygenation, and RT called. RT came to bedside and placed pt back on bipap. MD Blaine Hamper notified of need to hold PO medications and of pt's need for bipap at this time by this RN.

## 2021-06-08 NOTE — ED Notes (Signed)
Respiratory notified RN they reassessed the patient and are going to keep him on bipap for now will reassess in a couple of hours.

## 2021-06-08 NOTE — ED Notes (Signed)
Pt desatting to 79, set pt up, applied face mask at 8L/min w/Kara RN. Respiratory in room at this time putting pt back on bipap.

## 2021-06-08 NOTE — Progress Notes (Signed)
CODE SEPSIS - PHARMACY COMMUNICATION  **Broad Spectrum Antibiotics should be administered within 1 hour of Sepsis diagnosis**  Time Code Sepsis Called/Page Received: [id number]  Antibiotics Ordered: Azithromycin and Ceftriaxone  Time of 1st antibiotic administration: [place], PharmD, [facility] 06/08/2021 3:33 AM

## 2021-06-08 NOTE — ED Notes (Signed)
Patient is resting comfortably, eyes are closed respirations are even and regular on bipap.

## 2021-06-08 NOTE — ED Notes (Signed)
RT at bedside assessing this pt. This pt given a little water at this time.

## 2021-06-08 NOTE — H&P (Signed)
History and Physical    George [name] [id number] DOB: 02/22/1932 DOA: 06/08/2021  Referring MD/NP/PA:   PCP: Juluis Pitch, MD   Patient coming from:  The patient is coming from home.      Chief Complaint: SOB  HPI: ADDEN HOLLENBACH is a 87 y.o. male with medical history significant of CKD-IIIb, hypertension, hyperlipidemia, diabetes mellitus, COPD, GERD, gout, OSA, CAD, stent placement, colon cancer, BPH, anemia, tobacco abuse, who presents with shortness of breath.  Patient states that he started having shortness breath since yesterday evening, which has been progressively worsening.  Patient has cough with little mucus production.  Has audible wheezing.  No fever or chills.  Patient states that he had some right-sided chest pain earlier, which has resolved.  No nausea ,vomiting, diarrhea or abdominal pain.  No symptoms of UTI.  Patient states that he is not using oxygen normally.    He was found to have severe respiratory distress, with O2 desaturated to 73% on room air, and BiPAP is started in the ED.  ED Course: pt was found to have WBC 15.5, BNP 139, lactic acid 1.3 --> 1.1, troponin level 18 --> 14, negative COVID PCR, potassium 5.6, worsening renal function, temperature normal, blood pressure 78/45, which improved to 111/57 after giving 1 L normal saline bolus, heart rate 119, RR 25.  Chest x-ray showed right sided diffuse infiltration.  Renal ultrasound is negative for obstruction.  Patient is admitted to progressive bed as inpatient  Review of Systems:   General: no fevers, chills, no body weight gain, has poor appetite, has fatigue HEENT: no blurry vision, hearing changes or sore throat Respiratory: has dyspnea, coughing, wheezing CV: no chest pain, no palpitations GI: no nausea, vomiting, abdominal pain, diarrhea, constipation GU: no dysuria, burning on urination, increased urinary frequency, hematuria  Ext: no leg edema Neuro: no unilateral weakness, numbness, or  tingling, no vision change or hearing loss Skin: no rash, no skin tear. MSK: No muscle spasm, no deformity, no limitation of range of movement in spin Heme: No easy bruising.  Travel history: No recent long distant travel.  Allergy: No Known Allergies  Past Medical History:  Diagnosis Date   Anemia    Arthritis    BPH (benign prostatic hyperplasia)    Cancer (HCC)    COLON   COPD (chronic obstructive pulmonary disease) (HCC)    Coronary artery disease    WITH 1 STENT   Diabetes mellitus without complication (HCC)    Dyspnea    WITH EXERTION ONLY   GERD (gastroesophageal reflux disease)    Hypercholesteremia    Hypertension    Pneumonia    Sleep apnea    HAD UPPP    Past Surgical History:  Procedure Laterality Date   CARDIAC CATHETERIZATION     has a stent   COLON SURGERY     DUE TO COLON CANCER   COLONOSCOPY     MULTIPLE   EYE SURGERY Bilateral    CATARACT   HERNIA REPAIR     REVERSE SHOULDER ARTHROPLASTY Left 02/10/2021   Procedure: REVERSE SHOULDER ARTHROPLASTY;  Surgeon: Corky Mull, MD;  Location: [facility] ORS;  Service: Orthopedics;  Laterality: Left;   TONSILLECTOMY     UVULOPALATOPHARYNGOPLASTY      Social History:  reports that he has been smoking cigarettes. He has a 60.00 pack-year smoking history. He has never used smokeless tobacco. He reports current alcohol use. He reports that he does not use drugs.  Family History:  Family History  Problem Relation Age of Onset   Prostate cancer Neg Hx    Bladder Cancer Neg Hx    Kidney cancer Neg Hx      Prior to Admission medications   Medication Sig Start Date End Date Taking? Authorizing Provider  albuterol (VENTOLIN HFA) 108 (90 Base) MCG/ACT inhaler Inhale 1-2 puffs into the lungs every 6 (six) hours as needed for wheezing or shortness of breath.   Yes [provider]  alfuzosin (UROXATRAL) 10 MG 24 hr tablet Take 1 tablet (10 mg total) by mouth daily with breakfast. This replaces doxazosin 12/12/20   Yes [name], MD  aspirin EC 81 MG tablet Take 81 mg by mouth daily. Swallow whole.   Yes [provider]  colchicine 0.6 MG tablet Take 0.6 mg by mouth 2 (two) times daily.   Yes [provider]  cyanocobalamin (,VITAMIN B-12,) 1000 MCG/ML injection Inject 1,000 mcg into the muscle every 30 (thirty) days.   Yes [provider]  doxazosin (CARDURA) 4 MG tablet Take 4 mg by mouth at bedtime.   Yes [provider]  finasteride (PROSCAR) 5 MG tablet Take 1 tablet (5 mg total) by mouth daily. 12/12/20 12/07/21 Yes [name], MD  fluticasone-salmeterol (ADVAIR) 250-50 MCG/ACT AEPB Inhale 1 puff into the lungs in the morning and at bedtime.   Yes [provider]  Fluticasone-Umeclidin-Vilant (TRELEGY ELLIPTA) 100-62.5-25 MCG/INH AEPB Inhale 1 puff into the lungs daily.   Yes [provider]  hyoscyamine (LEVSIN) 0.125 MG tablet Take 0.125 mg by mouth every 4 (four) hours as needed.   Yes [provider]  ibuprofen (ADVIL) 200 MG tablet Take 200 mg by mouth every 6 (six) hours as needed.   Yes [provider]  lisinopril (PRINIVIL,ZESTRIL) 20 MG tablet Take 20 mg by mouth 2 (two) times daily.   Yes [provider]  omeprazole (PRILOSEC) 20 MG capsule Take 20 mg by mouth daily.   Yes [provider]  simvastatin (ZOCOR) 20 MG tablet Take 10 mg by mouth at bedtime.   Yes [provider]  testosterone cypionate (DEPOTESTOSTERONE CYPIONATE) 200 MG/ML injection Inject 200 mg into the muscle every 14 (fourteen) days.   Yes [provider]  verapamil (CALAN-SR) 240 MG CR tablet Take 240 mg by mouth 2 (two) times daily.   Yes [provider]  sildenafil (REVATIO) 20 MG tablet Take 40-100 mg by mouth as needed (for ED).     [provider]    Physical Exam: Vitals:   06/08/21 1043 06/08/21 1049 06/08/21 1100 06/08/21 1200  BP: (!) 112/93  (!) 96/50 119/63  Pulse: (!) 104 95  96 (!) 102  Resp: (!) 23 20 (!) 22 20  Temp:      TempSrc:      SpO2: 94% 95% 90% 96%  Weight:      Height:       General: in acute respiratory distress HEENT:       Eyes: PERRL, EOMI, no scleral icterus.       ENT: No discharge from the ears and nose, no pharynx injection, no tonsillar enlargement.        Neck: No JVD, no bruit, no mass felt. Heme: No neck lymph node enlargement. Cardiac: S1/S2, RRR, No murmurs, No gallops or rubs. Respiratory: Diffusely wheezing and rhonchi bilaterally GI: Soft, nondistended, nontender, no rebound pain, no organomegaly, BS present. GU: No hematuria Ext: No pitting leg edema bilaterally. 1+DP/PT pulse  bilaterally. Musculoskeletal: No joint deformities, No joint redness or warmth, no limitation of ROM in spin. Skin: No rashes.  Neuro: Alert, oriented X3, cranial nerves II-XII grossly intact, moves all extremities normally.  Psych: Patient is not psychotic, no suicidal or hemocidal ideation.  Labs on Admission: I have personally reviewed following labs and imaging studies  CBC: Recent Labs  Lab 06/08/21 0218  WBC 15.5*  NEUTROABS 14.0*  HGB 13.1  HCT 37.4*  MCV 88.6  PLT 123456   Basic Metabolic Panel: Recent Labs  Lab 06/08/21 0218 06/08/21 0853  NA 138 134*  K 5.6* 5.6*  CL 105 103  CO2 21* 16*  GLUCOSE 108* 157*  BUN 93* 95*  CREATININE 4.83* 4.57*  CALCIUM 8.4* 7.9*   GFR: Estimated Creatinine Clearance: 10.3 mL/min (A) (by C-G formula based on SCr of 4.57 mg/dL (H)). Liver Function Tests: Recent Labs  Lab 06/08/21 0218  AST 32  ALT 18  ALKPHOS 51  BILITOT 1.0  PROT 6.2*  ALBUMIN 2.9*   No results for input(s): LIPASE, AMYLASE in the last 168 hours. No results for input(s): AMMONIA in the last 168 hours. Coagulation Profile: No results for input(s): INR, PROTIME in the last 168 hours. Cardiac Enzymes: No results for input(s): CKTOTAL, CKMB, CKMBINDEX, TROPONINI in the last 168 hours. BNP (last 3 results) No  results for input(s): PROBNP in the last 8760 hours. HbA1C: No results for input(s): HGBA1C in the last 72 hours. CBG: No results for input(s): GLUCAP in the last 168 hours. Lipid Profile: No results for input(s): CHOL, HDL, LDLCALC, TRIG, CHOLHDL, LDLDIRECT in the last 72 hours. Thyroid Function Tests: No results for input(s): TSH, T4TOTAL, FREET4, T3FREE, THYROIDAB in the last 72 hours. Anemia Panel: No results for input(s): VITAMINB12, FOLATE, FERRITIN, TIBC, IRON, RETICCTPCT in the last 72 hours. Urine analysis:    Component Value Date/Time   COLORURINE YELLOW (A) 06/08/2021 0853   APPEARANCEUR HAZY (A) 06/08/2021 0853   APPEARANCEUR Clear 05/20/2021 1414   LABSPEC 1.018 06/08/2021 0853   PHURINE 5.0 06/08/2021 0853   GLUCOSEU NEGATIVE 06/08/2021 0853   HGBUR NEGATIVE 06/08/2021 0853   BILIRUBINUR NEGATIVE 06/08/2021 0853   BILIRUBINUR Negative 05/20/2021 1414   KETONESUR 5 (A) 06/08/2021 0853   PROTEINUR NEGATIVE 06/08/2021 0853   NITRITE NEGATIVE 06/08/2021 0853   LEUKOCYTESUR NEGATIVE 06/08/2021 0853   Sepsis Labs: '@LABRCNTIP'$ (procalcitonin:4,lacticidven:4) ) Recent Results (from the past 240 hour(s))  Resp Panel by RT-PCR (Flu A&B, Covid) Nasopharyngeal Swab     Status: None   Collection Time: 06/08/21  2:22 AM   Specimen: Nasopharyngeal Swab; Nasopharyngeal(NP) swabs in vial transport medium  Result Value Ref Range Status   SARS Coronavirus 2 by RT PCR NEGATIVE NEGATIVE Final    Comment: (NOTE) SARS-CoV-2 target nucleic acids are NOT DETECTED.  The SARS-CoV-2 RNA is generally detectable in upper respiratory specimens during the acute phase of infection. The lowest concentration of SARS-CoV-2 viral copies this assay can detect is 138 copies/mL. A negative result does not preclude SARS-Cov-2 infection and should not be used as the sole basis for treatment or other patient management decisions. A negative result may occur with  improper specimen collection/handling,  submission of specimen other than nasopharyngeal swab, presence of viral mutation(s) within the areas targeted by this assay, and inadequate number of viral copies(<138 copies/mL). A negative result must be combined with clinical observations, patient history, and epidemiological information. The expected result is Negative.  Fact Sheet for Patients:  EntrepreneurPulse.com.au  Fact Sheet for  Healthcare Providers:  IncredibleEmployment.be  This test is no t yet approved or cleared by the [place] and  has been authorized for detection and/or diagnosis of SARS-CoV-2 by FDA under an Emergency Use Authorization (EUA). This EUA will remain  in effect (meaning this test can be used) for the duration of the COVID-19 declaration under Section 564(b)(1) of the Act, 21 U.S.C.section 360bbb-3(b)(1), unless the authorization is terminated  or revoked sooner.       Influenza A by PCR NEGATIVE NEGATIVE Final   Influenza B by PCR NEGATIVE NEGATIVE Final    Comment: (NOTE) The Xpert Xpress SARS-CoV-2/FLU/RSV plus assay is intended as an aid in the diagnosis of influenza from Nasopharyngeal swab specimens and should not be used as a sole basis for treatment. Nasal washings and aspirates are unacceptable for Xpert Xpress SARS-CoV-2/FLU/RSV testing.  Fact Sheet for Patients: EntrepreneurPulse.com.au  Fact Sheet for Healthcare Providers: IncredibleEmployment.be  This test is not yet approved or cleared by the [place] FDA and has been authorized for detection and/or diagnosis of SARS-CoV-2 by FDA under an Emergency Use Authorization (EUA). This EUA will remain in effect (meaning this test can be used) for the duration of the COVID-19 declaration under Section 564(b)(1) of the Act, 21 U.S.C. section 360bbb-3(b)(1), unless the authorization is terminated or revoked.  Performed at [facility], [address] [place], [place] [address]   Blood Culture (routine x 2)     Status: None (Preliminary result)   Collection Time: 06/08/21  3:50 AM   Specimen: BLOOD  Result Value Ref Range Status   Specimen Description BLOOD RIGHT [facility]  Final   Special Requests   Final    BOTTLES DRAWN AEROBIC AND ANAEROBIC Blood Culture adequate volume   Culture   Final    NO GROWTH < 12 HOURS Performed at [facility], [address] [place], [place] [address]    Report Status PENDING  Incomplete  Blood Culture (routine x 2)     Status: None (Preliminary result)   Collection Time: 06/08/21  3:58 AM   Specimen: BLOOD  Result Value Ref Range Status   Specimen Description BLOOD RIGHT HAND  Final   Special Requests   Final    BOTTLES DRAWN AEROBIC AND ANAEROBIC Blood Culture adequate volume   Culture   Final    NO GROWTH < 12 HOURS Performed at [facility], [address] [place], [place] [address]    Report Status PENDING  Incomplete     Radiological Exams on Admission: US RENAL  Result Date: 06/08/2021 CLINICAL DATA:  Acute kidney injury EXAM: RENAL / URINARY TRACT ULTRASOUND COMPLETE COMPARISON:  Renal stone protocol CT 11/04/2020 FINDINGS: Right Kidney: Renal measurements: 10.4 x 4.9 x 5.3 cm = volume: 142 mL. Mild diffuse increased echogenicity of the parenchyma. No mass or hydronephrosis visualized. 1.7 cm simple cyst present the upper pole. Left Kidney: Renal measurements: 11.1 x 5.9 x 5.2 cm = volume: 178 mL. Mild diffuse increased echogenicity of the parenchyma. No mass or hydronephrosis visualized. 2.4 cm simple cyst present in the lower pole. Bladder: Evaluation limited due to collapse configuration. Other: Enlarged prostate partially visualized. IMPRESSION: Increased echogenicity of the renal cortex consistent with chronic medical renal disease. Electronically Signed   By: Miachel Roux M.D.   On: 06/08/2021 08:22   DG Chest Portable 1 View  Result Date:  06/08/2021 CLINICAL DATA:  Shortness of breath. EXAM: PORTABLE CHEST 1 VIEW COMPARISON:  Chest radiograph dated  11/04/2020. FINDINGS: Diffuse interstitial and airspace density involving the right lung, new since the prior radiograph most consistent with developing infiltrate. There is background of emphysema. Trace right pleural effusion may be present. No pneumothorax. Cardiac silhouette is within limits. Atherosclerotic calcification of the aorta. Left shoulder arthroplasty. No acute osseous pathology. IMPRESSION: 1. Right lung infiltrate most consistent with pneumonia. 2. Emphysema. Electronically Signed   By: Anner Crete M.D.   On: 06/08/2021 02:53     EKG: not done  in ED yet, will get one   Assessment/Plan Principal Problem:   CAP (community acquired pneumonia) Active Problems:   Tobacco abuse   CAD (coronary artery disease)   Hyperlipidemia   Hypertension   Type II diabetes mellitus with renal manifestations (HCC)   Acute kidney injury superimposed on CKD IIIb (HCC)   BPH with obstruction/lower urinary tract symptoms   Hyperkalemia   Acute on chronic respiratory failure with hypoxia (HCC)   Gout   Sepsis ([place])   COPD exacerbation (HCC)  Acute on chronic respiratory failure with hypoxia and sepsis due to CAP (community acquired pneumonia) and COPD exacerbation: Patient admits criteria for sepsis with WBC 15.5, tachycardia with heart rate 119, tachypnea with RR 25.  Lactic acid is normal.  Initially hypotensive with blood pressure 78/45, which improved to 111/57 after giving 1 L normal saline bolus.  -Admited to progressive unit as inpatient - IV Rocephin and azithromycin - Mucinex for cough  - Bronchodilators - Urine legionella and S. pneumococcal antigen - Follow up blood culture x2, sputum culture  - will get Procalcitonin and trend lactic acid level - IVF: 1L of NS bolus in ED, followed by 75 mL per hour of NS  - Solu-Medrol 40 mg twice daily - Incentive  spirometry  Tobacco abuse -Nicotine patch  CAD (coronary artery disease): Currently no chest pain, troponin level 18, 14 -Aspirin, Zocor  Hyperlipidemia -Zocor  Hypertension -Hold blood pressure medications due to hypotension  Diet-controlled diabetes, Type II diabetes mellitus with renal manifestations ([place]): Recent A1c 5.1, well controlled.  Blood sugar 108 -No treatment needed  Acute kidney injury superimposed on CKD IIIb [facility]): Baseline creatinine 1.51 on 02/12/2021.  His creatinine is 4.83, BUN 93.  Renal ultrasound is negative for obstruction.  Dr. Candiss Norse of nephrology is consulted -On IV fluid as above -Hold on lisinopril and ibuprofen  BPH with obstruction/lower urinary tract symptoms -Hold alfuzosin and Cardura due to hypotension -Continue Proscar  Hyperkalemia: Potassium 5.6 -Leukoma 10 g -Patient was treated with D10 and 5 units of NovoLog  Gout -Colchicine     DVT ppx: SQ Heparin    Code Status: DNR (I discussed the CODE STATUS with the patient and the presents of nurse, Cara, explained the meaning of full CODE STATUS to patient, patient want to be DNR).  Family Communication: I spoke with two of his friends by phone, Gregary Signs and frank.  Per Gregary Signs, patient has a son, but lost contact for more than 30 years.  I also have tried to call his "granddaughter" listed as contact person, it turns out that the number is wrong. Per Gregary Signs, the information is wrong, patient does not have granddaughter.  Disposition Plan:  Anticipate discharge back to previous environment Consults called: Dr. Candiss Norse of renal Admission status and Level of care: Progressive Cardiac:      progressive unit as inpt       Status is: Inpatient  Remains inpatient appropriate because:Inpatient level of care appropriate due to severity of illness  Dispo: The patient is from: Home              Anticipated d/c is to: to be dertermined              Patient currently is not medically stable to d/c.    Difficult to place patient No           Date of Service 06/08/2021    Ivor Costa [facility]   If 7PM-7AM, please contact night-coverage [email or web address] 06/08/2021, 1:01 PM

## 2021-06-08 NOTE — Consult Note (Signed)
[facility] Consult Note:06/08/2021    Date of Admission:  06/08/2021           Reason for Consult:     Referring Provider: Ivor Costa, MD Primary Care Provider: Juluis Pitch, MD   History of Presenting Illness:  George [name] is a 87 y.o. male  He was brought in from home by [facility] for chest pain.  Found to have low oxygen saturation and rhonchi.  Diagnosed with code sepsis in the ER.  Chest x-ray showed right lower lobe pneumonia.  Started on antibiotics azithromycin and ceftriaxone. Requiring BiPAP off and on Confused at present.  Not able to provide any meaningful information Nephrology consult has been requested for AKI Baseline creatinine of 1.51, GFR 44 Acute increase in creatinine noted at 4.83 which has slightly decreased to 4.57 this morning Potassium is elevated at 5.6   Review of Systems: ROS  Past Medical History:  Diagnosis Date   Anemia    Arthritis    BPH (benign prostatic hyperplasia)    Cancer (HCC)    COLON   COPD (chronic obstructive pulmonary disease) (HCC)    Coronary artery disease    WITH 1 STENT   Diabetes mellitus without complication (HCC)    Dyspnea    WITH EXERTION ONLY   GERD (gastroesophageal reflux disease)    Hypercholesteremia    Hypertension    Pneumonia    Sleep apnea    HAD UPPP    Social History   Tobacco Use   Smoking status: Every Day    Packs/day: 1.00    Years: 60.00    Pack years: 60.00    Types: Cigarettes   Smokeless tobacco: Never  Vaping Use   Vaping Use: Never used  Substance Use Topics   Alcohol use: Yes    Comment: occ WINE   Drug use: No    Family History  Problem Relation Age of Onset   Prostate cancer Neg Hx    Bladder Cancer Neg Hx    Kidney cancer Neg Hx      OBJECTIVE: Blood pressure 119/63, pulse (!) 102, temperature 98.9 F (37.2 C), temperature source Oral, resp. rate 20, height '5\' 6"'$  (1.676 m), weight 75 kg, SpO2 96 %.  Physical  Exam Physical Exam: General: Ill-appearing, laying in the bed  HEENT BiPAP mask in place  Pulm/lungs Bilateral rhonchi and crackles, NIPPV  CVS/Heart  irregular rhythm,  Abdomen:   Soft, nontender  Extremities:  No peripheral edema  Neurologic: Lethargic, not able to follow commands  Skin:  No acute rashes     Lab Results Lab Results  Component Value Date   WBC 15.5 (H) 06/08/2021   HGB 13.1 06/08/2021   HCT 37.4 (L) 06/08/2021   MCV 88.6 06/08/2021   PLT 260 06/08/2021    Lab Results  Component Value Date   CREATININE 4.57 (H) 06/08/2021   BUN 95 (H) 06/08/2021   NA 134 (L) 06/08/2021   K 5.6 (H) 06/08/2021   CL 103 06/08/2021   CO2 16 (L) 06/08/2021    Lab Results  Component Value Date   ALT 18 06/08/2021   AST 32 06/08/2021   ALKPHOS 51 06/08/2021   BILITOT 1.0 06/08/2021     Microbiology: Recent Results (from the past 240 hour(s))  Resp Panel by RT-PCR (Flu A&B, Covid) Nasopharyngeal Swab     Status: None   Collection Time: 06/08/21  2:22 AM   Specimen: Nasopharyngeal Swab; Nasopharyngeal(NP) swabs in  vial transport medium  Result Value Ref Range Status   SARS Coronavirus 2 by RT PCR NEGATIVE NEGATIVE Final    Comment: (NOTE) SARS-CoV-2 target nucleic acids are NOT DETECTED.  The SARS-CoV-2 RNA is generally detectable in upper respiratory specimens during the acute phase of infection. The lowest concentration of SARS-CoV-2 viral copies this assay can detect is 138 copies/mL. A negative result does not preclude SARS-Cov-2 infection and should not be used as the sole basis for treatment or other patient management decisions. A negative result may occur with  improper specimen collection/handling, submission of specimen other than nasopharyngeal swab, presence of viral mutation(s) within the areas targeted by this assay, and inadequate number of viral copies(<138 copies/mL). A negative result must be combined with clinical observations, patient history, and  epidemiological information. The expected result is Negative.  Fact Sheet for Patients:  EntrepreneurPulse.com.au  Fact Sheet for Healthcare Providers:  IncredibleEmployment.be  This test is no t yet approved or cleared by the [place] FDA and  has been authorized for detection and/or diagnosis of SARS-CoV-2 by FDA under an Emergency Use Authorization (EUA). This EUA will remain  in effect (meaning this test can be used) for the duration of the COVID-19 declaration under Section 564(b)(1) of the Act, 21 U.S.C.section 360bbb-3(b)(1), unless the authorization is terminated  or revoked sooner.       Influenza A by PCR NEGATIVE NEGATIVE Final   Influenza B by PCR NEGATIVE NEGATIVE Final    Comment: (NOTE) The Xpert Xpress SARS-CoV-2/FLU/RSV plus assay is intended as an aid in the diagnosis of influenza from Nasopharyngeal swab specimens and should not be used as a sole basis for treatment. Nasal washings and aspirates are unacceptable for Xpert Xpress SARS-CoV-2/FLU/RSV testing.  Fact Sheet for Patients: EntrepreneurPulse.com.au  Fact Sheet for Healthcare Providers: IncredibleEmployment.be  This test is not yet approved or cleared by the [place] FDA and has been authorized for detection and/or diagnosis of SARS-CoV-2 by FDA under an Emergency Use Authorization (EUA). This EUA will remain in effect (meaning this test can be used) for the duration of the COVID-19 declaration under Section 564(b)(1) of the Act, 21 U.S.C. section 360bbb-3(b)(1), unless the authorization is terminated or revoked.  Performed at [name], [address] [place], [place] [address]   Blood Culture (routine x 2)     Status: None (Preliminary result)   Collection Time: 06/08/21  3:50 AM   Specimen: BLOOD  Result Value Ref Range Status   Specimen Description BLOOD RIGHT [facility]  Final   Special Requests    Final    BOTTLES DRAWN AEROBIC AND ANAEROBIC Blood Culture adequate volume   Culture   Final    NO GROWTH < 12 HOURS Performed at [facility], [address], [place], [place] [address]    Report Status PENDING  Incomplete  Blood Culture (routine x 2)     Status: None (Preliminary result)   Collection Time: 06/08/21  3:58 AM   Specimen: BLOOD  Result Value Ref Range Status   Specimen Description BLOOD RIGHT HAND  Final   Special Requests   Final    BOTTLES DRAWN AEROBIC AND ANAEROBIC Blood Culture adequate volume   Culture   Final    NO GROWTH < 12 HOURS Performed at [facility], [address], [place], [place] [address]    Report Status PENDING  Incomplete    Medications: Scheduled Meds:  aspirin EC  81 mg Oral Daily   colchicine  0.6 mg Oral BID   finasteride  5 mg Oral Daily   heparin  5,000 Units Subcutaneous Q8H   ipratropium-albuterol  3 mL Nebulization Q4H   methylPREDNISolone (SOLU-MEDROL) injection  40 mg Intravenous Q12H   mometasone-formoterol  2 puff Inhalation BID   nicotine  21 mg Transdermal Daily   pantoprazole  40 mg Oral Daily   simvastatin  10 mg Oral QHS   Continuous Infusions:  sodium chloride 75 mL/hr at 06/08/21 1118   azithromycin Stopped (06/08/21 [id number])   cefTRIAXone (ROCEPHIN)  IV Stopped (06/08/21 [id number])   PRN Meds:.acetaminophen, albuterol, dextromethorphan-guaiFENesin, hydrALAZINE, hyoscyamine, ondansetron (ZOFRAN) IV  No Known Allergies  Urinalysis: Recent Labs    06/08/21 0853  COLORURINE YELLOW*  LABSPEC 1.018  PHURINE 5.0  GLUCOSEU NEGATIVE  HGBUR NEGATIVE  BILIRUBINUR NEGATIVE  KETONESUR 5*  PROTEINUR NEGATIVE  NITRITE NEGATIVE  LEUKOCYTESUR NEGATIVE      Imaging: US RENAL  Result Date: 06/08/2021 CLINICAL DATA:  Acute kidney injury EXAM: RENAL / URINARY TRACT ULTRASOUND COMPLETE COMPARISON:  Renal stone protocol CT 11/04/2020 FINDINGS: Right Kidney: Renal measurements: 10.4 x 4.9 x 5.3 cm = volume:  142 mL. Mild diffuse increased echogenicity of the parenchyma. No mass or hydronephrosis visualized. 1.7 cm simple cyst present the upper pole. Left Kidney: Renal measurements: 11.1 x 5.9 x 5.2 cm = volume: 178 mL. Mild diffuse increased echogenicity of the parenchyma. No mass or hydronephrosis visualized. 2.4 cm simple cyst present in the lower pole. Bladder: Evaluation limited due to collapse configuration. Other: Enlarged prostate partially visualized. IMPRESSION: Increased echogenicity of the renal cortex consistent with chronic medical renal disease. Electronically Signed   By: Miachel Roux M.D.   On: 06/08/2021 08:22   DG Chest Portable 1 View  Result Date: 06/08/2021 CLINICAL DATA:  Shortness of breath. EXAM: PORTABLE CHEST 1 VIEW COMPARISON:  Chest radiograph dated 11/04/2020. FINDINGS: Diffuse interstitial and airspace density involving the right lung, new since the prior radiograph most consistent with developing infiltrate. There is background of emphysema. Trace right pleural effusion may be present. No pneumothorax. Cardiac silhouette is within limits. Atherosclerotic calcification of the aorta. Left shoulder arthroplasty. No acute osseous pathology. IMPRESSION: 1. Right lung infiltrate most consistent with pneumonia. 2. Emphysema. Electronically Signed   By: Anner Crete M.D.   On: 06/08/2021 02:53      Assessment/Plan:  George [name] is a 87 y.o. male with medical problems of  CAD, CKD, BPH, Emphysema    was admitted on 06/08/2021 for :  Acute respiratory failure ([place]) [J96.00] CAP (community acquired pneumonia) [J18.9]   # AKI -AKI likely secondary to ATN from ongoing sepsis -Urinalysis negative for protein or blood -Renal ultrasound shows echogenic kidneys, enlarged prostate, no obstruction  -Although patient does appear somewhat dry, he has tenuous respiratory situation.  He is requiring BiPAP.  Would decrease IV fluid administration to lower dose of 35 cc/h to prevent  respiratory distress/volume overload. -Recommend palliative care to discuss goals of care  # CKD st 3b -Baseline creatinine of 1.51/GFR 44 from Feb 12, 2021  # Hyperkalemia -Can consider oral Veltassa/Lokelma -If unable to take oral medications, consider IV glucose followed by IV insulin  # Acute on Chronic resp Failure, # Sepsis -Patient has underlying severe emphysema -Right lower lobe pneumonia at present Antibiotics as per primary team      Jazmen Lindenbaum 06/08/21

## 2021-06-08 NOTE — ED Notes (Signed)
Pt family-friend updated and notified per pt and family requests.

## 2021-06-08 NOTE — ED Notes (Signed)
Patient complains of pain all over, moaning.  RN gave tylenol since BP dropped after Percocet.  Will reassess.

## 2021-06-08 NOTE — Sepsis Progress Note (Signed)
Following per sepsis protocol   

## 2021-06-08 NOTE — Sepsis Progress Note (Signed)
Full amount of fluids based off of sepsis protocol not given due to volume overload.

## 2021-06-08 NOTE — ED Notes (Signed)
Pt is off Bipap, pt is tolerating nasal canula at 6L at 93%

## 2021-06-08 NOTE — Progress Notes (Signed)
PT Cancellation Note  Patient Details Name: George [name] MRN: [id number] DOB: Sep 16, 1932   Cancelled Treatment:    Reason Eval/Treat Not Completed: Medical issues which prohibited therapy: Pt's K 5.6 falling outside guidelines for participation with PT services.  Will attempt to see pt at a future date/time as medically appropriate.     Linus Salmons PT, DPT 06/08/21, 3:40 PM

## 2021-06-08 NOTE — ED Notes (Signed)
This RN noted this pt satting in the 80's on the monitor, this RN went into room to see [place] at bedside, and pt sitting up straight in bed being directed to take deep breaths in through their nose. This RN reiterated for this pt to take deep breaths in through their nose and went to grab nebulizer treatments to assist with better oxygenation.

## 2021-06-08 NOTE — ED Notes (Signed)
Secure chat sent to MD patient is requesting something for pain.

## 2021-06-08 NOTE — ED Notes (Signed)
Patient placed on 6L [place] by respiratory, oxygen saturation is 92-94% at this time. Will monitor.

## 2021-06-09 LAB — BASIC METABOLIC PANEL
Anion gap: 15 (ref 5–15)
BUN: 115 mg/dL — ABNORMAL HIGH (ref 8–23)
CO2: 17 mmol/L — ABNORMAL LOW (ref 22–32)
Calcium: 7.9 mg/dL — ABNORMAL LOW (ref 8.9–10.3)
Chloride: 105 mmol/L (ref 98–111)
Creatinine, Ser: 4.07 mg/dL — ABNORMAL HIGH (ref 0.61–1.24)
GFR, Estimated: 14 mL/min — ABNORMAL LOW (ref >60)
Glucose, Bld: 136 mg/dL — ABNORMAL HIGH (ref 70–99)
Potassium: 5.2 mmol/L — ABNORMAL HIGH (ref 3.5–5.1)
Sodium: 137 mmol/L (ref 135–145)

## 2021-06-09 LAB — LEGIONELLA PNEUMOPHILA SEROGP 1 UR AG: L. pneumophila Serogp 1 Ur Ag: NEGATIVE

## 2021-06-09 LAB — CBC
HCT: 38.3 % — ABNORMAL LOW (ref 39.0–52.0)
Hemoglobin: 12.9 g/dL — ABNORMAL LOW (ref 13.0–17.0)
MCH: 30 pg (ref 26.0–34.0)
MCHC: 33.7 g/dL (ref 30.0–36.0)
MCV: 89.1 fL (ref 80.0–100.0)
Platelets: 234 10*3/uL (ref 150–400)
RBC: 4.3 MIL/uL (ref 4.22–5.81)
RDW: 15.8 % — ABNORMAL HIGH (ref 11.5–15.5)
WBC: 18.3 10*3/uL — ABNORMAL HIGH (ref 4.0–10.5)
nRBC: 0 % (ref 0.0–0.2)

## 2021-06-09 LAB — GLUCOSE, CAPILLARY: Glucose-Capillary: 119 mg/dL — ABNORMAL HIGH (ref 70–99)

## 2021-06-09 MED ORDER — SODIUM CHLORIDE 0.9 % IV SOLN
3.0000 g | Freq: Two times a day (BID) | INTRAVENOUS | Status: DC
  Administered 2021-06-09 – 2021-06-10 (×2): 3 g via INTRAVENOUS
  Filled 2021-06-09 (×2): qty 8
  Filled 2021-06-09: qty 3

## 2021-06-09 MED ORDER — COLCHICINE 0.6 MG PO TABS
0.3000 mg | ORAL_TABLET | Freq: Every day | ORAL | Status: DC
  Administered 2021-06-10 – 2021-06-16 (×7): 0.3 mg via ORAL
  Filled 2021-06-09: qty 0.5
  Filled 2021-06-09: qty 1
  Filled 2021-06-09 (×3): qty 0.5
  Filled 2021-06-09: qty 1
  Filled 2021-06-09 (×3): qty 0.5

## 2021-06-09 NOTE — Evaluation (Signed)
Occupational Therapy Evaluation Patient Details Name: George [name] MRN: [id number] DOB: [birth year] Today's Date: 06/09/2021    History of Present Illness Pt is an 87 y.o. male with medical history significant of CKD-IIIb, hypertension, hyperlipidemia, diabetes mellitus, COPD, GERD, gout, OSA, CAD, stent placement, colon cancer, BPH, anemia, tobacco abuse, and per patient L TSA April 2022 who presented with shortness of breath. MD assessment includes: Sepsis secondary to community-acquired pneumonia and COPD exacerbation/acute hypoxic respiratory failure, AKI, and hyperkalemia.   Clinical Impression   Pt seen for OT evaluation this date. Upon arrival to room, pt finishing session with PT. Per PT, pt unable to tolerate sitting EOB for more than 1 min d/t dizziness. Pt agreeable to OT evaluation and bed-level ADLs. Prior to admission, pt was independent in ADLs and functional mobility of household distances, living in a 1 story home alone. At baseline, PCA assists with IADLs for 2-3 hours/day.   Pt currently requires SUPERVISION for grooming tasks and SUPERVISION for LB dressing while in high fowler's position in bed due to current functional impairments (See OT Problem List below). Of note, following oral care and donning/doffing socks, pt reporting feeling fatigued; after each task, SpO2 decrease to 90% (able to increase to 92% following initiation of PLB) and HR 110s. Pt would benefit from additional skilled OT services to maximize return to PLOF and minimize risk of future falls, injury, caregiver burden, and readmission. Upon discharge, recommend SNF.        Follow Up Recommendations  SNF    Equipment Recommendations  None recommended by OT (pt has all DME equipment)       Precautions / Restrictions Precautions Precautions: Fall Restrictions Weight Bearing Restrictions: No      Mobility Bed Mobility Overal bed mobility: Needs Assistance Bed Mobility: Rolling Rolling: Supervision    Supine to sit: Min assist Sit to supine: Min assist   General bed mobility comments: Requires verbal cues for positioning    Transfers                 General transfer comment: Unable to attempt d/t pt feeling dizzy while sitting EOB with PT    Balance Overall balance assessment: Needs assistance Sitting-balance support: Bilateral upper extremity supported;Feet unsupported Sitting balance-Leahy Scale: Fair         Standing balance comment: NT                           ADL either performed or assessed with clinical judgement   ADL Overall ADL's : Needs assistance/impaired     Grooming: Wash/dry face;Oral care;Supervision/safety;Set up;Bed level Grooming Details (indicate cue type and reason): In high-fowlers position             Lower Body Dressing: Supervision/safety;Bed level Lower Body Dressing Details (indicate cue type and reason): to don/doff socks in figure-4 position. Pt able to complete with increased time/effort however reporting feeling fatigued following task.                   Pertinent Vitals/Pain Pain Assessment: No/denies pain     Hand Dominance Right   Extremity/Trunk Assessment Upper Extremity Assessment Upper Extremity Assessment: RUE deficits/detail;LUE deficits/detail RUE Deficits / Details: Strength WFL RUE Sensation: WNL LUE Deficits / Details: Shoulder unable to reach MMT test position d/t recent total shoulder arthroplasty (May 2022). Elbow and grip stregnth WFL LUE Sensation: WNL   Lower Extremity Assessment Lower Extremity Assessment: Generalized weakness  Communication Communication Communication: HOH   Cognition Arousal/Alertness: Awake/alert Behavior During Therapy: WFL for tasks assessed/performed Overall Cognitive Status: No family/caregiver present to determine baseline cognitive functioning                                 General Comments: Pt able to follow commands and  provide history but repeated information frequently during the session.              Home Living Family/patient expects to be discharged to:: Private residence Living Arrangements: Alone Available Help at Discharge: Personal care attendant;Available PRN/intermittently Type of Home: House Home Access: Stairs to enter [employer] of Steps: 5 Entrance Stairs-Rails: Can reach both;Right;Left Home Layout: Able to live on main level with bedroom/bathroom     Bathroom Shower/Tub: Tub/shower unit         Home Equipment: Cane - single point;Walker - 2 wheels;Bedside commode;Tub bench   Additional Comments: PCA 2-3 hours/day, 7days/wk      Prior Functioning/Environment Level of Independence: Needs assistance  Gait / Transfers Assistance Needed: Ind amb in the house without an AD, SPC for very limited ambulation outdoors or in community, 2 falls in the last 6-12 months secondary to tripping ADL's / Homemaking Assistance Needed: Independent with ADLs. PCA assists with IADLs            OT Problem List: Decreased strength;Decreased activity tolerance;Impaired balance (sitting and/or standing);Decreased safety awareness;Decreased knowledge of precautions;Impaired UE functional use      OT Treatment/Interventions: Self-care/ADL training;Therapeutic exercise;Energy conservation;DME and/or AE instruction;Therapeutic activities;Patient/family education;Balance training    OT Goals(Current goals can be found in the care plan section) Acute Rehab OT Goals Patient Stated Goal: To be stronger OT Goal Formulation: With patient Time For Goal Achievement: 06/23/21 Potential to Achieve Goals: Good ADL Goals Pt Will Perform Grooming: with supervision;with set-up;sitting Pt Will Perform Lower Body Dressing: with min guard assist;sitting/lateral leans Pt Will Transfer to Toilet: with min guard assist;stand pivot transfer;bedside commode  OT Frequency: Min 1X/week    AM-PAC OT "6  Clicks" Daily Activity     Outcome Measure Help from another person eating meals?: None Help from another person taking care of personal grooming?: A Little Help from another person toileting, which includes using toliet, bedpan, or urinal?: A Lot Help from another person bathing (including washing, rinsing, drying)?: A Lot Help from another person to put on and taking off regular upper body clothing?: A Little Help from another person to put on and taking off regular lower body clothing?: A Little 6 Click Score: 17   End of Session Equipment Utilized During Treatment: Gait belt;Rolling walker Nurse Communication: Mobility status  Activity Tolerance: Other (comment) (limited to dizziness with change in position) Patient left: in bed;with call bell/phone within reach;with bed alarm set  OT Visit Diagnosis: Unsteadiness on feet (R26.81);Muscle weakness (generalized) (M62.81);History of falling (Z91.81)                Time: 1540-1606 OT Time Calculation (min): 26 min Charges:  OT General Charges $OT Visit: 1 Visit OT Evaluation $OT Eval Moderate Complexity: 1 Mod OT Treatments $Self Care/Home Management : 8-22 mins  Fredirick Maudlin, OTR/L [employer]

## 2021-06-09 NOTE — Consult Note (Signed)
Pharmacy Antibiotic Note  George [name] is a 87 y.o. male admitted on 06/08/2021 with community-acquired  Aspiration PNA . Patient received 1 dose of ceftriaxone 2 gram.  Pharmacy has been consulted for Unasyn dosing.  Scr 4.07 (baseline ~ 1.6)   Plan: Initiate Unasyn 3 gram Q12H tonight at 2200 based on current renal function and ceftriaxone 2 gram dose given this AM  Monitor renal function for dose adjustments  Height: '5\' 6"'$  (167.6 cm) Weight: 75 kg (165 lb 5.5 oz) IBW/kg (Calculated) : 63.8  No data recorded.  Recent Labs  Lab 06/08/21 0218 06/08/21 0358 06/08/21 0607 06/08/21 0853 06/09/21 0638  WBC 15.5*  --   --   --  18.3*  CREATININE 4.83*  --   --  4.57* 4.07*  LATICACIDVEN  --  1.3 1.1  --   --     Estimated Creatinine Clearance: 11.5 mL/min (A) (by C-G formula based on SCr of 4.07 mg/dL (H)).    No Known Allergies  Antimicrobials this admission: 8/30 ceftriaxone >> x 1  8/30 azithromycin >>  8/30 Unasyn >>  Dose adjustments this admission: N/a  Microbiology results: 8/29 BCx: NGTD  8/29 Sputum: sent     Thank you for allowing pharmacy to be a part of this patient's care.  Dorothe Pea, PharmD, BCPS Clinical Pharmacist   06/09/2021 11:42 AM

## 2021-06-09 NOTE — ED Notes (Signed)
Pt eating breakfast tray at this time,

## 2021-06-09 NOTE — ED Notes (Signed)
Pt continuously taking off Axis to blow nose. Sats drop into 80s each time.

## 2021-06-09 NOTE — Evaluation (Signed)
Physical Therapy Evaluation Patient Details Name: George [name] MRN: [id number] DOB: [birth year] Today's Date: 06/09/2021   History of Present Illness  Pt is an 87 y.o. male with medical history significant of CKD-IIIb, hypertension, hyperlipidemia, diabetes mellitus, COPD, GERD, gout, OSA, CAD, stent placement, colon cancer, BPH, anemia, tobacco abuse, and per patient L TSA April 2022 who presented with shortness of breath. MD assessment includes: Sepsis secondary to community-acquired pneumonia and COPD exacerbation/acute hypoxic respiratory failure, AKI, and hyperkalemia.   Clinical Impression  Pt was pleasant and motivated to participate during the session and put forth good effort.  Pt required physical assistance with bed mobility tasks and once coming to sitting at the EOB reported feeling weak and dizzy.  Pt requested return to supine secondary to inability to tolerate remaining in the seated position with BP taken at 128/46, nsg notified.  Pt will benefit from PT services in a SNF setting upon discharge to safely address deficits listed in patient problem list for decreased caregiver assistance and eventual return to PLOF.      Follow Up Recommendations SNF;Supervision/Assistance - 24 hour    Equipment Recommendations  None recommended by PT    Recommendations for Other Services       Precautions / Restrictions Precautions Precautions: Fall Restrictions Weight Bearing Restrictions: No      Mobility  Bed Mobility Overal bed mobility: Needs Assistance Bed Mobility: Supine to Sit;Sit to Supine     Supine to sit: Min assist Sit to supine: Min assist   General bed mobility comments: Min A for BLE and trunk control    Transfers                 General transfer comment: Pt declined to attempt secondary to feeling dizzy and weak, requested to return to supine after sitting 30 to 45 sec  Ambulation/Gait                Stairs            Wheelchair  Mobility    Modified Rankin (Stroke Patients Only)       Balance Overall balance assessment: Needs assistance Sitting-balance support: Bilateral upper extremity supported;Feet unsupported Sitting balance-Leahy Scale: Fair         Standing balance comment: NT                             Pertinent Vitals/Pain Pain Assessment: No/denies pain    Home Living Family/patient expects to be discharged to:: Private residence Living Arrangements: Alone Available Help at Discharge: Personal care attendant;Available PRN/intermittently Type of Home: House Home Access: Stairs to enter Entrance Stairs-Rails: Can reach both;Right;Left Entrance Stairs-Number of Steps: 5 Home Layout: Able to live on main level with bedroom/bathroom Home Equipment: Cane - single point;Walker - 2 wheels;Bedside commode;Shower seat Additional Comments: PCA 2-3 hours/day, 7days/wk    Prior Function Level of Independence: Needs assistance   Gait / Transfers Assistance Needed: Ind amb in the house without an AD, SPC for very limited ambulation outdoors or in community, 2 falls in the last 6-12 months secondary to tripping  ADL's / Homemaking Assistance Needed: PCA assists with errands, cooking, and cleaning        Hand Dominance   Dominant Hand: Right    Extremity/Trunk Assessment   Upper Extremity Assessment Upper Extremity Assessment: Generalized weakness    Lower Extremity Assessment Lower Extremity Assessment: Generalized weakness       Communication  Communication: HOH  Cognition Arousal/Alertness: Awake/alert Behavior During Therapy: WFL for tasks assessed/performed Overall Cognitive Status: No family/caregiver present to determine baseline cognitive functioning                                 General Comments: Pt able to follow commands and provide history but repeated information frequently during the session      General Comments      Exercises   Bridging x 5 with small amplitude, rolling L/R for activity tolerance and core strengthening, supine heel slides x 5   Assessment/Plan    PT Assessment Patient needs continued PT services  PT Problem List Decreased strength;Decreased activity tolerance;Decreased balance;Decreased mobility;Decreased knowledge of use of DME       PT Treatment Interventions DME instruction;Gait training;Stair training;Functional mobility training;Therapeutic activities;Therapeutic exercise;Balance training;Patient/family education    PT Goals (Current goals can be found in the Care Plan section)  Acute Rehab PT Goals Patient Stated Goal: To be stronger PT Goal Formulation: With patient Time For Goal Achievement: 06/22/21 Potential to Achieve Goals: Fair    Frequency Min 2X/week   Barriers to discharge Inaccessible home environment;Decreased caregiver support      Co-evaluation               AM-PAC PT "6 Clicks" Mobility  Outcome Measure Help needed turning from your back to your side while in a flat bed without using bedrails?: A Little Help needed moving from lying on your back to sitting on the side of a flat bed without using bedrails?: A Little Help needed moving to and from a bed to a chair (including a wheelchair)?: A Lot Help needed standing up from a chair using your arms (e.g., wheelchair or bedside chair)?: A Lot Help needed to walk in hospital room?: A Lot Help needed climbing 3-5 steps with a railing? : Total 6 Click Score: 13    End of Session Equipment Utilized During Treatment: Gait belt Activity Tolerance: Other (comment) (limited by dizziness in sitting) Patient left: in bed;Other (comment) (Pt left with OT for OT evaluation) Nurse Communication: Mobility status PT Visit Diagnosis: History of falling (Z91.81);Difficulty in walking, not elsewhere classified (R26.2);Muscle weakness (generalized) (M62.81)    Time: [id number] PT Time Calculation (min) (ACUTE ONLY): 37  min   Charges:   PT Evaluation $PT Eval Moderate Complexity: 1 Mod PT Treatments $Therapeutic Activity: 8-22 mins       D. Scott Tedford Berg PT, DPT 06/09/21, 4:02 PM

## 2021-06-09 NOTE — Progress Notes (Signed)
PROGRESS NOTE    George [name]  [id number] DOB: 1932/09/27 DOA: 06/08/2021 PCP: Juluis Pitch, MD   Brief Narrative: Taken from H&P.   Subjective: Patient was seen and examined today.  Very hard of hearing.  Hearing aid on discharge.  Denies any pain.  Continue to have some cough and congestion.  Unable to explain whether he was experiencing more cough while eating or drinking or not.  Assessment & Plan:   Principal Problem:   CAP (community acquired pneumonia) Active Problems:   Tobacco abuse   CAD (coronary artery disease)   Hyperlipidemia   Hypertension   Type II diabetes mellitus with renal manifestations (HCC)   Acute kidney injury superimposed on CKD IIIb (HCC)   BPH with obstruction/lower urinary tract symptoms   Hyperkalemia   Acute on chronic respiratory failure with hypoxia (HCC)   Gout   Sepsis ([place])   COPD exacerbation (HCC)  Sepsis secondary to community-acquired pneumonia and COPD exacerbation/acute hypoxic respiratory failure.  Patient met sepsis criteria with fever, leukocytosis, tachycardia and tachypnea.  Endorgan damage with AKI.  Hypotensive which improved with IV fluid.  Imaging concerning for right lung pneumonia.Strep pneumonia negative.  Blood cultures negative so far.  Procalcitonin elevated at 13.20.  He was started on ceftriaxone and Zithromax. -Based on the location of infiltrate-advanced age and mild underlying cognitive impairment he is high risk for aspiration.  Unable to answer any questions regarding coughing while eating.  Unable to reach the family. -Swallow evaluation -Switch antibiotics to Unasyn -Continue with Zithromax -Follow Legionella and sputum culture labs -Continue with supplemental oxygen-wean as tolerated -Continue with Solu-Medrol -Continue with supportive care  AKI with CKD stage IIIb.  Baseline creatinine appears to be around 1.5.  It was 4.83 on admission.  Nephrology was consulted.  Creatinine slowly improving with  IV fluid. -Continue with gentle hydration -Monitor renal function -Continue holding lisinopril and ibuprofen  CAD (coronary artery disease): Currently no chest pain, troponin level 18, 14 -Aspirin, Zocor   Hyperlipidemia -Zocor   Hypertension.  Blood pressure within goal.  Antihypertensives initially held due to softer blood pressure. -Continue to monitor. -Holding home lisinopril due to AKI. -Can start home dose of verapamil if needed.  Tobacco abuse -Nicotine patch a.m.  Diet-controlled diabetes, Type II diabetes mellitus with renal manifestations [facility]): Recent A1c 5.1, well controlled.  Blood sugar within goal. -No treatment needed -Can start SSI if needed as he is on steroid -Monitor CBG  BPH with obstruction/lower urinary tract symptoms -Hold alfuzosin and Cardura due to hypotension -Continue Proscar   Hyperkalemia: Potassium 5.6.>>5.3 0.1 -Leukoma 10 g -Patient was treated with D10 and 5 units of NovoLog   Gout -Colchicine-dose adjusted according to the renal function.   Objective: Vitals:   06/09/21 1130 06/09/21 1200 06/09/21 1230 06/09/21 1400  BP:  139/77 (!) 101/58 118/69  Pulse: 98  88 92  Resp:   17 18  Temp:    97.8 F (36.6 C)  TempSrc:    Oral  SpO2: 91% 90% 94% 97%  Weight:      Height:        Intake/Output Summary (Last 24 hours) at 06/09/2021 1426 Last data filed at 06/09/2021 0800 Gross per 24 hour  Intake --  Output 1 ml  Net -1 ml   Filed Weights   06/08/21 0219  Weight: 75 kg    Examination:  General exam: Appears calm and comfortable  Respiratory system: Leak cleared with few scattered wheeze. Respiratory effort normal. Cardiovascular  system: S1 & S2 heard, RRR.  Gastrointestinal system: Soft, nontender, nondistended, bowel sounds positive. Central nervous system: Alert and oriented. No focal neurological deficits. Extremities: No edema, no cyanosis, pulses intact and symmetrical.   DVT prophylaxis: Subcu heparin Code  Status: DNR Family Communication: Unable to reach granddaughter on phone. Disposition Plan:  Status is: Inpatient  Remains inpatient appropriate because:Inpatient level of care appropriate due to severity of illness  Dispo: The patient is from: Home              Anticipated d/c is to: Home              Patient currently is not medically stable to d/c.   Difficult to place patient No              Level of care: Progressive Cardiac  All the records are reviewed and case discussed with Care Management/Social Worker. Management plans discussed with the patient, nursing and they are in agreement.  Consultants:  Nephrology  Procedures:  Antimicrobials:  Unasyn.  Data Reviewed: I have personally reviewed following labs and imaging studies  CBC: Recent Labs  Lab 06/08/21 0218 06/09/21 0638  WBC 15.5* 18.3*  NEUTROABS 14.0*  --   HGB 13.1 12.9*  HCT 37.4* 38.3*  MCV 88.6 89.1  PLT 260 491   Basic Metabolic Panel: Recent Labs  Lab 06/08/21 0218 06/08/21 0853 06/09/21 0638  NA 138 134* 137  K 5.6* 5.6* 5.2*  CL 105 103 105  CO2 21* 16* 17*  GLUCOSE 108* 157* 136*  BUN 93* 95* 115*  CREATININE 4.83* 4.57* 4.07*  CALCIUM 8.4* 7.9* 7.9*   GFR: Estimated Creatinine Clearance: 11.5 mL/min (A) (by C-G formula based on SCr of 4.07 mg/dL (H)). Liver Function Tests: Recent Labs  Lab 06/08/21 0218  AST 32  ALT 18  ALKPHOS 51  BILITOT 1.0  PROT 6.2*  ALBUMIN 2.9*   No results for input(s): LIPASE, AMYLASE in the last 168 hours. No results for input(s): AMMONIA in the last 168 hours. Coagulation Profile: No results for input(s): INR, PROTIME in the last 168 hours. Cardiac Enzymes: No results for input(s): CKTOTAL, CKMB, CKMBINDEX, TROPONINI in the last 168 hours. BNP (last 3 results) No results for input(s): PROBNP in the last 8760 hours. HbA1C: No results for input(s): HGBA1C in the last 72 hours. CBG: No results for input(s): GLUCAP in the last 168 hours. Lipid  Profile: No results for input(s): CHOL, HDL, LDLCALC, TRIG, CHOLHDL, LDLDIRECT in the last 72 hours. Thyroid Function Tests: No results for input(s): TSH, T4TOTAL, FREET4, T3FREE, THYROIDAB in the last 72 hours. Anemia Panel: No results for input(s): VITAMINB12, FOLATE, FERRITIN, TIBC, IRON, RETICCTPCT in the last 72 hours. Sepsis Labs: Recent Labs  Lab 06/08/21 0358 06/08/21 0607  PROCALCITON 13.20  --   LATICACIDVEN 1.3 1.1    Recent Results (from the past 240 hour(s))  Resp Panel by RT-PCR (Flu A&B, Covid) Nasopharyngeal Swab     Status: None   Collection Time: 06/08/21  2:22 AM   Specimen: Nasopharyngeal Swab; Nasopharyngeal(NP) swabs in vial transport medium  Result Value Ref Range Status   SARS Coronavirus 2 by RT PCR NEGATIVE NEGATIVE Final    Comment: (NOTE) SARS-CoV-2 target nucleic acids are NOT DETECTED.  The SARS-CoV-2 RNA is generally detectable in upper respiratory specimens during the acute phase of infection. The lowest concentration of SARS-CoV-2 viral copies this assay can detect is 138 copies/mL. A negative result does not preclude SARS-Cov-2 infection and  should not be used as the sole basis for treatment or other patient management decisions. A negative result may occur with  improper specimen collection/handling, submission of specimen other than nasopharyngeal swab, presence of viral mutation(s) within the areas targeted by this assay, and inadequate number of viral copies(<138 copies/mL). A negative result must be combined with clinical observations, patient history, and epidemiological information. The expected result is Negative.  Fact Sheet for Patients:  EntrepreneurPulse.com.au  Fact Sheet for Healthcare Providers:  IncredibleEmployment.be  This test is no t yet approved or cleared by the [place] FDA and  has been authorized for detection and/or diagnosis of SARS-CoV-2 by FDA under an Emergency Use  Authorization (EUA). This EUA will remain  in effect (meaning this test can be used) for the duration of the COVID-19 declaration under Section 564(b)(1) of the Act, 21 U.S.C.section 360bbb-3(b)(1), unless the authorization is terminated  or revoked sooner.       Influenza A by PCR NEGATIVE NEGATIVE Final   Influenza B by PCR NEGATIVE NEGATIVE Final    Comment: (NOTE) The Xpert Xpress SARS-CoV-2/FLU/RSV plus assay is intended as an aid in the diagnosis of influenza from Nasopharyngeal swab specimens and should not be used as a sole basis for treatment. Nasal washings and aspirates are unacceptable for Xpert Xpress SARS-CoV-2/FLU/RSV testing.  Fact Sheet for Patients: EntrepreneurPulse.com.au  Fact Sheet for Healthcare Providers: IncredibleEmployment.be  This test is not yet approved or cleared by the [place] FDA and has been authorized for detection and/or diagnosis of SARS-CoV-2 by FDA under an Emergency Use Authorization (EUA). This EUA will remain in effect (meaning this test can be used) for the duration of the COVID-19 declaration under Section 564(b)(1) of the Act, 21 U.S.C. section 360bbb-3(b)(1), unless the authorization is terminated or revoked.  Performed at [facility], [address] [place], [place] [address]   Blood Culture (routine x 2)     Status: None (Preliminary result)   Collection Time: 06/08/21  3:50 AM   Specimen: BLOOD  Result Value Ref Range Status   Specimen Description BLOOD RIGHT [facility]  Final   Special Requests   Final    BOTTLES DRAWN AEROBIC AND ANAEROBIC Blood Culture adequate volume   Culture   Final    NO GROWTH 1 DAY Performed at [facility], [address], [place], [place] [address]    Report Status PENDING  Incomplete  Blood Culture (routine x 2)     Status: None (Preliminary result)   Collection Time: 06/08/21  3:58 AM   Specimen: BLOOD  Result Value Ref Range Status    Specimen Description BLOOD RIGHT HAND  Final   Special Requests   Final    BOTTLES DRAWN AEROBIC AND ANAEROBIC Blood Culture adequate volume   Culture   Final    NO GROWTH 1 DAY Performed at [facility], [address] [place], [place] [address]    Report Status PENDING  Incomplete     Radiology Studies: US RENAL  Result Date: 06/08/2021 CLINICAL DATA:  Acute kidney injury EXAM: RENAL / URINARY TRACT ULTRASOUND COMPLETE COMPARISON:  Renal stone protocol CT 11/04/2020 FINDINGS: Right Kidney: Renal measurements: 10.4 x 4.9 x 5.3 cm = volume: 142 mL. Mild diffuse increased echogenicity of the parenchyma. No mass or hydronephrosis visualized. 1.7 cm simple cyst present the upper pole. Left Kidney: Renal measurements: 11.1 x 5.9 x 5.2 cm = volume: 178 mL. Mild diffuse increased echogenicity of the parenchyma. No mass or hydronephrosis  visualized. 2.4 cm simple cyst present in the lower pole. Bladder: Evaluation limited due to collapse configuration. Other: Enlarged prostate partially visualized. IMPRESSION: Increased echogenicity of the renal cortex consistent with chronic medical renal disease. Electronically Signed   By: Miachel Roux M.D.   On: 06/08/2021 08:22   DG Chest Portable 1 View  Result Date: 06/08/2021 CLINICAL DATA:  Shortness of breath. EXAM: PORTABLE CHEST 1 VIEW COMPARISON:  Chest radiograph dated 11/04/2020. FINDINGS: Diffuse interstitial and airspace density involving the right lung, new since the prior radiograph most consistent with developing infiltrate. There is background of emphysema. Trace right pleural effusion may be present. No pneumothorax. Cardiac silhouette is within limits. Atherosclerotic calcification of the aorta. Left shoulder arthroplasty. No acute osseous pathology. IMPRESSION: 1. Right lung infiltrate most consistent with pneumonia. 2. Emphysema. Electronically Signed   By: Anner Crete M.D.   On: 06/08/2021 02:53    Scheduled Meds:  aspirin EC   81 mg Oral Daily   [START ON 06/10/2021] colchicine  0.3 mg Oral Daily   finasteride  5 mg Oral Daily   heparin  5,000 Units Subcutaneous Q8H   ipratropium-albuterol  3 mL Nebulization Q4H   methylPREDNISolone (SOLU-MEDROL) injection  40 mg Intravenous Q12H   mometasone-formoterol  2 puff Inhalation BID   nicotine  21 mg Transdermal Daily   pantoprazole  40 mg Oral Daily   simvastatin  10 mg Oral QHS   Continuous Infusions:  sodium chloride 35 mL/hr at 06/08/21 2331   ampicillin-sulbactam (UNASYN) IV     azithromycin Stopped (06/09/21 0622)     LOS: 1 day   Time spent: 45 minutes. More than 50% of the time was spent in counseling/coordination of care  Lorella Nimrod, MD [facility]  If 7PM-7AM, please contact night-coverage [email or web address]  06/09/2021, 2:26 PM   This record has been created using Systems analyst. Errors have been sought and corrected,but may not always be located. Such creation errors do not reflect on the standard of care.

## 2021-06-09 NOTE — Progress Notes (Signed)
[facility]  ROUNDING NOTE   Subjective:   George [name] is a 87 y.o. male who presents to ED via EMS for chest pain. Chest x-ray showed right lower lobe pneumonia. We were consulted to evaluate acute kidney injury.  Patient seen resting quietly  Alert and oriented to self Attempting to eat breakfast  Creatinine 4.07 (4.57) Urinal at bedside with 236m yellow urine  Objective:  Vital signs in last 24 hours:  Temp:  [97.8 F (36.6 C)] 97.8 F (36.6 C) (08/30 1400) Pulse Rate:  [88-109] 92 (08/30 1400) Resp:  [12-26] 18 (08/30 1400) BP: (89-139)/(45-108) 118/69 (08/30 1400) SpO2:  [83 %-100 %] 97 % (08/30 1400) Weight:  [79.8 kg] 79.8 kg (08/30 1400)  Weight change:  Filed Weights   06/08/21 0219 06/09/21 1400  Weight: 75 kg 79.8 kg    Intake/Output: No intake/output data recorded.   Intake/Output this shift:  Total I/O In: -  Out: 1 [Stool:1]  Physical Exam: General: NAD, resting in bed  Head: Normocephalic, atraumatic. Moist oral mucosal membranes  Eyes: Anicteric  Lungs:  Rhonchi and crackles bilaterally, normal effort, cough, O2 6L  Heart: Regular rate and rhythm  Abdomen:  Soft, nontender  Extremities:  no peripheral edema.  Neurologic: Nonfocal, moving all four extremities  Skin: No lesions       Basic Metabolic Panel: Recent Labs  Lab 06/08/21 0218 06/08/21 0853 06/09/21 0638  NA 138 134* 137  K 5.6* 5.6* 5.2*  CL 105 103 105  CO2 21* 16* 17*  GLUCOSE 108* 157* 136*  BUN 93* 95* 115*  CREATININE 4.83* 4.57* 4.07*  CALCIUM 8.4* 7.9* 7.9*    Liver Function Tests: Recent Labs  Lab 06/08/21 0218  AST 32  ALT 18  ALKPHOS 51  BILITOT 1.0  PROT 6.2*  ALBUMIN 2.9*   No results for input(s): LIPASE, AMYLASE in the last 168 hours. No results for input(s): AMMONIA in the last 168 hours.  CBC: Recent Labs  Lab 06/08/21 0218 06/09/21 0638  WBC 15.5* 18.3*  NEUTROABS 14.0*  --   HGB 13.1 12.9*  HCT 37.4* 38.3*  MCV 88.6  89.1  PLT 260 234    Cardiac Enzymes: No results for input(s): CKTOTAL, CKMB, CKMBINDEX, TROPONINI in the last 168 hours.  BNP: Invalid input(s): POCBNP  CBG: No results for input(s): GLUCAP in the last 168 hours.  Microbiology: Results for orders placed or performed during the hospital encounter of 06/08/21  Resp Panel by RT-PCR (Flu A&B, Covid) Nasopharyngeal Swab     Status: None   Collection Time: 06/08/21  2:22 AM   Specimen: Nasopharyngeal Swab; Nasopharyngeal(NP) swabs in vial transport medium  Result Value Ref Range Status   SARS Coronavirus 2 by RT PCR NEGATIVE NEGATIVE Final    Comment: (NOTE) SARS-CoV-2 target nucleic acids are NOT DETECTED.  The SARS-CoV-2 RNA is generally detectable in upper respiratory specimens during the acute phase of infection. The lowest concentration of SARS-CoV-2 viral copies this assay can detect is 138 copies/mL. A negative result does not preclude SARS-Cov-2 infection and should not be used as the sole basis for treatment or other patient management decisions. A negative result may occur with  improper specimen collection/handling, submission of specimen other than nasopharyngeal swab, presence of viral mutation(s) within the areas targeted by this assay, and inadequate number of viral copies(<138 copies/mL). A negative result must be combined with clinical observations, patient history, and epidemiological information. The expected result is Negative.  Fact Sheet for  Patients:  EntrepreneurPulse.com.au  Fact Sheet for Healthcare Providers:  IncredibleEmployment.be  This test is no t yet approved or cleared by the [place] FDA and  has been authorized for detection and/or diagnosis of SARS-CoV-2 by FDA under an Emergency Use Authorization (EUA). This EUA will remain  in effect (meaning this test can be used) for the duration of the COVID-19 declaration under Section 564(b)(1) of the Act,  21 U.S.C.section 360bbb-3(b)(1), unless the authorization is terminated  or revoked sooner.       Influenza A by PCR NEGATIVE NEGATIVE Final   Influenza B by PCR NEGATIVE NEGATIVE Final    Comment: (NOTE) The Xpert Xpress SARS-CoV-2/FLU/RSV plus assay is intended as an aid in the diagnosis of influenza from Nasopharyngeal swab specimens and should not be used as a sole basis for treatment. Nasal washings and aspirates are unacceptable for Xpert Xpress SARS-CoV-2/FLU/RSV testing.  Fact Sheet for Patients: EntrepreneurPulse.com.au  Fact Sheet for Healthcare Providers: IncredibleEmployment.be  This test is not yet approved or cleared by the [place] FDA and has been authorized for detection and/or diagnosis of SARS-CoV-2 by FDA under an Emergency Use Authorization (EUA). This EUA will remain in effect (meaning this test can be used) for the duration of the COVID-19 declaration under Section 564(b)(1) of the Act, 21 U.S.C. section 360bbb-3(b)(1), unless the authorization is terminated or revoked.  Performed at [facility], [address] [place], [place] [address]   Blood Culture (routine x 2)     Status: None (Preliminary result)   Collection Time: 06/08/21  3:50 AM   Specimen: BLOOD  Result Value Ref Range Status   Specimen Description BLOOD RIGHT [facility]  Final   Special Requests   Final    BOTTLES DRAWN AEROBIC AND ANAEROBIC Blood Culture adequate volume   Culture   Final    NO GROWTH 1 DAY Performed at [facility], [address], [place], [place] [address]    Report Status PENDING  Incomplete  Blood Culture (routine x 2)     Status: None (Preliminary result)   Collection Time: 06/08/21  3:58 AM   Specimen: BLOOD  Result Value Ref Range Status   Specimen Description BLOOD RIGHT HAND  Final   Special Requests   Final    BOTTLES DRAWN AEROBIC AND ANAEROBIC Blood Culture adequate volume   Culture   Final    NO  GROWTH 1 DAY Performed at [facility], [address] [place], [place] [address]    Report Status PENDING  Incomplete    Coagulation Studies: No results for input(s): LABPROT, INR in the last 72 hours.  Urinalysis: Recent Labs    06/08/21 0853  COLORURINE YELLOW*  LABSPEC 1.018  PHURINE 5.0  GLUCOSEU NEGATIVE  HGBUR NEGATIVE  BILIRUBINUR NEGATIVE  KETONESUR 5*  PROTEINUR NEGATIVE  NITRITE NEGATIVE  LEUKOCYTESUR NEGATIVE      Imaging: US RENAL  Result Date: 06/08/2021 CLINICAL DATA:  Acute kidney injury EXAM: RENAL / URINARY TRACT ULTRASOUND COMPLETE COMPARISON:  Renal stone protocol CT 11/04/2020 FINDINGS: Right Kidney: Renal measurements: 10.4 x 4.9 x 5.3 cm = volume: 142 mL. Mild diffuse increased echogenicity of the parenchyma. No mass or hydronephrosis visualized. 1.7 cm simple cyst present the upper pole. Left Kidney: Renal measurements: 11.1 x 5.9 x 5.2 cm = volume: 178 mL. Mild diffuse increased echogenicity of the parenchyma. No mass or hydronephrosis visualized. 2.4 cm simple cyst present in the lower pole. Bladder: Evaluation limited due to collapse configuration. Other:  Enlarged prostate partially visualized. IMPRESSION: Increased echogenicity of the renal cortex consistent with chronic medical renal disease. Electronically Signed   By: Miachel Roux M.D.   On: 06/08/2021 08:22   DG Chest Portable 1 View  Result Date: 06/08/2021 CLINICAL DATA:  Shortness of breath. EXAM: PORTABLE CHEST 1 VIEW COMPARISON:  Chest radiograph dated 11/04/2020. FINDINGS: Diffuse interstitial and airspace density involving the right lung, new since the prior radiograph most consistent with developing infiltrate. There is background of emphysema. Trace right pleural effusion may be present. No pneumothorax. Cardiac silhouette is within limits. Atherosclerotic calcification of the aorta. Left shoulder arthroplasty. No acute osseous pathology. IMPRESSION: 1. Right lung infiltrate most  consistent with pneumonia. 2. Emphysema. Electronically Signed   By: Anner Crete M.D.   On: 06/08/2021 02:53     Medications:    sodium chloride 35 mL/hr at 06/08/21 2331   ampicillin-sulbactam (UNASYN) IV     azithromycin Stopped (06/09/21 0622)    aspirin EC  81 mg Oral Daily   [START ON 06/10/2021] colchicine  0.3 mg Oral Daily   finasteride  5 mg Oral Daily   heparin  5,000 Units Subcutaneous Q8H   ipratropium-albuterol  3 mL Nebulization Q4H   methylPREDNISolone (SOLU-MEDROL) injection  40 mg Intravenous Q12H   mometasone-formoterol  2 puff Inhalation BID   nicotine  21 mg Transdermal Daily   pantoprazole  40 mg Oral Daily   simvastatin  10 mg Oral QHS   acetaminophen, albuterol, dextromethorphan-guaiFENesin, hydrALAZINE, hyoscyamine, LORazepam, ondansetron (ZOFRAN) IV, oxyCODONE-acetaminophen  Assessment/ Plan:  Mr. George [name] is a 87 y.o.  male ho presents to ED via EMS for chest pain. Chest x-ray showed right lower lobe pneumonia. We were consulted to evaluate acute kidney injury.  Acute Kidney Injury on chronic kidney disease stage 3b with baseline creatinine 1.51 and GFR of 44 on 02/12/21.  Acute kidney injury secondary to ATN from ongoing sepsis Renal US has echogenic kidneys, enlarged prostate and no obstruction. UA negative for protein and blood. Improving with IVF, weaned to 6L [place] Continue IVF   Lab Results  Component Value Date   CREATININE 4.07 (H) 06/09/2021   CREATININE 4.57 (H) 06/08/2021   CREATININE 4.83 (H) 06/08/2021    Intake/Output Summary (Last 24 hours) at 06/09/2021 1452 Last data filed at 06/09/2021 0800 Gross per 24 hour  Intake --  Output 1 ml  Net -1 ml   2. Hyperkalemia  Currently 5.2, decreased from yesterday Will monitor and consider need for [place] daily.  3.Acute on chronic resp failure due to sepsis Underlying severe emphysema RLL pneumonia seen on X-ray Antibiotics     LOS: 1   8/30/20222:52 PM

## 2021-06-09 NOTE — ED Notes (Signed)
Pt had bowel movement on bedpan at this time. This RN changed pt's pads on bed, cleaned pt up, and brought them a fresh blanket. With the help of Heather RN this RN moved pt up in bed.

## 2021-06-09 NOTE — Plan of Care (Signed)
  Problem: Education: Goal: Knowledge of General Education information will improve Description: Including pain rating scale, medication(s)/side effects and non-pharmacologic comfort measures 06/09/2021 1509 by Cristela Blue, RN Outcome: Progressing 06/09/2021 1509 by Cristela Blue, RN Outcome: Progressing   Problem: Health Behavior/Discharge Planning: Goal: Ability to manage health-related needs will improve 06/09/2021 1509 by Cristela Blue, RN Outcome: Progressing 06/09/2021 1509 by Cristela Blue, RN Outcome: Progressing   Problem: Clinical Measurements: Goal: Ability to maintain clinical measurements within normal limits will improve 06/09/2021 1509 by Cristela Blue, RN Outcome: Progressing 06/09/2021 1509 by Cristela Blue, RN Outcome: Progressing Goal: Will remain free from infection 06/09/2021 1509 by Cristela Blue, RN Outcome: Progressing 06/09/2021 1509 by Cristela Blue, RN Outcome: Progressing Goal: Diagnostic test results will improve 06/09/2021 1509 by Cristela Blue, RN Outcome: Progressing 06/09/2021 1509 by Cristela Blue, RN Outcome: Progressing Goal: Respiratory complications will improve 06/09/2021 1509 by Cristela Blue, RN Outcome: Progressing 06/09/2021 1509 by Cristela Blue, RN Outcome: Progressing Goal: Cardiovascular complication will be avoided 06/09/2021 1509 by Cristela Blue, RN Outcome: Progressing 06/09/2021 1509 by Cristela Blue, RN Outcome: Progressing   Problem: Activity: Goal: Risk for activity intolerance will decrease 06/09/2021 1509 by Cristela Blue, RN Outcome: Progressing 06/09/2021 1509 by Cristela Blue, RN Outcome: Progressing   Problem: Nutrition: Goal: Adequate nutrition will be maintained 06/09/2021 1509 by Cristela Blue, RN Outcome: Progressing 06/09/2021 1509 by Cristela Blue, RN Outcome: Progressing   Problem: Coping: Goal: Level of anxiety will decrease 06/09/2021 1509 by Cristela Blue, RN Outcome:  Progressing 06/09/2021 1509 by Cristela Blue, RN Outcome: Progressing   Problem: Elimination: Goal: Will not experience complications related to bowel motility 06/09/2021 1509 by Cristela Blue, RN Outcome: Progressing 06/09/2021 1509 by Cristela Blue, RN Outcome: Progressing Goal: Will not experience complications related to urinary retention 06/09/2021 1509 by Cristela Blue, RN Outcome: Progressing 06/09/2021 1509 by Cristela Blue, RN Outcome: Progressing   Problem: Pain Managment: Goal: General experience of comfort will improve 06/09/2021 1509 by Cristela Blue, RN Outcome: Progressing 06/09/2021 1509 by Cristela Blue, RN Outcome: Progressing   Problem: Safety: Goal: Ability to remain free from injury will improve 06/09/2021 1509 by Cristela Blue, RN Outcome: Progressing 06/09/2021 1509 by Cristela Blue, RN Outcome: Progressing   Problem: Skin Integrity: Goal: Risk for impaired skin integrity will decrease 06/09/2021 1509 by Cristela Blue, RN Outcome: Progressing 06/09/2021 1509 by Cristela Blue, RN Outcome: Progressing

## 2021-06-10 LAB — RENAL FUNCTION PANEL
Albumin: 2.8 g/dL — ABNORMAL LOW (ref 3.5–5.0)
Anion gap: 8 (ref 5–15)
BUN: 31 mg/dL — ABNORMAL HIGH (ref 8–23)
CO2: 33 mmol/L — ABNORMAL HIGH (ref 22–32)
Calcium: 8.6 mg/dL — ABNORMAL LOW (ref 8.9–10.3)
Chloride: 98 mmol/L (ref 98–111)
Creatinine, Ser: 1.9 mg/dL — ABNORMAL HIGH (ref 0.61–1.24)
GFR, Estimated: 34 mL/min — ABNORMAL LOW (ref >60)
Glucose, Bld: 151 mg/dL — ABNORMAL HIGH (ref 70–99)
Phosphorus: 3.9 mg/dL (ref 2.5–4.6)
Potassium: 3.8 mmol/L (ref 3.5–5.1)
Sodium: 139 mmol/L (ref 135–145)

## 2021-06-10 LAB — CBC
HCT: 37.3 % — ABNORMAL LOW (ref 39.0–52.0)
Hemoglobin: 11.4 g/dL — ABNORMAL LOW (ref 13.0–17.0)
MCH: 28.5 pg (ref 26.0–34.0)
MCHC: 30.6 g/dL (ref 30.0–36.0)
MCV: 93.3 fL (ref 80.0–100.0)
Platelets: 201 10*3/uL (ref 150–400)
RBC: 4 MIL/uL — ABNORMAL LOW (ref 4.22–5.81)
RDW: 15.1 % (ref 11.5–15.5)
WBC: 7 10*3/uL (ref 4.0–10.5)
nRBC: 0 % (ref 0.0–0.2)

## 2021-06-10 MED ORDER — ALFUZOSIN HCL ER 10 MG PO TB24
10.0000 mg | ORAL_TABLET | Freq: Every day | ORAL | Status: DC
  Administered 2021-06-10 – 2021-06-16 (×7): 10 mg via ORAL
  Filled 2021-06-10 (×9): qty 1

## 2021-06-10 MED ORDER — MORPHINE SULFATE (PF) 2 MG/ML IV SOLN
1.0000 mg | Freq: Once | INTRAVENOUS | Status: AC
  Administered 2021-06-10: 1 mg via INTRAVENOUS
  Filled 2021-06-10: qty 1

## 2021-06-10 MED ORDER — CEFTRIAXONE SODIUM 2 G IJ SOLR
2.0000 g | INTRAMUSCULAR | Status: AC
Start: 2021-06-10 — End: 2021-06-12
  Administered 2021-06-10 – 2021-06-12 (×3): 2 g via INTRAVENOUS
  Filled 2021-06-10 (×2): qty 2
  Filled 2021-06-10 (×2): qty 20

## 2021-06-10 MED ORDER — VERAPAMIL HCL ER 120 MG PO TBCR
120.0000 mg | EXTENDED_RELEASE_TABLET | Freq: Every day | ORAL | Status: DC
  Administered 2021-06-10 – 2021-06-11 (×2): 120 mg via ORAL
  Filled 2021-06-10 (×2): qty 1

## 2021-06-10 MED ORDER — MORPHINE SULFATE (PF) 2 MG/ML IV SOLN: 1.0000 mg | Freq: Once | INTRAVENOUS | Status: DC

## 2021-06-10 MED ORDER — IPRATROPIUM-ALBUTEROL 0.5-2.5 (3) MG/3ML IN SOLN: 3.0000 mL | RESPIRATORY_TRACT | Status: DC

## 2021-06-10 MED ORDER — IPRATROPIUM-ALBUTEROL 0.5-2.5 (3) MG/3ML IN SOLN
3.0000 mL | RESPIRATORY_TRACT | Status: DC
  Administered 2021-06-11 – 2021-06-13 (×9): 3 mL via RESPIRATORY_TRACT
  Filled 2021-06-10 (×9): qty 3

## 2021-06-10 MED ORDER — PREDNISONE 20 MG PO TABS
40.0000 mg | ORAL_TABLET | Freq: Every day | ORAL | Status: DC
  Administered 2021-06-11 – 2021-06-14 (×4): 40 mg via ORAL
  Filled 2021-06-10 (×4): qty 2

## 2021-06-10 NOTE — NC FL2 (Signed)
[place] LEVEL OF CARE SCREENING TOOL     IDENTIFICATION  Patient Name: George [name] Birthdate: 02/01/1932 Sex: male Admission Date (Current Location): 06/08/2021  [facility] and [place] Number:  Engineering geologist and Address:  [facility], [address], [place] , [place] [address]      Provider Number: [id number]  Attending Physician Name and Address:  Enzo Bi, MD  Relative Name and Phone Number:  Margreta Journey (granddaughter) [phone]    Current Level of Care: Hospital Recommended Level of Care: [place] Prior Approval Number:    Date Approved/Denied:   PASRR Number: [id number]  Discharge Plan: SNF    Current Diagnoses: Patient Active Problem List   Diagnosis Date Noted   Acute on chronic respiratory failure with hypoxia ([place]) 06/08/2021   Gout 06/08/2021   Sepsis ([place]) 06/08/2021   COPD exacerbation ([place]) 06/08/2021   CAP (community acquired pneumonia) 06/08/2021   Status post reverse arthroplasty of shoulder, left 02/10/2021   Acute kidney injury superimposed on CKD IIIb ([place]) 11/04/2020   BPH with obstruction/lower urinary tract symptoms 11/04/2020   Hyperkalemia 11/04/2020   Acute urinary retention 11/04/2020   Pneumonia 06/25/2019   Foot pain 07/11/2018   Urinary urgency 06/27/2017   Erectile dysfunction 06/27/2017   Stage 3 acute kidney injury ([place]) 06/27/2017   Hypogonadism in male 06/27/2017   Vomiting 02/09/2016   Slurred speech 02/09/2016   Nausea and vomiting 02/09/2016   Lower abdominal pain 02/09/2016   Tobacco abuse 02/09/2016   Essential hypertension 02/09/2016   GERD (gastroesophageal reflux disease) 02/09/2016   Hyperlipidemia 04/22/2014   Hypertension 04/22/2014   COPD (chronic obstructive pulmonary disease) ([place]) 04/22/2014   Type II diabetes mellitus with renal manifestations ([place]) 04/22/2014   CAD (coronary artery disease) 09/10/1998    Orientation RESPIRATION BLADDER  Height & Weight     Self, Time, Situation, Place  O2 (6L nasal cannula) Incontinent, External catheter Weight: 178 lb 9.6 oz (81 kg) Height:  '5\' 6"'$  (167.6 cm)  BEHAVIORAL SYMPTOMS/MOOD NEUROLOGICAL BOWEL NUTRITION STATUS      Continent Diet (see discharge summary)  AMBULATORY STATUS COMMUNICATION OF NEEDS Skin   Limited Assist Verbally Normal                       Personal Care Assistance Level of Assistance  Bathing, Dressing, Total care, Feeding Bathing Assistance: Limited assistance Feeding assistance: Independent Dressing Assistance: Limited assistance Total Care Assistance: Limited assistance   Functional Limitations Info  Sight, Hearing, Speech Sight Info: Adequate Hearing Info: Impaired Speech Info: Adequate    SPECIAL CARE FACTORS FREQUENCY  PT (By licensed PT), OT (By licensed OT)     PT Frequency: min 4x weekly OT Frequency: min 4x weekly            Contractures Contractures Info: Not present    Additional Factors Info  Code Status, Allergies Code Status Info: DNR Allergies Info: No Known Allergies           Current Medications (06/10/2021):  This is the current hospital active medication list Current Facility-Administered Medications  Medication Dose Route Frequency Provider Last Rate Last Admin   0.9 %  sodium chloride infusion   Intravenous Continuous Murlean Iba, MD 35 mL/hr at 06/08/21 2331 Rate Change at 06/08/21 2331   acetaminophen (TYLENOL) tablet 650 mg  650 mg Oral Q6H PRN Ivor Costa, MD   650 mg at 06/08/21 1845   albuterol (PROVENTIL) (2.5 MG/3ML) 0.083% nebulizer  solution 2.5 mg  2.5 mg Nebulization Q4H PRN Ivor Costa, MD   2.5 mg at 06/08/21 0801   alfuzosin (UROXATRAL) 24 hr tablet 10 mg  10 mg Oral Q breakfast Enzo Bi, MD   10 mg at 06/10/21 1302   aspirin EC tablet 81 mg  81 mg Oral Daily Ivor Costa, MD   81 mg at 06/10/21 1029   azithromycin (ZITHROMAX) 500 mg in sodium chloride 0.9 % 250 mL IVPB  500 mg Intravenous Q24H  Arta Silence, MD 250 mL/hr at 06/10/21 0420 500 mg at 06/10/21 0420   cefTRIAXone (ROCEPHIN) 2 g in sodium chloride 0.9 % 100 mL IVPB  2 g Intravenous Q24H Enzo Bi, MD       colchicine tablet 0.3 mg  0.3 mg Oral Daily Lorella Nimrod, MD   0.3 mg at 06/10/21 1029   dextromethorphan-guaiFENesin ([place] DM) 30-600 MG per 12 hr tablet 1 tablet  1 tablet Oral BID PRN Ivor Costa, MD       finasteride (PROSCAR) tablet 5 mg  5 mg Oral Daily Ivor Costa, MD   5 mg at 06/10/21 1029   heparin injection 5,000 Units  5,000 Units Subcutaneous Q8H Ivor Costa, MD   5,000 Units at 06/10/21 1302   hydrALAZINE (APRESOLINE) injection 5 mg  5 mg Intravenous Q2H PRN Ivor Costa, MD       hyoscyamine (LEVSIN) tablet 0.125 mg  0.125 mg Oral Q4H PRN Ivor Costa, MD       ipratropium-albuterol (DUONEB) 0.5-2.5 (3) MG/3ML nebulizer solution 3 mL  3 mL Nebulization Q4H Ivor Costa, MD   3 mL at 06/10/21 1135   LORazepam (ATIVAN) injection 0.5 mg  0.5 mg Intravenous Q6H PRN Ivor Costa, MD   0.5 mg at 06/08/21 1355   methylPREDNISolone sodium succinate (SOLU-MEDROL) 40 mg/mL injection 40 mg  40 mg Intravenous Q12H Ivor Costa, MD   40 mg at 06/10/21 0413   mometasone-formoterol (DULERA) 200-5 MCG/ACT inhaler 2 puff  2 puff Inhalation BID Ivor Costa, MD   2 puff at 06/10/21 0745   nicotine (NICODERM CQ - dosed in mg/24 hours) patch 21 mg  21 mg Transdermal Daily Ivor Costa, MD   21 mg at 06/10/21 1029   ondansetron (ZOFRAN) injection 4 mg  4 mg Intravenous Q8H PRN Ivor Costa, MD       oxyCODONE-acetaminophen (PERCOCET/ROXICET) 5-325 MG per tablet 1 tablet  1 tablet Oral Q4H PRN Ivor Costa, MD   1 tablet at 06/10/21 0445   pantoprazole (PROTONIX) EC tablet 40 mg  40 mg Oral Daily Ivor Costa, MD   40 mg at 06/10/21 1030   simvastatin (ZOCOR) tablet 10 mg  10 mg Oral QHS Ivor Costa, MD   10 mg at 06/09/21 2252   verapamil (CALAN-SR) CR tablet 120 mg  120 mg Oral Daily Sharion Settler, NP   120 mg at 06/10/21 0745      Discharge Medications: Please see discharge summary for a list of discharge medications.  Relevant Imaging Results:  Relevant Lab Results:   Additional Information SSN: [id number]  Alberteen Sam, LCSW

## 2021-06-10 NOTE — Progress Notes (Signed)
   06/10/21 0422  Assess: MEWS Score  Temp 98.1 F (36.7 C)  BP 140/72  Pulse Rate (!) 114  Resp 16  SpO2 97 %  O2 Device Nasal Cannula  O2 Flow Rate (L/min) 6 L/min  Assess: MEWS Score  MEWS Temp 0  MEWS Systolic 0  MEWS Pulse 2  MEWS RR 0  MEWS LOC 0  MEWS Score 2  MEWS Score Color Yellow  Assess: if the MEWS score is Yellow or Red  Were vital signs taken at a resting state? Yes  Focused Assessment No change from prior assessment  Does the patient meet 2 or more of the SIRS criteria? No  MEWS guidelines implemented *See Row Information* Yes  Take Vital Signs  Increase Vital Sign Frequency  Yellow: Q 2hr X 2 then Q 4hr X 2, if remains yellow, continue Q 4hrs  Escalate  MEWS: Escalate Yellow: discuss with charge nurse/RN and consider discussing with provider and RRT  Notify: Charge Nurse/RN  Name of Charge Nurse/RN Company secretary, RN  Date Charge Nurse/RN Notified 06/10/21  Time Charge Nurse/RN Notified 0445  Notify: Provider  Provider Name/Title Randol Kern, NP  Date Provider Notified 06/10/21  Time Provider Notified [phone]  Notification Type Page  Notification Reason Other (Comment) (Yellow MEWS/IV infiltrate)  Provider response No new orders  Date of Provider Response 06/10/21  Time of Provider Response 0518  Document  Patient Outcome Stabilized after interventions  Assess: SIRS CRITERIA  SIRS Temperature  0  SIRS Pulse 1  SIRS Respirations  0  SIRS WBC 0  SIRS Score Sum  1

## 2021-06-10 NOTE — Progress Notes (Signed)
[facility]  ROUNDING NOTE   Subjective:   George [name] is a 87 y.o. male who presents to ED via EMS for chest pain. Chest x-ray showed right lower lobe pneumonia. We were consulted to evaluate acute kidney injury.  Patient seen resting quietly in bed Alert and more oriented Poor appetite Denies nausea and vomiting Denies shortness of breath  Creatinine 1.90 (4.07) (4.57) Ext catheter with 429m yellow urine  Objective:  Vital signs in last 24 hours:  Temp:  [97.6 F (36.4 C)-98.1 F (36.7 C)] 97.6 F (36.4 C) (08/31 1200) Pulse Rate:  [92-117] 116 (08/31 1200) Resp:  [16-18] 18 (08/31 1200) BP: (118-154)/(59-84) 151/73 (08/31 1200) SpO2:  [92 %-98 %] 95 % (08/31 1200) Weight:  [79.8 kg-84.4 kg] 81 kg (08/31 0900)  Weight change:  Filed Weights   06/09/21 1400 06/10/21 0700 06/10/21 0900  Weight: 79.8 kg 84.4 kg 81 kg    Intake/Output: I/O last 3 completed shifts: In: 1838 [P.O.:240; I.V.:997.1; IV Piggyback:601] Out: 401 [Urine:400; Stool:1]   Intake/Output this shift:  No intake/output data recorded.  Physical Exam: General: NAD, resting in bed  Head: Normocephalic, atraumatic. Moist oral mucosal membranes  Eyes: Anicteric  Lungs:  Crackles bilaterally, normal effort, cough, O2 6L  Heart: Regular rate and rhythm  Abdomen:  Soft, nontender  Extremities:  no peripheral edema.  Neurologic: Nonfocal, moving all four extremities  Skin: No lesions       Basic Metabolic Panel: Recent Labs  Lab 06/08/21 0218 06/08/21 0853 06/09/21 0638 06/10/21 0536  NA 138 134* 137 139  K 5.6* 5.6* 5.2* 3.8  CL 105 103 105 98  CO2 21* 16* 17* 33*  GLUCOSE 108* 157* 136* 151*  BUN 93* 95* 115* 31*  CREATININE 4.83* 4.57* 4.07* 1.90*  CALCIUM 8.4* 7.9* 7.9* 8.6*  PHOS  --   --   --  3.9     Liver Function Tests: Recent Labs  Lab 06/08/21 0218 06/10/21 0536  AST 32  --   ALT 18  --   ALKPHOS 51  --   BILITOT 1.0  --   PROT 6.2*  --   ALBUMIN  2.9* 2.8*    No results for input(s): LIPASE, AMYLASE in the last 168 hours. No results for input(s): AMMONIA in the last 168 hours.  CBC: Recent Labs  Lab 06/08/21 0218 06/09/21 0638 06/10/21 0536  WBC 15.5* 18.3* 7.0  NEUTROABS 14.0*  --   --   HGB 13.1 12.9* 11.4*  HCT 37.4* 38.3* 37.3*  MCV 88.6 89.1 93.3  PLT 260 234 201     Cardiac Enzymes: No results for input(s): CKTOTAL, CKMB, CKMBINDEX, TROPONINI in the last 168 hours.  BNP: Invalid input(s): POCBNP  CBG: Recent Labs  Lab 06/09/21 1644  GLUCAP 119*    Microbiology: Results for orders placed or performed during the hospital encounter of 06/08/21  Resp Panel by RT-PCR (Flu A&B, Covid) Nasopharyngeal Swab     Status: None   Collection Time: 06/08/21  2:22 AM   Specimen: Nasopharyngeal Swab; Nasopharyngeal(NP) swabs in vial transport medium  Result Value Ref Range Status   SARS Coronavirus 2 by RT PCR NEGATIVE NEGATIVE Final    Comment: (NOTE) SARS-CoV-2 target nucleic acids are NOT DETECTED.  The SARS-CoV-2 RNA is generally detectable in upper respiratory specimens during the acute phase of infection. The lowest concentration of SARS-CoV-2 viral copies this assay can detect is 138 copies/mL. A negative result does not preclude SARS-Cov-2 infection and  should not be used as the sole basis for treatment or other patient management decisions. A negative result may occur with  improper specimen collection/handling, submission of specimen other than nasopharyngeal swab, presence of viral mutation(s) within the areas targeted by this assay, and inadequate number of viral copies(<138 copies/mL). A negative result must be combined with clinical observations, patient history, and epidemiological information. The expected result is Negative.  Fact Sheet for Patients:  EntrepreneurPulse.com.au  Fact Sheet for Healthcare Providers:  IncredibleEmployment.be  This test is no t  yet approved or cleared by the [place] FDA and  has been authorized for detection and/or diagnosis of SARS-CoV-2 by FDA under an Emergency Use Authorization (EUA). This EUA will remain  in effect (meaning this test can be used) for the duration of the COVID-19 declaration under Section 564(b)(1) of the Act, 21 U.S.C.section 360bbb-3(b)(1), unless the authorization is terminated  or revoked sooner.       Influenza A by PCR NEGATIVE NEGATIVE Final   Influenza B by PCR NEGATIVE NEGATIVE Final    Comment: (NOTE) The Xpert Xpress SARS-CoV-2/FLU/RSV plus assay is intended as an aid in the diagnosis of influenza from Nasopharyngeal swab specimens and should not be used as a sole basis for treatment. Nasal washings and aspirates are unacceptable for Xpert Xpress SARS-CoV-2/FLU/RSV testing.  Fact Sheet for Patients: EntrepreneurPulse.com.au  Fact Sheet for Healthcare Providers: IncredibleEmployment.be  This test is not yet approved or cleared by the [place] FDA and has been authorized for detection and/or diagnosis of SARS-CoV-2 by FDA under an Emergency Use Authorization (EUA). This EUA will remain in effect (meaning this test can be used) for the duration of the COVID-19 declaration under Section 564(b)(1) of the Act, 21 U.S.C. section 360bbb-3(b)(1), unless the authorization is terminated or revoked.  Performed at [facility], [address] [place], [place] [address]   Blood Culture (routine x 2)     Status: None (Preliminary result)   Collection Time: 06/08/21  3:50 AM   Specimen: BLOOD  Result Value Ref Range Status   Specimen Description BLOOD RIGHT [facility]  Final   Special Requests   Final    BOTTLES DRAWN AEROBIC AND ANAEROBIC Blood Culture adequate volume   Culture   Final    NO GROWTH 2 DAYS Performed at [facility], [address] [place], [place] [address]    Report Status PENDING  Incomplete  Blood  Culture (routine x 2)     Status: None (Preliminary result)   Collection Time: 06/08/21  3:58 AM   Specimen: BLOOD  Result Value Ref Range Status   Specimen Description BLOOD RIGHT HAND  Final   Special Requests   Final    BOTTLES DRAWN AEROBIC AND ANAEROBIC Blood Culture adequate volume   Culture   Final    NO GROWTH 2 DAYS Performed at [facility], [address] [place], [place] [address]    Report Status PENDING  Incomplete    Coagulation Studies: No results for input(s): LABPROT, INR in the last 72 hours.  Urinalysis: Recent Labs    06/08/21 0853  COLORURINE YELLOW*  LABSPEC 1.018  PHURINE 5.0  GLUCOSEU NEGATIVE  HGBUR NEGATIVE  BILIRUBINUR NEGATIVE  KETONESUR 5*  PROTEINUR NEGATIVE  NITRITE NEGATIVE  LEUKOCYTESUR NEGATIVE       Imaging: No results found.   Medications:    sodium chloride 35 mL/hr at 06/08/21 2331   azithromycin 500 mg (06/10/21 0420)   cefTRIAXone (ROCEPHIN)  IV  alfuzosin  10 mg Oral Q breakfast   aspirin EC  81 mg Oral Daily   colchicine  0.3 mg Oral Daily   finasteride  5 mg Oral Daily   heparin  5,000 Units Subcutaneous Q8H   ipratropium-albuterol  3 mL Nebulization Q4H   methylPREDNISolone (SOLU-MEDROL) injection  40 mg Intravenous Q12H   mometasone-formoterol  2 puff Inhalation BID   nicotine  21 mg Transdermal Daily   pantoprazole  40 mg Oral Daily   simvastatin  10 mg Oral QHS   verapamil  120 mg Oral Daily   acetaminophen, albuterol, dextromethorphan-guaiFENesin, hydrALAZINE, hyoscyamine, LORazepam, ondansetron (ZOFRAN) IV, oxyCODONE-acetaminophen  Assessment/ Plan:  Mr. George [name] is a 87 y.o.  male ho presents to ED via EMS for chest pain. Chest x-ray showed right lower lobe pneumonia. We were consulted to evaluate acute kidney injury.  Acute Kidney Injury on chronic kidney disease stage 3b with baseline creatinine 1.51 and GFR of 44 on 02/12/21.  Acute kidney injury secondary to ATN from ongoing  sepsis Renal US has echogenic kidneys, enlarged prostate and no obstruction. UA negative for protein and blood. Greatly improved Will need hospital follow up at our office when discharged.  Lab Results  Component Value Date   CREATININE 1.90 (H) 06/10/2021   CREATININE 4.07 (H) 06/09/2021   CREATININE 4.57 (H) 06/08/2021    Intake/Output Summary (Last 24 hours) at 06/10/2021 1345 Last data filed at 06/10/2021 0500 Gross per 24 hour  Intake 1838 ml  Output 400 ml  Net 1438 ml    2. Hyperkalemia  Currently 3.8  3.Acute on chronic resp failure due to sepsis Underlying severe emphysema RLL pneumonia seen on X-ray Antibiotics     LOS: 2   8/31/20221:45 PM

## 2021-06-10 NOTE — Progress Notes (Signed)
Patient vital signs at 0422 generated yellow MEWS d/t elevated HR.  Patient azithromycin infiltrated in R forearm PIV.  Infusion stopped and switched to L forearm PIV.  Pharmacy contacted, no antidote needed.  Cold compresses applied.  Patient c/o 10/10 pain at R infiltrate site.  Hospitalist contacted.  Patient given 1 mg IV morphine with no relief.  Range of motion performed repeatedly on patient.  Pain improved to 7/10.  Neuro checks on arm performed, no change.  Hospitalist notified of improvement in pain and infiltrate site (area softened and edema decreased).

## 2021-06-10 NOTE — TOC Initial Note (Signed)
Transition of Care [facility]) - Initial/Assessment Note    Patient Details  Name: George [name] MRN: [id number] Date of Birth: [birth year]  Transition of Care [facility]) CM/SW Contact:    Alberteen Sam, LCSW Phone Number: 06/10/2021, 1:44 PM  Clinical Narrative:                  CSW spoke with patient's grandaughter Margreta Journey as patient noted to be confused with trouble breathing per Margreta Journey. Margreta Journey reports she can be reached at [phone] or [phone]. Margreta Journey updated on SNF recommendation and insurance process, reports she will discuss this with patient and family prior to deciding if they wish to move forward with SNF rec. She will call CSW back with their decision.   Expected Discharge Plan: Skilled Nursing Facility Barriers to Discharge: Continued Medical Work up   Patient Goals and CMS Choice   CMS Medicare.gov Compare Post Acute Care list provided to:: Patient Represenative (must comment) (grandaughter Margreta Journey) Choice offered to / list presented to : Adult Children  Expected Discharge Plan and Services Expected Discharge Plan: [place] arrangements for the past 2 months: Single Family Home                                      Prior Living Arrangements/Services Living arrangements for the past 2 months: Single Family Home Lives with:: Self Patient language and need for interpreter reviewed:: Yes        Need for Family Participation in Patient Care: Yes (Comment) Care giver support system in place?: Yes (comment)   Criminal Activity/Legal Involvement Pertinent to Current Situation/Hospitalization: No - Comment as needed  Activities of Daily Living Home Assistive Devices/Equipment: Cane (specify quad or straight), Walker (specify type) ADL Screening (condition at time of admission) Is the patient deaf or have difficulty hearing?: Yes Does the patient have difficulty seeing, even when wearing glasses/contacts?: Yes Does the  patient have difficulty concentrating, remembering, or making decisions?: No Does the patient have difficulty dressing or bathing?: Yes Does the patient have difficulty walking or climbing stairs?: Yes  Permission Sought/Granted   Permission granted to share information with : Yes, Verbal Permission Granted  Share Information with NAME: Margreta Journey  Permission granted to share info w AGENCY: SNFs  Permission granted to share info w Relationship: granddaughter  Permission granted to share info w Contact Information: [phone]  Emotional Assessment Appearance:: Appears stated age     Orientation: : Oriented to Self, Oriented to Place, Oriented to  Time, Oriented to Situation Alcohol / Substance Use: Not Applicable Psych Involvement: No (comment)  Admission diagnosis:  Acute respiratory failure ([place]) [J96.00] CAP (community acquired pneumonia) [J18.9] AKI (acute kidney injury) ([place]) [N17.9] Acute on chronic respiratory failure with hypoxia ([place]) [J96.21] Community acquired pneumonia of right lung, unspecified part of lung [J18.9] Patient Active Problem List   Diagnosis Date Noted   Acute on chronic respiratory failure with hypoxia ([place]) 06/08/2021   Gout 06/08/2021   Sepsis ([place]) 06/08/2021   COPD exacerbation ([place]) 06/08/2021   CAP (community acquired pneumonia) 06/08/2021   Status post reverse arthroplasty of shoulder, left 02/10/2021   Acute kidney injury superimposed on CKD IIIb ([place]) 11/04/2020   BPH with obstruction/lower urinary tract symptoms 11/04/2020   Hyperkalemia 11/04/2020   Acute urinary retention 11/04/2020   Pneumonia 06/25/2019   Foot pain 07/11/2018   Urinary urgency 06/27/2017  Erectile dysfunction 06/27/2017   Stage 3 acute kidney injury ([place]) 06/27/2017   Hypogonadism in male 06/27/2017   Vomiting 02/09/2016   Slurred speech 02/09/2016   Nausea and vomiting 02/09/2016   Lower abdominal pain 02/09/2016   Tobacco abuse 02/09/2016   Essential  hypertension 02/09/2016   GERD (gastroesophageal reflux disease) 02/09/2016   Hyperlipidemia 04/22/2014   Hypertension 04/22/2014   COPD (chronic obstructive pulmonary disease) ([place]) 04/22/2014   Type II diabetes mellitus with renal manifestations ([place]) 04/22/2014   CAD (coronary artery disease) 09/10/1998   PCP:  Juluis Pitch, MD Pharmacy:   [place], [place] [address] [address] Phone: [phone] Fax: [phone]     Social Determinants of Health (SDOH) Interventions    Readmission Risk Interventions No flowsheet data found.

## 2021-06-10 NOTE — Evaluation (Signed)
Clinical/Bedside Swallow Evaluation Patient Details  Name: George [name] MRN: [id number] Date of Birth: [birth year]  Today's Date: 06/10/2021 Time: SLP Start Time (ACUTE ONLY): 0825 SLP Stop Time (ACUTE ONLY): [id number] SLP Time Calculation (min) (ACUTE ONLY): 22 min  Past Medical History:  Past Medical History:  Diagnosis Date   Anemia    Arthritis    BPH (benign prostatic hyperplasia)    Cancer (HCC)    COLON   COPD (chronic obstructive pulmonary disease) (HCC)    Coronary artery disease    WITH 1 STENT   Diabetes mellitus without complication (HCC)    Dyspnea    WITH EXERTION ONLY   GERD (gastroesophageal reflux disease)    Hypercholesteremia    Hypertension    Pneumonia    Sleep apnea    HAD UPPP   Past Surgical History:  Past Surgical History:  Procedure Laterality Date   CARDIAC CATHETERIZATION     has a stent   COLON SURGERY     DUE TO COLON CANCER   COLONOSCOPY     MULTIPLE   EYE SURGERY Bilateral    CATARACT   HERNIA REPAIR     REVERSE SHOULDER ARTHROPLASTY Left 02/10/2021   Procedure: REVERSE SHOULDER ARTHROPLASTY;  Surgeon: Corky Mull, MD;  Location: [facility] ORS;  Service: Orthopedics;  Laterality: Left;   TONSILLECTOMY     UVULOPALATOPHARYNGOPLASTY     HPI:  Pt is an 87 y.o. male with medical history significant of CKD-IIIb, hypertension, hyperlipidemia, diabetes mellitus, COPD, GERD, gout, OSA, CAD, stent placement, colon cancer, BPH, anemia, tobacco abuse, and per patient L TSA April 2022 who presented with shortness of breath. MD assessment includes: Sepsis secondary to community-acquired pneumonia and COPD exacerbation/acute hypoxic respiratory failure, AKI, and hyperkalemia. Chest x-ray positive for RLL PNA.   Assessment / Plan / Recommendation Clinical Impression  Pt was asleep but easily woken and able to provide information about his chewing and swallowing. Pt reported no difficulty with either as well as no reported coughing when consuming POs. Per pt,  his "biggest problem is not having an appetite." He further described "no desire to eat anything for the last week." During this evaluation, pt politely decline food intake but did consume several sips of water via straw. No overt s/s of aspiration were observed. Recommend pt continue with currently ordered diet and follow strict aspiration precautions. Information shared with pt's nurse. SLP Visit Diagnosis: Dysphagia, unspecified (R13.10)    Aspiration Risk  Mild aspiration risk    Diet Recommendation Regular;Thin liquid   Liquid Administration via: Cup;Straw Medication Administration: Whole meds with liquid Supervision: Patient able to self feed Compensations: Minimize environmental distractions;Slow rate;Small sips/bites Postural Changes: Seated upright at 90 degrees;Remain upright for at least 30 minutes after po intake    Other  Recommendations Oral Care Recommendations: Oral care BID   Follow up Recommendations None      Frequency and Duration            Prognosis        Swallow Study   General Date of Onset: 06/08/21 HPI: Pt is an 87 y.o. male with medical history significant of CKD-IIIb, hypertension, hyperlipidemia, diabetes mellitus, COPD, GERD, gout, OSA, CAD, stent placement, colon cancer, BPH, anemia, tobacco abuse, and per patient L TSA April 2022 who presented with shortness of breath. MD assessment includes: Sepsis secondary to community-acquired pneumonia and COPD exacerbation/acute hypoxic respiratory failure, AKI, and hyperkalemia. Chest x-ray positive for RLL PNA. Type of Study: Bedside Swallow Evaluation  Previous Swallow Assessment: none in chart Diet Prior to this Study: Regular;Thin liquids Temperature Spikes Noted: No Respiratory Status: Nasal cannula History of Recent Intubation: No Behavior/Cognition: Alert;Cooperative;Pleasant mood Oral Cavity Assessment: Within Functional Limits Oral Care Completed by SLP: No Oral Cavity - Dentition: Adequate  natural dentition Vision: Functional for self-feeding Self-Feeding Abilities: Able to feed self Patient Positioning: Upright in bed Baseline Vocal Quality: Normal Volitional Cough: Strong Volitional Swallow: Able to elicit    Oral/Motor/Sensory Function Overall Oral Motor/Sensory Function: Within functional limits   Ice Chips Ice chips: Not tested   Thin Liquid Thin Liquid: Within functional limits Presentation: Self Fed;Straw;Cup    Nectar Thick Nectar Thick Liquid: Not tested   Honey Thick Honey Thick Liquid: Not tested   Puree Puree: Not tested (pt pleasantly declined)   Solid     Solid: Not tested (pt pleasantly declined)     George [name] B. George [name] M.S., CCC-SLP, [place] Pathologist [facility] Services Office [place] 06/10/2021,9:38 AM

## 2021-06-10 NOTE — Progress Notes (Signed)
PROGRESS NOTE    George [name]  XIP:[id number] DOB: Apr 12, 1932 DOA: 06/08/2021 PCP: Juluis Pitch, MD   Brief Narrative: Taken from H&P.   Subjective: Reported intermittent cough.    Assessment & Plan:   Principal Problem:   CAP (community acquired pneumonia) Active Problems:   Tobacco abuse   CAD (coronary artery disease)   Hyperlipidemia   Hypertension   Type II diabetes mellitus with renal manifestations (HCC)   Acute kidney injury superimposed on CKD IIIb (HCC)   BPH with obstruction/lower urinary tract symptoms   Hyperkalemia   Acute on chronic respiratory failure with hypoxia (HCC)   Gout   Sepsis ([place])   COPD exacerbation (HCC)  Sepsis secondary to community-acquired pneumonia and COPD exacerbation/acute hypoxic respiratory failure.  Patient met sepsis criteria with fever, leukocytosis, tachycardia and tachypnea.  Endorgan damage with AKI.  Hypotensive which improved with IV fluid.  Imaging concerning for right lung pneumonia.Strep pneumonia negative.  Blood cultures negative so far.  Procalcitonin elevated at 13.20.  He was started on ceftriaxone and Zithromax. -Based on the location of infiltrate-advanced age and mild underlying cognitive impairment he is high risk for aspiration.  Unable to answer any questions regarding coughing while eating.  Unable to reach the family. -Swallow evaluation found no significant aspiration Plan: --cont ceftriaxone and azithromycin --switch to prednisone tomorrow --DuoNeb q4h while awake  AKI with CKD stage IIIb.   Baseline creatinine appears to be around 1.5.  It was 4.83 on admission.  Nephrology was consulted.  Creatinine slowly improving with IV fluid. --cont NS'@35' , per nephrology -Continue holding lisinopril and ibuprofen  CAD (coronary artery disease): Currently no chest pain, troponin level 18, 14 --cont ASA and statin   Hyperlipidemia -Zocor   Hypertension.   Blood pressure within goal.  Antihypertensives  initially held due to softer blood pressure. --cont Verapamil --hold Lisinopril  Tobacco abuse --cont nicotine patch  Diet-controlled diabetes, Type II diabetes mellitus with renal manifestations [facility]):  Recent A1c 5.1, well controlled.  Blood sugar within goal. --no need for BG checks.  BPH with obstruction/lower urinary tract symptoms --resume alfuzosin --cont proscar   Hyperkalemia:  -s/p Leukoma 10 g --monitor   Gout -Colchicine-dose adjusted according to the renal function.   Objective: Vitals:   06/10/21 1100 06/10/21 1135 06/10/21 1200 06/10/21 1500  BP: (!) 154/84  (!) 151/73 (!) 157/80  Pulse: (!) 117  (!) 116 (!) 105  Resp: '18  18 18  ' Temp: 97.9 F (36.6 C)  97.6 F (36.4 C) 98.4 F (36.9 C)  TempSrc: Oral  Oral Axillary  SpO2: 97% 98% 95% 100%  Weight:      Height:        Intake/Output Summary (Last 24 hours) at 06/10/2021 1840 Last data filed at 06/10/2021 0500 Gross per 24 hour  Intake 340.95 ml  Output 400 ml  Net -59.05 ml   Filed Weights   06/09/21 1400 06/10/21 0700 06/10/21 0900  Weight: 79.8 kg 84.4 kg 81 kg    Examination:  Constitutional: NAD, AAOx3 HEENT: conjunctivae and lids normal, EOMI, very hard of hearing CV: No cyanosis.   RESP: normal respiratory effort, rhonchi, on 6L Extremities: No effusions, edema in BLE SKIN: warm, dry Neuro: II - XII grossly intact.   Psych: Normal mood and affect.     DVT prophylaxis: Subcu heparin Code Status: DNR Family Communication:   Status is: Inpatient  Dispo:   The patient is from: home Anticipated d/c is to: SNF Anticipated d/c date is:  2-3 days Patient currently is not medically stable to d/c due to: still on 6L O2   All the records are reviewed and case discussed with Care Management/Social Worker. Management plans discussed with the patient, nursing and they are in agreement.  Consultants:  Nephrology  Procedures:  Antimicrobials:  Unasyn.  Data Reviewed: I have  personally reviewed following labs and imaging studies  CBC: Recent Labs  Lab 06/08/21 0218 06/09/21 0638 06/10/21 0536  WBC 15.5* 18.3* 7.0  NEUTROABS 14.0*  --   --   HGB 13.1 12.9* 11.4*  HCT 37.4* 38.3* 37.3*  MCV 88.6 89.1 93.3  PLT 260 234 062   Basic Metabolic Panel: Recent Labs  Lab 06/08/21 0218 06/08/21 0853 06/09/21 0638 06/10/21 0536  NA 138 134* 137 139  K 5.6* 5.6* 5.2* 3.8  CL 105 103 105 98  CO2 21* 16* 17* 33*  GLUCOSE 108* 157* 136* 151*  BUN 93* 95* 115* 31*  CREATININE 4.83* 4.57* 4.07* 1.90*  CALCIUM 8.4* 7.9* 7.9* 8.6*  PHOS  --   --   --  3.9   GFR: Estimated Creatinine Clearance: 27.4 mL/min (A) (by C-G formula based on SCr of 1.9 mg/dL (H)). Liver Function Tests: Recent Labs  Lab 06/08/21 0218 06/10/21 0536  AST 32  --   ALT 18  --   ALKPHOS 51  --   BILITOT 1.0  --   PROT 6.2*  --   ALBUMIN 2.9* 2.8*   No results for input(s): LIPASE, AMYLASE in the last 168 hours. No results for input(s): AMMONIA in the last 168 hours. Coagulation Profile: No results for input(s): INR, PROTIME in the last 168 hours. Cardiac Enzymes: No results for input(s): CKTOTAL, CKMB, CKMBINDEX, TROPONINI in the last 168 hours. BNP (last 3 results) No results for input(s): PROBNP in the last 8760 hours. HbA1C: No results for input(s): HGBA1C in the last 72 hours. CBG: Recent Labs  Lab 06/09/21 1644  GLUCAP 119*   Lipid Profile: No results for input(s): CHOL, HDL, LDLCALC, TRIG, CHOLHDL, LDLDIRECT in the last 72 hours. Thyroid Function Tests: No results for input(s): TSH, T4TOTAL, FREET4, T3FREE, THYROIDAB in the last 72 hours. Anemia Panel: No results for input(s): VITAMINB12, FOLATE, FERRITIN, TIBC, IRON, RETICCTPCT in the last 72 hours. Sepsis Labs: Recent Labs  Lab 06/08/21 0358 06/08/21 0607  PROCALCITON 13.20  --   LATICACIDVEN 1.3 1.1    Recent Results (from the past 240 hour(s))  Resp Panel by RT-PCR (Flu A&B, Covid) Nasopharyngeal  Swab     Status: None   Collection Time: 06/08/21  2:22 AM   Specimen: Nasopharyngeal Swab; Nasopharyngeal(NP) swabs in vial transport medium  Result Value Ref Range Status   SARS Coronavirus 2 by RT PCR NEGATIVE NEGATIVE Final    Comment: (NOTE) SARS-CoV-2 target nucleic acids are NOT DETECTED.  The SARS-CoV-2 RNA is generally detectable in upper respiratory specimens during the acute phase of infection. The lowest concentration of SARS-CoV-2 viral copies this assay can detect is 138 copies/mL. A negative result does not preclude SARS-Cov-2 infection and should not be used as the sole basis for treatment or other patient management decisions. A negative result may occur with  improper specimen collection/handling, submission of specimen other than nasopharyngeal swab, presence of viral mutation(s) within the areas targeted by this assay, and inadequate number of viral copies(<138 copies/mL). A negative result must be combined with clinical observations, patient history, and epidemiological information. The expected result is Negative.  Fact Sheet for Patients:  EntrepreneurPulse.com.au  Fact Sheet for Healthcare Providers:  IncredibleEmployment.be  This test is no t yet approved or cleared by the [place] FDA and  has been authorized for detection and/or diagnosis of SARS-CoV-2 by FDA under an Emergency Use Authorization (EUA). This EUA will remain  in effect (meaning this test can be used) for the duration of the COVID-19 declaration under Section 564(b)(1) of the Act, 21 U.S.C.section 360bbb-3(b)(1), unless the authorization is terminated  or revoked sooner.       Influenza A by PCR NEGATIVE NEGATIVE Final   Influenza B by PCR NEGATIVE NEGATIVE Final    Comment: (NOTE) The Xpert Xpress SARS-CoV-2/FLU/RSV plus assay is intended as an aid in the diagnosis of influenza from Nasopharyngeal swab specimens and should not be used as a sole  basis for treatment. Nasal washings and aspirates are unacceptable for Xpert Xpress SARS-CoV-2/FLU/RSV testing.  Fact Sheet for Patients: EntrepreneurPulse.com.au  Fact Sheet for Healthcare Providers: IncredibleEmployment.be  This test is not yet approved or cleared by the [place] FDA and has been authorized for detection and/or diagnosis of SARS-CoV-2 by FDA under an Emergency Use Authorization (EUA). This EUA will remain in effect (meaning this test can be used) for the duration of the COVID-19 declaration under Section 564(b)(1) of the Act, 21 U.S.C. section 360bbb-3(b)(1), unless the authorization is terminated or revoked.  Performed at [facility], [address] [place], [place] [address]   Blood Culture (routine x 2)     Status: None (Preliminary result)   Collection Time: 06/08/21  3:50 AM   Specimen: BLOOD  Result Value Ref Range Status   Specimen Description BLOOD RIGHT [facility]  Final   Special Requests   Final    BOTTLES DRAWN AEROBIC AND ANAEROBIC Blood Culture adequate volume   Culture   Final    NO GROWTH 2 DAYS Performed at [facility], [address], [place], [place] [address]    Report Status PENDING  Incomplete  Blood Culture (routine x 2)     Status: None (Preliminary result)   Collection Time: 06/08/21  3:58 AM   Specimen: BLOOD  Result Value Ref Range Status   Specimen Description BLOOD RIGHT HAND  Final   Special Requests   Final    BOTTLES DRAWN AEROBIC AND ANAEROBIC Blood Culture adequate volume   Culture   Final    NO GROWTH 2 DAYS Performed at [facility], [address] [place],  [address]    Report Status PENDING  Incomplete     Radiology Studies: No results found.  Scheduled Meds:  alfuzosin  10 mg Oral Q breakfast   aspirin EC  81 mg Oral Daily   colchicine  0.3 mg Oral Daily   finasteride  5 mg Oral Daily   heparin  5,000 Units Subcutaneous Q8H    ipratropium-albuterol  3 mL Nebulization Q4H   methylPREDNISolone (SOLU-MEDROL) injection  40 mg Intravenous Q12H   mometasone-formoterol  2 puff Inhalation BID   nicotine  21 mg Transdermal Daily   pantoprazole  40 mg Oral Daily   simvastatin  10 mg Oral QHS   verapamil  120 mg Oral Daily   Continuous Infusions:  sodium chloride 35 mL/hr at 06/08/21 2331   azithromycin 500 mg (06/10/21 0420)   cefTRIAXone (ROCEPHIN)  IV 2 g (06/10/21 1552)     LOS: 2 days    Enzo Bi, MD [facility]  If 7PM-7AM, please contact night-coverage [email or web address]  06/10/2021, 6:40 PM

## 2021-06-10 NOTE — Progress Notes (Signed)
Cross Cover [place] med verapamil restarted at partial home dose 120 daily

## 2021-06-10 NOTE — Progress Notes (Signed)
PT Cancellation Note  Patient Details Name: George [name] MRN: [id number] DOB: October 25, 1931   Cancelled Treatment:    Reason Eval/Treat Not Completed: Medical issues which prohibited therapy: Patient's nurse requested PT hold this date secondary to elevated resting HR. Will attempt to see pt at a future date/time as medically appropriate.     Linus Salmons PT, DPT 06/10/21, 2:53 PM

## 2021-06-11 DIAGNOSIS — J189 Pneumonia, unspecified organism: Secondary | ICD-10-CM

## 2021-06-11 HISTORY — DX: Pneumonia, unspecified organism: J18.9

## 2021-06-11 LAB — CBC
HCT: 35.7 % — ABNORMAL LOW (ref 39.0–52.0)
Hemoglobin: 12.3 g/dL — ABNORMAL LOW (ref 13.0–17.0)
MCH: 29.7 pg (ref 26.0–34.0)
MCHC: 34.5 g/dL (ref 30.0–36.0)
MCV: 86.2 fL (ref 80.0–100.0)
Platelets: 252 10*3/uL (ref 150–400)
RBC: 4.14 MIL/uL — ABNORMAL LOW (ref 4.22–5.81)
RDW: 15.5 % (ref 11.5–15.5)
WBC: 14.8 10*3/uL — ABNORMAL HIGH (ref 4.0–10.5)
nRBC: 0 % (ref 0.0–0.2)

## 2021-06-11 LAB — BASIC METABOLIC PANEL
Anion gap: 11 (ref 5–15)
BUN: 104 mg/dL — ABNORMAL HIGH (ref 8–23)
CO2: 18 mmol/L — ABNORMAL LOW (ref 22–32)
Calcium: 8.2 mg/dL — ABNORMAL LOW (ref 8.9–10.3)
Chloride: 108 mmol/L (ref 98–111)
Creatinine, Ser: 2.61 mg/dL — ABNORMAL HIGH (ref 0.61–1.24)
GFR, Estimated: 23 mL/min — ABNORMAL LOW (ref >60)
Glucose, Bld: 187 mg/dL — ABNORMAL HIGH (ref 70–99)
Potassium: 4.9 mmol/L (ref 3.5–5.1)
Sodium: 137 mmol/L (ref 135–145)

## 2021-06-11 LAB — MAGNESIUM: Magnesium: 2.1 mg/dL (ref 1.7–2.4)

## 2021-06-11 MED ORDER — VERAPAMIL HCL ER 240 MG PO TBCR
240.0000 mg | EXTENDED_RELEASE_TABLET | Freq: Two times a day (BID) | ORAL | Status: DC
  Administered 2021-06-12 – 2021-06-16 (×10): 240 mg via ORAL
  Filled 2021-06-11 (×13): qty 1

## 2021-06-11 MED ORDER — AZITHROMYCIN 500 MG PO TABS
500.0000 mg | ORAL_TABLET | Freq: Every day | ORAL | Status: AC
  Administered 2021-06-12: 500 mg via ORAL
  Filled 2021-06-11: qty 1

## 2021-06-11 MED ORDER — VERAPAMIL HCL ER 120 MG PO TBCR
120.0000 mg | EXTENDED_RELEASE_TABLET | Freq: Once | ORAL | Status: AC
  Administered 2021-06-11: 120 mg via ORAL
  Filled 2021-06-11: qty 1

## 2021-06-11 NOTE — Progress Notes (Signed)
PHARMACIST - PHYSICIAN COMMUNICATION DR:  Billie Ruddy CONCERNING: Antibiotic IV to Oral Route Change Policy  RECOMMENDATION: This patient is receiving azithromycin by the intravenous route.  Based on criteria approved by the Pharmacy and Therapeutics Committee, the antibiotic(s) is/are being converted to the equivalent oral dose form(s).   DESCRIPTION: These criteria include: Patient being treated for a respiratory tract infection, urinary tract infection, cellulitis or clostridium difficile associated diarrhea if on metronidazole The patient is not neutropenic and does not exhibit a GI malabsorption state The patient is eating (either orally or via tube) and/or has been taking other orally administered medications for a least 24 hours The patient is improving clinically and has a Tmax < 100.5  If you have questions about this conversion, please contact the [place], PharmD, BCPS Clinical Pharmacist 06/11/2021 8:18 AM

## 2021-06-11 NOTE — Progress Notes (Signed)
Occupational Therapy Treatment Patient Details Name: George [name] MRN: [id number] DOB: 1932/08/29 Today's Date: 06/11/2021    History of present illness Pt is an 87 y.o. male with medical history significant of CKD-IIIb, hypertension, hyperlipidemia, diabetes mellitus, COPD, GERD, gout, OSA, CAD, stent placement, colon cancer, BPH, anemia, tobacco abuse, and per patient L TSA April 2022 who presented with shortness of breath. MD assessment includes: Sepsis secondary to community-acquired pneumonia and COPD exacerbation/acute hypoxic respiratory failure, AKI, and hyperkalemia.   OT comments  Pt seen for OT treatment on this date. Upon arrival to room, pt awake and seated upright in recliner on 4L of O2. Pt reporting "this is the best I've felt all day", denying any dizziness, and agreeable to OT tx. With MOD A+2, pt was able to perform sit>stand transfer, however fatiguing quickly and unable to stand for more than 10sec with RW (HR 127, SpO2 >92%). While seated in recliner, pt able to perform seated UB/LB dressing and grooming tasks with SUPERVISION/SET-UP, however again fatiguing quickly and requiring frequent rest breaks during seated ADLs. At end of session, pt left in recliner, in no acute distress (HR returning to resting HR of 110s), and with all needs within reach. Of note, RN informed of elevated HR and new redness on pt's R forearm. Pt continues to benefit from skilled OT services to maximize return to PLOF and minimize risk of future falls, injury, caregiver burden, and readmission. Will continue to follow POC. Discharge recommendation remains appropriate.     Follow Up Recommendations  SNF    Equipment Recommendations  None recommended by OT (pt has all DME needs)       Precautions / Restrictions Precautions Precautions: Fall Restrictions Weight Bearing Restrictions: No       Mobility Bed Mobility               General bed mobility comments: not assessed, pt in recliner  at beginning and end of session    Transfers Overall transfer level: Needs assistance Equipment used: Rolling walker (2 wheeled) Transfers: Sit to/from Stand Sit to Stand: Mod assist;+2 physical assistance              Balance Overall balance assessment: Needs assistance Sitting-balance support: No upper extremity supported;Feet supported Sitting balance-Leahy Scale: Good Sitting balance - Comments: Good sitting balance reaching outside BOS to adjust socks   Standing balance support: Bilateral upper extremity supported;During functional activity Standing balance-Leahy Scale: Poor Standing balance comment: MOD A+2 to maintain standing balance for ~10sec                           ADL either performed or assessed with clinical judgement   ADL Overall ADL's : Needs assistance/impaired     Grooming: Applying deodorant;Supervision/safety;Set up;Sitting           Upper Body Dressing : Supervision/safety;Set up;Sitting Upper Body Dressing Details (indicate cue type and reason): to don/doff hospital gown Lower Body Dressing: Min guard;Sitting/lateral leans Lower Body Dressing Details (indicate cue type and reason): to bend over and adjust socks                      Cognition Arousal/Alertness: Awake/alert Behavior During Therapy: WFL for tasks assessed/performed Overall Cognitive Status: No family/caregiver present to determine baseline cognitive functioning  General Comments: Pt alert and oriented to self, place, and parts of situation              General Comments HR 110s seated at rest in recliner. HR 127 following standing for 10sec. HR 117 at end of session. RN informed. SpO2 >92% throughout on 4L of O2 via [place]    Pertinent Vitals/ Pain       Pain Assessment: No/denies pain         Frequency  Min 1X/week        Progress Toward Goals  OT Goals(current goals can now be found in the care  plan section)  Progress towards OT goals: Progressing toward goals  Acute Rehab OT Goals Patient Stated Goal: To be stronger OT Goal Formulation: With patient Time For Goal Achievement: 06/23/21 Potential to Achieve Goals: Good  Plan Discharge plan remains appropriate;Frequency remains appropriate       AM-PAC OT "6 Clicks" Daily Activity     Outcome Measure   Help from another person eating meals?: None Help from another person taking care of personal grooming?: A Little Help from another person toileting, which includes using toliet, bedpan, or urinal?: A Lot Help from another person bathing (including washing, rinsing, drying)?: A Lot Help from another person to put on and taking off regular upper body clothing?: A Little Help from another person to put on and taking off regular lower body clothing?: A Little 6 Click Score: 17    End of Session Equipment Utilized During Treatment: Gait belt;Rolling walker  OT Visit Diagnosis: Unsteadiness on feet (R26.81);Muscle weakness (generalized) (M62.81);History of falling (Z91.81)   Activity Tolerance Patient tolerated treatment well   Patient Left in chair;with call bell/phone within reach;with chair alarm set   Nurse Communication Mobility status;Other (comment) (redness on R forearm)        Time: [id number] OT Time Calculation (min): 28 min  Charges: OT General Charges $OT Visit: 1 Visit OT Treatments $Self Care/Home Management : 8-22 mins $Therapeutic Activity: 8-22 mins  Fredirick Maudlin, OTR/L [place]

## 2021-06-11 NOTE — TOC Progression Note (Addendum)
Transition of Care [facility]) - Progression Note    Patient Details  Name: ZIYANG ORIO MRN: [id number] Date of Birth: 1932/09/17  Transition of Care [facility]) CM/SW [place], [place] Phone Number: 06/11/2021, 2:09 PM  Clinical Narrative:     CSW followed up with patient's daughter Margreta Journey regarding discharge planning, she reports patient wishes to go home. Reports he has O2, walker, 3in1 and tub bench at home. Only DME needs will be a hospital bed, CSW to reach out to [facility] at discharge.   CSW offered choice for max HH services, reports they used [facility] in the past and are preference for [facility] PT, OT, RN, aide and social work.   CSW has reached out to [facility] rep Ramond Marrow and made referral to [facility], they have accepted.    Expected Discharge Plan: [place] Barriers to Discharge: Continued Medical Work up  Expected Discharge Plan and Services Expected Discharge Plan: [place] arrangements for the past 2 months: Single Family Home                   DME Agency: [facility]       HH Arranged: PT, OT, RN, Nurse's Aide, Social Work [employer] Agency: Ben Tayen Narang ([facility]) Date [place]: 06/11/21 Time [place]: [place] Representative spoke with at [place]: [place] ([place]) Interventions    Readmission Risk Interventions No flowsheet data found.

## 2021-06-11 NOTE — Progress Notes (Signed)
PROGRESS NOTE    George [name]  VEH:[id number] DOB: September 20, 1932 DOA: 06/08/2021 PCP: Juluis Pitch, MD   Brief Narrative: Taken from H&P.   Subjective: Pt was upset and agitated when seen today, because he wanted to get out of bed but couldn't.  Pt also upset about some past billing and insurance issues.   Assessment & Plan:   Principal Problem:   CAP (community acquired pneumonia) Active Problems:   Tobacco abuse   CAD (coronary artery disease)   Hyperlipidemia   Hypertension   Type II diabetes mellitus with renal manifestations (HCC)   Acute kidney injury superimposed on CKD IIIb (HCC)   BPH with obstruction/lower urinary tract symptoms   Hyperkalemia   Acute on chronic respiratory failure with hypoxia (HCC)   Gout   Sepsis ([place])   COPD exacerbation (HCC)  Sepsis secondary to community-acquired pneumonia and COPD exacerbation/acute hypoxic respiratory failure.  Patient met sepsis criteria with fever, leukocytosis, tachycardia and tachypnea.  Endorgan damage with AKI.  Hypotensive which improved with IV fluid.  Imaging concerning for right lung pneumonia.Strep pneumonia negative.  Blood cultures negative so far.  Procalcitonin elevated at 13.20.  He was started on ceftriaxone and Zithromax. -Based on the location of infiltrate-advanced age and mild underlying cognitive impairment he is high risk for aspiration.  Unable to answer any questions regarding coughing while eating.  Unable to reach the family. -Swallow evaluation found no significant aspiration Plan: --cont ceftriaxone and azithromycin --cont prednisone 40 mg daily --DuoNeb q4h while awake --Dulera  AKI with CKD stage IIIb.   Baseline creatinine appears to be around 1.5.  It was 4.83 on admission.  Nephrology was consulted.  Creatinine slowly improving with IV fluid.   --Cr acutely worsened this morning. Plan: --cont NS_0 , per nephrology -Continue holding lisinopril and ibuprofen  CAD (coronary artery  disease): Currently no chest pain, troponin level 18, 14 --cont ASA and statin   Hyperlipidemia -Zocor   Hypertension.   Blood pressure within goal.  Antihypertensives initially held due to softer blood pressure. --increase verapamil to home dose of 240 mg BID --hold Lisinopril  Tobacco abuse --cont nicotine patch  Diet-controlled diabetes, Type II diabetes mellitus with renal manifestations [facility]):  Recent A1c 5.1, well controlled.  Blood sugar within goal. --no need for BG checks.  BPH with obstruction/lower urinary tract symptoms --cont alfuzosin and proscar   Hyperkalemia:  -s/p Leukoma 10 g --monitor   Gout -cont Colchicine-dose adjusted according to the renal function.   Objective: Vitals:   06/10/21 2340 06/11/21 0348 06/11/21 0804 06/11/21 1247  BP: (!) 157/75 (!) 183/95 (!) 170/97 (!) 144/67  Pulse: (!) 111 (!) 114 (!) 111 (!) 121  Resp: _1 Temp: 97.7 F (36.5 C) 98.6 F (37 C) 97.8 F (36.6 C) (!) 97.5 F (36.4 C)  TempSrc:    Axillary  SpO2: 100% 99% 96%   Weight:  80.7 kg    Height:        Intake/Output Summary (Last 24 hours) at 06/11/2021 1628 Last data filed at 06/11/2021 1250 Gross per 24 hour  Intake 1115.89 ml  Output 1400 ml  Net -284.11 ml   Filed Weights   06/10/21 0700 06/10/21 0900 06/11/21 0348  Weight: 84.4 kg 81 kg 80.7 kg    Examination:  Constitutional: NAD, alert, oriented to person and place HEENT: conjunctivae and lids normal, EOMI, hard of hearing CV: No cyanosis.   RESP: normal respiratory effort, on 4L Extremities: No effusions, edema in  BLE SKIN: warm, dry Neuro: II - XII grossly intact.   Psych: agitated mood and affect.     DVT prophylaxis: Subcu heparin Code Status: DNR Family Communication: pt is estranged from his son.  Updated Christina at bedside today, who is his caregiver friend (not related).    Status is: Inpatient  Dispo:   The patient is from: home Anticipated d/c is to: home, pt  declined SNF Anticipated d/c date is: 2-3 days Patient currently is not medically stable to d/c due to: still on 4L O2   All the records are reviewed and case discussed with Care Management/Social Worker. Management plans discussed with the patient, nursing and they are in agreement.  Consultants:  Nephrology  Procedures:  Antimicrobials:  Unasyn.  Data Reviewed: I have personally reviewed following labs and imaging studies  CBC: Recent Labs  Lab 06/08/21 0218 06/09/21 0638 06/10/21 0536 06/11/21 0417  WBC 15.5* 18.3* 7.0 14.8*  NEUTROABS 14.0*  --   --   --   HGB 13.1 12.9* 11.4* 12.3*  HCT 37.4* 38.3* 37.3* 35.7*  MCV 88.6 89.1 93.3 86.2  PLT 260 234 201 973   Basic Metabolic Panel: Recent Labs  Lab 06/08/21 0218 06/08/21 0853 06/09/21 0638 06/10/21 0536 06/11/21 0417  NA 138 134* 137 139 137  K 5.6* 5.6* 5.2* 3.8 4.9  CL 105 103 105 98 108  CO2 21* 16* 17* 33* 18*  GLUCOSE 108* 157* 136* 151* 187*  BUN 93* 95* 115* 31* 104*  CREATININE 4.83* 4.57* 4.07* 1.90* 2.61*  CALCIUM 8.4* 7.9* 7.9* 8.6* 8.2*  MG  --   --   --   --  2.1  PHOS  --   --   --  3.9  --    GFR: Estimated Creatinine Clearance: 19.9 mL/min (A) (by C-G formula based on SCr of 2.61 mg/dL (H)). Liver Function Tests: Recent Labs  Lab 06/08/21 0218 06/10/21 0536  AST 32  --   ALT 18  --   ALKPHOS 51  --   BILITOT 1.0  --   PROT 6.2*  --   ALBUMIN 2.9* 2.8*   No results for input(s): LIPASE, AMYLASE in the last 168 hours. No results for input(s): AMMONIA in the last 168 hours. Coagulation Profile: No results for input(s): INR, PROTIME in the last 168 hours. Cardiac Enzymes: No results for input(s): CKTOTAL, CKMB, CKMBINDEX, TROPONINI in the last 168 hours. BNP (last 3 results) No results for input(s): PROBNP in the last 8760 hours. HbA1C: No results for input(s): HGBA1C in the last 72 hours. CBG: Recent Labs  Lab 06/09/21 1644  GLUCAP 119*   Lipid Profile: No results for  input(s): CHOL, HDL, LDLCALC, TRIG, CHOLHDL, LDLDIRECT in the last 72 hours. Thyroid Function Tests: No results for input(s): TSH, T4TOTAL, FREET4, T3FREE, THYROIDAB in the last 72 hours. Anemia Panel: No results for input(s): VITAMINB12, FOLATE, FERRITIN, TIBC, IRON, RETICCTPCT in the last 72 hours. Sepsis Labs: Recent Labs  Lab 06/08/21 0358 06/08/21 0607  PROCALCITON 13.20  --   LATICACIDVEN 1.3 1.1    Recent Results (from the past 240 hour(s))  Resp Panel by RT-PCR (Flu A&B, Covid) Nasopharyngeal Swab     Status: None   Collection Time: 06/08/21  2:22 AM   Specimen: Nasopharyngeal Swab; Nasopharyngeal(NP) swabs in vial transport medium  Result Value Ref Range Status   SARS Coronavirus 2 by RT PCR NEGATIVE NEGATIVE Final    Comment: (NOTE) SARS-CoV-2 target nucleic acids are NOT DETECTED.  The SARS-CoV-2 RNA is generally detectable in upper respiratory specimens during the acute phase of infection. The lowest concentration of SARS-CoV-2 viral copies this assay can detect is 138 copies/mL. A negative result does not preclude SARS-Cov-2 infection and should not be used as the sole basis for treatment or other patient management decisions. A negative result may occur with  improper specimen collection/handling, submission of specimen other than nasopharyngeal swab, presence of viral mutation(s) within the areas targeted by this assay, and inadequate number of viral copies(<138 copies/mL). A negative result must be combined with clinical observations, patient history, and epidemiological information. The expected result is Negative.  Fact Sheet for Patients:  EntrepreneurPulse.com.au  Fact Sheet for Healthcare Providers:  IncredibleEmployment.be  This test is no t yet approved or cleared by the [place] FDA and  has been authorized for detection and/or diagnosis of SARS-CoV-2 by FDA under an Emergency Use Authorization (EUA). This EUA  will remain  in effect (meaning this test can be used) for the duration of the COVID-19 declaration under Section 564(b)(1) of the Act, 21 U.S.C.section 360bbb-3(b)(1), unless the authorization is terminated  or revoked sooner.       Influenza A by PCR NEGATIVE NEGATIVE Final   Influenza B by PCR NEGATIVE NEGATIVE Final    Comment: (NOTE) The Xpert Xpress SARS-CoV-2/FLU/RSV plus assay is intended as an aid in the diagnosis of influenza from Nasopharyngeal swab specimens and should not be used as a sole basis for treatment. Nasal washings and aspirates are unacceptable for Xpert Xpress SARS-CoV-2/FLU/RSV testing.  Fact Sheet for Patients: EntrepreneurPulse.com.au  Fact Sheet for Healthcare Providers: IncredibleEmployment.be  This test is not yet approved or cleared by the [place] FDA and has been authorized for detection and/or diagnosis of SARS-CoV-2 by FDA under an Emergency Use Authorization (EUA). This EUA will remain in effect (meaning this test can be used) for the duration of the COVID-19 declaration under Section 564(b)(1) of the Act, 21 U.S.C. section 360bbb-3(b)(1), unless the authorization is terminated or revoked.  Performed at [facility], [address] [place], [place] [address]   Blood Culture (routine x 2)     Status: None (Preliminary result)   Collection Time: 06/08/21  3:50 AM   Specimen: BLOOD  Result Value Ref Range Status   Specimen Description BLOOD RIGHT [facility]  Final   Special Requests   Final    BOTTLES DRAWN AEROBIC AND ANAEROBIC Blood Culture adequate volume   Culture   Final    NO GROWTH 3 DAYS Performed at [facility], [address], [place], [place] [address]    Report Status PENDING  Incomplete  Blood Culture (routine x 2)     Status: None (Preliminary result)   Collection Time: 06/08/21  3:58 AM   Specimen: BLOOD  Result Value Ref Range Status   Specimen Description BLOOD  RIGHT HAND  Final   Special Requests   Final    BOTTLES DRAWN AEROBIC AND ANAEROBIC Blood Culture adequate volume   Culture   Final    NO GROWTH 3 DAYS Performed at [facility], [address], [place], [place] [address]    Report Status PENDING  Incomplete     Radiology Studies: No results found.  Scheduled Meds:  alfuzosin  10 mg Oral Q breakfast   aspirin EC  81 mg Oral Daily   [START ON 06/12/2021] azithromycin  500 mg Oral Daily   colchicine  0.3 mg Oral Daily   finasteride  5  mg Oral Daily   heparin  5,000 Units Subcutaneous Q8H   ipratropium-albuterol  3 mL Nebulization Q4H while awake   mometasone-formoterol  2 puff Inhalation BID   nicotine  21 mg Transdermal Daily   pantoprazole  40 mg Oral Daily   predniSONE  40 mg Oral Q breakfast   simvastatin  10 mg Oral QHS   verapamil  240 mg Oral BID   Continuous Infusions:  sodium chloride 35 mL/hr at 06/08/21 2331   cefTRIAXone (ROCEPHIN)  IV 2 g (06/11/21 1300)     LOS: 3 days    Enzo Bi, MD [facility]  If 7PM-7AM, please contact night-coverage [email or web address]  06/11/2021, 4:28 PM

## 2021-06-11 NOTE — Progress Notes (Signed)
[facility]  ROUNDING NOTE   Subjective:   George [name] is a 87 y.o. male who presents to ED via EMS for chest pain. Chest x-ray showed right lower lobe pneumonia. We were consulted to evaluate acute kidney injury.  Patient seen sitting up in bed Breakfast tray at bedside Patient states he has been sick all morning Nausea   Creatinine 2.61 (1.90) (4.07) (4.57) Ext catheter with 766m yellow urine  Objective:  Vital signs in last 24 hours:  Temp:  [97.5 F (36.4 C)-98.6 F (37 C)] 97.5 F (36.4 C) (09/01 1247) Pulse Rate:  [102-121] 121 (09/01 1247) Resp:  [16-18] 18 (09/01 1247) BP: (144-183)/(67-97) 144/67 (09/01 1247) SpO2:  [92 %-100 %] 96 % (09/01 0804) Weight:  [80.7 kg] 80.7 kg (09/01 0348)  Weight change: 1.225 kg Filed Weights   06/10/21 0700 06/10/21 0900 06/11/21 0348  Weight: 84.4 kg 81 kg 80.7 kg    Intake/Output: I/O last 3 completed shifts: In: 1096.8 [P.O.:240; IV Piggyback:856.8] Out: 1100 [Urine:1100]   Intake/Output this shift:  Total I/O In: 360 [P.O.:360] Out: 700 [Urine:700]  Physical Exam: General: NAD, resting in bed  Head: Normocephalic, atraumatic. Moist oral mucosal membranes  Eyes: Anicteric  Lungs:  Crackles bilaterally, normal effort, cough, O2 4L  Heart: Regular rate and rhythm  Abdomen:  Soft, nontender  Extremities:  no peripheral edema.  Neurologic: Nonfocal, moving all four extremities  Skin: No lesions       Basic Metabolic Panel: Recent Labs  Lab 06/08/21 0218 06/08/21 0853 06/09/21 0638 06/10/21 0536 06/11/21 0417  NA 138 134* 137 139 137  K 5.6* 5.6* 5.2* 3.8 4.9  CL 105 103 105 98 108  CO2 21* 16* 17* 33* 18*  GLUCOSE 108* 157* 136* 151* 187*  BUN 93* 95* 115* 31* 104*  CREATININE 4.83* 4.57* 4.07* 1.90* 2.61*  CALCIUM 8.4* 7.9* 7.9* 8.6* 8.2*  MG  --   --   --   --  2.1  PHOS  --   --   --  3.9  --      Liver Function Tests: Recent Labs  Lab 06/08/21 0218 06/10/21 0536  AST 32   --   ALT 18  --   ALKPHOS 51  --   BILITOT 1.0  --   PROT 6.2*  --   ALBUMIN 2.9* 2.8*    No results for input(s): LIPASE, AMYLASE in the last 168 hours. No results for input(s): AMMONIA in the last 168 hours.  CBC: Recent Labs  Lab 06/08/21 0218 06/09/21 0638 06/10/21 0536 06/11/21 0417  WBC 15.5* 18.3* 7.0 14.8*  NEUTROABS 14.0*  --   --   --   HGB 13.1 12.9* 11.4* 12.3*  HCT 37.4* 38.3* 37.3* 35.7*  MCV 88.6 89.1 93.3 86.2  PLT 260 234 201 252     Cardiac Enzymes: No results for input(s): CKTOTAL, CKMB, CKMBINDEX, TROPONINI in the last 168 hours.  BNP: Invalid input(s): POCBNP  CBG: Recent Labs  Lab 06/09/21 1644  GLUCAP 119*     Microbiology: Results for orders placed or performed during the hospital encounter of 06/08/21  Resp Panel by RT-PCR (Flu A&B, Covid) Nasopharyngeal Swab     Status: None   Collection Time: 06/08/21  2:22 AM   Specimen: Nasopharyngeal Swab; Nasopharyngeal(NP) swabs in vial transport medium  Result Value Ref Range Status   SARS Coronavirus 2 by RT PCR NEGATIVE NEGATIVE Final    Comment: (NOTE) SARS-CoV-2 target nucleic acids are  NOT DETECTED.  The SARS-CoV-2 RNA is generally detectable in upper respiratory specimens during the acute phase of infection. The lowest concentration of SARS-CoV-2 viral copies this assay can detect is 138 copies/mL. A negative result does not preclude SARS-Cov-2 infection and should not be used as the sole basis for treatment or other patient management decisions. A negative result may occur with  improper specimen collection/handling, submission of specimen other than nasopharyngeal swab, presence of viral mutation(s) within the areas targeted by this assay, and inadequate number of viral copies(<138 copies/mL). A negative result must be combined with clinical observations, patient history, and epidemiological information. The expected result is Negative.  Fact Sheet for Patients:   EntrepreneurPulse.com.au  Fact Sheet for Healthcare Providers:  IncredibleEmployment.be  This test is no t yet approved or cleared by the [place] FDA and  has been authorized for detection and/or diagnosis of SARS-CoV-2 by FDA under an Emergency Use Authorization (EUA). This EUA will remain  in effect (meaning this test can be used) for the duration of the COVID-19 declaration under Section 564(b)(1) of the Act, 21 U.S.C.section 360bbb-3(b)(1), unless the authorization is terminated  or revoked sooner.       Influenza A by PCR NEGATIVE NEGATIVE Final   Influenza B by PCR NEGATIVE NEGATIVE Final    Comment: (NOTE) The Xpert Xpress SARS-CoV-2/FLU/RSV plus assay is intended as an aid in the diagnosis of influenza from Nasopharyngeal swab specimens and should not be used as a sole basis for treatment. Nasal washings and aspirates are unacceptable for Xpert Xpress SARS-CoV-2/FLU/RSV testing.  Fact Sheet for Patients: EntrepreneurPulse.com.au  Fact Sheet for Healthcare Providers: IncredibleEmployment.be  This test is not yet approved or cleared by the [place] FDA and has been authorized for detection and/or diagnosis of SARS-CoV-2 by FDA under an Emergency Use Authorization (EUA). This EUA will remain in effect (meaning this test can be used) for the duration of the COVID-19 declaration under Section 564(b)(1) of the Act, 21 U.S.C. section 360bbb-3(b)(1), unless the authorization is terminated or revoked.  Performed at [facility], [address] [place], [place] [address]   Blood Culture (routine x 2)     Status: None (Preliminary result)   Collection Time: 06/08/21  3:50 AM   Specimen: BLOOD  Result Value Ref Range Status   Specimen Description BLOOD RIGHT [facility]  Final   Special Requests   Final    BOTTLES DRAWN AEROBIC AND ANAEROBIC Blood Culture adequate volume   Culture   Final     NO GROWTH 3 DAYS Performed at [facility], [address] [place], [place] [address]    Report Status PENDING  Incomplete  Blood Culture (routine x 2)     Status: None (Preliminary result)   Collection Time: 06/08/21  3:58 AM   Specimen: BLOOD  Result Value Ref Range Status   Specimen Description BLOOD RIGHT HAND  Final   Special Requests   Final    BOTTLES DRAWN AEROBIC AND ANAEROBIC Blood Culture adequate volume   Culture   Final    NO GROWTH 3 DAYS Performed at [facility], [address], [place], [place] [address]    Report Status PENDING  Incomplete    Coagulation Studies: No results for input(s): LABPROT, INR in the last 72 hours.  Urinalysis: No results for input(s): COLORURINE, LABSPEC, PHURINE, GLUCOSEU, HGBUR, BILIRUBINUR, KETONESUR, PROTEINUR, UROBILINOGEN, NITRITE, LEUKOCYTESUR in the last 72 hours.  Invalid input(s): APPERANCEUR     Imaging: No results found.  Medications:    sodium chloride 35 mL/hr at 06/08/21 2331   cefTRIAXone (ROCEPHIN)  IV 2 g (06/11/21 1300)    alfuzosin  10 mg Oral Q breakfast   aspirin EC  81 mg Oral Daily   [START ON 06/12/2021] azithromycin  500 mg Oral Daily   colchicine  0.3 mg Oral Daily   finasteride  5 mg Oral Daily   heparin  5,000 Units Subcutaneous Q8H   ipratropium-albuterol  3 mL Nebulization Q4H while awake   mometasone-formoterol  2 puff Inhalation BID   nicotine  21 mg Transdermal Daily   pantoprazole  40 mg Oral Daily   predniSONE  40 mg Oral Q breakfast   simvastatin  10 mg Oral QHS   verapamil  240 mg Oral BID   acetaminophen, albuterol, dextromethorphan-guaiFENesin, hydrALAZINE, hyoscyamine, LORazepam, ondansetron (ZOFRAN) IV, oxyCODONE-acetaminophen  Assessment/ Plan:  George [name] is a 87 y.o.  male ho presents to ED via EMS for chest pain. Chest x-ray showed right lower lobe pneumonia. We were consulted to evaluate acute kidney injury.  Acute Kidney Injury on chronic  kidney disease stage 3b with baseline creatinine 1.51 and GFR of 44 on 02/12/21.  Acute kidney injury secondary to ATN from ongoing sepsis Renal US has echogenic kidneys, enlarged prostate and no obstruction. UA negative for protein and blood. Creatinine elevated today, will monitor tomorrow Will need hospital follow up at our office when discharged.  Lab Results  Component Value Date   CREATININE 2.61 (H) 06/11/2021   CREATININE 1.90 (H) 06/10/2021   CREATININE 4.07 (H) 06/09/2021    Intake/Output Summary (Last 24 hours) at 06/11/2021 1506 Last data filed at 06/11/2021 1250 Gross per 24 hour  Intake 1115.89 ml  Output 1400 ml  Net -284.11 ml    2. Hyperkalemia  Currently 4.9  3.Acute on chronic resp failure due to sepsis Underlying severe emphysema RLL pneumonia seen on X-ray Antibiotics     LOS: 3 [place] 9/1/20223:06 PM

## 2021-06-12 LAB — CBC
HCT: 37.3 % — ABNORMAL LOW (ref 39.0–52.0)
Hemoglobin: 12.8 g/dL — ABNORMAL LOW (ref 13.0–17.0)
MCH: 29.4 pg (ref 26.0–34.0)
MCHC: 34.3 g/dL (ref 30.0–36.0)
MCV: 85.6 fL (ref 80.0–100.0)
Platelets: 292 10*3/uL (ref 150–400)
RBC: 4.36 MIL/uL (ref 4.22–5.81)
RDW: 15.6 % — ABNORMAL HIGH (ref 11.5–15.5)
WBC: 17.3 10*3/uL — ABNORMAL HIGH (ref 4.0–10.5)
nRBC: 0 % (ref 0.0–0.2)

## 2021-06-12 LAB — BASIC METABOLIC PANEL
Anion gap: 7 (ref 5–15)
BUN: 92 mg/dL — ABNORMAL HIGH (ref 8–23)
CO2: 20 mmol/L — ABNORMAL LOW (ref 22–32)
Calcium: 8.3 mg/dL — ABNORMAL LOW (ref 8.9–10.3)
Chloride: 112 mmol/L — ABNORMAL HIGH (ref 98–111)
Creatinine, Ser: 2.25 mg/dL — ABNORMAL HIGH (ref 0.61–1.24)
GFR, Estimated: 28 mL/min — ABNORMAL LOW (ref >60)
Glucose, Bld: 103 mg/dL — ABNORMAL HIGH (ref 70–99)
Potassium: 5 mmol/L (ref 3.5–5.1)
Sodium: 139 mmol/L (ref 135–145)

## 2021-06-12 LAB — MAGNESIUM: Magnesium: 2.3 mg/dL (ref 1.7–2.4)

## 2021-06-12 NOTE — Progress Notes (Signed)
PROGRESS NOTE    George [name]  ZLD:[id number] DOB: 09-22-1932 DOA: 06/08/2021 PCP: Juluis Pitch, MD   Brief Narrative: Taken from H&P.   Subjective: Pt reported feeling much better sitting up in recliner.  Didn't have an appetite.  Reported he couldn't get form bed to chair on his own.    Pt perseverates on how a doctor had ordered [facility] and later insurance company refused to pay for it.  When explained to him that we will try to get insurance to cover his SNF rehab stay, then pt agreed to it.   Assessment & Plan:   Principal Problem:   CAP (community acquired pneumonia) Active Problems:   Tobacco abuse   CAD (coronary artery disease)   Hyperlipidemia   Hypertension   Type II diabetes mellitus with renal manifestations (HCC)   Acute kidney injury superimposed on CKD IIIb (HCC)   BPH with obstruction/lower urinary tract symptoms   Hyperkalemia   Acute on chronic respiratory failure with hypoxia (HCC)   Gout   Sepsis ([place])   COPD exacerbation (HCC)  Sepsis secondary to community-acquired pneumonia and COPD exacerbation/acute hypoxic respiratory failure.  Patient met sepsis criteria with fever, leukocytosis, tachycardia and tachypnea.  Endorgan damage with AKI.  Hypotensive which improved with IV fluid.  Imaging concerning for right lung pneumonia.Strep pneumonia negative.  Blood cultures negative so far.  Procalcitonin elevated at 13.20.  He was started on ceftriaxone and Zithromax. -Based on the location of infiltrate-advanced age and mild underlying cognitive impairment he is high risk for aspiration.  Unable to answer any questions regarding coughing while eating.  Unable to reach the family. -Swallow evaluation found no significant aspiration Plan: --cont ceftriaxone and azithromycin, last day --cont prednisone 40 mg daily, will taper --DuoNeb q4h while awake --Dulera  AKI with CKD stage IIIb.   Baseline creatinine appears to be around 1.5.  It was 4.83 on  admission.  Nephrology was consulted.  Creatinine slowly improving with IV fluid, but had ups and downs. --NS_0 , per nephrology Plan: --d/c NS_1  today -Continue holding lisinopril and ibuprofen  CAD (coronary artery disease):  Currently no chest pain, troponin level 18, 14 --cont ASA and statin   Hyperlipidemia --cont statin   Hypertension.   Blood pressure within goal.  Antihypertensives initially held due to softer blood pressure. --cont home verapamil --hold Lisinopril  Tobacco abuse --cont nicotine patch  Diet-controlled diabetes, Type II diabetes mellitus with renal manifestations [facility]):  Recent A1c 5.1, well controlled.  Blood sugar within goal. --no need for BG checks.  BPH with obstruction/lower urinary tract symptoms --cont alfuzosin and proscar   Hyperkalemia:  -s/p Leukoma 10 g --monitor   Gout -cont Colchicine-dose adjusted according to the renal function.   Objective: Vitals:   06/12/21 1153 06/12/21 1155 06/12/21 1607 06/12/21 1632  BP: (!) 141/81   (!) 144/76  Pulse: (!) 108   (!) 104  Resp: 20   20  Temp: 98.2 F (36.8 C)   98 F (36.7 C)  TempSrc: Oral   Oral  SpO2: 97% 97% 96% 95%  Weight:      Height:        Intake/Output Summary (Last 24 hours) at 06/12/2021 1756 Last data filed at 06/12/2021 1632 Gross per 24 hour  Intake 100 ml  Output 1750 ml  Net -1650 ml   Filed Weights   06/10/21 0900 06/11/21 0348 06/12/21 0500  Weight: 81 kg 80.7 kg 81.6 kg    Examination:  Constitutional: NAD, alert, oriented  to person and place, more calm today, sitting in recliner HEENT: conjunctivae and lids normal, EOMI, severely hard of hearing CV: No cyanosis.   RESP: normal respiratory effort, on 4L Extremities: No effusions, edema in BLE SKIN: warm, dry Neuro: II - XII grossly intact.     DVT prophylaxis: Subcu heparin Code Status: DNR Family Communication: pt is estranged from his son.  Updated Christina at bedside on 9/1, who is his  caregiver friend (not related).    Status is: Inpatient  Dispo:   The patient is from: home Anticipated d/c is to: SNF Anticipated d/c date is: 1-2 days Patient currently is not medically stable to d/c due to: still on 4L O2 and IV abx   All the records are reviewed and case discussed with Care Management/Social Worker. Management plans discussed with the patient, nursing and they are in agreement.  Consultants:  Nephrology  Procedures:  Antimicrobials:  Unasyn.  Data Reviewed: I have personally reviewed following labs and imaging studies  CBC: Recent Labs  Lab 06/08/21 0218 06/09/21 0638 06/10/21 0536 06/11/21 0417 06/12/21 0433  WBC 15.5* 18.3* 7.0 14.8* 17.3*  NEUTROABS 14.0*  --   --   --   --   HGB 13.1 12.9* 11.4* 12.3* 12.8*  HCT 37.4* 38.3* 37.3* 35.7* 37.3*  MCV 88.6 89.1 93.3 86.2 85.6  PLT 260 234 201 252 165   Basic Metabolic Panel: Recent Labs  Lab 06/08/21 0853 06/09/21 0638 06/10/21 0536 06/11/21 0417 06/12/21 0433  NA 134* 137 139 137 139  K 5.6* 5.2* 3.8 4.9 5.0  CL 103 105 98 108 112*  CO2 16* 17* 33* 18* 20*  GLUCOSE 157* 136* 151* 187* 103*  BUN 95* 115* 31* 104* 92*  CREATININE 4.57* 4.07* 1.90* 2.61* 2.25*  CALCIUM 7.9* 7.9* 8.6* 8.2* 8.3*  MG  --   --   --  2.1 2.3  PHOS  --   --  3.9  --   --    GFR: Estimated Creatinine Clearance: 23.2 mL/min (A) (by C-G formula based on SCr of 2.25 mg/dL (H)). Liver Function Tests: Recent Labs  Lab 06/08/21 0218 06/10/21 0536  AST 32  --   ALT 18  --   ALKPHOS 51  --   BILITOT 1.0  --   PROT 6.2*  --   ALBUMIN 2.9* 2.8*   No results for input(s): LIPASE, AMYLASE in the last 168 hours. No results for input(s): AMMONIA in the last 168 hours. Coagulation Profile: No results for input(s): INR, PROTIME in the last 168 hours. Cardiac Enzymes: No results for input(s): CKTOTAL, CKMB, CKMBINDEX, TROPONINI in the last 168 hours. BNP (last 3 results) No results for input(s): PROBNP in the  last 8760 hours. HbA1C: No results for input(s): HGBA1C in the last 72 hours. CBG: Recent Labs  Lab 06/09/21 1644  GLUCAP 119*   Lipid Profile: No results for input(s): CHOL, HDL, LDLCALC, TRIG, CHOLHDL, LDLDIRECT in the last 72 hours. Thyroid Function Tests: No results for input(s): TSH, T4TOTAL, FREET4, T3FREE, THYROIDAB in the last 72 hours. Anemia Panel: No results for input(s): VITAMINB12, FOLATE, FERRITIN, TIBC, IRON, RETICCTPCT in the last 72 hours. Sepsis Labs: Recent Labs  Lab 06/08/21 0358 06/08/21 0607  PROCALCITON 13.20  --   LATICACIDVEN 1.3 1.1    Recent Results (from the past 240 hour(s))  Resp Panel by RT-PCR (Flu A&B, Covid) Nasopharyngeal Swab     Status: None   Collection Time: 06/08/21  2:22 AM  Specimen: Nasopharyngeal Swab; Nasopharyngeal(NP) swabs in vial transport medium  Result Value Ref Range Status   SARS Coronavirus 2 by RT PCR NEGATIVE NEGATIVE Final    Comment: (NOTE) SARS-CoV-2 target nucleic acids are NOT DETECTED.  The SARS-CoV-2 RNA is generally detectable in upper respiratory specimens during the acute phase of infection. The lowest concentration of SARS-CoV-2 viral copies this assay can detect is 138 copies/mL. A negative result does not preclude SARS-Cov-2 infection and should not be used as the sole basis for treatment or other patient management decisions. A negative result may occur with  improper specimen collection/handling, submission of specimen other than nasopharyngeal swab, presence of viral mutation(s) within the areas targeted by this assay, and inadequate number of viral copies(<138 copies/mL). A negative result must be combined with clinical observations, patient history, and epidemiological information. The expected result is Negative.  Fact Sheet for Patients:  EntrepreneurPulse.com.au  Fact Sheet for Healthcare Providers:  IncredibleEmployment.be  This test is no t yet  approved or cleared by the [place] FDA and  has been authorized for detection and/or diagnosis of SARS-CoV-2 by FDA under an Emergency Use Authorization (EUA). This EUA will remain  in effect (meaning this test can be used) for the duration of the COVID-19 declaration under Section 564(b)(1) of the Act, 21 U.S.C.section 360bbb-3(b)(1), unless the authorization is terminated  or revoked sooner.       Influenza A by PCR NEGATIVE NEGATIVE Final   Influenza B by PCR NEGATIVE NEGATIVE Final    Comment: (NOTE) The Xpert Xpress SARS-CoV-2/FLU/RSV plus assay is intended as an aid in the diagnosis of influenza from Nasopharyngeal swab specimens and should not be used as a sole basis for treatment. Nasal washings and aspirates are unacceptable for Xpert Xpress SARS-CoV-2/FLU/RSV testing.  Fact Sheet for Patients: EntrepreneurPulse.com.au  Fact Sheet for Healthcare Providers: IncredibleEmployment.be  This test is not yet approved or cleared by the [place] FDA and has been authorized for detection and/or diagnosis of SARS-CoV-2 by FDA under an Emergency Use Authorization (EUA). This EUA will remain in effect (meaning this test can be used) for the duration of the COVID-19 declaration under Section 564(b)(1) of the Act, 21 U.S.C. section 360bbb-3(b)(1), unless the authorization is terminated or revoked.  Performed at [facility], [address] [place], [place] [address]   Blood Culture (routine x 2)     Status: None (Preliminary result)   Collection Time: 06/08/21  3:50 AM   Specimen: BLOOD  Result Value Ref Range Status   Specimen Description BLOOD RIGHT [facility]  Final   Special Requests   Final    BOTTLES DRAWN AEROBIC AND ANAEROBIC Blood Culture adequate volume   Culture   Final    NO GROWTH 4 DAYS Performed at [facility], [address], [place], [place] [address]    Report Status PENDING  Incomplete  Blood  Culture (routine x 2)     Status: None (Preliminary result)   Collection Time: 06/08/21  3:58 AM   Specimen: BLOOD  Result Value Ref Range Status   Specimen Description BLOOD RIGHT HAND  Final   Special Requests   Final    BOTTLES DRAWN AEROBIC AND ANAEROBIC Blood Culture adequate volume   Culture   Final    NO GROWTH 4 DAYS Performed at [facility], [address], [place],  [address]    Report Status PENDING  Incomplete     Radiology Studies: No results found.  Scheduled Meds:  alfuzosin  10 mg Oral Q breakfast   aspirin EC  81 mg Oral Daily   colchicine  0.3 mg Oral Daily   finasteride  5 mg Oral Daily   heparin  5,000 Units Subcutaneous Q8H   ipratropium-albuterol  3 mL Nebulization Q4H while awake   mometasone-formoterol  2 puff Inhalation BID   nicotine  21 mg Transdermal Daily   pantoprazole  40 mg Oral Daily   predniSONE  40 mg Oral Q breakfast   simvastatin  10 mg Oral QHS   verapamil  240 mg Oral BID   Continuous Infusions:  sodium chloride 35 mL/hr at 06/08/21 2331     LOS: 4 days    Enzo Bi, MD [facility]  If 7PM-7AM, please contact night-coverage [email or web address]  06/12/2021, 5:56 PM

## 2021-06-12 NOTE — Progress Notes (Signed)
[facility]  ROUNDING NOTE   Subjective:   George [name] is a 87 y.o. male who presents to ED via EMS for chest pain. Chest x-ray showed right lower lobe pneumonia. We were consulted to evaluate acute kidney injury.  Patient seen resting in bed Alert and oriented States he feels bad, unable to describe  Patient seen later sitting in chair Breakfast tray at bedside, helped peel banana and offer to pateint States he feels better sitting up   Creatinine 2.25 (2.61) (1.90) (4.07) (4.57) Ext catheter with 1920m yellow urine  Objective:  Vital signs in last 24 hours:  Temp:  [97.4 F (36.3 C)-98.2 F (36.8 C)] 98.2 F (36.8 C) (09/02 0640) Pulse Rate:  [101-121] 101 (09/02 0640) Resp:  [16-20] 20 (09/02 0640) BP: (118-180)/(60-96) 118/60 (09/02 0640) SpO2:  [97 %-100 %] 97 % (09/02 0847) Weight:  [81.6 kg] 81.6 kg (09/02 0500)  Weight change: 0.635 kg Filed Weights   06/10/21 0900 06/11/21 0348 06/12/21 0500  Weight: 81 kg 80.7 kg 81.6 kg    Intake/Output: I/O last 3 completed shifts: In: 1215.9 [P.O.:360; IV Piggyback:855.9] Out: 2675 [Urine:2675]   Intake/Output this shift:  Total I/O In: -  Out: 425 [Urine:425]  Physical Exam: General: NAD, resting in bed  Head: Normocephalic, atraumatic. Moist oral mucosal membranes  Eyes: Anicteric  Lungs:  Clear throughout, normal effort, cough, O2 4L  Heart: Regular rate and rhythm  Abdomen:  Soft, nontender  Extremities:  no peripheral edema.  Neurologic: Nonfocal, moving all four extremities  Skin: No lesions       Basic Metabolic Panel: Recent Labs  Lab 06/08/21 0853 06/09/21 0638 06/10/21 0536 06/11/21 0417 06/12/21 0433  NA 134* 137 139 137 139  K 5.6* 5.2* 3.8 4.9 5.0  CL 103 105 98 108 112*  CO2 16* 17* 33* 18* 20*  GLUCOSE 157* 136* 151* 187* 103*  BUN 95* 115* 31* 104* 92*  CREATININE 4.57* 4.07* 1.90* 2.61* 2.25*  CALCIUM 7.9* 7.9* 8.6* 8.2* 8.3*  MG  --   --   --  2.1 2.3  PHOS   --   --  3.9  --   --      Liver Function Tests: Recent Labs  Lab 06/08/21 0218 06/10/21 0536  AST 32  --   ALT 18  --   ALKPHOS 51  --   BILITOT 1.0  --   PROT 6.2*  --   ALBUMIN 2.9* 2.8*    No results for input(s): LIPASE, AMYLASE in the last 168 hours. No results for input(s): AMMONIA in the last 168 hours.  CBC: Recent Labs  Lab 06/08/21 0218 06/09/21 [id number] 0536 06/11/21 0417 06/12/21 0433  WBC 15.5* 18.3* 7.0 14.8* 17.3*  NEUTROABS 14.0*  --   --   --   --   HGB 13.1 12.9* 11.4* 12.3* 12.8*  HCT 37.4* 38.3* 37.3* 35.7* 37.3*  MCV 88.6 89.1 93.3 86.2 85.6  PLT 260 234 201 252 292     Cardiac Enzymes: No results for input(s): CKTOTAL, CKMB, CKMBINDEX, TROPONINI in the last 168 hours.  BNP: Invalid input(s): POCBNP  CBG: Recent Labs  Lab 06/09/21 1644  GLUCAP 119*     Microbiology: Results for orders placed or performed during the hospital encounter of 06/08/21  Resp Panel by RT-PCR (Flu A&B, Covid) Nasopharyngeal Swab     Status: None   Collection Time: 06/08/21  2:22 AM   Specimen: Nasopharyngeal Swab; Nasopharyngeal(NP) swabs in vial  transport medium  Result Value Ref Range Status   SARS Coronavirus 2 by RT PCR NEGATIVE NEGATIVE Final    Comment: (NOTE) SARS-CoV-2 target nucleic acids are NOT DETECTED.  The SARS-CoV-2 RNA is generally detectable in upper respiratory specimens during the acute phase of infection. The lowest concentration of SARS-CoV-2 viral copies this assay can detect is 138 copies/mL. A negative result does not preclude SARS-Cov-2 infection and should not be used as the sole basis for treatment or other patient management decisions. A negative result may occur with  improper specimen collection/handling, submission of specimen other than nasopharyngeal swab, presence of viral mutation(s) within the areas targeted by this assay, and inadequate number of viral copies(<138 copies/mL). A negative result must be combined  with clinical observations, patient history, and epidemiological information. The expected result is Negative.  Fact Sheet for Patients:  EntrepreneurPulse.com.au  Fact Sheet for Healthcare Providers:  IncredibleEmployment.be  This test is no t yet approved or cleared by the [place] FDA and  has been authorized for detection and/or diagnosis of SARS-CoV-2 by FDA under an Emergency Use Authorization (EUA). This EUA will remain  in effect (meaning this test can be used) for the duration of the COVID-19 declaration under Section 564(b)(1) of the Act, 21 U.S.C.section 360bbb-3(b)(1), unless the authorization is terminated  or revoked sooner.       Influenza A by PCR NEGATIVE NEGATIVE Final   Influenza B by PCR NEGATIVE NEGATIVE Final    Comment: (NOTE) The Xpert Xpress SARS-CoV-2/FLU/RSV plus assay is intended as an aid in the diagnosis of influenza from Nasopharyngeal swab specimens and should not be used as a sole basis for treatment. Nasal washings and aspirates are unacceptable for Xpert Xpress SARS-CoV-2/FLU/RSV testing.  Fact Sheet for Patients: EntrepreneurPulse.com.au  Fact Sheet for Healthcare Providers: IncredibleEmployment.be  This test is not yet approved or cleared by the [place] FDA and has been authorized for detection and/or diagnosis of SARS-CoV-2 by FDA under an Emergency Use Authorization (EUA). This EUA will remain in effect (meaning this test can be used) for the duration of the COVID-19 declaration under Section 564(b)(1) of the Act, 21 U.S.C. section 360bbb-3(b)(1), unless the authorization is terminated or revoked.  Performed at [facility], [address] [place], [place] [address]   Blood Culture (routine x 2)     Status: None (Preliminary result)   Collection Time: 06/08/21  3:50 AM   Specimen: BLOOD  Result Value Ref Range Status   Specimen  Description BLOOD RIGHT [facility]  Final   Special Requests   Final    BOTTLES DRAWN AEROBIC AND ANAEROBIC Blood Culture adequate volume   Culture   Final    NO GROWTH 4 DAYS Performed at [facility], [address] [place], [place] [address]    Report Status PENDING  Incomplete  Blood Culture (routine x 2)     Status: None (Preliminary result)   Collection Time: 06/08/21  3:58 AM   Specimen: BLOOD  Result Value Ref Range Status   Specimen Description BLOOD RIGHT HAND  Final   Special Requests   Final    BOTTLES DRAWN AEROBIC AND ANAEROBIC Blood Culture adequate volume   Culture   Final    NO GROWTH 4 DAYS Performed at [facility], [address], [place], [place] [address]    Report Status PENDING  Incomplete    Coagulation Studies: No results for input(s): LABPROT, INR in the last 72 hours.  Urinalysis: No results for  input(s): COLORURINE, LABSPEC, PHURINE, GLUCOSEU, HGBUR, BILIRUBINUR, KETONESUR, PROTEINUR, UROBILINOGEN, NITRITE, LEUKOCYTESUR in the last 72 hours.  Invalid input(s): APPERANCEUR     Imaging: No results found.   Medications:    sodium chloride 35 mL/hr at 06/08/21 2331   cefTRIAXone (ROCEPHIN)  IV Stopped (06/11/21 1330)    alfuzosin  10 mg Oral Q breakfast   aspirin EC  81 mg Oral Daily   azithromycin  500 mg Oral Daily   colchicine  0.3 mg Oral Daily   finasteride  5 mg Oral Daily   heparin  5,000 Units Subcutaneous Q8H   ipratropium-albuterol  3 mL Nebulization Q4H while awake   mometasone-formoterol  2 puff Inhalation BID   nicotine  21 mg Transdermal Daily   pantoprazole  40 mg Oral Daily   predniSONE  40 mg Oral Q breakfast   simvastatin  10 mg Oral QHS   verapamil  240 mg Oral BID   acetaminophen, albuterol, dextromethorphan-guaiFENesin, hydrALAZINE, hyoscyamine, LORazepam, ondansetron (ZOFRAN) IV, oxyCODONE-acetaminophen  Assessment/ Plan:  George [name] is a 87 y.o.  male ho presents to ED via EMS for chest  pain. Chest x-ray showed right lower lobe pneumonia. We were consulted to evaluate acute kidney injury.  Acute Kidney Injury on chronic kidney disease stage 3b with baseline creatinine 1.51 and GFR of 44 on 02/12/21.  Acute kidney injury secondary to ATN from ongoing sepsis Renal US has echogenic kidneys, enlarged prostate and no obstruction. UA negative for protein and blood. Creatinine improved today, increased UOP Will need hospital follow up at our office when discharged.  Lab Results  Component Value Date   CREATININE 2.25 (H) 06/12/2021   CREATININE 2.61 (H) 06/11/2021   CREATININE 1.90 (H) 06/10/2021    Intake/Output Summary (Last 24 hours) at 06/12/2021 0939 Last data filed at 06/12/2021 [id number] Gross per 24 hour  Intake 340 ml  Output 2400 ml  Net -2060 ml    2. Hyperkalemia  Currently 5.0  3.Acute on chronic resp failure due to sepsis Underlying severe emphysema RLL pneumonia seen on X-ray Antibiotics     LOS: 4   9/2/20229:39 AM

## 2021-06-12 NOTE — Progress Notes (Signed)
Physical Therapy Treatment Patient Details Name: George [name] MRN: [id number] DOB: November 30, 1931 Today's Date: 06/12/2021    History of Present Illness Pt is an 87 y.o. male with medical history significant of CKD-IIIb, hypertension, hyperlipidemia, diabetes mellitus, COPD, GERD, gout, OSA, CAD, stent placement, colon cancer, BPH, anemia, tobacco abuse, and per patient L TSA April 2022 who presented with shortness of breath. MD assessment includes: Sepsis secondary to community-acquired pneumonia and COPD exacerbation/acute hypoxic respiratory failure, AKI, and hyperkalemia.    PT Comments    Pt's HR high this am, therapist returned in pm, HR 108 bpm at rest. Pt agreed to attempt PT as able.  Pt required ModA after several attempts to stand without assist. Pt tolerated ~20 seconds with flexed forward posture on RW for support. Increased SOB upon exertion with c/o slight dizziness. Pt assisted back to sitting. O2 sats 88%, HR increased to 125 bpm.  Pt returned to baseline after short rest break. Discussed POC with pt who has been refusing SNF for financial reasons and now agrees.  Continue PT per POC until d/c.   Follow Up Recommendations  SNF;Supervision/Assistance - 24 hour     Equipment Recommendations  None recommended by PT    Recommendations for Other Services       Precautions / Restrictions Precautions Precautions: Fall Restrictions Weight Bearing Restrictions: No    Mobility  Bed Mobility                    Transfers Overall transfer level: Needs assistance Equipment used: Rolling walker (2 wheeled) Transfers: Sit to/from Stand Sit to Stand: Mod assist (from recliner after several attempts)         General transfer comment: Pt dizzy after standing x 20 seconds, HR increased from 108 to 125bpm  Ambulation/Gait             General Gait Details: Unable to tolerate at this time due to increased HR   Stairs             Wheelchair Mobility     Modified Rankin (Stroke Patients Only)       Balance                                            Cognition Arousal/Alertness: Awake/alert Behavior During Therapy: WFL for tasks assessed/performed Overall Cognitive Status: No family/caregiver present to determine baseline cognitive functioning                                 General Comments: Pt alert and oriented to self, place, and parts of situation.  Very HOH      Exercises      General Comments General comments (skin integrity, edema, etc.): Pt on 4L      Pertinent Vitals/Pain Pain Assessment: No/denies pain    Home Living                      Prior Function            PT Goals (current goals can now be found in the care plan section) Acute Rehab PT Goals Patient Stated Goal: To be stronger    Frequency    Min 2X/week      PT Plan      Co-evaluation  AM-PAC PT "6 Clicks" Mobility   Outcome Measure  Help needed turning from your back to your side while in a flat bed without using bedrails?: A Little Help needed moving from lying on your back to sitting on the side of a flat bed without using bedrails?: A Little Help needed moving to and from a bed to a chair (including a wheelchair)?: A Lot Help needed standing up from a chair using your arms (e.g., wheelchair or bedside chair)?: A Lot Help needed to walk in hospital room?: A Lot Help needed climbing 3-5 steps with a railing? : Total 6 Click Score: 13    End of Session Equipment Utilized During Treatment: Gait belt;Oxygen (4L O2 via [place]) Activity Tolerance: Patient limited by fatigue Patient left: in chair;with call bell/phone within reach;with chair alarm set Nurse Communication: Other (comment);Mobility status (Pt 95% at rest on 4L which decreased to 88% upon standing) PT Visit Diagnosis: History of falling (Z91.81);Difficulty in walking, not elsewhere classified (R26.2);Muscle weakness  (generalized) (M62.81)     Time: [id number] PT Time Calculation (min) (ACUTE ONLY): 25 min  Charges:  $Therapeutic Activity: 23-37 mins                    Mikel Cella, PTA    Josie Dixon 06/12/2021, 3:52 PM

## 2021-06-12 NOTE — TOC Progression Note (Signed)
Transition of Care [facility]) - Progression Note    Patient Details  Name: MAJEED TAMASHIRO MRN: [id number] Date of Birth: 1932-05-01  Transition of Care [facility]) CM/SW Contact  Shelbie Hutching, RN Phone Number: 06/12/2021, 4:14 PM  Clinical Narrative:    MD spoke with patient at the bedside.  He is extremely hard of hearing.  Patient is not safe to discharge home alone even with home health.  He agrees to SNF- originally he declined because he thought his insurance would not pay.  Bed search started.     Expected Discharge Plan: [place] Barriers to Discharge: Continued Medical Work up  Expected Discharge Plan and Services Expected Discharge Plan: [place]   Discharge Planning Services: CM Consult Post Acute Care Choice: [place] Living arrangements for the past 2 months: Single Family Home                 DME Arranged: N/A DME Agency: [employer]       HH Arranged: PT, OT, RN, Nurse's Aide, Social Work [employer] Agency: [place] ([place]) Date [place]: 06/11/21 Time [place]: [place] Representative spoke with at [place]: [place] ([place]) Interventions    Readmission Risk Interventions No flowsheet data found.

## 2021-06-13 LAB — CULTURE, BLOOD (ROUTINE X 2)
Culture: NO GROWTH
Culture: NO GROWTH
Special Requests: ADEQUATE
Special Requests: ADEQUATE

## 2021-06-13 LAB — BASIC METABOLIC PANEL
Anion gap: 7 (ref 5–15)
BUN: 92 mg/dL — ABNORMAL HIGH (ref 8–23)
CO2: 20 mmol/L — ABNORMAL LOW (ref 22–32)
Calcium: 8.6 mg/dL — ABNORMAL LOW (ref 8.9–10.3)
Chloride: 112 mmol/L — ABNORMAL HIGH (ref 98–111)
Creatinine, Ser: 1.97 mg/dL — ABNORMAL HIGH (ref 0.61–1.24)
GFR, Estimated: 32 mL/min — ABNORMAL LOW (ref >60)
Glucose, Bld: 99 mg/dL (ref 70–99)
Potassium: 5.4 mmol/L — ABNORMAL HIGH (ref 3.5–5.1)
Sodium: 139 mmol/L (ref 135–145)

## 2021-06-13 LAB — CBC
HCT: 36.1 % — ABNORMAL LOW (ref 39.0–52.0)
Hemoglobin: 12.8 g/dL — ABNORMAL LOW (ref 13.0–17.0)
MCH: 30.9 pg (ref 26.0–34.0)
MCHC: 35.5 g/dL (ref 30.0–36.0)
MCV: 87.2 fL (ref 80.0–100.0)
Platelets: 291 10*3/uL (ref 150–400)
RBC: 4.14 MIL/uL — ABNORMAL LOW (ref 4.22–5.81)
RDW: 15.5 % (ref 11.5–15.5)
WBC: 18.5 10*3/uL — ABNORMAL HIGH (ref 4.0–10.5)
nRBC: 0 % (ref 0.0–0.2)

## 2021-06-13 LAB — MAGNESIUM: Magnesium: 2.2 mg/dL (ref 1.7–2.4)

## 2021-06-13 MED ORDER — IPRATROPIUM-ALBUTEROL 0.5-2.5 (3) MG/3ML IN SOLN
3.0000 mL | Freq: Three times a day (TID) | RESPIRATORY_TRACT | Status: DC
  Administered 2021-06-13 – 2021-06-15 (×6): 3 mL via RESPIRATORY_TRACT
  Filled 2021-06-13 (×5): qty 3

## 2021-06-13 NOTE — Progress Notes (Signed)
PROGRESS NOTE    George [name]  [id number] DOB: March 19, 1932 DOA: 06/08/2021 PCP: Juluis Pitch, MD   Brief Narrative: Taken from H&P.   Subjective: When asked about his breathing, pt was surprised to realize that it's much better than at presentation.    Still perseverated about how insurance company didn't pay for the [facility] ordered by his doctor.   Assessment & Plan:   Principal Problem:   CAP (community acquired pneumonia) Active Problems:   Tobacco abuse   CAD (coronary artery disease)   Hyperlipidemia   Hypertension   Type II diabetes mellitus with renal manifestations (HCC)   Acute kidney injury superimposed on CKD IIIb (HCC)   BPH with obstruction/lower urinary tract symptoms   Hyperkalemia   Acute on chronic respiratory failure with hypoxia (HCC)   Gout   Sepsis ([place])   COPD exacerbation (HCC)  Sepsis secondary to community-acquired pneumonia and COPD exacerbation acute hypoxic respiratory failure.  Patient met sepsis criteria with fever, leukocytosis, tachycardia and tachypnea.  Endorgan damage with AKI.  Hypotensive which improved with IV fluid.  Imaging concerning for right lung pneumonia.Strep pneumonia negative.  Blood cultures negative so far.  Procalcitonin elevated at 13.20.  He was started on ceftriaxone and Zithromax. -Based on the location of infiltrate-advanced age and mild underlying cognitive impairment he is high risk for aspiration.  Unable to answer any questions regarding coughing while eating.  Unable to reach the family. -Swallow evaluation found no significant aspiration --completed a course of IV abx with azithromycin and ceftriaxone. Plan: --cont prednisone 40 mg daily, will taper --DuoNeb q4h while awake --Dulera --Continue supplemental O2 to keep sats between 88-92%, wean as tolerated  AKI with CKD stage IIIb.   Baseline creatinine appears to be around 1.5.  It was 4.83 on admission.  Nephrology was consulted.  Creatinine slowly  improving with IV fluid, but had ups and downs. --NS'@35' , per nephrology, d/c'ed on 9/2 Plan: --encourage oral hydration -Continue holding lisinopril and ibuprofen  CAD (coronary artery disease):  Currently no chest pain, troponin level 18, 14 --cont ASA and statin   Hyperlipidemia --cont statin   Hypertension.   Blood pressure within goal.  Antihypertensives initially held due to softer blood pressure. --cont home verapamil --hold Lisinopril  Tobacco abuse --cont nicotine patch  Diet-controlled diabetes, Type II diabetes mellitus with renal manifestations [facility]):  Recent A1c 5.1, well controlled.  Blood sugar within goal. --no need for BG checks.  BPH with obstruction/lower urinary tract symptoms --cont alfuzosin and proscar   Hyperkalemia:  -s/p Leukoma 10 g --monitor   Gout -cont Colchicine-dose adjusted according to the renal function.   Objective: Vitals:   06/13/21 1255 06/13/21 1556 06/13/21 2135 06/13/21 2138  BP: (!) 146/67 (!) 153/66 (!) 153/75 (!) 158/74  Pulse: (!) 105 (!) 108 (!) 107 (!) 104  Resp: '19 19 19 19  ' Temp: 97.8 F (36.6 C) 98 F (36.7 C) (!) 97.3 F (36.3 C) 97.8 F (36.6 C)  TempSrc: Oral     SpO2: 98% 97% 96% 98%  Weight:      Height:        Intake/Output Summary (Last 24 hours) at 06/13/2021 2301 Last data filed at 06/13/2021 0740 Gross per 24 hour  Intake --  Output 950 ml  Net -950 ml   Filed Weights   06/10/21 0900 06/11/21 0348 06/12/21 0500  Weight: 81 kg 80.7 kg 81.6 kg    Examination:  Constitutional: NAD, AAOx3, perseverated  HEENT: conjunctivae and lids normal,  EOMI, exteremly hard of hearing CV: No cyanosis.   RESP: normal respiratory effort, on 4L Extremities: No effusions, edema in BLE SKIN: warm, dry Neuro: II - XII grossly intact.     DVT prophylaxis: Subcu heparin Code Status: DNR Family Communication: pt is estranged from his son.  Updated Christina at bedside on 9/1, who is his caregiver friend (not  related).    Status is: Inpatient  Dispo:   The patient is from: home Anticipated d/c is to: SNF Anticipated d/c date is: whenever bed available Patient currently is stable to d/c.   All the records are reviewed and case discussed with Care Management/Social Worker. Management plans discussed with the patient, nursing and they are in agreement.  Consultants:  Nephrology  Procedures:  Antimicrobials:  Unasyn.  Data Reviewed: I have personally reviewed following labs and imaging studies  CBC: Recent Labs  Lab 06/08/21 0218 06/09/21 3491 06/10/21 0536 06/11/21 0417 06/12/21 0433 06/13/21 0443  WBC 15.5* 18.3* 7.0 14.8* 17.3* 18.5*  NEUTROABS 14.0*  --   --   --   --   --   HGB 13.1 12.9* 11.4* 12.3* 12.8* 12.8*  HCT 37.4* 38.3* 37.3* 35.7* 37.3* 36.1*  MCV 88.6 89.1 93.3 86.2 85.6 87.2  PLT 260 234 201 252 292 791   Basic Metabolic Panel: Recent Labs  Lab 06/09/21 0638 06/10/21 0536 06/11/21 0417 06/12/21 0433 06/13/21 0443  NA 137 139 137 139 139  K 5.2* 3.8 4.9 5.0 5.4*  CL 105 98 108 112* 112*  CO2 17* 33* 18* 20* 20*  GLUCOSE 136* 151* 187* 103* 99  BUN 115* 31* 104* 92* 92*  CREATININE 4.07* 1.90* 2.61* 2.25* 1.97*  CALCIUM 7.9* 8.6* 8.2* 8.3* 8.6*  MG  --   --  2.1 2.3 2.2  PHOS  --  3.9  --   --   --    GFR: Estimated Creatinine Clearance: 26.5 mL/min (A) (by C-G formula based on SCr of 1.97 mg/dL (H)). Liver Function Tests: Recent Labs  Lab 06/08/21 0218 06/10/21 0536  AST 32  --   ALT 18  --   ALKPHOS 51  --   BILITOT 1.0  --   PROT 6.2*  --   ALBUMIN 2.9* 2.8*   No results for input(s): LIPASE, AMYLASE in the last 168 hours. No results for input(s): AMMONIA in the last 168 hours. Coagulation Profile: No results for input(s): INR, PROTIME in the last 168 hours. Cardiac Enzymes: No results for input(s): CKTOTAL, CKMB, CKMBINDEX, TROPONINI in the last 168 hours. BNP (last 3 results) No results for input(s): PROBNP in the last 8760  hours. HbA1C: No results for input(s): HGBA1C in the last 72 hours. CBG: Recent Labs  Lab 06/09/21 1644  GLUCAP 119*   Lipid Profile: No results for input(s): CHOL, HDL, LDLCALC, TRIG, CHOLHDL, LDLDIRECT in the last 72 hours. Thyroid Function Tests: No results for input(s): TSH, T4TOTAL, FREET4, T3FREE, THYROIDAB in the last 72 hours. Anemia Panel: No results for input(s): VITAMINB12, FOLATE, FERRITIN, TIBC, IRON, RETICCTPCT in the last 72 hours. Sepsis Labs: Recent Labs  Lab 06/08/21 0358 06/08/21 0607  PROCALCITON 13.20  --   LATICACIDVEN 1.3 1.1    Recent Results (from the past 240 hour(s))  Resp Panel by RT-PCR (Flu A&B, Covid) Nasopharyngeal Swab     Status: None   Collection Time: 06/08/21  2:22 AM   Specimen: Nasopharyngeal Swab; Nasopharyngeal(NP) swabs in vial transport medium  Result Value Ref Range Status  SARS Coronavirus 2 by RT PCR NEGATIVE NEGATIVE Final    Comment: (NOTE) SARS-CoV-2 target nucleic acids are NOT DETECTED.  The SARS-CoV-2 RNA is generally detectable in upper respiratory specimens during the acute phase of infection. The lowest concentration of SARS-CoV-2 viral copies this assay can detect is 138 copies/mL. A negative result does not preclude SARS-Cov-2 infection and should not be used as the sole basis for treatment or other patient management decisions. A negative result may occur with  improper specimen collection/handling, submission of specimen other than nasopharyngeal swab, presence of viral mutation(s) within the areas targeted by this assay, and inadequate number of viral copies(<138 copies/mL). A negative result must be combined with clinical observations, patient history, and epidemiological information. The expected result is Negative.  Fact Sheet for Patients:  EntrepreneurPulse.com.au  Fact Sheet for Healthcare Providers:  IncredibleEmployment.be  This test is no t yet approved or  cleared by the [place] FDA and  has been authorized for detection and/or diagnosis of SARS-CoV-2 by FDA under an Emergency Use Authorization (EUA). This EUA will remain  in effect (meaning this test can be used) for the duration of the COVID-19 declaration under Section 564(b)(1) of the Act, 21 U.S.C.section 360bbb-3(b)(1), unless the authorization is terminated  or revoked sooner.       Influenza A by PCR NEGATIVE NEGATIVE Final   Influenza B by PCR NEGATIVE NEGATIVE Final    Comment: (NOTE) The Xpert Xpress SARS-CoV-2/FLU/RSV plus assay is intended as an aid in the diagnosis of influenza from Nasopharyngeal swab specimens and should not be used as a sole basis for treatment. Nasal washings and aspirates are unacceptable for Xpert Xpress SARS-CoV-2/FLU/RSV testing.  Fact Sheet for Patients: EntrepreneurPulse.com.au  Fact Sheet for Healthcare Providers: IncredibleEmployment.be  This test is not yet approved or cleared by the [place] FDA and has been authorized for detection and/or diagnosis of SARS-CoV-2 by FDA under an Emergency Use Authorization (EUA). This EUA will remain in effect (meaning this test can be used) for the duration of the COVID-19 declaration under Section 564(b)(1) of the Act, 21 U.S.C. section 360bbb-3(b)(1), unless the authorization is terminated or revoked.  Performed at [facility], [address] [place], [place] [address]   Blood Culture (routine x 2)     Status: None   Collection Time: 06/08/21  3:50 AM   Specimen: BLOOD  Result Value Ref Range Status   Specimen Description BLOOD RIGHT [facility]  Final   Special Requests   Final    BOTTLES DRAWN AEROBIC AND ANAEROBIC Blood Culture adequate volume   Culture   Final    NO GROWTH 5 DAYS Performed at [facility], [address], [place], [place] [address]    Report Status 06/13/2021 FINAL  Final  Blood Culture (routine x 2)     Status:  None   Collection Time: 06/08/21  3:58 AM   Specimen: BLOOD  Result Value Ref Range Status   Specimen Description BLOOD RIGHT HAND  Final   Special Requests   Final    BOTTLES DRAWN AEROBIC AND ANAEROBIC Blood Culture adequate volume   Culture   Final    NO GROWTH 5 DAYS Performed at [facility], [address] [place], [place] [address]    Report Status 06/13/2021 FINAL  Final     Radiology Studies: No results found.  Scheduled Meds:  alfuzosin  10 mg Oral Q breakfast   aspirin EC  81 mg Oral Daily   colchicine  0.3 mg Oral Daily   finasteride  5 mg Oral Daily   heparin  5,000 Units Subcutaneous Q8H   ipratropium-albuterol  3 mL Nebulization TID   mometasone-formoterol  2 puff Inhalation BID   nicotine  21 mg Transdermal Daily   pantoprazole  40 mg Oral Daily   predniSONE  40 mg Oral Q breakfast   simvastatin  10 mg Oral QHS   verapamil  240 mg Oral BID   Continuous Infusions:     LOS: 5 days    Enzo Bi, MD [facility]  If 7PM-7AM, please contact night-coverage [email or web address]  06/13/2021, 11:01 PM

## 2021-06-13 NOTE — Progress Notes (Signed)
[facility]  ROUNDING NOTE   Subjective:   George [name] is a 87 y.o. male who presents to ED via EMS for chest pain. Chest x-ray showed right lower lobe pneumonia. We were consulted to evaluate acute kidney injury.  Patient seen sitting in chair Very upset that his breakfast has not arrived States he feels better today, just hungry Patient seen later complaining of being hot then cold frequently. Breakfast tray at bedside, small amount eaten.   Creatinine 1.97 (2.25) (2.61) (1.90) (4.07) (4.57) Ext catheter with 1725 ml   Objective:  Vital signs in last 24 hours:  Temp:  [97.7 F (36.5 C)-98.3 F (36.8 C)] 97.7 F (36.5 C) (09/03 0740) Pulse Rate:  [91-108] 101 (09/03 0740) Resp:  [16-20] 18 (09/03 0740) BP: (137-159)/(73-81) 150/73 (09/03 0740) SpO2:  [92 %-98 %] 95 % (09/03 0740)  Weight change:  Filed Weights   06/10/21 0900 06/11/21 0348 06/12/21 0500  Weight: 81 kg 80.7 kg 81.6 kg    Intake/Output: I/O last 3 completed shifts: In: 100 [IV Piggyback:100] Out: 2550 [Urine:2550]   Intake/Output this shift:  Total I/O In: -  Out: 150 [Urine:150]  Physical Exam: General: NAD, sitting in chair  Head: Normocephalic, atraumatic. Moist oral mucosal membranes  Eyes: Anicteric  Lungs:  Clear bilaterally, normal effort, cough, O2 2L  Heart: Regular rate and rhythm  Abdomen:  Soft, nontender  Extremities:  no peripheral edema.  Neurologic: Nonfocal, moving all four extremities  Skin: No lesions       Basic Metabolic Panel: Recent Labs  Lab 06/09/21 0638 06/10/21 0536 06/11/21 0417 06/12/21 0433 06/13/21 0443  NA 137 139 137 139 139  K 5.2* 3.8 4.9 5.0 5.4*  CL 105 98 108 112* 112*  CO2 17* 33* 18* 20* 20*  GLUCOSE 136* 151* 187* 103* 99  BUN 115* 31* 104* 92* 92*  CREATININE 4.07* 1.90* 2.61* 2.25* 1.97*  CALCIUM 7.9* 8.6* 8.2* 8.3* 8.6*  MG  --   --  2.1 2.3 2.2  PHOS  --  3.9  --   --   --      Liver Function Tests: Recent Labs   Lab 06/08/21 0218 06/10/21 0536  AST 32  --   ALT 18  --   ALKPHOS 51  --   BILITOT 1.0  --   PROT 6.2*  --   ALBUMIN 2.9* 2.8*    No results for input(s): LIPASE, AMYLASE in the last 168 hours. No results for input(s): AMMONIA in the last 168 hours.  CBC: Recent Labs  Lab 06/08/21 0218 06/09/21 [id number] 06/10/21 0536 06/11/21 0417 06/12/21 0433 06/13/21 0443  WBC 15.5* 18.3* 7.0 14.8* 17.3* 18.5*  NEUTROABS 14.0*  --   --   --   --   --   HGB 13.1 12.9* 11.4* 12.3* 12.8* 12.8*  HCT 37.4* 38.3* 37.3* 35.7* 37.3* 36.1*  MCV 88.6 89.1 93.3 86.2 85.6 87.2  PLT 260 234 201 252 292 291     Cardiac Enzymes: No results for input(s): CKTOTAL, CKMB, CKMBINDEX, TROPONINI in the last 168 hours.  BNP: Invalid input(s): POCBNP  CBG: Recent Labs  Lab 06/09/21 1644  GLUCAP 119*     Microbiology: Results for orders placed or performed during the hospital encounter of 06/08/21  Resp Panel by RT-PCR (Flu A&B, Covid) Nasopharyngeal Swab     Status: None   Collection Time: 06/08/21  2:22 AM   Specimen: Nasopharyngeal Swab; Nasopharyngeal(NP) swabs in vial transport medium  Result Value Ref Range Status   SARS Coronavirus 2 by RT PCR NEGATIVE NEGATIVE Final    Comment: (NOTE) SARS-CoV-2 target nucleic acids are NOT DETECTED.  The SARS-CoV-2 RNA is generally detectable in upper respiratory specimens during the acute phase of infection. The lowest concentration of SARS-CoV-2 viral copies this assay can detect is 138 copies/mL. A negative result does not preclude SARS-Cov-2 infection and should not be used as the sole basis for treatment or other patient management decisions. A negative result may occur with  improper specimen collection/handling, submission of specimen other than nasopharyngeal swab, presence of viral mutation(s) within the areas targeted by this assay, and inadequate number of viral copies(<138 copies/mL). A negative result must be combined with clinical  observations, patient history, and epidemiological information. The expected result is Negative.  Fact Sheet for Patients:  EntrepreneurPulse.com.au  Fact Sheet for Healthcare Providers:  IncredibleEmployment.be  This test is no t yet approved or cleared by the [place] FDA and  has been authorized for detection and/or diagnosis of SARS-CoV-2 by FDA under an Emergency Use Authorization (EUA). This EUA will remain  in effect (meaning this test can be used) for the duration of the COVID-19 declaration under Section 564(b)(1) of the Act, 21 U.S.C.section 360bbb-3(b)(1), unless the authorization is terminated  or revoked sooner.       Influenza A by PCR NEGATIVE NEGATIVE Final   Influenza B by PCR NEGATIVE NEGATIVE Final    Comment: (NOTE) The Xpert Xpress SARS-CoV-2/FLU/RSV plus assay is intended as an aid in the diagnosis of influenza from Nasopharyngeal swab specimens and should not be used as a sole basis for treatment. Nasal washings and aspirates are unacceptable for Xpert Xpress SARS-CoV-2/FLU/RSV testing.  Fact Sheet for Patients: EntrepreneurPulse.com.au  Fact Sheet for Healthcare Providers: IncredibleEmployment.be  This test is not yet approved or cleared by the [place] FDA and has been authorized for detection and/or diagnosis of SARS-CoV-2 by FDA under an Emergency Use Authorization (EUA). This EUA will remain in effect (meaning this test can be used) for the duration of the COVID-19 declaration under Section 564(b)(1) of the Act, 21 U.S.C. section 360bbb-3(b)(1), unless the authorization is terminated or revoked.  Performed at [facility], [address] [place], [place] [address]   Blood Culture (routine x 2)     Status: None   Collection Time: 06/08/21  3:50 AM   Specimen: BLOOD  Result Value Ref Range Status   Specimen Description BLOOD RIGHT [facility]  Final   Special  Requests   Final    BOTTLES DRAWN AEROBIC AND ANAEROBIC Blood Culture adequate volume   Culture   Final    NO GROWTH 5 DAYS Performed at [facility], [address] [place], [place] [address]    Report Status 06/13/2021 FINAL  Final  Blood Culture (routine x 2)     Status: None   Collection Time: 06/08/21  3:58 AM   Specimen: BLOOD  Result Value Ref Range Status   Specimen Description BLOOD RIGHT HAND  Final   Special Requests   Final    BOTTLES DRAWN AEROBIC AND ANAEROBIC Blood Culture adequate volume   Culture   Final    NO GROWTH 5 DAYS Performed at [facility], [address], [place], [place] [address]    Report Status 06/13/2021 FINAL  Final    Coagulation Studies: No results for input(s): LABPROT, INR in the last 72 hours.  Urinalysis: No results for input(s): COLORURINE, LABSPEC, [place], [place],  HGBUR, BILIRUBINUR, KETONESUR, PROTEINUR, UROBILINOGEN, NITRITE, LEUKOCYTESUR in the last 72 hours.  Invalid input(s): APPERANCEUR     Imaging: No results found.   Medications:      alfuzosin  10 mg Oral Q breakfast   aspirin EC  81 mg Oral Daily   colchicine  0.3 mg Oral Daily   finasteride  5 mg Oral Daily   heparin  5,000 Units Subcutaneous Q8H   ipratropium-albuterol  3 mL Nebulization TID   mometasone-formoterol  2 puff Inhalation BID   nicotine  21 mg Transdermal Daily   pantoprazole  40 mg Oral Daily   predniSONE  40 mg Oral Q breakfast   simvastatin  10 mg Oral QHS   verapamil  240 mg Oral BID   acetaminophen, albuterol, dextromethorphan-guaiFENesin, hydrALAZINE, hyoscyamine, LORazepam, ondansetron (ZOFRAN) IV, oxyCODONE-acetaminophen  Assessment/ Plan:  George [name] is a 87 y.o.  male ho presents to ED via EMS for chest pain. Chest x-ray showed right lower lobe pneumonia. We were consulted to evaluate acute kidney injury.  Acute Kidney Injury on chronic kidney disease stage 3b with baseline creatinine 1.51 and GFR of 44  on 02/12/21.  Acute kidney injury secondary to ATN from ongoing sepsis Renal US has echogenic kidneys, enlarged prostate and no obstruction. UA negative for protein and blood. Creatinine greatly improved from admission Remains above baseline, encourage oral nutrition Hospital follow up at our office when discharged.  Lab Results  Component Value Date   CREATININE 1.97 (H) 06/13/2021   CREATININE 2.25 (H) 06/12/2021   CREATININE 2.61 (H) 06/11/2021    Intake/Output Summary (Last 24 hours) at 06/13/2021 1011 Last data filed at 06/13/2021 0740 Gross per 24 hour  Intake --  Output 1450 ml  Net -1450 ml    2. Hyperkalemia  Currently 5.4 IF remains elevated tomorrow, will consider [place]  3.Acute on chronic resp failure due to sepsis Underlying severe emphysema RLL pneumonia seen on X-ray Treating with antibiotics     LOS: 5   9/3/202210:11 AM

## 2021-06-14 LAB — CBC
HCT: 35.1 % — ABNORMAL LOW (ref 39.0–52.0)
Hemoglobin: 12.2 g/dL — ABNORMAL LOW (ref 13.0–17.0)
MCH: 29.8 pg (ref 26.0–34.0)
MCHC: 34.8 g/dL (ref 30.0–36.0)
MCV: 85.6 fL (ref 80.0–100.0)
Platelets: 290 10*3/uL (ref 150–400)
RBC: 4.1 MIL/uL — ABNORMAL LOW (ref 4.22–5.81)
RDW: 15.3 % (ref 11.5–15.5)
WBC: 19.5 10*3/uL — ABNORMAL HIGH (ref 4.0–10.5)
nRBC: 0 % (ref 0.0–0.2)

## 2021-06-14 LAB — BASIC METABOLIC PANEL
Anion gap: 5 (ref 5–15)
BUN: 82 mg/dL — ABNORMAL HIGH (ref 8–23)
CO2: 18 mmol/L — ABNORMAL LOW (ref 22–32)
Calcium: 8.8 mg/dL — ABNORMAL LOW (ref 8.9–10.3)
Chloride: 117 mmol/L — ABNORMAL HIGH (ref 98–111)
Creatinine, Ser: 2.03 mg/dL — ABNORMAL HIGH (ref 0.61–1.24)
GFR, Estimated: 31 mL/min — ABNORMAL LOW (ref >60)
Glucose, Bld: 105 mg/dL — ABNORMAL HIGH (ref 70–99)
Potassium: 5.6 mmol/L — ABNORMAL HIGH (ref 3.5–5.1)
Sodium: 140 mmol/L (ref 135–145)

## 2021-06-14 LAB — MAGNESIUM: Magnesium: 2.2 mg/dL (ref 1.7–2.4)

## 2021-06-14 MED ORDER — PATIROMER SORBITEX CALCIUM 8.4 G PO PACK
8.4000 g | PACK | Freq: Once | ORAL | Status: AC
  Administered 2021-06-14: 14:00:00 8.4 g via ORAL
  Filled 2021-06-14: qty 1

## 2021-06-14 MED ORDER — PANTOPRAZOLE SODIUM 40 MG PO TBEC
40.0000 mg | DELAYED_RELEASE_TABLET | Freq: Every day | ORAL | Status: DC
  Administered 2021-06-15 – 2021-06-16 (×2): 40 mg via ORAL
  Filled 2021-06-14 (×2): qty 1

## 2021-06-14 MED ORDER — PANTOPRAZOLE SODIUM 20 MG PO TBEC: 20.0000 mg | DELAYED_RELEASE_TABLET | Freq: Every day | ORAL | Status: DC

## 2021-06-14 MED ORDER — PREDNISONE 20 MG PO TABS
20.0000 mg | ORAL_TABLET | Freq: Every day | ORAL | Status: DC
  Administered 2021-06-15 – 2021-06-16 (×2): 20 mg via ORAL
  Filled 2021-06-14 (×2): qty 1

## 2021-06-14 NOTE — Progress Notes (Signed)
[facility]  ROUNDING NOTE   Subjective:   George [name] is a 87 y.o. male who presents to ED via EMS for chest pain. Chest x-ray showed right lower lobe pneumonia. We were consulted to evaluate acute kidney injury.  Patient resting in bed Alert and oriented Tolerating breakfast without nausea and vomiting Denies shortness of breath Complains of being weak Patient seen later siting up in chair  Creatinine 2.03 (1.97) (2.25) (2.61) (1.90) (4.07) (4.57) UOP 1860 ml  Objective:  Vital signs in last 24 hours:  Temp:  [97.3 F (36.3 C)-98.1 F (36.7 C)] 97.9 F (36.6 C) (09/04 0755) Pulse Rate:  [84-108] 107 (09/04 0755) Resp:  [16-20] 20 (09/04 0755) BP: (129-158)/(63-79) 158/79 (09/04 0755) SpO2:  [94 %-98 %] 96 % (09/04 0755)  Weight change:  Filed Weights   06/10/21 0900 06/11/21 0348 06/12/21 0500  Weight: 81 kg 80.7 kg 81.6 kg    Intake/Output: I/O last 3 completed shifts: In: -  Out: 2660 [Urine:2660]   Intake/Output this shift:  Total I/O In: -  Out: 250 [Urine:250]  Physical Exam: General: NAD, sitting in chair  Head: Normocephalic, atraumatic. Moist oral mucosal membranes  Eyes: Anicteric  Lungs:  Clear bilaterally, normal effort, O2 2L  Heart: Regular rate and rhythm  Abdomen:  Soft, nontender  Extremities:  no peripheral edema.  Neurologic: Nonfocal, moving all four extremities  Skin: No lesions       Basic Metabolic Panel: Recent Labs  Lab 06/10/21 0536 06/11/21 0417 06/12/21 0433 06/13/21 0443 06/14/21 0455  NA 139 137 139 139 140  K 3.8 4.9 5.0 5.4* 5.6*  CL 98 108 112* 112* 117*  CO2 33* 18* 20* 20* 18*  GLUCOSE 151* 187* 103* 99 105*  BUN 31* 104* 92* 92* 82*  CREATININE 1.90* 2.61* 2.25* 1.97* 2.03*  CALCIUM 8.6* 8.2* 8.3* 8.6* 8.8*  MG  --  2.1 2.3 2.2 2.2  PHOS 3.9  --   --   --   --      Liver Function Tests: Recent Labs  Lab 06/08/21 0218 06/10/21 0536  AST 32  --   ALT 18  --   ALKPHOS 51  --    BILITOT 1.0  --   PROT 6.2*  --   ALBUMIN 2.9* 2.8*    No results for input(s): LIPASE, AMYLASE in the last 168 hours. No results for input(s): AMMONIA in the last 168 hours.  CBC: Recent Labs  Lab 06/08/21 0218 06/09/21 [id number] 06/10/21 0536 06/11/21 0417 06/12/21 0433 06/13/21 0443 06/14/21 0455  WBC 15.5*   < > 7.0 14.8* 17.3* 18.5* 19.5*  NEUTROABS 14.0*  --   --   --   --   --   --   HGB 13.1   < > 11.4* 12.3* 12.8* 12.8* 12.2*  HCT 37.4*   < > 37.3* 35.7* 37.3* 36.1* 35.1*  MCV 88.6   < > 93.3 86.2 85.6 87.2 85.6  PLT 260   < > 201 252 292 291 290   < > = values in this interval not displayed.     Cardiac Enzymes: No results for input(s): CKTOTAL, CKMB, CKMBINDEX, TROPONINI in the last 168 hours.  BNP: Invalid input(s): POCBNP  CBG: Recent Labs  Lab 06/09/21 [place]*     Microbiology: Results for orders placed or performed during the hospital encounter of 06/08/21  Resp Panel by RT-PCR (Flu A&B, Covid) Nasopharyngeal Swab     Status: None  Collection Time: 06/08/21  2:22 AM   Specimen: Nasopharyngeal Swab; Nasopharyngeal(NP) swabs in vial transport medium  Result Value Ref Range Status   SARS Coronavirus 2 by RT PCR NEGATIVE NEGATIVE Final    Comment: (NOTE) SARS-CoV-2 target nucleic acids are NOT DETECTED.  The SARS-CoV-2 RNA is generally detectable in upper respiratory specimens during the acute phase of infection. The lowest concentration of SARS-CoV-2 viral copies this assay can detect is 138 copies/mL. A negative result does not preclude SARS-Cov-2 infection and should not be used as the sole basis for treatment or other patient management decisions. A negative result may occur with  improper specimen collection/handling, submission of specimen other than nasopharyngeal swab, presence of viral mutation(s) within the areas targeted by this assay, and inadequate number of viral copies(<138 copies/mL). A negative result must be combined  with clinical observations, patient history, and epidemiological information. The expected result is Negative.  Fact Sheet for Patients:  EntrepreneurPulse.com.au  Fact Sheet for Healthcare Providers:  IncredibleEmployment.be  This test is no t yet approved or cleared by the [place] FDA and  has been authorized for detection and/or diagnosis of SARS-CoV-2 by FDA under an Emergency Use Authorization (EUA). This EUA will remain  in effect (meaning this test can be used) for the duration of the COVID-19 declaration under Section 564(b)(1) of the Act, 21 U.S.C.section 360bbb-3(b)(1), unless the authorization is terminated  or revoked sooner.       Influenza A by PCR NEGATIVE NEGATIVE Final   Influenza B by PCR NEGATIVE NEGATIVE Final    Comment: (NOTE) The Xpert Xpress SARS-CoV-2/FLU/RSV plus assay is intended as an aid in the diagnosis of influenza from Nasopharyngeal swab specimens and should not be used as a sole basis for treatment. Nasal washings and aspirates are unacceptable for Xpert Xpress SARS-CoV-2/FLU/RSV testing.  Fact Sheet for Patients: EntrepreneurPulse.com.au  Fact Sheet for Healthcare Providers: IncredibleEmployment.be  This test is not yet approved or cleared by the [place] FDA and has been authorized for detection and/or diagnosis of SARS-CoV-2 by FDA under an Emergency Use Authorization (EUA). This EUA will remain in effect (meaning this test can be used) for the duration of the COVID-19 declaration under Section 564(b)(1) of the Act, 21 U.S.C. section 360bbb-3(b)(1), unless the authorization is terminated or revoked.  Performed at [facility], [address] [place], [place] [address]   Blood Culture (routine x 2)     Status: None   Collection Time: 06/08/21  3:50 AM   Specimen: BLOOD  Result Value Ref Range Status   Specimen Description BLOOD RIGHT [facility]   Final   Special Requests   Final    BOTTLES DRAWN AEROBIC AND ANAEROBIC Blood Culture adequate volume   Culture   Final    NO GROWTH 5 DAYS Performed at [facility], [address], [place], [place] [address]    Report Status 06/13/2021 FINAL  Final  Blood Culture (routine x 2)     Status: None   Collection Time: 06/08/21  3:58 AM   Specimen: BLOOD  Result Value Ref Range Status   Specimen Description BLOOD RIGHT HAND  Final   Special Requests   Final    BOTTLES DRAWN AEROBIC AND ANAEROBIC Blood Culture adequate volume   Culture   Final    NO GROWTH 5 DAYS Performed at [facility], [address] [place], [place] [address]    Report Status 06/13/2021 FINAL  Final    Coagulation Studies: No results for  input(s): LABPROT, INR in the last 72 hours.  Urinalysis: No results for input(s): COLORURINE, LABSPEC, PHURINE, GLUCOSEU, HGBUR, BILIRUBINUR, KETONESUR, PROTEINUR, UROBILINOGEN, NITRITE, LEUKOCYTESUR in the last 72 hours.  Invalid input(s): APPERANCEUR     Imaging: No results found.   Medications:      alfuzosin  10 mg Oral Q breakfast   aspirin EC  81 mg Oral Daily   colchicine  0.3 mg Oral Daily   finasteride  5 mg Oral Daily   heparin  5,000 Units Subcutaneous Q8H   ipratropium-albuterol  3 mL Nebulization TID   mometasone-formoterol  2 puff Inhalation BID   nicotine  21 mg Transdermal Daily   pantoprazole  40 mg Oral Daily   patiromer  8.4 g Oral Once   predniSONE  40 mg Oral Q breakfast   simvastatin  10 mg Oral QHS   verapamil  240 mg Oral BID   acetaminophen, albuterol, dextromethorphan-guaiFENesin, hydrALAZINE, hyoscyamine, LORazepam, ondansetron (ZOFRAN) IV  Assessment/ Plan:  Mr. George [name] is a 87 y.o.  male ho presents to ED via EMS for chest pain. Chest x-ray showed right lower lobe pneumonia. We were consulted to evaluate acute kidney injury.  Acute Kidney Injury on chronic kidney disease stage 3b with baseline  creatinine 1.51 and GFR of 44 on 02/12/21.  Acute kidney injury secondary to ATN from ongoing sepsis Renal US has echogenic kidneys, enlarged prostate and no obstruction. UA negative for protein and blood. Creatinine stable today Will need hospital follow up after discharge  Lab Results  Component Value Date   CREATININE 2.03 (H) 06/14/2021   CREATININE 1.97 (H) 06/13/2021   CREATININE 2.25 (H) 06/12/2021    Intake/Output Summary (Last 24 hours) at 06/14/2021 1013 Last data filed at 06/14/2021 0756 Gross per 24 hour  Intake --  Output 1960 ml  Net -1960 ml    2. Hyperkalemia  Currently 5.6 Veltassa 8.4 mg once ordered  3.Acute on chronic resp failure due to sepsis Underlying severe emphysema, RLL pneumonia seen on X-ray Completed antibiotic regimen    LOS: 6   9/4/202210:13 AM

## 2021-06-14 NOTE — Progress Notes (Signed)
PROGRESS NOTE    George [name]  ION:[id number] DOB: 06/26/1932 DOA: 06/08/2021 PCP: Juluis Pitch, MD   Brief Narrative: Taken from H&P.   Subjective: Eating with oxygen off, no dyspnea.   Assessment & Plan:   Principal Problem:   CAP (community acquired pneumonia) Active Problems:   Tobacco abuse   CAD (coronary artery disease)   Hyperlipidemia   Hypertension   Type II diabetes mellitus with renal manifestations (HCC)   Acute kidney injury superimposed on CKD IIIb (HCC)   BPH with obstruction/lower urinary tract symptoms   Hyperkalemia   Acute on chronic respiratory failure with hypoxia (HCC)   Gout   Sepsis ([place])   COPD exacerbation (HCC)  Sepsis secondary to community-acquired pneumonia and COPD exacerbation acute hypoxic respiratory failure.  Patient met sepsis criteria with fever, leukocytosis, tachycardia and tachypnea.  Endorgan damage with AKI.  Hypotensive which improved with IV fluid.  Imaging concerning for right lung pneumonia.Strep pneumonia negative.  Blood cultures negative so far.  Procalcitonin elevated at 13.20.  He was started on ceftriaxone and Zithromax. -Based on the location of infiltrate-advanced age and mild underlying cognitive impairment he is high risk for aspiration.  Unable to answer any questions regarding coughing while eating.  Unable to reach the family. -Swallow evaluation found no significant aspiration --completed a course of IV abx with azithromycin and ceftriaxone. --weaned down to RA today. Plan: --taper prednisone to 20 mg daily tomorrow --DuoNeb q4h while awake --Dulera  AKI with CKD stage IIIb.   Baseline creatinine appears to be around 1.5.  It was 4.83 on admission.  Nephrology was consulted.  Creatinine slowly improving with IV fluid, but had ups and downs. --NS_0 , per nephrology, d/c'ed on 9/2 Plan: --encourage oral hydration -Continue holding lisinopril and ibuprofen  CAD (coronary artery disease):  Currently no  chest pain, troponin level 18, 14 --cont ASA and statin   Hyperlipidemia --cont statin   Hypertension.   Blood pressure within goal.  Antihypertensives initially held due to softer blood pressure. --cont home verapamil --hold Lisinopril  Tobacco abuse --cont nicotine patch  Diet-controlled diabetes, Type II diabetes mellitus with renal manifestations [facility]):  Recent A1c 5.1, well controlled.  Blood sugar within goal. --no need for BG checks.  BPH with obstruction/lower urinary tract symptoms --cont alfuzosin and proscar   Hyperkalemia:  -s/p Leukoma 10 g --Veltassa x1 today   Gout -cont Colchicine-dose adjusted according to the renal function.   Objective: Vitals:   06/14/21 0725 06/14/21 0755 06/14/21 1135 06/14/21 1422  BP:  (!) 158/79 (!) 147/58   Pulse:  (!) 107 97   Resp:  20 18   Temp:  97.9 F (36.6 C) 97.9 F (36.6 C)   TempSrc:      SpO2: 94% 96% 96% 93%  Weight:      Height:        Intake/Output Summary (Last 24 hours) at 06/14/2021 1608 Last data filed at 06/14/2021 1135 Gross per 24 hour  Intake --  Output 2210 ml  Net -2210 ml   Filed Weights   06/10/21 0900 06/11/21 0348 06/12/21 0500  Weight: 81 kg 80.7 kg 81.6 kg    Examination:  Constitutional: NAD, alert, oriented to person and place, sitting up in recliner  HEENT: conjunctivae and lids normal, EOMI, extremely hard of hearing CV: No cyanosis.   RESP: normal respiratory effort, on RA Extremities: No effusions, edema in BLE SKIN: warm, dry Neuro: II - XII grossly intact.     DVT prophylaxis:  Subcu heparin Code Status: DNR Family Communication: pt is estranged from his son.  Updated Christina at bedside on 9/1, who is his caregiver friend (not related).    Status is: Inpatient  Dispo:   The patient is from: home Anticipated d/c is to: SNF Anticipated d/c date is: whenever bed available Patient currently is stable to d/c.   All the records are reviewed and case discussed with  Care Management/Social Worker. Management plans discussed with the patient, nursing and they are in agreement.  Consultants:  Nephrology  Procedures:  Antimicrobials:  Unasyn.  Data Reviewed: I have personally reviewed following labs and imaging studies  CBC: Recent Labs  Lab 06/08/21 0218 06/09/21 5885 06/10/21 0536 06/11/21 0417 06/12/21 0433 06/13/21 0443 06/14/21 0455  WBC 15.5*   < > 7.0 14.8* 17.3* 18.5* 19.5*  NEUTROABS 14.0*  --   --   --   --   --   --   HGB 13.1   < > 11.4* 12.3* 12.8* 12.8* 12.2*  HCT 37.4*   < > 37.3* 35.7* 37.3* 36.1* 35.1*  MCV 88.6   < > 93.3 86.2 85.6 87.2 85.6  PLT 260   < > 201 252 292 291 290   < > = values in this interval not displayed.   Basic Metabolic Panel: Recent Labs  Lab 06/10/21 0536 06/11/21 0417 06/12/21 0433 06/13/21 0443 06/14/21 0455  NA 139 137 139 139 140  K 3.8 4.9 5.0 5.4* 5.6*  CL 98 108 112* 112* 117*  CO2 33* 18* 20* 20* 18*  GLUCOSE 151* 187* 103* 99 105*  BUN 31* 104* 92* 92* 82*  CREATININE 1.90* 2.61* 2.25* 1.97* 2.03*  CALCIUM 8.6* 8.2* 8.3* 8.6* 8.8*  MG  --  2.1 2.3 2.2 2.2  PHOS 3.9  --   --   --   --    GFR: Estimated Creatinine Clearance: 25.7 mL/min (A) (by C-G formula based on SCr of 2.03 mg/dL (H)). Liver Function Tests: Recent Labs  Lab 06/08/21 0218 06/10/21 0536  AST 32  --   ALT 18  --   ALKPHOS 51  --   BILITOT 1.0  --   PROT 6.2*  --   ALBUMIN 2.9* 2.8*   No results for input(s): LIPASE, AMYLASE in the last 168 hours. No results for input(s): AMMONIA in the last 168 hours. Coagulation Profile: No results for input(s): INR, PROTIME in the last 168 hours. Cardiac Enzymes: No results for input(s): CKTOTAL, CKMB, CKMBINDEX, TROPONINI in the last 168 hours. BNP (last 3 results) No results for input(s): PROBNP in the last 8760 hours. HbA1C: No results for input(s): HGBA1C in the last 72 hours. CBG: Recent Labs  Lab 06/09/21 1644  GLUCAP 119*   Lipid Profile: No  results for input(s): CHOL, HDL, LDLCALC, TRIG, CHOLHDL, LDLDIRECT in the last 72 hours. Thyroid Function Tests: No results for input(s): TSH, T4TOTAL, FREET4, T3FREE, THYROIDAB in the last 72 hours. Anemia Panel: No results for input(s): VITAMINB12, FOLATE, FERRITIN, TIBC, IRON, RETICCTPCT in the last 72 hours. Sepsis Labs: Recent Labs  Lab 06/08/21 0358 06/08/21 0607  PROCALCITON 13.20  --   LATICACIDVEN 1.3 1.1    Recent Results (from the past 240 hour(s))  Resp Panel by RT-PCR (Flu A&B, Covid) Nasopharyngeal Swab     Status: None   Collection Time: 06/08/21  2:22 AM   Specimen: Nasopharyngeal Swab; Nasopharyngeal(NP) swabs in vial transport medium  Result Value Ref Range Status   SARS Coronavirus 2  by RT PCR NEGATIVE NEGATIVE Final    Comment: (NOTE) SARS-CoV-2 target nucleic acids are NOT DETECTED.  The SARS-CoV-2 RNA is generally detectable in upper respiratory specimens during the acute phase of infection. The lowest concentration of SARS-CoV-2 viral copies this assay can detect is 138 copies/mL. A negative result does not preclude SARS-Cov-2 infection and should not be used as the sole basis for treatment or other patient management decisions. A negative result may occur with  improper specimen collection/handling, submission of specimen other than nasopharyngeal swab, presence of viral mutation(s) within the areas targeted by this assay, and inadequate number of viral copies(<138 copies/mL). A negative result must be combined with clinical observations, patient history, and epidemiological information. The expected result is Negative.  Fact Sheet for Patients:  EntrepreneurPulse.com.au  Fact Sheet for Healthcare Providers:  IncredibleEmployment.be  This test is no t yet approved or cleared by the [place] FDA and  has been authorized for detection and/or diagnosis of SARS-CoV-2 by FDA under an Emergency Use Authorization  (EUA). This EUA will remain  in effect (meaning this test can be used) for the duration of the COVID-19 declaration under Section 564(b)(1) of the Act, 21 U.S.C.section 360bbb-3(b)(1), unless the authorization is terminated  or revoked sooner.       Influenza A by PCR NEGATIVE NEGATIVE Final   Influenza B by PCR NEGATIVE NEGATIVE Final    Comment: (NOTE) The Xpert Xpress SARS-CoV-2/FLU/RSV plus assay is intended as an aid in the diagnosis of influenza from Nasopharyngeal swab specimens and should not be used as a sole basis for treatment. Nasal washings and aspirates are unacceptable for Xpert Xpress SARS-CoV-2/FLU/RSV testing.  Fact Sheet for Patients: EntrepreneurPulse.com.au  Fact Sheet for Healthcare Providers: IncredibleEmployment.be  This test is not yet approved or cleared by the [place] FDA and has been authorized for detection and/or diagnosis of SARS-CoV-2 by FDA under an Emergency Use Authorization (EUA). This EUA will remain in effect (meaning this test can be used) for the duration of the COVID-19 declaration under Section 564(b)(1) of the Act, 21 U.S.C. section 360bbb-3(b)(1), unless the authorization is terminated or revoked.  Performed at [facility], [address] [place], [place] [address]   Blood Culture (routine x 2)     Status: None   Collection Time: 06/08/21  3:50 AM   Specimen: BLOOD  Result Value Ref Range Status   Specimen Description BLOOD RIGHT [facility]  Final   Special Requests   Final    BOTTLES DRAWN AEROBIC AND ANAEROBIC Blood Culture adequate volume   Culture   Final    NO GROWTH 5 DAYS Performed at [facility], [address], [place], [place] [address]    Report Status 06/13/2021 FINAL  Final  Blood Culture (routine x 2)     Status: None   Collection Time: 06/08/21  3:58 AM   Specimen: BLOOD  Result Value Ref Range Status   Specimen Description BLOOD RIGHT HAND  Final    Special Requests   Final    BOTTLES DRAWN AEROBIC AND ANAEROBIC Blood Culture adequate volume   Culture   Final    NO GROWTH 5 DAYS Performed at [facility], [address] [place], [place] [address]    Report Status 06/13/2021 FINAL  Final     Radiology Studies: No results found.  Scheduled Meds:  alfuzosin  10 mg Oral Q breakfast   aspirin EC  81 mg Oral Daily   colchicine  0.3 mg Oral  Daily   finasteride  5 mg Oral Daily   heparin  5,000 Units Subcutaneous Q8H   ipratropium-albuterol  3 mL Nebulization TID   mometasone-formoterol  2 puff Inhalation BID   nicotine  21 mg Transdermal Daily   [START ON 06/15/2021] pantoprazole  20 mg Oral Daily   predniSONE  40 mg Oral Q breakfast   simvastatin  10 mg Oral QHS   verapamil  240 mg Oral BID   Continuous Infusions:     LOS: 6 days    Enzo Bi, MD [facility]  If 7PM-7AM, please contact night-coverage [email or web address]  06/14/2021, 4:08 PM

## 2021-06-15 LAB — MAGNESIUM: Magnesium: 2 mg/dL (ref 1.7–2.4)

## 2021-06-15 LAB — BASIC METABOLIC PANEL
Anion gap: 4 — ABNORMAL LOW (ref 5–15)
BUN: 77 mg/dL — ABNORMAL HIGH (ref 8–23)
CO2: 20 mmol/L — ABNORMAL LOW (ref 22–32)
Calcium: 8.7 mg/dL — ABNORMAL LOW (ref 8.9–10.3)
Chloride: 114 mmol/L — ABNORMAL HIGH (ref 98–111)
Creatinine, Ser: 1.88 mg/dL — ABNORMAL HIGH (ref 0.61–1.24)
GFR, Estimated: 34 mL/min — ABNORMAL LOW (ref >60)
Glucose, Bld: 111 mg/dL — ABNORMAL HIGH (ref 70–99)
Potassium: 5.3 mmol/L — ABNORMAL HIGH (ref 3.5–5.1)
Sodium: 138 mmol/L (ref 135–145)

## 2021-06-15 LAB — CBC
HCT: 35 % — ABNORMAL LOW (ref 39.0–52.0)
Hemoglobin: 12 g/dL — ABNORMAL LOW (ref 13.0–17.0)
MCH: 30.3 pg (ref 26.0–34.0)
MCHC: 34.3 g/dL (ref 30.0–36.0)
MCV: 88.4 fL (ref 80.0–100.0)
Platelets: 313 10*3/uL (ref 150–400)
RBC: 3.96 MIL/uL — ABNORMAL LOW (ref 4.22–5.81)
RDW: 15.3 % (ref 11.5–15.5)
WBC: 24.6 10*3/uL — ABNORMAL HIGH (ref 4.0–10.5)
nRBC: 0 % (ref 0.0–0.2)

## 2021-06-15 MED ORDER — SODIUM POLYSTYRENE SULFONATE 15 GM/60ML PO SUSP
15.0000 g | Freq: Once | ORAL | Status: AC
  Administered 2021-06-15: 15 g via ORAL
  Filled 2021-06-15: qty 60

## 2021-06-15 MED ORDER — IPRATROPIUM-ALBUTEROL 0.5-2.5 (3) MG/3ML IN SOLN: 3.0000 mL | Freq: Four times a day (QID) | RESPIRATORY_TRACT | Status: DC | PRN

## 2021-06-15 MED ORDER — SODIUM POLYSTYRENE SULFONATE 15 GM/60ML PO SUSP
15.0000 g | Freq: Once | ORAL | Status: AC
  Administered 2021-06-15: 14:00:00 15 g via ORAL
  Filled 2021-06-15: qty 60

## 2021-06-15 NOTE — Progress Notes (Signed)
PROGRESS NOTE    George [name]  EPP:[id number] DOB: 09-Nov-1931 DOA: 06/08/2021 PCP: Juluis Pitch, MD   Brief Narrative: Taken from H&P.   Subjective: Doing well today.  No complaints.  Communicated with pt via writing on the board.   Assessment & Plan:   Principal Problem:   CAP (community acquired pneumonia) Active Problems:   Tobacco abuse   CAD (coronary artery disease)   Hyperlipidemia   Hypertension   Type II diabetes mellitus with renal manifestations (HCC)   Acute kidney injury superimposed on CKD IIIb (HCC)   BPH with obstruction/lower urinary tract symptoms   Hyperkalemia   Acute on chronic respiratory failure with hypoxia (HCC)   Gout   Sepsis ([place])   COPD exacerbation (HCC)  Sepsis secondary to community-acquired pneumonia and COPD exacerbation acute hypoxic respiratory failure.  Patient met sepsis criteria with fever, leukocytosis, tachycardia and tachypnea.  Endorgan damage with AKI.  Hypotensive which improved with IV fluid.  Imaging concerning for right lung pneumonia.Strep pneumonia negative.  Blood cultures negative so far.  Procalcitonin elevated at 13.20.  He was started on ceftriaxone and Zithromax. -Based on the location of infiltrate-advanced age and mild underlying cognitive impairment he is high risk for aspiration.  Unable to answer any questions regarding coughing while eating.  Unable to reach the family. -Swallow evaluation found no significant aspiration --completed a course of IV abx with azithromycin and ceftriaxone. --weaned down to RA today. Plan: --cont prednisone 20 mg daily, with taper --DuoNeb q4h while awake --Dulera  AKI with CKD stage IIIb.   Baseline creatinine appears to be around 1.5.  It was 4.83 on admission.  Nephrology was consulted.  Creatinine slowly improving with IV fluid, but had ups and downs. --NS'@35' , per nephrology, d/c'ed on 9/2 Plan: --encourage oral hydration --cont to hold Lisinopril  CAD (coronary  artery disease):  Currently no chest pain, troponin level 18, 14 --cont ASA and statin   Hyperlipidemia --cont statin   Hypertension.   Blood pressure within goal.  Antihypertensives initially held due to softer blood pressure. --cont home verapamil --cont to hold Lisinopril  Tobacco abuse --cont nicotine patch  Diet-controlled diabetes, Type II diabetes mellitus with renal manifestations [facility]):  Recent A1c 5.1, well controlled.  Blood sugar within goal. --no need for BG checks.  BPH with obstruction/lower urinary tract symptoms --cont alfuzosin and proscar   Hyperkalemia:  -s/p Leukoma 10 g --s/p V[place]assa x1 on 9/4, K+ still 5.3 today --Kayexalate x1 today   Gout -cont Colchicine-dose adjusted according to the renal function.   Objective: Vitals:   06/15/21 0501 06/15/21 0712 06/15/21 0801 06/15/21 1100  BP: (!) 160/75  132/76 (!) 142/81  Pulse: (!) 101  86 91  Resp: '18  18 18  ' Temp: 97.8 F (36.6 C)  98.1 F (36.7 C) 98.1 F (36.7 C)  TempSrc:    Oral  SpO2: 92% 94% 93% 93%  Weight:      Height:        Intake/Output Summary (Last 24 hours) at 06/15/2021 1535 Last data filed at 06/15/2021 0842 Gross per 24 hour  Intake 480 ml  Output 300 ml  Net 180 ml   Filed Weights   06/10/21 0900 06/11/21 0348 06/12/21 0500  Weight: 81 kg 80.7 kg 81.6 kg    Examination:  Constitutional: NAD, AAOx3 HEENT: conjunctivae and lids normal, EOMI, practically deaf CV: No cyanosis.   RESP: normal respiratory effort, on RA Extremities: No effusions, edema in BLE SKIN: warm, dry Neuro:  II - XII grossly intact.   Psych: Normal mood and affect.     DVT prophylaxis: Subcu heparin Code Status: DNR Family Communication: Updated friend at bedside today.   Pt is estranged from his son.  Only contacts are friends.    Status is: Inpatient  Dispo:   The patient is from: home Anticipated d/c is to: SNF Anticipated d/c date is: tomorrow  Patient currently is stable to  d/c.   All the records are reviewed and case discussed with Care Management/Social Worker. Management plans discussed with the patient, nursing and they are in agreement.  Consultants:  Nephrology  Procedures:  Antimicrobials:  Unasyn.  Data Reviewed: I have personally reviewed following labs and imaging studies  CBC: Recent Labs  Lab 06/11/21 0417 06/12/21 0433 06/13/21 0443 06/14/21 0455 06/15/21 0453  WBC 14.8* 17.3* 18.5* 19.5* 24.6*  HGB 12.3* 12.8* 12.8* 12.2* 12.0*  HCT 35.7* 37.3* 36.1* 35.1* 35.0*  MCV 86.2 85.6 87.2 85.6 88.4  PLT 252 292 291 290 967   Basic Metabolic Panel: Recent Labs  Lab 06/10/21 0536 06/11/21 0417 06/12/21 0433 06/13/21 0443 06/14/21 0455 06/15/21 0453  NA 139 137 139 139 140 138  K 3.8 4.9 5.0 5.4* 5.6* 5.3*  CL 98 108 112* 112* 117* 114*  CO2 33* 18* 20* 20* 18* 20*  GLUCOSE 151* 187* 103* 99 105* 111*  BUN 31* 104* 92* 92* 82* 77*  CREATININE 1.90* 2.61* 2.25* 1.97* 2.03* 1.88*  CALCIUM 8.6* 8.2* 8.3* 8.6* 8.8* 8.7*  MG  --  2.1 2.3 2.2 2.2 2.0  PHOS 3.9  --   --   --   --   --    GFR: Estimated Creatinine Clearance: 27.8 mL/min (A) (by C-G formula based on SCr of 1.88 mg/dL (H)). Liver Function Tests: Recent Labs  Lab 06/10/21 0536  ALBUMIN 2.8*   No results for input(s): LIPASE, AMYLASE in the last 168 hours. No results for input(s): AMMONIA in the last 168 hours. Coagulation Profile: No results for input(s): INR, PROTIME in the last 168 hours. Cardiac Enzymes: No results for input(s): CKTOTAL, CKMB, CKMBINDEX, TROPONINI in the last 168 hours. BNP (last 3 results) No results for input(s): PROBNP in the last 8760 hours. HbA1C: No results for input(s): HGBA1C in the last 72 hours. CBG: Recent Labs  Lab 06/09/21 1644  GLUCAP 119*   Lipid Profile: No results for input(s): CHOL, HDL, LDLCALC, TRIG, CHOLHDL, LDLDIRECT in the last 72 hours. Thyroid Function Tests: No results for input(s): TSH, T4TOTAL, FREET4,  T3FREE, THYROIDAB in the last 72 hours. Anemia Panel: No results for input(s): VITAMINB12, FOLATE, FERRITIN, TIBC, IRON, RETICCTPCT in the last 72 hours. Sepsis Labs: No results for input(s): PROCALCITON, LATICACIDVEN in the last 168 hours.   Recent Results (from the past 240 hour(s))  Resp Panel by RT-PCR (Flu A&B, Covid) Nasopharyngeal Swab     Status: None   Collection Time: 06/08/21  2:22 AM   Specimen: Nasopharyngeal Swab; Nasopharyngeal(NP) swabs in vial transport medium  Result Value Ref Range Status   SARS Coronavirus 2 by RT PCR NEGATIVE NEGATIVE Final    Comment: (NOTE) SARS-CoV-2 target nucleic acids are NOT DETECTED.  The SARS-CoV-2 RNA is generally detectable in upper respiratory specimens during the acute phase of infection. The lowest concentration of SARS-CoV-2 viral copies this assay can detect is 138 copies/mL. A negative result does not preclude SARS-Cov-2 infection and should not be used as the sole basis for treatment or other patient management decisions.  A negative result may occur with  improper specimen collection/handling, submission of specimen other than nasopharyngeal swab, presence of viral mutation(s) within the areas targeted by this assay, and inadequate number of viral copies(<138 copies/mL). A negative result must be combined with clinical observations, patient history, and epidemiological information. The expected result is Negative.  Fact Sheet for Patients:  EntrepreneurPulse.com.au  Fact Sheet for Healthcare Providers:  IncredibleEmployment.be  This test is no t yet approved or cleared by the [place] FDA and  has been authorized for detection and/or diagnosis of SARS-CoV-2 by FDA under an Emergency Use Authorization (EUA). This EUA will remain  in effect (meaning this test can be used) for the duration of the COVID-19 declaration under Section 564(b)(1) of the Act, 21 U.S.C.section 360bbb-3(b)(1),  unless the authorization is terminated  or revoked sooner.       Influenza A by PCR NEGATIVE NEGATIVE Final   Influenza B by PCR NEGATIVE NEGATIVE Final    Comment: (NOTE) The Xpert Xpress SARS-CoV-2/FLU/RSV plus assay is intended as an aid in the diagnosis of influenza from Nasopharyngeal swab specimens and should not be used as a sole basis for treatment. Nasal washings and aspirates are unacceptable for Xpert Xpress SARS-CoV-2/FLU/RSV testing.  Fact Sheet for Patients: EntrepreneurPulse.com.au  Fact Sheet for Healthcare Providers: IncredibleEmployment.be  This test is not yet approved or cleared by the [place] FDA and has been authorized for detection and/or diagnosis of SARS-CoV-2 by FDA under an Emergency Use Authorization (EUA). This EUA will remain in effect (meaning this test can be used) for the duration of the COVID-19 declaration under Section 564(b)(1) of the Act, 21 U.S.C. section 360bbb-3(b)(1), unless the authorization is terminated or revoked.  Performed at [facility], [address] [place], [place] [address]   Blood Culture (routine x 2)     Status: None   Collection Time: 06/08/21  3:50 AM   Specimen: BLOOD  Result Value Ref Range Status   Specimen Description BLOOD RIGHT [facility]  Final   Special Requests   Final    BOTTLES DRAWN AEROBIC AND ANAEROBIC Blood Culture adequate volume   Culture   Final    NO GROWTH 5 DAYS Performed at [facility], [address] [place], [place] [address]    Report Status 06/13/2021 FINAL  Final  Blood Culture (routine x 2)     Status: None   Collection Time: 06/08/21  3:58 AM   Specimen: BLOOD  Result Value Ref Range Status   Specimen Description BLOOD RIGHT HAND  Final   Special Requests   Final    BOTTLES DRAWN AEROBIC AND ANAEROBIC Blood Culture adequate volume   Culture   Final    NO GROWTH 5 DAYS Performed at [facility], [address], [place], [place] [address]    Report Status 06/13/2021 FINAL  Final     Radiology Studies: No results found.  Scheduled Meds:  alfuzosin  10 mg Oral Q breakfast   aspirin EC  81 mg Oral Daily   colchicine  0.3 mg Oral Daily   finasteride  5 mg Oral Daily   heparin  5,000 Units Subcutaneous Q8H   mometasone-formoterol  2 puff Inhalation BID   nicotine  21 mg Transdermal Daily   pantoprazole  40 mg Oral Daily   predniSONE  20 mg Oral Q breakfast   simvastatin  10 mg Oral QHS   verapamil  240 mg Oral BID   Continuous Infusions:  LOS: 7 days    Enzo Bi, MD [facility]  If 7PM-7AM, please contact night-coverage [email or web address]  06/15/2021, 3:35 PM

## 2021-06-15 NOTE — Plan of Care (Signed)
p 

## 2021-06-15 NOTE — Progress Notes (Signed)
Occupational Therapy Treatment Patient Details Name: George [name] MRN: [id number] DOB: 06-Apr-1932 Today's Date: 06/15/2021    History of present illness Pt is an 87 y.o. male with medical history significant of CKD-IIIb, hypertension, hyperlipidemia, diabetes mellitus, COPD, GERD, gout, OSA, CAD, stent placement, colon cancer, BPH, anemia, tobacco abuse, and per patient L TSA April 2022 who presented with shortness of breath. MD assessment includes: Sepsis secondary to community-acquired pneumonia and COPD exacerbation/acute hypoxic respiratory failure, AKI, and hyperkalemia.   OT comments  Pt seen for OT treatment on this date. Upon arrival pt curled up in reclinder and on room air.Denies any pain and willing to work with OT  Done this date sit<> stand , marching in place, LB dressing and toilet transfer and hygiene. Pt this date independent with supervision - and CG with toilet transfer and hygiene- pt very pleasant, motivated and surprise that he did so well. O2 sats did decrease with standing activities to 88% and had to take several rest breaks and purse lip breathing - and O2 sats increase to 94%. Fatiguing quickly but improve to standing for 20 Sec compare to prior session. Was able to perform seated LB dressing  Pt continues to benefit from skilled OT services to maximize return to PLOF and minimize risk of future falls, injury, caregiver burden, and readmission. Will continue to follow POC. Discharge recommendation remains appropriate.      Follow Up Recommendations       Equipment Recommendations       Recommendations for Other Services      Precautions / Restrictions Precautions Precautions: Fall Restrictions Weight Bearing Restrictions: No       Mobility Bed Mobility                    Transfers                      Balance                                           ADL either performed or assessed with clinical judgement   ADL                                                Vision       Perception     Praxis      Cognition                                                Exercises Other Exercises Other Exercises: Sitting in chair- sit<> stand ind with supervistion this date- done 5 x and marching 10 to 20 sec using walker - O2 sats drop to 88 but with rest break and purse lip breathing- increase to 94 % Other Exercises: Sit <> stand 8 reps- ind with supervision - safety for hand place ment and backing and feeling chair behind legs Other Exercises: Transfer from chair to commode using RW with supervision -CG -  toilet hygiene ind with setup and supervisition this date Other Exercises: In sitting independent in doff and donn sock   Shoulder Instructions  General Comments      Pertinent Vitals/ Pain       Pain Assessment: No/denies pain  Home Living Family/patient expects to be discharged to:: Private residence Living Arrangements: Alone Available Help at Discharge: Personal care attendant;Available PRN/intermittently Type of Home: House Home Access: Stairs to enter     Home Layout: Able to live on main level with bedroom/bathroom     Bathroom Shower/Tub: Tub/shower unit         Home Equipment: Cane - single point;Walker - 2 wheels;Bedside commode;Tub bench          Prior Functioning/Environment Level of Independence: Needs assistance            Frequency           Progress Toward Goals  OT Goals(current goals can now be found in the care plan section)     Acute Rehab OT Goals Patient Stated Goal: To be stronger OT Goal Formulation: With patient Time For Goal Achievement: 06/23/21 Potential to Achieve Goals: Good  Plan      Co-evaluation                 AM-PAC OT "6 Clicks" Daily Activity     Outcome Measure                    End of Session        Activity Tolerance     Patient Left     Nurse  Communication          Time: 1010-1040 OT Time Calculation (min): 30 min  Charges: OT General Charges $OT Visit: 1 Visit OT Treatments $Self Care/Home Management : 8-22 mins $Therapeutic Activity: 8-22 mins    Wynonna Fitzhenry OTR/L,CLT 06/15/2021, 1:09 PM

## 2021-06-15 NOTE — TOC Progression Note (Signed)
Transition of Care [facility]) - Progression Note    Patient Details  Name: George [name] MRN: [id number] Date of Birth: Jun 11, 1932  Transition of [facility] CM/SW Contact  Shelbie Hutching, RN Phone Number: 06/15/2021, 12:57 PM  Clinical Narrative:    2 bed offers presented to patient, [facility] in [facility] and [facility].  Patient chooses  as he wants to stay in [place].  [facility] reports that they can accept patient tomorrow, not accepting admissions today.  MD will order repeat COVID and RNCM has started insurance auth through [place].     Expected Discharge Plan: [place] Barriers to Discharge: Continued Medical Work up  Expected Discharge Plan and Services Expected Discharge Plan: [place]   Discharge Planning Services: CM Consult Post Acute Care Choice: [place] Living arrangements for the past 2 months: Single Family Home                 DME Arranged: N/A DME Agency: [facility]       HH Arranged: PT, OT, RN, Nurse's Aide, Social Work [employer] Agency: [place] ([place]) Date [place]: 06/11/21 Time [place]: [place] Representative spoke with at [place]: [place] ([place]) Interventions    Readmission Risk Interventions No flowsheet data found.

## 2021-06-15 NOTE — Progress Notes (Signed)
[facility]  ROUNDING NOTE   Subjective:   George [name] is a 87 y.o. male who presents to ED via EMS for chest pain. Chest x-ray showed right lower lobe pneumonia. We were consulted to evaluate acute kidney injury.  Patient sitting up in chair. Overall feeling well at the moment.  Creatinine 1.88 (2.03) (1.97) (2.25) (2.61) (1.90) (4.07) (4.57) Measured urine output yesterday was 800 cc.  Objective:  Vital signs in last 24 hours:  Temp:  [97.5 F (36.4 C)-98.3 F (36.8 C)] 98.1 F (36.7 C) (09/05 1100) Pulse Rate:  [86-108] 91 (09/05 1100) Resp:  [17-20] 18 (09/05 1100) BP: (132-160)/(70-83) 142/81 (09/05 1100) SpO2:  [92 %-96 %] 93 % (09/05 1100)  Weight change:  Filed Weights   06/10/21 0900 06/11/21 0348 06/12/21 0500  Weight: 81 kg 80.7 kg 81.6 kg    Intake/Output: I/O last 3 completed shifts: In: -  Out: 2510 [Urine:2510]   Intake/Output this shift:  Total I/O In: 480 [P.O.:480] Out: -   Physical Exam: General: NAD, sitting in chair  Head: Normocephalic, atraumatic. Moist oral mucosal membranes  Eyes: Anicteric  Lungs:  Clear bilaterally, normal effort, O2 2L  Heart: Regular rate and rhythm  Abdomen:  Soft, nontender  Extremities: no peripheral edema.  Neurologic: Nonfocal, moving all four extremities  Skin: No lesions       Basic Metabolic Panel: Recent Labs  Lab 06/10/21 0536 06/11/21 0417 06/12/21 0433 06/13/21 0443 06/14/21 0455 06/15/21 0453  NA 139 137 139 139 140 138  K 3.8 4.9 5.0 5.4* 5.6* 5.3*  CL 98 108 112* 112* 117* 114*  CO2 33* 18* 20* 20* 18* 20*  GLUCOSE 151* 187* 103* 99 105* 111*  BUN 31* 104* 92* 92* 82* 77*  CREATININE 1.90* 2.61* 2.25* 1.97* 2.03* 1.88*  CALCIUM 8.6* 8.2* 8.3* 8.6* 8.8* 8.7*  MG  --  2.1 2.3 2.2 2.2 2.0  PHOS 3.9  --   --   --   --   --      Liver Function Tests: Recent Labs  Lab 06/10/21 0536  ALBUMIN 2.8*    No results for input(s): LIPASE, AMYLASE in the last 168  hours. No results for input(s): AMMONIA in the last 168 hours.  CBC: Recent Labs  Lab 06/11/21 0417 06/12/21 0433 06/13/21 0443 06/14/21 0455 06/15/21 0453  WBC 14.8* 17.3* 18.5* 19.5* 24.6*  HGB 12.3* 12.8* 12.8* 12.2* 12.0*  HCT 35.7* 37.3* 36.1* 35.1* 35.0*  MCV 86.2 85.6 87.2 85.6 88.4  PLT 252 292 291 290 313     Cardiac Enzymes: No results for input(s): CKTOTAL, CKMB, CKMBINDEX, TROPONINI in the last 168 hours.  BNP: Invalid input(s): POCBNP  CBG: Recent Labs  Lab 06/09/21 1644  GLUCAP 119*     Microbiology: Results for orders placed or performed during the hospital encounter of 06/08/21  Resp Panel by RT-PCR (Flu A&B, Covid) Nasopharyngeal Swab     Status: None   Collection Time: 06/08/21  2:22 AM   Specimen: Nasopharyngeal Swab; Nasopharyngeal(NP) swabs in vial transport medium  Result Value Ref Range Status   SARS Coronavirus 2 by RT PCR NEGATIVE NEGATIVE Final    Comment: (NOTE) SARS-CoV-2 target nucleic acids are NOT DETECTED.  The SARS-CoV-2 RNA is generally detectable in upper respiratory specimens during the acute phase of infection. The lowest concentration of SARS-CoV-2 viral copies this assay can detect is 138 copies/mL. A negative result does not preclude SARS-Cov-2 infection and should not be used as  the sole basis for treatment or other patient management decisions. A negative result may occur with  improper specimen collection/handling, submission of specimen other than nasopharyngeal swab, presence of viral mutation(s) within the areas targeted by this assay, and inadequate number of viral copies(<138 copies/mL). A negative result must be combined with clinical observations, patient history, and epidemiological information. The expected result is Negative.  Fact Sheet for Patients:  EntrepreneurPulse.com.au  Fact Sheet for Healthcare Providers:  IncredibleEmployment.be  This test is no t yet  approved or cleared by the [place] FDA and  has been authorized for detection and/or diagnosis of SARS-CoV-2 by FDA under an Emergency Use Authorization (EUA). This EUA will remain  in effect (meaning this test can be used) for the duration of the COVID-19 declaration under Section 564(b)(1) of the Act, 21 U.S.C.section 360bbb-3(b)(1), unless the authorization is terminated  or revoked sooner.       Influenza A by PCR NEGATIVE NEGATIVE Final   Influenza B by PCR NEGATIVE NEGATIVE Final    Comment: (NOTE) The Xpert Xpress SARS-CoV-2/FLU/RSV plus assay is intended as an aid in the diagnosis of influenza from Nasopharyngeal swab specimens and should not be used as a sole basis for treatment. Nasal washings and aspirates are unacceptable for Xpert Xpress SARS-CoV-2/FLU/RSV testing.  Fact Sheet for Patients: EntrepreneurPulse.com.au  Fact Sheet for Healthcare Providers: IncredibleEmployment.be  This test is not yet approved or cleared by the [place] FDA and has been authorized for detection and/or diagnosis of SARS-CoV-2 by FDA under an Emergency Use Authorization (EUA). This EUA will remain in effect (meaning this test can be used) for the duration of the COVID-19 declaration under Section 564(b)(1) of the Act, 21 U.S.C. section 360bbb-3(b)(1), unless the authorization is terminated or revoked.  Performed at [facility], [address] [place], [place] [address]   Blood Culture (routine x 2)     Status: None   Collection Time: 06/08/21  3:50 AM   Specimen: BLOOD  Result Value Ref Range Status   Specimen Description BLOOD RIGHT [facility]  Final   Special Requests   Final    BOTTLES DRAWN AEROBIC AND ANAEROBIC Blood Culture adequate volume   Culture   Final    NO GROWTH 5 DAYS Performed at [facility], [address], [place], [place] [address]    Report Status 06/13/2021 FINAL  Final  Blood Culture (routine x 2)      Status: None   Collection Time: 06/08/21  3:58 AM   Specimen: BLOOD  Result Value Ref Range Status   Specimen Description BLOOD RIGHT HAND  Final   Special Requests   Final    BOTTLES DRAWN AEROBIC AND ANAEROBIC Blood Culture adequate volume   Culture   Final    NO GROWTH 5 DAYS Performed at [facility], [address], [place],  [address]    Report Status 06/13/2021 FINAL  Final    Coagulation Studies: No results for input(s): LABPROT, INR in the last 72 hours.  Urinalysis: No results for input(s): COLORURINE, LABSPEC, PHURINE, GLUCOSEU, HGBUR, BILIRUBINUR, KETONESUR, PROTEINUR, UROBILINOGEN, NITRITE, LEUKOCYTESUR in the last 72 hours.  Invalid input(s): APPERANCEUR     Imaging: No results found.   Medications:      alfuzosin  10 mg Oral Q breakfast   aspirin EC  81 mg Oral Daily   colchicine  0.3 mg Oral Daily   finasteride  5 mg Oral Daily   heparin  5,000 Units Subcutaneous Q8H  mometasone-formoterol  2 puff Inhalation BID   nicotine  21 mg Transdermal Daily   pantoprazole  40 mg Oral Daily   predniSONE  20 mg Oral Q breakfast   simvastatin  10 mg Oral QHS   sodium polystyrene  15 g Oral Once   verapamil  240 mg Oral BID   acetaminophen, albuterol, dextromethorphan-guaiFENesin, hydrALAZINE, hyoscyamine, ipratropium-albuterol, LORazepam, ondansetron (ZOFRAN) IV  Assessment/ Plan:  Mr. George [name] is a 87 y.o.  male ho presents to ED via EMS for chest pain. Chest x-ray showed right lower lobe pneumonia. We were consulted to evaluate acute kidney injury.  Acute Kidney Injury on chronic kidney disease stage 3b with baseline creatinine 1.51 and GFR of 44 on 02/12/21.  Acute kidney injury secondary to ATN from ongoing sepsis Renal US has echogenic kidneys, enlarged prostate and no obstruction. UA negative for protein and blood. Renal function slightly improved as creatinine down to 1.8.  BUN down to 77.  Avoid nephrotoxins as  possible.  Lab Results  Component Value Date   CREATININE 1.88 (H) 06/15/2021   CREATININE 2.03 (H) 06/14/2021   CREATININE 1.97 (H) 06/13/2021    Intake/Output Summary (Last 24 hours) at 06/15/2021 1404 Last data filed at 06/15/2021 0842 Gross per 24 hour  Intake 480 ml  Output 300 ml  Net 180 ml    2. Hyperkalemia  Potassium was down to 5.3.  Sodium polystyrene ordered today.  3.Acute on chronic resp failure due to sepsis Underlying severe emphysema, RLL pneumonia seen on X-ray Completed antibiotic regimen    LOS: 7 Zack Crager 9/5/20222:04 PM

## 2021-06-15 NOTE — Plan of Care (Signed)

## 2021-06-16 LAB — CBC
HCT: 31.9 % — ABNORMAL LOW (ref 39.0–52.0)
Hemoglobin: 11.4 g/dL — ABNORMAL LOW (ref 13.0–17.0)
MCH: 30.8 pg (ref 26.0–34.0)
MCHC: 35.7 g/dL (ref 30.0–36.0)
MCV: 86.2 fL (ref 80.0–100.0)
Platelets: 279 10*3/uL (ref 150–400)
RBC: 3.7 MIL/uL — ABNORMAL LOW (ref 4.22–5.81)
RDW: 15.4 % (ref 11.5–15.5)
WBC: 16.7 10*3/uL — ABNORMAL HIGH (ref 4.0–10.5)
nRBC: 0 % (ref 0.0–0.2)

## 2021-06-16 LAB — BASIC METABOLIC PANEL
Anion gap: 6 (ref 5–15)
BUN: 69 mg/dL — ABNORMAL HIGH (ref 8–23)
CO2: 21 mmol/L — ABNORMAL LOW (ref 22–32)
Calcium: 8.2 mg/dL — ABNORMAL LOW (ref 8.9–10.3)
Chloride: 112 mmol/L — ABNORMAL HIGH (ref 98–111)
Creatinine, Ser: 1.8 mg/dL — ABNORMAL HIGH (ref 0.61–1.24)
GFR, Estimated: 36 mL/min — ABNORMAL LOW (ref >60)
Glucose, Bld: 89 mg/dL (ref 70–99)
Potassium: 4.9 mmol/L (ref 3.5–5.1)
Sodium: 139 mmol/L (ref 135–145)

## 2021-06-16 LAB — MAGNESIUM: Magnesium: 1.7 mg/dL (ref 1.7–2.4)

## 2021-06-16 LAB — SARS CORONAVIRUS 2 (TAT 6-24 HRS): SARS Coronavirus 2: NEGATIVE

## 2021-06-16 MED ORDER — PREDNISONE 10 MG PO TABS: 10.0000 mg | ORAL_TABLET | Freq: Every day | ORAL | 0 refills | Status: AC

## 2021-06-16 MED ORDER — COLCHICINE 0.6 MG PO TABS: 0.3000 mg | ORAL_TABLET | Freq: Every day | ORAL | Status: DC

## 2021-06-16 MED ORDER — NICOTINE 21 MG/24HR TD PT24: 21.0000 mg | MEDICATED_PATCH | Freq: Every day | TRANSDERMAL | 0 refills | Status: DC

## 2021-06-16 NOTE — Care Management Important Message (Signed)
Important Message  Patient Details  Name: George [name] MRN: [id number] Date of Birth: May 17, 1932   Medicare Important Message Given:  Yes  Patient is in an isolation room so I called his room [phone]) and reviewed the Important Message from Medicare. He is very hard of hearing so talked louder and clear as possible. He said he is in agreement with his discharge and I thanked him for his time.  [place] 06/16/2021, 12:39 PM

## 2021-06-16 NOTE — Discharge Summary (Signed)
Physician Discharge Summary   George [name]  male DOB: 09/17/1932  [id number]  PCP: Juluis Pitch, MD  Admit date: 06/08/2021 Discharge date: 06/16/2021  Admitted From: home Disposition:  SNF CODE STATUS: DNR Pt is estranged from his son.  Only contacts are friends.    Hospital Course:  For full details, please see H&P, progress notes, consult notes and ancillary notes.  Briefly,  George [name] is a 87 y.o. male with medical history significant of CKD-IIIb, hypertension, diabetes mellitus, COPD, gout, OSA, CAD, stent placement, colon cancer, BPH, anemia, tobacco abuse, who presented with shortness of breath.  Sepsis secondary to community-acquired pneumonia and COPD exacerbation acute hypoxic respiratory failure.  Patient met sepsis criteria with fever, leukocytosis, tachycardia and tachypnea.  End-organ damage with AKI.  Hypotensive which improved with IV fluid.  Imaging concerning for right lung pneumonia.  Strep pneumonia negative.  Blood cultures negative.  Procalcitonin elevated at 13.20.   --He was found to have severe respiratory distress, with O2 desaturated to 73% on room air, and BiPAP is started in the ED, which was then transitioned to 6L [place].  Pt was weaned down to RA several days prior to discharge. --He was started on ceftriaxone and Zithromax and finished a course of IV abx. -Swallow evaluation found no significant aspiration --Pt was also treated for COPD exacerbation with steroid (solumedrol to prednisone) and DuoNeb scheduled.  Pt will finish 2 more days of prednisone 10 mg daily to finish the taper.  AKI with CKD stage IIIb.   Baseline creatinine appears to be around 1.5.  It was 4.83 on admission.  Nephrology was consulted.  Creatinine slowly improving with IV fluid, but had ups and downs.  Cr improved to 1.8 on the day of discharge.   --home Lisinopril held during hospitalization, to be resumed after discharge.  CAD (coronary artery disease):  Currently  no chest pain, troponin level 18, 14 --cont ASA and statin   Hyperlipidemia --cont statin   Hypertension.   Blood pressure within goal.  Antihypertensives initially held due to softer blood pressure. --cont home verapamil --home Lisinopril held during hospitalization, to be resumed after discharge.   Tobacco abuse --cont nicotine patch  Diet-controlled diabetes, Type II diabetes mellitus with renal manifestations [facility]):  Recent A1c 5.1, well controlled.  Blood sugar within goal. --no need for BG checks.   BPH with obstruction/lower urinary tract symptoms --cont alfuzosin and proscar   Hyperkalemia, resolved --s/p Leukoma 10 g --s/p Veltassa x1 on 9/4 --s/p Kayexalate 15g x2 on 9/5   Gout --cont Colchicine, dose adjusted lower according to the renal function.   Discharge Diagnoses:  Principal Problem:   CAP (community acquired pneumonia) Active Problems:   Tobacco abuse   CAD (coronary artery disease)   Hyperlipidemia   Hypertension   Type II diabetes mellitus with renal manifestations (HCC)   Acute kidney injury superimposed on CKD IIIb (HCC)   BPH with obstruction/lower urinary tract symptoms   Hyperkalemia   Acute on chronic respiratory failure with hypoxia (HCC)   Gout   Sepsis ([place])   COPD exacerbation ([place])   30 Day Unplanned Readmission Risk Score    Flowsheet Row ED to Hosp-Admission (Current) from 06/08/2021 in [place] (1C)  30 Day Unplanned Readmission Risk Score (%) 19.89 Filed at 06/16/2021 0801       This score is the patient's risk of an unplanned readmission within 30 days of being discharged (0 -100%). The score is based on  dignosis, age, lab data, medications, orders, and past utilization.   Low:  0-14.9   Medium: 15-21.9   High: 22-29.9   Extreme: 30 and above         Discharge Instructions:  Allergies as of 06/16/2021   No Known Allergies      Medication List     STOP taking these medications     doxazosin 4 MG tablet Commonly known as: CARDURA   fluticasone-salmeterol 250-50 MCG/ACT Aepb Commonly known as: ADVAIR   ibuprofen 200 MG tablet Commonly known as: ADVIL   sildenafil 20 MG tablet Commonly known as: REVATIO       TAKE these medications    albuterol 108 (90 Base) MCG/ACT inhaler Commonly known as: VENTOLIN HFA Inhale 1-2 puffs into the lungs every 6 (six) hours as needed for wheezing or shortness of breath.   alfuzosin 10 MG 24 hr tablet Commonly known as: UROXATRAL Take 1 tablet (10 mg total) by mouth daily with breakfast. This replaces doxazosin   aspirin EC 81 MG tablet Take 81 mg by mouth daily. Swallow whole.   colchicine 0.6 MG tablet Take 0.5 tablets (0.3 mg total) by mouth daily. Start taking on: June 17, 2021 What changed:  how much to take when to take this   cyanocobalamin 1000 MCG/ML injection Commonly known as: (VITAMIN B-12) Inject 1,000 mcg into the muscle every 30 (thirty) days.   finasteride 5 MG tablet Commonly known as: PROSCAR Take 1 tablet (5 mg total) by mouth daily.   hyoscyamine 0.125 MG tablet Commonly known as: LEVSIN Take 0.125 mg by mouth every 4 (four) hours as needed.   lisinopril 20 MG tablet Commonly known as: ZESTRIL Take 20 mg by mouth 2 (two) times daily.   nicotine 21 mg/24hr patch Commonly known as: NICODERM CQ - dosed in mg/24 hours Place 1 patch (21 mg total) onto the skin daily.   omeprazole 20 MG capsule Commonly known as: PRILOSEC Take 20 mg by mouth daily.   predniSONE 10 MG tablet Commonly known as: DELTASONE Take 1 tablet (10 mg total) by mouth daily with breakfast for 2 days. Start taking on: June 17, 2021   simvastatin 20 MG tablet Commonly known as: ZOCOR Take 10 mg by mouth at bedtime.   testosterone cypionate 200 MG/ML injection Commonly known as: DEPOTESTOSTERONE CYPIONATE Inject 200 mg into the muscle every 14 (fourteen) days.   Trelegy Ellipta 100-62.5-25 MCG/INH  Aepb Generic drug: Fluticasone-Umeclidin-Vilant Inhale 1 puff into the lungs daily.   verapamil 240 MG CR tablet Commonly known as: CALAN-SR Take 240 mg by mouth 2 (two) times daily.         Follow-up Information     Juluis Pitch, MD Follow up.   Specialty: Family Medicine Contact information: [address] [address] [phone]         Anthonette Legato, MD Follow up.   Specialty: Nephrology Why: As needed Contact information: [address] [place] [address] [phone]                 No Known Allergies   The results of significant diagnostics from this hospitalization (including imaging, microbiology, ancillary and laboratory) are listed below for reference.   Consultations:   Procedures/Studies: US RENAL  Result Date: 06/08/2021 CLINICAL DATA:  Acute kidney injury EXAM: RENAL / URINARY TRACT ULTRASOUND COMPLETE COMPARISON:  Renal stone protocol CT 11/04/2020 FINDINGS: Right Kidney: Renal measurements: 10.4 x 4.9 x 5.3 cm = volume: 142  mL. Mild diffuse increased echogenicity of the parenchyma. No mass or hydronephrosis visualized. 1.7 cm simple cyst present the upper pole. Left Kidney: Renal measurements: 11.1 x 5.9 x 5.2 cm = volume: 178 mL. Mild diffuse increased echogenicity of the parenchyma. No mass or hydronephrosis visualized. 2.4 cm simple cyst present in the lower pole. Bladder: Evaluation limited due to collapse configuration. Other: Enlarged prostate partially visualized. IMPRESSION: Increased echogenicity of the renal cortex consistent with chronic medical renal disease. Electronically Signed   By: Miachel Roux M.D.   On: 06/08/2021 08:22   DG Chest Portable 1 View  Result Date: 06/08/2021 CLINICAL DATA:  Shortness of breath. EXAM: PORTABLE CHEST 1 VIEW COMPARISON:  Chest radiograph dated 11/04/2020. FINDINGS: Diffuse interstitial and airspace density involving the right lung, new since the prior radiograph most  consistent with developing infiltrate. There is background of emphysema. Trace right pleural effusion may be present. No pneumothorax. Cardiac silhouette is within limits. Atherosclerotic calcification of the aorta. Left shoulder arthroplasty. No acute osseous pathology. IMPRESSION: 1. Right lung infiltrate most consistent with pneumonia. 2. Emphysema. Electronically Signed   By: Anner Crete M.D.   On: 06/08/2021 02:53      Labs: BNP (last 3 results) Recent Labs    06/08/21 0218  BNP 024.0*   Basic Metabolic Panel: Recent Labs  Lab 06/10/21 0536 06/11/21 0417 06/12/21 0433 06/13/21 0443 06/14/21 0455 06/15/21 0453 06/16/21 0515  NA 139   < > 139 139 140 138 139  K 3.8   < > 5.0 5.4* 5.6* 5.3* 4.9  CL 98   < > 112* 112* 117* 114* 112*  CO2 33*   < > 20* 20* 18* 20* 21*  GLUCOSE 151*   < > 103* 99 105* 111* 89  BUN 31*   < > 92* 92* 82* 77* 69*  CREATININE 1.90*   < > 2.25* 1.97* 2.03* 1.88* 1.80*  CALCIUM 8.6*   < > 8.3* 8.6* 8.8* 8.7* 8.2*  MG  --    < > 2.3 2.2 2.2 2.0 1.7  PHOS 3.9  --   --   --   --   --   --    < > = values in this interval not displayed.   Liver Function Tests: Recent Labs  Lab 06/10/21 0536  ALBUMIN 2.8*   No results for input(s): LIPASE, AMYLASE in the last 168 hours. No results for input(s): AMMONIA in the last 168 hours. CBC: Recent Labs  Lab 06/12/21 0433 06/13/21 0443 06/14/21 0455 06/15/21 0453 06/16/21 0515  WBC 17.3* 18.5* 19.5* 24.6* 16.7*  HGB 12.8* 12.8* 12.2* 12.0* 11.4*  HCT 37.3* 36.1* 35.1* 35.0* 31.9*  MCV 85.6 87.2 85.6 88.4 86.2  PLT 292 291 290 313 279   Cardiac Enzymes: No results for input(s): CKTOTAL, CKMB, CKMBINDEX, TROPONINI in the last 168 hours. BNP: Invalid input(s): POCBNP CBG: Recent Labs  Lab 06/09/21 1644  GLUCAP 119*   D-Dimer No results for input(s): DDIMER in the last 72 hours. Hgb A1c No results for input(s): HGBA1C in the last 72 hours. Lipid Profile No results for input(s): CHOL,  HDL, LDLCALC, TRIG, CHOLHDL, LDLDIRECT in the last 72 hours. Thyroid function studies No results for input(s): TSH, T4TOTAL, T3FREE, THYROIDAB in the last 72 hours.  Invalid input(s): FREET3 Anemia work up No results for input(s): VITAMINB12, FOLATE, FERRITIN, TIBC, IRON, RETICCTPCT in the last 72 hours. Urinalysis    Component Value Date/Time   COLORURINE YELLOW (A) 06/08/2021 9735  APPEARANCEUR HAZY (A) 06/08/2021 0853   APPEARANCEUR Clear 05/20/2021 1414   LABSPEC 1.018 06/08/2021 0853   PHURINE 5.0 06/08/2021 0853   GLUCOSEU NEGATIVE 06/08/2021 0853   HGBUR NEGATIVE 06/08/2021 0853   BILIRUBINUR NEGATIVE 06/08/2021 0853   BILIRUBINUR Negative 05/20/2021 1414   KETONESUR 5 (A) 06/08/2021 0853   PROTEINUR NEGATIVE 06/08/2021 0853   NITRITE NEGATIVE 06/08/2021 0853   LEUKOCYTESUR NEGATIVE 06/08/2021 0853   Sepsis Labs Invalid input(s): PROCALCITONIN,  WBC,  LACTICIDVEN Microbiology Recent Results (from the past 240 hour(s))  Resp Panel by RT-PCR (Flu A&B, Covid) Nasopharyngeal Swab     Status: None   Collection Time: 06/08/21  2:22 AM   Specimen: Nasopharyngeal Swab; Nasopharyngeal(NP) swabs in vial transport medium  Result Value Ref Range Status   SARS Coronavirus 2 by RT PCR NEGATIVE NEGATIVE Final    Comment: (NOTE) SARS-CoV-2 target nucleic acids are NOT DETECTED.  The SARS-CoV-2 RNA is generally detectable in upper respiratory specimens during the acute phase of infection. The lowest concentration of SARS-CoV-2 viral copies this assay can detect is 138 copies/mL. A negative result does not preclude SARS-Cov-2 infection and should not be used as the sole basis for treatment or other patient management decisions. A negative result may occur with  improper specimen collection/handling, submission of specimen other than nasopharyngeal swab, presence of viral mutation(s) within the areas targeted by this assay, and inadequate number of viral copies(<138 copies/mL). A  negative result must be combined with clinical observations, patient history, and epidemiological information. The expected result is Negative.  Fact Sheet for Patients:  EntrepreneurPulse.com.au  Fact Sheet for Healthcare Providers:  IncredibleEmployment.be  This test is no t yet approved or cleared by the [place] FDA and  has been authorized for detection and/or diagnosis of SARS-CoV-2 by FDA under an Emergency Use Authorization (EUA). This EUA will remain  in effect (meaning this test can be used) for the duration of the COVID-19 declaration under Section 564(b)(1) of the Act, 21 U.S.C.section 360bbb-3(b)(1), unless the authorization is terminated  or revoked sooner.       Influenza A by PCR NEGATIVE NEGATIVE Final   Influenza B by PCR NEGATIVE NEGATIVE Final    Comment: (NOTE) The Xpert Xpress SARS-CoV-2/FLU/RSV plus assay is intended as an aid in the diagnosis of influenza from Nasopharyngeal swab specimens and should not be used as a sole basis for treatment. Nasal washings and aspirates are unacceptable for Xpert Xpress SARS-CoV-2/FLU/RSV testing.  Fact Sheet for Patients: EntrepreneurPulse.com.au  Fact Sheet for Healthcare Providers: IncredibleEmployment.be  This test is not yet approved or cleared by the [place] FDA and has been authorized for detection and/or diagnosis of SARS-CoV-2 by FDA under an Emergency Use Authorization (EUA). This EUA will remain in effect (meaning this test can be used) for the duration of the COVID-19 declaration under Section 564(b)(1) of the Act, 21 U.S.C. section 360bbb-3(b)(1), unless the authorization is terminated or revoked.  Performed at [facility], [address] [place], [place] [address]   Blood Culture (routine x 2)     Status: None   Collection Time: 06/08/21  3:50 AM   Specimen: BLOOD  Result Value Ref Range Status    Specimen Description BLOOD RIGHT [facility]  Final   Special Requests   Final    BOTTLES DRAWN AEROBIC AND ANAEROBIC Blood Culture adequate volume   Culture   Final    NO GROWTH 5 DAYS Performed at [facility], [address] [place], [place] [address]  Report Status 06/13/2021 FINAL  Final  Blood Culture (routine x 2)     Status: None   Collection Time: 06/08/21  3:58 AM   Specimen: BLOOD  Result Value Ref Range Status   Specimen Description BLOOD RIGHT HAND  Final   Special Requests   Final    BOTTLES DRAWN AEROBIC AND ANAEROBIC Blood Culture adequate volume   Culture   Final    NO GROWTH 5 DAYS Performed at [facility], [address], [place], [place] [address]    Report Status 06/13/2021 FINAL  Final  SARS CORONAVIRUS 2 (TAT 6-24 HRS) Nasopharyngeal Nasopharyngeal Swab     Status: None   Collection Time: 06/15/21 12:49 PM   Specimen: Nasopharyngeal Swab  Result Value Ref Range Status   SARS Coronavirus 2 NEGATIVE NEGATIVE Final    Comment: (NOTE) SARS-CoV-2 target nucleic acids are NOT DETECTED.  The SARS-CoV-2 RNA is generally detectable in upper and lower respiratory specimens during the acute phase of infection. Negative results do not preclude SARS-CoV-2 infection, do not rule out co-infections with other pathogens, and should not be used as the sole basis for treatment or other patient management decisions. Negative results must be combined with clinical observations, patient history, and epidemiological information. The expected result is Negative.  Fact Sheet for Patients: SugarRoll.be  Fact Sheet for Healthcare Providers: https://www.woods-mathews.com/  This test is not yet approved or cleared by the [place] FDA and  has been authorized for detection and/or diagnosis of SARS-CoV-2 by FDA under an Emergency Use Authorization (EUA). This EUA will remain  in effect (meaning this test can be used)  for the duration of the COVID-19 declaration under Se ction 564(b)(1) of the Act, 21 U.S.C. section 360bbb-3(b)(1), unless the authorization is terminated or revoked sooner.  Performed at [facility], [place] [address], [place], [place] [address]      Total time spend on discharging this patient, including the last patient exam, discussing the hospital stay, instructions for ongoing care as it relates to all pertinent caregivers, as well as preparing the medical discharge records, prescriptions, and/or referrals as applicable, is 35 minutes.    Enzo Bi, MD  [facility] 06/16/2021, 9:26 AM

## 2021-06-16 NOTE — Plan of Care (Signed)
Pt had 1 medium sized bowel movement after kayexalate. Potassium 4.9 from 5.3 Creatinine 1.8 from 1.88 this morning. Pt had a pleasant night stating several times "I feel great."  Problem: Health Behavior/Discharge Planning: Goal: Ability to manage health-related needs will improve Outcome: Progressing   Problem: Education: Goal: Knowledge of General Education information will improve Description: Including pain rating scale, medication(s)/side effects and non-pharmacologic comfort measures Outcome: Progressing   Problem: Activity: Goal: Risk for activity intolerance will decrease Outcome: Progressing   Problem: Elimination: Goal: Will not experience complications related to bowel motility Outcome: Progressing Goal: Will not experience complications related to urinary retention Outcome: Progressing

## 2021-06-16 NOTE — Progress Notes (Signed)
Patient being discharged to [facility] room 705. Called report to [place]. IV removed before discharge. Patient being transported via EMS.

## 2021-06-16 NOTE — Progress Notes (Signed)
Physical Therapy Treatment Patient Details Name: George [name] MRN: [id number] DOB: 15-Jun-1932 Today's Date: 06/16/2021    History of Present Illness Pt is an 87 y.o. male with medical history significant of CKD-IIIb, hypertension, hyperlipidemia, diabetes mellitus, COPD, GERD, gout, OSA, CAD, stent placement, colon cancer, BPH, anemia, tobacco abuse, and per patient L TSA April 2022 who presented with shortness of breath. MD assessment includes: Sepsis secondary to community-acquired pneumonia and COPD exacerbation/acute hypoxic respiratory failure, AKI, and hyperkalemia.    PT Comments    Pt was pleasant and motivated to participate during the session and made good progress towards goals.  Pt was able to stand and perform minimal standing therex as well as to ambulate around 10 feet without LOB.  Pt's SpO2 monitored frequently during the session on room air with baseline sats around 96-97% dropping to a low of 91% that occurred during ambulation.  Pt's SpO2 quickly increased back to the mid 90s upon returning to sitting.  Pt will benefit from PT services in a SNF setting upon discharge to safely address deficits listed in patient problem list for decreased caregiver assistance and eventual return to PLOF.     Follow Up Recommendations  SNF;Supervision/Assistance - 24 hour     Equipment Recommendations  None recommended by PT    Recommendations for Other Services       Precautions / Restrictions Precautions Precautions: Fall Restrictions Weight Bearing Restrictions: No    Mobility  Bed Mobility               General bed mobility comments: NT, pt in recliner    Transfers Overall transfer level: Needs assistance Equipment used: Rolling walker (2 wheeled) Transfers: Sit to/from Stand Sit to Stand: Min assist;Mod assist         General transfer comment: Min to mod A with mod verbal and tactile cues for sequencing to come to full upright  standing  Ambulation/Gait Ambulation/Gait assistance: Min guard Gait Distance (Feet): 10 Feet Assistive device: Rolling walker (2 wheeled) Gait Pattern/deviations: Step-through pattern;Decreased step length - right;Decreased step length - left;Trunk flexed Gait velocity: decreased   General Gait Details: Pt able to amb 10 feet with a RW without LOB on room air with SpO2 dropping to a low of 91% from a baseline of 97% but quickly increased back to the mid 90s upon returning to sitting.   Stairs             Wheelchair Mobility    Modified Rankin (Stroke Patients Only)       Balance Overall balance assessment: Needs assistance Sitting-balance support: No upper extremity supported;Feet supported Sitting balance-Leahy Scale: Good     Standing balance support: Bilateral upper extremity supported;During functional activity Standing balance-Leahy Scale: Fair Standing balance comment: Min to mod lean on the RW for support but no LOB during standing activities                            Cognition Arousal/Alertness: Awake/alert Behavior During Therapy: WFL for tasks assessed/performed Overall Cognitive Status: No family/caregiver present to determine baseline cognitive functioning                                        Exercises Total Joint Exercises Ankle Circles/Pumps: Strengthening;Both;10 reps;5 reps (with manual resistance) Quad Sets: Strengthening;Both;5 reps;10 reps Gluteal Sets: 10 reps;5 reps;Both;Strengthening Hip ABduction/ADduction:  AROM;Strengthening;Both;10 reps Straight Leg Raises: AAROM;Strengthening;Both;10 reps Long Arc Quad: AROM;Strengthening;Both;10 reps Marching in Standing: AROM;Strengthening;Both;5 reps;Standing Other Exercises Other Exercises: HEP education for BLE APs, QS, and GS x 10 each every 1-2 hours daily    General Comments        Pertinent Vitals/Pain Pain Assessment: No/denies pain    Home Living                       Prior Function            PT Goals (current goals can now be found in the care plan section) Progress towards PT goals: Progressing toward goals    Frequency    Min 2X/week      PT Plan Current plan remains appropriate    Co-evaluation              AM-PAC PT "6 Clicks" Mobility   Outcome Measure  Help needed turning from your back to your side while in a flat bed without using bedrails?: A Little Help needed moving from lying on your back to sitting on the side of a flat bed without using bedrails?: A Little Help needed moving to and from a bed to a chair (including a wheelchair)?: A Little Help needed standing up from a chair using your arms (e.g., wheelchair or bedside chair)?: A Little Help needed to walk in hospital room?: A Little Help needed climbing 3-5 steps with a railing? : A Lot 6 Click Score: 17    End of Session Equipment Utilized During Treatment: Gait belt Activity Tolerance: Patient tolerated treatment well Patient left: in chair;with call bell/phone within reach;with chair alarm set Nurse Communication: Mobility status PT Visit Diagnosis: History of falling (Z91.81);Difficulty in walking, not elsewhere classified (R26.2);Muscle weakness (generalized) (M62.81)     Time: [id number] PT Time Calculation (min) (ACUTE ONLY): 27 min  Charges:  $Gait Training: 8-22 mins $Therapeutic Exercise: 8-22 mins                     D. Scott Holland Nickson PT, DPT 06/16/21, 1:54 PM

## 2021-06-16 NOTE — TOC Transition Note (Signed)
Transition of Care [facility]) - CM/SW Discharge Note   Patient Details  Name: George [name] MRN: [id number] Date of Birth: [birth year]  [facility]) CM/SW Contact:  Shelbie Hutching, RN Phone Number: 06/16/2021, 1:27 PM   Clinical Narrative:    Patient medically cleared for discharge to SNF today.  Originally [facility] offered a bed but they do not have staffing to admit today.  [facility] can accept patient today.  Insurance authorization with [employer] through [place] changed to [facility].  Patient approved from today through 9/8.    Patient will be going to room 705, bedside RN will call report to [phone].  RNCM will arrange EMS transport.     Final next level of care: Skilled Nursing Facility Barriers to Discharge: Barriers Resolved   Patient Goals and CMS Choice Patient states their goals for this hospitalization and ongoing recovery are:: patient agrees to SNF - originally he was worried about insurance coverage CMS Medicare.gov Compare Post Acute Care list provided to:: Patient Choice offered to / list presented to : Patient  Discharge Placement              Patient chooses bed at: [facility] [place] Patient to be transferred to facility by: [facility] Name of family member notified: no family Patient and family notified of of transfer: 06/16/21  Discharge Plan and Services   Discharge Planning Services: CM Consult Post Acute Care Choice: [place]          DME Arranged: N/A DME Agency: [facility]       HH Arranged: NA HH Agency: NA Date HH Agency Contacted: 06/11/21 Time [place]: 1409 Representative spoke with at [place]: Going to [facility]  Social Determinants of Health (SDOH) Interventions     Readmission Risk Interventions No flowsheet data found.

## 2021-06-16 NOTE — Care Management Important Message (Signed)
Important Message  Patient Details  Name: George [name] MRN: [id number] Date of Birth: Apr 05, 1932   Medicare Important Message Given:  Other (see comment)  Patient is in an isolation room so I called his room [phone]) to review the Important Message from Medicare but there was no answer.  Will call again later.   [name] 06/16/2021, 9:42 AM

## 2021-06-16 NOTE — Care Management Important Message (Signed)
Important Message  Patient Details  Name: George [name] MRN: [id number] Date of Birth: March 19, 1932   Medicare Important Message Given:  Other (see comment)  Tried reaching the patient by phone [phone]) since in an isolation room but the line was busy. Try again.  Juliann Pulse A Maryum Batterson 06/16/2021, 11:43 AM

## 2021-06-16 NOTE — Progress Notes (Signed)
[facility]  ROUNDING NOTE   Subjective:   George [name] is a 87 y.o. male who presents to ED via EMS for chest pain. Chest x-ray showed right lower lobe pneumonia. We were consulted to evaluate acute kidney injury.  Patient  seen and evaluated in chair States he feels great this morning Tolerating meals Weaned to room air  Creatinine 1.08 (1.88) (2.03) (1.97) (2.25) (2.61) (1.90) (4.07) (4.57) Urine output yesterday- 1650cc  Objective:  Vital signs in last 24 hours:  Temp:  [98.1 F (36.7 C)-98.8 F (37.1 C)] 98.7 F (37.1 C) (09/06 0804) Pulse Rate:  [82-101] 90 (09/06 0804) Resp:  [16-20] 20 (09/06 0804) BP: (142-168)/(72-83) 168/83 (09/06 0804) SpO2:  [93 %-96 %] 94 % (09/06 0804)  Weight change:  Filed Weights   06/10/21 0900 06/11/21 0348 06/12/21 0500  Weight: 81 kg 80.7 kg 81.6 kg    Intake/Output: I/O last 3 completed shifts: In: 8 [P.O.:880] Out: 1950 [Urine:1950]   Intake/Output this shift:  Total I/O In: -  Out: 350 [Urine:350]  Physical Exam: General: NAD, sitting in chair  Head: Normocephalic, atraumatic. Moist oral mucosal membranes  Eyes: Anicteric  Lungs:  Clear bilaterally, normal effort  Heart: Regular rate and rhythm  Abdomen:  Soft, nontender  Extremities: no peripheral edema.  Neurologic: Nonfocal, moving all four extremities  Skin: No lesions       Basic Metabolic Panel: Recent Labs  Lab 06/10/21 0536 06/11/21 0417 06/12/21 0433 06/13/21 0443 06/14/21 0455 06/15/21 0453 06/16/21 0515  NA 139   < > 139 139 140 138 139  K 3.8   < > 5.0 5.4* 5.6* 5.3* 4.9  CL 98   < > 112* 112* 117* 114* 112*  CO2 33*   < > 20* 20* 18* 20* 21*  GLUCOSE 151*   < > 103* 99 105* 111* 89  BUN 31*   < > 92* 92* 82* 77* 69*  CREATININE 1.90*   < > 2.25* 1.97* 2.03* 1.88* 1.80*  CALCIUM 8.6*   < > 8.3* 8.6* 8.8* 8.7* 8.2*  MG  --    < > 2.3 2.2 2.2 2.0 1.7  PHOS 3.9  --   --   --   --   --   --    < > = values in this interval  not displayed.     Liver Function Tests: Recent Labs  Lab 06/10/21 0536  ALBUMIN 2.8*    No results for input(s): LIPASE, AMYLASE in the last 168 hours. No results for input(s): AMMONIA in the last 168 hours.  CBC: Recent Labs  Lab 06/12/21 0433 06/13/21 0443 06/14/21 0455 06/15/21 0453 06/16/21 0515  WBC 17.3* 18.5* 19.5* 24.6* 16.7*  HGB 12.8* 12.8* 12.2* 12.0* 11.4*  HCT 37.3* 36.1* 35.1* 35.0* 31.9*  MCV 85.6 87.2 85.6 88.4 86.2  PLT 292 291 290 313 279     Cardiac Enzymes: No results for input(s): CKTOTAL, CKMB, CKMBINDEX, TROPONINI in the last 168 hours.  BNP: Invalid input(s): POCBNP  CBG: Recent Labs  Lab 06/09/21 1644  GLUCAP 119*     Microbiology: Results for orders placed or performed during the hospital encounter of 06/08/21  Resp Panel by RT-PCR (Flu A&B, Covid) Nasopharyngeal Swab     Status: None   Collection Time: 06/08/21  2:22 AM   Specimen: Nasopharyngeal Swab; Nasopharyngeal(NP) swabs in vial transport medium  Result Value Ref Range Status   SARS Coronavirus 2 by RT PCR NEGATIVE NEGATIVE Final  Comment: (NOTE) SARS-CoV-2 target nucleic acids are NOT DETECTED.  The SARS-CoV-2 RNA is generally detectable in upper respiratory specimens during the acute phase of infection. The lowest concentration of SARS-CoV-2 viral copies this assay can detect is 138 copies/mL. A negative result does not preclude SARS-Cov-2 infection and should not be used as the sole basis for treatment or other patient management decisions. A negative result may occur with  improper specimen collection/handling, submission of specimen other than nasopharyngeal swab, presence of viral mutation(s) within the areas targeted by this assay, and inadequate number of viral copies(<138 copies/mL). A negative result must be combined with clinical observations, patient history, and epidemiological information. The expected result is Negative.  Fact Sheet for Patients:   EntrepreneurPulse.com.au  Fact Sheet for Healthcare Providers:  IncredibleEmployment.be  This test is no t yet approved or cleared by the [place] FDA and  has been authorized for detection and/or diagnosis of SARS-CoV-2 by FDA under an Emergency Use Authorization (EUA). This EUA will remain  in effect (meaning this test can be used) for the duration of the COVID-19 declaration under Section 564(b)(1) of the Act, 21 U.S.C.section 360bbb-3(b)(1), unless the authorization is terminated  or revoked sooner.       Influenza A by PCR NEGATIVE NEGATIVE Final   Influenza B by PCR NEGATIVE NEGATIVE Final    Comment: (NOTE) The Xpert Xpress SARS-CoV-2/FLU/RSV plus assay is intended as an aid in the diagnosis of influenza from Nasopharyngeal swab specimens and should not be used as a sole basis for treatment. Nasal washings and aspirates are unacceptable for Xpert Xpress SARS-CoV-2/FLU/RSV testing.  Fact Sheet for Patients: EntrepreneurPulse.com.au  Fact Sheet for Healthcare Providers: IncredibleEmployment.be  This test is not yet approved or cleared by the [place] FDA and has been authorized for detection and/or diagnosis of SARS-CoV-2 by FDA under an Emergency Use Authorization (EUA). This EUA will remain in effect (meaning this test can be used) for the duration of the COVID-19 declaration under Section 564(b)(1) of the Act, 21 U.S.C. section 360bbb-3(b)(1), unless the authorization is terminated or revoked.  Performed at [facility], [address] [place], [place] [address]   Blood Culture (routine x 2)     Status: None   Collection Time: 06/08/21  3:50 AM   Specimen: BLOOD  Result Value Ref Range Status   Specimen Description BLOOD RIGHT [facility]  Final   Special Requests   Final    BOTTLES DRAWN AEROBIC AND ANAEROBIC Blood Culture adequate volume   Culture   Final    NO GROWTH 5  DAYS Performed at [facility], [address], [place], [place] [address]    Report Status 06/13/2021 FINAL  Final  Blood Culture (routine x 2)     Status: None   Collection Time: 06/08/21  3:58 AM   Specimen: BLOOD  Result Value Ref Range Status   Specimen Description BLOOD RIGHT HAND  Final   Special Requests   Final    BOTTLES DRAWN AEROBIC AND ANAEROBIC Blood Culture adequate volume   Culture   Final    NO GROWTH 5 DAYS Performed at [facility], [address], [place], [place] [address]    Report Status 06/13/2021 FINAL  Final  SARS CORONAVIRUS 2 (TAT 6-24 HRS) Nasopharyngeal Nasopharyngeal Swab     Status: None   Collection Time: 06/15/21 12:49 PM   Specimen: Nasopharyngeal Swab  Result Value Ref Range Status   SARS Coronavirus 2 NEGATIVE NEGATIVE Final  Comment: (NOTE) SARS-CoV-2 target nucleic acids are NOT DETECTED.  The SARS-CoV-2 RNA is generally detectable in upper and lower respiratory specimens during the acute phase of infection. Negative results do not preclude SARS-CoV-2 infection, do not rule out co-infections with other pathogens, and should not be used as the sole basis for treatment or other patient management decisions. Negative results must be combined with clinical observations, patient history, and epidemiological information. The expected result is Negative.  Fact Sheet for Patients: SugarRoll.be  Fact Sheet for Healthcare Providers: https://www.woods-mathews.com/  This test is not yet approved or cleared by the [place] FDA and  has been authorized for detection and/or diagnosis of SARS-CoV-2 by FDA under an Emergency Use Authorization (EUA). This EUA will remain  in effect (meaning this test can be used) for the duration of the COVID-19 declaration under Se ction 564(b)(1) of the Act, 21 U.S.C. section 360bbb-3(b)(1), unless the authorization is terminated or revoked  sooner.  Performed at [facility], [place] [address], [place], [place] [address]     Coagulation Studies: No results for input(s): LABPROT, INR in the last 72 hours.  Urinalysis: No results for input(s): COLORURINE, LABSPEC, PHURINE, GLUCOSEU, HGBUR, BILIRUBINUR, KETONESUR, PROTEINUR, UROBILINOGEN, NITRITE, LEUKOCYTESUR in the last 72 hours.  Invalid input(s): APPERANCEUR     Imaging: No results found.   Medications:      alfuzosin  10 mg Oral Q breakfast   aspirin EC  81 mg Oral Daily   colchicine  0.3 mg Oral Daily   finasteride  5 mg Oral Daily   heparin  5,000 Units Subcutaneous Q8H   mometasone-formoterol  2 puff Inhalation BID   nicotine  21 mg Transdermal Daily   pantoprazole  40 mg Oral Daily   predniSONE  20 mg Oral Q breakfast   simvastatin  10 mg Oral QHS   verapamil  240 mg Oral BID   acetaminophen, albuterol, dextromethorphan-guaiFENesin, hydrALAZINE, hyoscyamine, ipratropium-albuterol, LORazepam, ondansetron (ZOFRAN) IV  Assessment/ Plan:  George [name] is a 87 y.o.  male ho presents to ED via EMS for chest pain. Chest x-ray showed right lower lobe pneumonia. We were consulted to evaluate acute kidney injury.  Acute Kidney Injury on chronic kidney disease stage 3b with baseline creatinine 1.51 and GFR of 44 on 02/12/21.  Acute kidney injury secondary to ATN from ongoing sepsis Renal US has echogenic kidneys, enlarged prostate and no obstruction. UA negative for protein and blood. Creatinine continues to improve. BUN decreased to 69 today. Continue to avoid nephrotoxins as possible.  Lab Results  Component Value Date   CREATININE 1.80 (H) 06/16/2021   CREATININE 1.88 (H) 06/15/2021   CREATININE 2.03 (H) 06/14/2021    Intake/Output Summary (Last 24 hours) at 06/16/2021 1011 Last data filed at 06/16/2021 0804 Gross per 24 hour  Intake 400 ml  Output 2000 ml  Net -1600 ml    2. Hyperkalemia  Potassium at 4.9  Will monitor  3.Acute on  chronic resp failure due to sepsis Underlying severe emphysema, RLL pneumonia  Antibiotic therapy completed    LOS: 8   9/6/202210:11 AM

## 2021-07-06 ENCOUNTER — Other Ambulatory Visit: Payer: Self-pay

## 2021-07-06 ENCOUNTER — Emergency Department
Admission: EM | Admit: 2021-07-06 | Discharge: 2021-07-06 | Disposition: A | Discharge status: Home / Self Care | Payer: Medicare Other | Attending: Emergency Medicine | Admitting: Emergency Medicine

## 2021-07-06 DIAGNOSIS — R300 Dysuria: Secondary | ICD-10-CM | POA: Insufficient documentation

## 2021-07-06 DIAGNOSIS — Z859 Personal history of malignant neoplasm, unspecified: Secondary | ICD-10-CM | POA: Insufficient documentation

## 2021-07-06 DIAGNOSIS — Z7982 Long term (current) use of aspirin: Secondary | ICD-10-CM | POA: Diagnosis not present

## 2021-07-06 DIAGNOSIS — Z79899 Other long term (current) drug therapy: Secondary | ICD-10-CM | POA: Diagnosis not present

## 2021-07-06 DIAGNOSIS — N1832 Chronic kidney disease, stage 3b: Secondary | ICD-10-CM | POA: Diagnosis not present

## 2021-07-06 DIAGNOSIS — F17210 Nicotine dependence, cigarettes, uncomplicated: Secondary | ICD-10-CM | POA: Insufficient documentation

## 2021-07-06 DIAGNOSIS — Z96612 Presence of left artificial shoulder joint: Secondary | ICD-10-CM | POA: Diagnosis not present

## 2021-07-06 DIAGNOSIS — I1310 Hypertensive heart and chronic kidney disease without heart failure, with stage 1 through stage 4 chronic kidney disease, or unspecified chronic kidney disease: Secondary | ICD-10-CM | POA: Diagnosis not present

## 2021-07-06 DIAGNOSIS — E1122 Type 2 diabetes mellitus with diabetic chronic kidney disease: Secondary | ICD-10-CM | POA: Diagnosis not present

## 2021-07-06 DIAGNOSIS — I2510 Atherosclerotic heart disease of native coronary artery without angina pectoris: Secondary | ICD-10-CM | POA: Diagnosis not present

## 2021-07-06 DIAGNOSIS — R339 Retention of urine, unspecified: Secondary | ICD-10-CM | POA: Insufficient documentation

## 2021-07-06 DIAGNOSIS — J441 Chronic obstructive pulmonary disease with (acute) exacerbation: Secondary | ICD-10-CM | POA: Insufficient documentation

## 2021-07-06 LAB — URINALYSIS, COMPLETE (UACMP) WITH MICROSCOPIC
Bilirubin Urine: NEGATIVE
Glucose, UA: NEGATIVE mg/dL
Ketones, ur: NEGATIVE mg/dL
Leukocytes,Ua: NEGATIVE
Nitrite: NEGATIVE
Protein, ur: 100 mg/dL — AB
Specific Gravity, Urine: 1.006 (ref 1.005–1.030)
pH: 5 (ref 5.0–8.0)

## 2021-07-06 LAB — CBC WITH DIFFERENTIAL/PLATELET
Abs Immature Granulocytes: 0.05 10*3/uL (ref 0.00–0.07)
Basophils Absolute: 0 10*3/uL (ref 0.0–0.1)
Basophils Relative: 1 %
Eosinophils Absolute: 0.1 10*3/uL (ref 0.0–0.5)
Eosinophils Relative: 1 %
HCT: 35 % — ABNORMAL LOW (ref 39.0–52.0)
Hemoglobin: 12.2 g/dL — ABNORMAL LOW (ref 13.0–17.0)
Immature Granulocytes: 1 %
Lymphocytes Relative: 15 %
Lymphs Abs: 1 10*3/uL (ref 0.7–4.0)
MCH: 30.8 pg (ref 26.0–34.0)
MCHC: 34.9 g/dL (ref 30.0–36.0)
MCV: 88.4 fL (ref 80.0–100.0)
Monocytes Absolute: 1 10*3/uL (ref 0.1–1.0)
Monocytes Relative: 14 %
Neutro Abs: 4.9 10*3/uL (ref 1.7–7.7)
Neutrophils Relative %: 68 %
Platelets: 212 10*3/uL (ref 150–400)
RBC: 3.96 MIL/uL — ABNORMAL LOW (ref 4.22–5.81)
RDW: 16.1 % — ABNORMAL HIGH (ref 11.5–15.5)
WBC: 7 10*3/uL (ref 4.0–10.5)
nRBC: 0 % (ref 0.0–0.2)

## 2021-07-06 LAB — COMPREHENSIVE METABOLIC PANEL
ALT: 15 U/L (ref 0–44)
AST: 25 U/L (ref 15–41)
Albumin: 2.9 g/dL — ABNORMAL LOW (ref 3.5–5.0)
Alkaline Phosphatase: 89 U/L (ref 38–126)
Anion gap: 10 (ref 5–15)
BUN: 19 mg/dL (ref 8–23)
CO2: 21 mmol/L — ABNORMAL LOW (ref 22–32)
Calcium: 7.9 mg/dL — ABNORMAL LOW (ref 8.9–10.3)
Chloride: 101 mmol/L (ref 98–111)
Creatinine, Ser: 1.57 mg/dL — ABNORMAL HIGH (ref 0.61–1.24)
GFR, Estimated: 42 mL/min — ABNORMAL LOW (ref >60)
Glucose, Bld: 79 mg/dL (ref 70–99)
Potassium: 4.6 mmol/L (ref 3.5–5.1)
Sodium: 132 mmol/L — ABNORMAL LOW (ref 135–145)
Total Bilirubin: 1 mg/dL (ref 0.3–1.2)
Total Protein: 5.2 g/dL — ABNORMAL LOW (ref 6.5–8.1)

## 2021-07-06 NOTE — ED Notes (Signed)
Signature pad not working... granddaughter took verbal consent.

## 2021-07-06 NOTE — ED Notes (Signed)
A George [name] called to advise she would pick up pt. I attempted to call number back. No answer

## 2021-07-06 NOTE — Discharge Instructions (Signed)
You were in urinary retention today.  You should keep the Foley catheter in place for the next week.  Please follow-up with urology to trial a removal of the Foley catheter.  Your renal function however was good.

## 2021-07-06 NOTE — ED Notes (Signed)
Unable to find address. Called EMS supervisor for physical address.

## 2021-07-06 NOTE — ED Provider Notes (Signed)
[facility]  ____________________________________________   Event Date/Time   First MD Initiated Contact with Patient 07/06/21 0732     (approximate)  I have reviewed the triage vital signs and the nursing notes.   HISTORY  Chief Complaint Dysuria    HPI George [name] is a 87 y.o. male past medical history of BPH, COPD, CAD status post stent, hypertension, hyperlipidemia who presents with urinary retention.  On arrival patient is in significant distress, complaining of severe bladder pain.  He does not remember the last time he was able to urinate.  Tells me he has had to have a Foley catheter placed in the past.  Denies fevers or chills.  No other complaints at this time.         Past Medical History:  Diagnosis Date   Anemia    Arthritis    BPH (benign prostatic hyperplasia)    Cancer (HCC)    COLON   COPD (chronic obstructive pulmonary disease) ([place])    Coronary artery disease    WITH 1 STENT   Diabetes mellitus without complication (HCC)    Dyspnea    WITH EXERTION ONLY   GERD (gastroesophageal reflux disease)    Hypercholesteremia    Hypertension    Pneumonia    Sleep apnea    HAD UPPP    Patient Active Problem List   Diagnosis Date Noted   Acute on chronic respiratory failure with hypoxia ([place]) 06/08/2021   Gout 06/08/2021   Sepsis ([place]) 06/08/2021   COPD exacerbation ([place]) 06/08/2021   CAP (community acquired pneumonia) 06/08/2021   Status post reverse arthroplasty of shoulder, left 02/10/2021   Acute kidney injury superimposed on CKD IIIb ([place]) 11/04/2020   BPH with obstruction/lower urinary tract symptoms 11/04/2020   Hyperkalemia 11/04/2020   Acute urinary retention 11/04/2020   Pneumonia 06/25/2019   Foot pain 07/11/2018   Urinary urgency 06/27/2017   Erectile dysfunction 06/27/2017   Stage 3 acute kidney injury ([place]) 06/27/2017   Hypogonadism in male 06/27/2017   Vomiting 02/09/2016   Slurred speech 02/09/2016    Nausea and vomiting 02/09/2016   Lower abdominal pain 02/09/2016   Tobacco abuse 02/09/2016   Essential hypertension 02/09/2016   GERD (gastroesophageal reflux disease) 02/09/2016   Hyperlipidemia 04/22/2014   Hypertension 04/22/2014   COPD (chronic obstructive pulmonary disease) ([place]) 04/22/2014   Type II diabetes mellitus with renal manifestations ([place]) 04/22/2014   CAD (coronary artery disease) 09/10/1998    Past Surgical History:  Procedure Laterality Date   CARDIAC CATHETERIZATION     has a stent   COLON SURGERY     DUE TO COLON CANCER   COLONOSCOPY     MULTIPLE   EYE SURGERY Bilateral    CATARACT   HERNIA REPAIR     REVERSE SHOULDER ARTHROPLASTY Left 02/10/2021   Procedure: REVERSE SHOULDER ARTHROPLASTY;  Surgeon: Corky Mull, MD;  Location: [facility] ORS;  Service: Orthopedics;  Laterality: Left;   TONSILLECTOMY     UVULOPALATOPHARYNGOPLASTY      Prior to Admission medications   Medication Sig Start Date End Date Taking? Authorizing Provider  albuterol (VENTOLIN HFA) 108 (90 Base) MCG/ACT inhaler Inhale 1-2 puffs into the lungs every 6 (six) hours as needed for wheezing or shortness of breath.    [provider]  alfuzosin (UROXATRAL) 10 MG 24 hr tablet Take 1 tablet (10 mg total) by mouth daily with breakfast. This replaces doxazosin 12/12/20   [name], MD  aspirin EC  81 MG tablet Take 81 mg by mouth daily. Swallow whole.    [provider]  colchicine 0.6 MG tablet Take 0.5 tablets (0.3 mg total) by mouth daily. 06/17/21   Enzo Bi, MD  cyanocobalamin (,VITAMIN B-12,) 1000 MCG/ML injection Inject 1,000 mcg into the muscle every 30 (thirty) days.    [provider]  finasteride (PROSCAR) 5 MG tablet Take 1 tablet (5 mg total) by mouth daily. 12/12/20 12/07/21  [name], MD  Fluticasone-Umeclidin-Vilant (TRELEGY ELLIPTA) 100-62.5-25 MCG/INH AEPB Inhale 1 puff into the lungs daily.    [provider]  hyoscyamine (LEVSIN) 0.125 MG  tablet Take 0.125 mg by mouth every 4 (four) hours as needed.    [provider]  lisinopril (PRINIVIL,ZESTRIL) 20 MG tablet Take 20 mg by mouth 2 (two) times daily.    [provider]  nicotine (NICODERM CQ - DOSED IN MG/24 HOURS) 21 mg/24hr patch Place 1 patch (21 mg total) onto the skin daily. 06/16/21   Enzo Bi, MD  omeprazole (PRILOSEC) 20 MG capsule Take 20 mg by mouth daily.    [provider]  simvastatin (ZOCOR) 20 MG tablet Take 10 mg by mouth at bedtime.    [provider]  testosterone cypionate (DEPOTESTOSTERONE CYPIONATE) 200 MG/ML injection Inject 200 mg into the muscle every 14 (fourteen) days.    [provider]  verapamil (CALAN-SR) 240 MG CR tablet Take 240 mg by mouth 2 (two) times daily.    [provider]    Allergies Patient has no known allergies.  Family History  Problem Relation Age of Onset   Prostate cancer Neg Hx    Bladder Cancer Neg Hx    Kidney cancer Neg Hx     Social History Social History   Tobacco Use   Smoking status: Every Day    Packs/day: 1.00    Years: 60.00    Pack years: 60.00    Types: Cigarettes   Smokeless tobacco: Never  Vaping Use   Vaping Use: Never used  Substance Use Topics   Alcohol use: Yes    Comment: occ WINE   Drug use: No    Review of Systems   Review of Systems  Constitutional:  Negative for chills and fever.  Gastrointestinal:  Positive for anal bleeding.  Genitourinary:  Positive for difficulty urinating. Negative for dysuria.  All other systems reviewed and are negative.  Physical Exam Updated Vital Signs BP (!) 167/89 (BP Location: Left Arm)   Pulse (!) 105   Temp 98 F (36.7 C) (Oral)   Resp 20   Ht 5\' 6"  (1.676 m)   Wt 81.6 kg   SpO2 96%   BMI 29.05 kg/m   Physical Exam Vitals and nursing note reviewed.  Constitutional:      General: He is in acute distress.     Appearance: Normal appearance.     Comments: Patient is yelling and writhing  in pain, appears distressed  HENT:     Head: Normocephalic and atraumatic.  Eyes:     General: No scleral icterus.    Conjunctiva/sclera: Conjunctivae normal.  Pulmonary:     Effort: Pulmonary effort is normal. No respiratory distress.     Breath sounds: Normal breath sounds. No wheezing.  Abdominal:     Palpations: Abdomen is soft. There is mass.     Comments: Palpable bladder, abdomen soft  Genitourinary:    Penis: Normal.      Testes: Normal.  Musculoskeletal:  General: No deformity or signs of injury.     Cervical back: Normal range of motion.  Skin:    Coloration: Skin is not jaundiced or pale.  Neurological:     General: No focal deficit present.     Mental Status: He is alert and oriented to person, place, and time. Mental status is at baseline.  Psychiatric:        Mood and Affect: Mood normal.        Behavior: Behavior normal.     LABS (all labs ordered are listed, but only abnormal results are displayed)  Labs Reviewed  COMPREHENSIVE METABOLIC PANEL - Abnormal; Notable for the following components:      Result Value   Sodium 132 (*)    CO2 21 (*)    Creatinine, Ser 1.57 (*)    Calcium 7.9 (*)    Total Protein 5.2 (*)    Albumin 2.9 (*)    GFR, Estimated 42 (*)    All other components within normal limits  CBC WITH DIFFERENTIAL/PLATELET - Abnormal; Notable for the following components:   RBC 3.96 (*)    Hemoglobin 12.2 (*)    HCT 35.0 (*)    RDW 16.1 (*)    All other components within normal limits  URINALYSIS, COMPLETE (UACMP) WITH MICROSCOPIC - Abnormal; Notable for the following components:   Color, Urine YELLOW (*)    APPearance CLEAR (*)    Hgb urine dipstick SMALL (*)    Protein, ur 100 (*)    Bacteria, UA RARE (*)    All other components within normal limits  URINE CULTURE   ____________________________________________  EKG  N/a ____________________________________________  RADIOLOGY Almeta Monas, personally viewed and  evaluated these images (plain radiographs) as part of my medical decision making, as well as reviewing the written report by the radiologist.  ED MD interpretation: Bedside ultrasound performed which shows a full bladder and a large prostate    ____________________________________________   PROCEDURES  Procedure(s) performed (including Critical Care):  Procedures   ____________________________________________   INITIAL IMPRESSION / ASSESSMENT AND PLAN / ED COURSE     87 year old male with history of BPH who presents with urinary retention.  Unclear when the last time he was able to urinate.  On arrival he is in significant distress.  He has a palpable bladder.  Bedside ultrasound shows a full bladder.  Will place Foley catheter and check UA.  We will also check a BMP to ensure he does not have AKI secondary to retention.  Creatinine actually improved from prior.  His UA is not consistent with infection.  Urine culture sent for screening purposes.  Foley was placed and he put out about 400 cc of urine and felt significantly improved.  We will keep this in place.  He was told to follow-up with urology in about a week for trial of void.  Clinical Course as of 07/06/21 0840  Mon Jul 06, 2021  0805 Bacteria, UA(!): RARE [KM]  0806 WBC, UA: 0-5 [KM]  0806 UA not c/w infection [KM]  0835 Creatinine(!): 1.57 Improved from prior [KM]    Clinical Course User Index [KM] Rada Hay, MD     ____________________________________________   FINAL CLINICAL IMPRESSION(S) / ED DIAGNOSES  Final diagnoses:  Urinary retention     ED Discharge Orders     None        Note:  This document was prepared using Dragon voice recognition software and may include unintentional dictation  errors.    Rada Hay, MD 07/06/21 [phone]

## 2021-07-06 NOTE — ED Triage Notes (Signed)
BIB EMS from home for pain to his groin. Pt called EMS. Lives alone. CAOx4 for EMS. inability to urinate today.   20G LFA by EMS  76mcg of fentanyl. BGL 101 T 98.9 HR 110

## 2021-07-07 LAB — URINE CULTURE: Culture: NO GROWTH

## 2021-07-15 ENCOUNTER — Other Ambulatory Visit: Payer: Self-pay

## 2021-07-15 ENCOUNTER — Ambulatory Visit (INDEPENDENT_AMBULATORY_CARE_PROVIDER_SITE_OTHER): Payer: Medicare Other | Admitting: Physician Assistant

## 2021-07-15 ENCOUNTER — Ambulatory Visit: Payer: Medicare Other | Admitting: Physician Assistant

## 2021-07-15 ENCOUNTER — Encounter: Payer: Self-pay | Admitting: Physician Assistant

## 2021-07-15 VITALS — BP 96/60 | HR 101 | Ht 70.0 in | Wt 168.0 lb

## 2021-07-15 DIAGNOSIS — N138 Other obstructive and reflux uropathy: Secondary | ICD-10-CM

## 2021-07-15 DIAGNOSIS — N401 Enlarged prostate with lower urinary tract symptoms: Secondary | ICD-10-CM

## 2021-07-15 LAB — BLADDER SCAN AMB NON-IMAGING

## 2021-07-15 NOTE — Progress Notes (Signed)
Catheter Removal  Patient is present today for a catheter removal.  60ml of water was drained from the balloon. A 16FR foley cath was removed from the bladder no complications were noted . Patient tolerated well.  Performed by: Fonnie Jarvis, CMA  Follow up/ Additional notes: will return this afternoon for a PVR

## 2021-07-15 NOTE — Progress Notes (Signed)
07/15/2021 10:38 AM   Cherly Hume [birth year] [id number]  CC: Chief Complaint  Patient presents with   Urinary Retention   HPI: George [name] is a 87 y.o. male with PMH BPH with recurrent urinary retention on alfuzosin and finasteride and elevated PSA who is currently overdue for follow-up PSA who presents today for ED follow-up and voiding trial.  He is accompanied today by his granddaughter, who contributes to HPI.  He was seen in the emergency department on 07/06/2021 with reports of the inability to urinate and bladder pain.  ED note reports "for bladder" on bladder scanning without volume.  UA at that time was notable for microscopic hematuria and rare bacteria without WBCs or nitrites.  Urine culture has since finalized with no growth.  Foley catheter was placed, no antibiotics ordered.  Foley catheter removed in the morning, see separate procedure note for details.  Patient returned for PVR in the afternoon.  He reports he has been able to urinate.  PVR 106 mL.  PMH: Past Medical History:  Diagnosis Date   Anemia    Arthritis    BPH (benign prostatic hyperplasia)    Cancer (HCC)    COLON   COPD (chronic obstructive pulmonary disease) (HCC)    Coronary artery disease    WITH 1 STENT   Diabetes mellitus without complication (HCC)    Dyspnea    WITH EXERTION ONLY   GERD (gastroesophageal reflux disease)    Hypercholesteremia    Hypertension    Pneumonia    Sleep apnea    HAD UPPP    Surgical History: Past Surgical History:  Procedure Laterality Date   CARDIAC CATHETERIZATION     has a stent   COLON SURGERY     DUE TO COLON CANCER   COLONOSCOPY     MULTIPLE   EYE SURGERY Bilateral    CATARACT   HERNIA REPAIR     REVERSE SHOULDER ARTHROPLASTY Left 02/10/2021   Procedure: REVERSE SHOULDER ARTHROPLASTY;  Surgeon: Corky Mull, MD;  Location: [facility] ORS;  Service: Orthopedics;  Laterality: Left;   TONSILLECTOMY     UVULOPALATOPHARYNGOPLASTY      Home  Medications:  Allergies as of 07/15/2021   No Known Allergies      Medication List        Accurate as of July 15, 2021 10:38 AM. If you have any questions, ask your nurse or doctor.          albuterol 108 (90 Base) MCG/ACT inhaler Commonly known as: VENTOLIN HFA Inhale 1-2 puffs into the lungs every 6 (six) hours as needed for wheezing or shortness of breath.   alfuzosin 10 MG 24 hr tablet Commonly known as: UROXATRAL Take 1 tablet (10 mg total) by mouth daily with breakfast. This replaces doxazosin   aspirin EC 81 MG tablet Take 81 mg by mouth daily. Swallow whole.   colchicine 0.6 MG tablet Take 0.5 tablets (0.3 mg total) by mouth daily.   cyanocobalamin 1000 MCG/ML injection Commonly known as: (VITAMIN B-12) Inject 1,000 mcg into the muscle every 30 (thirty) days.   finasteride 5 MG tablet Commonly known as: PROSCAR Take 1 tablet (5 mg total) by mouth daily.   hyoscyamine 0.125 MG tablet Commonly known as: LEVSIN Take 0.125 mg by mouth every 4 (four) hours as needed.   lisinopril 20 MG tablet Commonly known as: ZESTRIL Take 20 mg by mouth 2 (two) times daily.   nicotine 21 mg/24hr patch Commonly known as: NICODERM  CQ - dosed in mg/24 hours Place 1 patch (21 mg total) onto the skin daily.   omeprazole 20 MG capsule Commonly known as: PRILOSEC Take 20 mg by mouth daily.   simvastatin 20 MG tablet Commonly known as: ZOCOR Take 10 mg by mouth at bedtime.   testosterone cypionate 200 MG/ML injection Commonly known as: DEPOTESTOSTERONE CYPIONATE Inject 200 mg into the muscle every 14 (fourteen) days.   Trelegy Ellipta 100-62.5-25 MCG/INH Aepb Generic drug: Fluticasone-Umeclidin-Vilant Inhale 1 puff into the lungs daily.   verapamil 240 MG CR tablet Commonly known as: CALAN-SR Take 240 mg by mouth 2 (two) times daily.        Allergies:  No Known Allergies  Family History: Family History  Problem Relation Age of Onset   Prostate cancer Neg  Hx    Bladder Cancer Neg Hx    Kidney cancer Neg Hx     Social History:   reports that he has been smoking cigarettes. He has a 60.00 pack-year smoking history. He has never used smokeless tobacco. He reports current alcohol use. He reports that he does not use drugs.  Physical Exam: BP 96/60   Pulse (!) 101   Ht 5\' 10"  (1.778 m)   Wt 168 lb (76.2 kg)   BMI 24.11 kg/m   Constitutional:  Alert and oriented, no acute distress, nontoxic appearing HEENT: [place], AT Cardiovascular: No clubbing, cyanosis, or edema Respiratory: Normal respiratory effort, no increased work of breathing Skin: No rashes, bruises or suspicious lesions Neurologic: Grossly intact, no focal deficits, moving all 4 extremities Psychiatric: Normal mood and affect  Laboratory Data: Results for orders placed or performed in visit on 07/15/21  Bladder Scan (Post Void Residual) in office  Result Value Ref Range   Scan Result 113mL    Assessment & Plan:   1. BPH with obstruction/lower urinary tract symptoms Voiding trial passed today.  With his recurrent urinary retention.  I offered him CIC teaching versus conservative management versus follow-up with Dr. Diamantina Providence to [place].  They have elected for the latter.  We will schedule this today.  Discussed return precautions with the patient including difficulty voiding, lower abdominal pain, abdominal distention, and dysuria.  He expressed understanding. - Bladder Scan (Post Void Residual) in office   Return in about 2 weeks (around 07/29/2021) for HOLEP consultation with Dr. Diamantina Providence.  Debroah Loop, PA-C  [facility] [address], [place], [place] [address] [phone]

## 2021-07-19 ENCOUNTER — Emergency Department
Admission: EM | Admit: 2021-07-19 | Discharge: 2021-07-19 | Disposition: A | Discharge status: Home / Self Care | Payer: Medicare Other | Attending: Emergency Medicine | Admitting: Emergency Medicine

## 2021-07-19 ENCOUNTER — Other Ambulatory Visit: Payer: Self-pay

## 2021-07-19 ENCOUNTER — Encounter: Payer: Self-pay | Admitting: Emergency Medicine

## 2021-07-19 DIAGNOSIS — I129 Hypertensive chronic kidney disease with stage 1 through stage 4 chronic kidney disease, or unspecified chronic kidney disease: Secondary | ICD-10-CM | POA: Insufficient documentation

## 2021-07-19 DIAGNOSIS — N1832 Chronic kidney disease, stage 3b: Secondary | ICD-10-CM | POA: Insufficient documentation

## 2021-07-19 DIAGNOSIS — Z955 Presence of coronary angioplasty implant and graft: Secondary | ICD-10-CM | POA: Diagnosis not present

## 2021-07-19 DIAGNOSIS — N3001 Acute cystitis with hematuria: Secondary | ICD-10-CM | POA: Diagnosis not present

## 2021-07-19 DIAGNOSIS — J441 Chronic obstructive pulmonary disease with (acute) exacerbation: Secondary | ICD-10-CM | POA: Insufficient documentation

## 2021-07-19 DIAGNOSIS — I2510 Atherosclerotic heart disease of native coronary artery without angina pectoris: Secondary | ICD-10-CM | POA: Diagnosis not present

## 2021-07-19 DIAGNOSIS — F17210 Nicotine dependence, cigarettes, uncomplicated: Secondary | ICD-10-CM | POA: Diagnosis not present

## 2021-07-19 DIAGNOSIS — Z7982 Long term (current) use of aspirin: Secondary | ICD-10-CM | POA: Insufficient documentation

## 2021-07-19 DIAGNOSIS — R339 Retention of urine, unspecified: Secondary | ICD-10-CM | POA: Diagnosis present

## 2021-07-19 DIAGNOSIS — Z85038 Personal history of other malignant neoplasm of large intestine: Secondary | ICD-10-CM | POA: Insufficient documentation

## 2021-07-19 DIAGNOSIS — Z7951 Long term (current) use of inhaled steroids: Secondary | ICD-10-CM | POA: Insufficient documentation

## 2021-07-19 DIAGNOSIS — Z96612 Presence of left artificial shoulder joint: Secondary | ICD-10-CM | POA: Insufficient documentation

## 2021-07-19 DIAGNOSIS — E1122 Type 2 diabetes mellitus with diabetic chronic kidney disease: Secondary | ICD-10-CM | POA: Insufficient documentation

## 2021-07-19 DIAGNOSIS — Z79899 Other long term (current) drug therapy: Secondary | ICD-10-CM | POA: Insufficient documentation

## 2021-07-19 LAB — URINALYSIS, COMPLETE (UACMP) WITH MICROSCOPIC
Bilirubin Urine: NEGATIVE
Glucose, UA: NEGATIVE mg/dL
Hgb urine dipstick: NEGATIVE
Ketones, ur: NEGATIVE mg/dL
Nitrite: POSITIVE — AB
Protein, ur: 100 mg/dL — AB
Specific Gravity, Urine: 1.006 (ref 1.005–1.030)
Squamous Epithelial / HPF: NONE SEEN (ref 0–5)
WBC, UA: >50 WBC/hpf — ABNORMAL HIGH (ref 0–5)
pH: 6 (ref 5.0–8.0)

## 2021-07-19 MED ORDER — SODIUM CHLORIDE 0.9 % IV SOLN
1.0000 g | Freq: Once | INTRAVENOUS | Status: AC
  Administered 2021-07-19: 1 g via INTRAVENOUS
  Filled 2021-07-19: qty 10

## 2021-07-19 MED ORDER — CEPHALEXIN 500 MG PO CAPS: 500.0000 mg | ORAL_CAPSULE | Freq: Three times a day (TID) | ORAL | 0 refills | Status: AC

## 2021-07-19 MED ORDER — LIDOCAINE HCL URETHRAL/MUCOSAL 2 % EX GEL
1.0000 "application " | Freq: Once | CUTANEOUS | Status: AC
  Administered 2021-07-19: 1 via URETHRAL

## 2021-07-19 NOTE — ED Triage Notes (Signed)
[facility] - pt c/o inability to urinate. Doesn't know when the last time he urinated

## 2021-07-19 NOTE — ED Provider Notes (Signed)
[facility] Emergency Department Provider Note  ____________________________________________  Time seen: Approximately 5:33 AM  I have reviewed the triage vital signs and the nursing notes.   HISTORY  Chief Complaint Urinary Retention   HPI George [name] is a 87 y.o. male with a history of BPH and recurrent episodes of urinary retention who presents for evaluation of urinary retention.  Patient had a Foley catheter removed 5 days ago.  Was able to urinate when he went to bed last night.  Woke up this morning and was unable to urinate.  He is complaining of suprapubic distention and pain.  He denies any symptoms when he went to bed including no abdominal pain, no fever, no dysuria, no flank pain, no nausea, no vomiting.  At this time is complaining of severe lower abdominal pain, sharp and constant. He reports having some incontinence on arrival to the ED but still feels like his bladder is full and distended.  Past Medical History:  Diagnosis Date   Anemia    Arthritis    BPH (benign prostatic hyperplasia)    Cancer (HCC)    COLON   COPD (chronic obstructive pulmonary disease) ([place])    Coronary artery disease    WITH 1 STENT   Diabetes mellitus without complication (HCC)    Dyspnea    WITH EXERTION ONLY   GERD (gastroesophageal reflux disease)    Hypercholesteremia    Hypertension    Pneumonia    Sleep apnea    HAD UPPP    Patient Active Problem List   Diagnosis Date Noted   Acute on chronic respiratory failure with hypoxia ([place]) 06/08/2021   Gout 06/08/2021   Sepsis ([place]) 06/08/2021   COPD exacerbation ([place]) 06/08/2021   CAP (community acquired pneumonia) 06/08/2021   Status post reverse arthroplasty of shoulder, left 02/10/2021   Acute kidney injury superimposed on CKD IIIb ([place]) 11/04/2020   BPH with obstruction/lower urinary tract symptoms 11/04/2020   Hyperkalemia 11/04/2020   Acute urinary retention 11/04/2020   Pneumonia 06/25/2019    Foot pain 07/11/2018   Urinary urgency 06/27/2017   Erectile dysfunction 06/27/2017   Stage 3 acute kidney injury ([place]) 06/27/2017   Hypogonadism in male 06/27/2017   Vomiting 02/09/2016   Slurred speech 02/09/2016   Nausea and vomiting 02/09/2016   Lower abdominal pain 02/09/2016   Tobacco abuse 02/09/2016   Essential hypertension 02/09/2016   GERD (gastroesophageal reflux disease) 02/09/2016   Hyperlipidemia 04/22/2014   Hypertension 04/22/2014   COPD (chronic obstructive pulmonary disease) ([place]) 04/22/2014   Type II diabetes mellitus with renal manifestations ([place]) 04/22/2014   CAD (coronary artery disease) 09/10/1998    Past Surgical History:  Procedure Laterality Date   CARDIAC CATHETERIZATION     has a stent   COLON SURGERY     DUE TO COLON CANCER   COLONOSCOPY     MULTIPLE   EYE SURGERY Bilateral    CATARACT   HERNIA REPAIR     REVERSE SHOULDER ARTHROPLASTY Left 02/10/2021   Procedure: REVERSE SHOULDER ARTHROPLASTY;  Surgeon: Corky Mull, MD;  Location: [facility] ORS;  Service: Orthopedics;  Laterality: Left;   TONSILLECTOMY     UVULOPALATOPHARYNGOPLASTY      Prior to Admission medications   Medication Sig Start Date End Date Taking? Authorizing Provider  cephALEXin (KEFLEX) 500 MG capsule Take 1 capsule (500 mg total) by mouth 3 (three) times daily for 7 days. 07/19/21 07/26/21 Yes Jenniferlynn Saad, [place], MD  albuterol (VENTOLIN HFA) 108 (90  Base) MCG/ACT inhaler Inhale 1-2 puffs into the lungs every 6 (six) hours as needed for wheezing or shortness of breath.    [provider]  alfuzosin (UROXATRAL) 10 MG 24 hr tablet Take 1 tablet (10 mg total) by mouth daily with breakfast. This replaces doxazosin 12/12/20   [name], MD  aspirin EC 81 MG tablet Take 81 mg by mouth daily. Swallow whole.    [provider]  colchicine 0.6 MG tablet Take 0.5 tablets (0.3 mg total) by mouth daily. 06/17/21   Enzo Bi, MD  cyanocobalamin (,VITAMIN B-12,) 1000 MCG/ML  injection Inject 1,000 mcg into the muscle every 30 (thirty) days.    [provider]  finasteride (PROSCAR) 5 MG tablet Take 1 tablet (5 mg total) by mouth daily. 12/12/20 12/07/21  [name], MD  Fluticasone-Umeclidin-Vilant (TRELEGY ELLIPTA) 100-62.5-25 MCG/INH AEPB Inhale 1 puff into the lungs daily.    [provider]  hyoscyamine (LEVSIN) 0.125 MG tablet Take 0.125 mg by mouth every 4 (four) hours as needed.    [provider]  lisinopril (PRINIVIL,ZESTRIL) 20 MG tablet Take 20 mg by mouth 2 (two) times daily.    [provider]  nicotine (NICODERM CQ - DOSED IN MG/24 HOURS) 21 mg/24hr patch Place 1 patch (21 mg total) onto the skin daily. 06/16/21   Enzo Bi, MD  omeprazole (PRILOSEC) 20 MG capsule Take 20 mg by mouth daily.    [provider]  simvastatin (ZOCOR) 20 MG tablet Take 10 mg by mouth at bedtime.    [provider]  testosterone cypionate (DEPOTESTOSTERONE CYPIONATE) 200 MG/ML injection Inject 200 mg into the muscle every 14 (fourteen) days.    [provider]  verapamil (CALAN-SR) 240 MG CR tablet Take 240 mg by mouth 2 (two) times daily.    [provider]    Allergies Patient has no known allergies.  Family History  Problem Relation Age of Onset   Prostate cancer Neg Hx    Bladder Cancer Neg Hx    Kidney cancer Neg Hx     Social History Social History   Tobacco Use   Smoking status: Every Day    Packs/day: 1.00    Years: 60.00    Pack years: 60.00    Types: Cigarettes   Smokeless tobacco: Never  Vaping Use   Vaping Use: Never used  Substance Use Topics   Alcohol use: Yes    Comment: occ WINE   Drug use: No    Review of Systems  Constitutional: Negative for fever. Eyes: Negative for visual changes. ENT: Negative for sore throat. Neck: No neck pain  Cardiovascular: Negative for chest pain. Respiratory: Negative for shortness of breath. Gastrointestinal: +abdominal distention  and pain. No vomiting or diarrhea. Genitourinary: Negative for dysuria. Musculoskeletal: Negative for back pain. Skin: Negative for rash. Neurological: Negative for headaches, weakness or numbness. Psych: No SI or HI  ____________________________________________   PHYSICAL EXAM:  VITAL SIGNS: ED Triage Vitals [07/19/21 0426]  Enc Vitals Group     BP (!) 163/71     Pulse Rate 86     Resp 18     Temp 98.4 F (36.9 C)     Temp src      SpO2 98 %     Weight 167 lb 15.9 oz (76.2 kg)     Height 5\' 10"  (1.778 m)     Head Circumference      Peak Flow      Pain  Score 10     Pain Loc      Pain Edu?      Excl. in [place]?     Constitutional: Alert and oriented. In distress due to pain HEENT:      Head: Normocephalic and atraumatic.         Eyes: Conjunctivae are normal. Sclera is non-icteric.       Mouth/Throat: Mucous membranes are moist.       Neck: Supple with no signs of meningismus. Cardiovascular: Regular rate and rhythm. No murmurs, gallops, or rubs. 2+ symmetrical distal pulses are present in all extremities. No JVD. Respiratory: Normal respiratory effort. Lungs are clear to auscultation bilaterally.  Gastrointestinal: Soft, bladder distended and tender to palpation. Genitourinary: No CVA tenderness. Musculoskeletal:  No edema, cyanosis, or erythema of extremities. Neurologic: Normal speech and language. Face is symmetric. Moving all extremities. No gross focal neurologic deficits are appreciated. Skin: Skin is warm, dry and intact. No rash noted. Psychiatric: Mood and affect are normal. Speech and behavior are normal.  ____________________________________________   LABS (all labs ordered are listed, but only abnormal results are displayed)  Labs Reviewed  URINALYSIS, COMPLETE (UACMP) WITH MICROSCOPIC - Abnormal; Notable for the following components:      Result Value   Color, Urine YELLOW (*)    APPearance CLOUDY (*)    Protein, ur 100 (*)    Nitrite POSITIVE (*)     Leukocytes,Ua LARGE (*)    WBC, UA >50 (*)    Bacteria, UA FEW (*)    All other components within normal limits  URINE CULTURE   ____________________________________________  EKG  none  ____________________________________________  RADIOLOGY  none  ____________________________________________   PROCEDURES  Procedure(s) performed: None Procedures   Critical Care performed:  None ____________________________________________   INITIAL IMPRESSION / ASSESSMENT AND PLAN / ED COURSE  87 y.o. male with a history of BPH and recurrent episodes of urinary retention who presents for evaluation of urinary retention.  Patient symptoms started this morning.  He was able to urinate last night.  Had a Foley catheter removed 4 days ago.  Has had several Foley catheters in the past.  He is already on Flomax.  Denies any complaints after Foley was placed.  UA is positive for UTI.  No systemic symptoms, tachycardia, or fever.  Patient given a dose of Rocephin and will be discharged home on Keflex.  Recommended follow-up with urology for further evaluation and discussed my standard return precautions.  Old medical records reviewed including patient's recent visit to the urologist 5 days ago.      _____________________________________________ Please note:  Patient was evaluated in Emergency Department today for the symptoms described in the history of present illness. Patient was evaluated in the context of the global COVID-19 pandemic, which necessitated consideration that the patient might be at risk for infection with the SARS-CoV-2 virus that causes COVID-19. Institutional protocols and algorithms that pertain to the evaluation of patients at risk for COVID-19 are in a state of rapid change based on information released by regulatory bodies including the CDC and federal and state organizations. These policies and algorithms were followed during the patient's care in the ED.  Some ED evaluations  and interventions may be delayed as a result of limited staffing during the pandemic.   [place] Controlled Substance Database was reviewed by me. ____________________________________________   FINAL CLINICAL IMPRESSION(S) / ED DIAGNOSES   Final diagnoses:  Urinary retention  Acute cystitis with hematuria  NEW MEDICATIONS STARTED DURING THIS VISIT:  ED Discharge Orders          Ordered    cephALEXin (KEFLEX) 500 MG capsule  3 times daily        07/19/21 0536             Note:  This document was prepared using Dragon voice recognition software and may include unintentional dictation errors.    Rudene Re, MD 07/19/21 [phone]

## 2021-07-19 NOTE — ED Provider Notes (Signed)
-----------------------------------------   9:38 AM on 07/19/2021 ----------------------------------------- Patient calls.  Apparently the power is out at her pharmacy where the prescription went to.  She asked [place] to call to a different pharmacy.  I will call in Keflex as the patient requested.   Nena Polio, MD 07/19/21 [phone]

## 2021-07-19 NOTE — Discharge Instructions (Addendum)
Take Keflex 3 times a day for 7 days as prescribed.  Follow Foley care instructions below.  Follow-up with urology in 3 to 5 days.  Return to the emergency room for new or worsening abdominal pain, fever, flank pain, nausea or vomiting

## 2021-07-20 ENCOUNTER — Telehealth: Payer: Self-pay | Admitting: Physician Assistant

## 2021-07-20 NOTE — Telephone Encounter (Signed)
Patient's family member called the office this morning attempting to schedule a follow up appt with you in 3 days (per ER recommendation) to have foley catheter removed.   Patient is looking for advise on when it is appropriate to follow up.  Patient has appt on 10/19 with Dr. Diamantina Providence to discuss Holep.  Please advise.

## 2021-07-21 LAB — URINE CULTURE: Culture: >=100000 — AB

## 2021-07-21 NOTE — Telephone Encounter (Signed)
Given his recurrent retention, I would recommend leaving Foley in place at least until his follow-up visit with Dr. Diamantina Providence. Alternatively, we could remove Foley sooner but I'd recommend CIC teaching at that time so he can self-cath if needed, assuming he'd be willing to try.

## 2021-07-21 NOTE — Telephone Encounter (Signed)
Spoke with patients caregiver, Gregary Signs. Advised her of the message below. She agrees to leave the catheter in until patients appt on the 19th.

## 2021-07-29 ENCOUNTER — Other Ambulatory Visit: Payer: Self-pay | Admitting: Urology

## 2021-07-29 ENCOUNTER — Ambulatory Visit (INDEPENDENT_AMBULATORY_CARE_PROVIDER_SITE_OTHER): Payer: Medicare Other | Admitting: Urology

## 2021-07-29 ENCOUNTER — Other Ambulatory Visit: Payer: Self-pay

## 2021-07-29 VITALS — BP 102/64 | HR 105 | Ht 70.0 in | Wt 167.0 lb

## 2021-07-29 DIAGNOSIS — N138 Other obstructive and reflux uropathy: Secondary | ICD-10-CM

## 2021-07-29 DIAGNOSIS — N401 Enlarged prostate with lower urinary tract symptoms: Secondary | ICD-10-CM

## 2021-07-29 NOTE — Progress Notes (Signed)
   07/29/2021 9:36 AM   George [name] [birth year] [id number]  Reason for visit: Follow up BPH and urinary retention  HPI: Comorbid and frail 87 year old male with medical history notable for CAD with stent, COPD not on oxygen, diabetes, tobacco use with long history of BPH and recurrent urinary retention.  I last saw him in Mar 29, 2021 after he had passed a voiding trial with a PVR of 50 mL, and he was on maximal medical therapy with alfuzosin and finasteride.  He developed recurrent urinary retention most recently on 07/19/2021 and a Foley catheter was placed in the ER.  I had a frank conversation with the patient and his caretaker that he has required multiple ER trips for urinary retention over the last year, and I would recommend consider other alternatives for bladder management at this time.  Options would include chronic Foley changes on a monthly basis for the rest of his life, intermittent catheterization, or consideration of an outlet procedure with HOLEP.  Risks and benefits discussed extensively.  He is unwilling to continue a chronic Foley catheter, but I think he does realize that at this point he needs to do something with his recurrent episodes of retention requiring catheter placement.  He is too frail to perform intermittent catheterization, and his caregiver is unable to help with this.  We discussed the risks and benefits of HoLEP at length.  The procedure requires general anesthesia and takes 1 to 2 hours, and a holmium laser is used to enucleate the prostate and push this tissue into the bladder.  A morcellator is then used to remove this tissue, which is sent for pathology.  The vast majority(>95%) of patients are able to discharge the same day with a catheter in place for 2 to 3 days, and will follow-up in clinic for a voiding trial.  We specifically discussed the risks of bleeding, infection, retrograde ejaculation, temporary urgency and urge incontinence, very low risk of  long-term incontinence, urethral stricture/bladder neck contracture, pathologic evaluation of prostate tissue and possible detection of prostate cancer or other malignancy, and possible need for additional procedures.  Schedule HOLEP, needs cardiology clearance  [name], [facility] [address], [place], [place] [address] [phone]

## 2021-07-29 NOTE — Patient Instructions (Signed)

## 2021-07-29 NOTE — Progress Notes (Signed)
Surgical Physician Order Form  * Scheduling expectation : Next Available  *Length of Case: 2 hours  *Clearance needed: yes, cardiology  *Anticoagulation Instructions: Hold all anticoagulants  *Aspirin Instructions: Hold Aspirin   *Post-op visit Date/Instructions: Voiding trial PA 2 days, MD 10 weeks PVR  *Diagnosis: BPH w/urinary obstruction  *Procedure: HOLEP (73578)  -Admit type: OUTpatient  -Anesthesia: General  -VTE Prophylaxis Standing Order SCD's       Other:   -Standing Lab Orders Per Anesthesia    Lab other: None  -Standing Test orders EKG/Chest x-ray per Anesthesia       Test other:   - Medications:     Ancef 2gm IV   Other Instructions:

## 2021-07-30 ENCOUNTER — Telehealth: Payer: Self-pay | Admitting: Urology

## 2021-07-30 ENCOUNTER — Encounter: Payer: Self-pay | Admitting: Urology

## 2021-07-30 NOTE — Telephone Encounter (Signed)
.  Per Dr. Diamantina Providence Patient is to be scheduled for [facility]  [name] and George [name] was contacted and possible surgical dates were discussed, 08/21/21 was agreed upon for surgery. Patient was directed to call [phone] between 1-3pm the day before surgery to find out surgical arrival time.  Instructions were given not to eat or drink from midnight on the night before surgery and have a driver for the day of surgery. On the surgery day patient was instructed to enter through the [place] entrance of [facility] report the Same Day Surgery desk.   Pre-Admit Testing will be in contact via phone to set up an interview with the anesthesia team to review your history and medications prior to surgery.   Reminder of this information was sent via mail to the patient.   Patient is to hold anticoag's and ASA per Dr. Diamantina Providence.  Request Cardiac Clearance.

## 2021-08-20 ENCOUNTER — Telehealth: Payer: Self-pay

## 2021-08-20 ENCOUNTER — Other Ambulatory Visit: Payer: Self-pay

## 2021-08-20 ENCOUNTER — Encounter
Admission: RE | Admit: 2021-08-20 | Discharge: 2021-08-20 | Disposition: A | Discharge status: Home / Self Care | Payer: Medicare Other | Source: Ambulatory Visit | Attending: Urology | Admitting: Urology

## 2021-08-20 HISTORY — DX: Unspecified hearing loss, unspecified ear: H91.90

## 2021-08-20 MED ORDER — CHLORHEXIDINE GLUCONATE 0.12 % MT SOLN
15.0000 mL | Freq: Once | OROMUCOSAL | Status: AC
  Administered 2021-08-21: 15 mL via OROMUCOSAL

## 2021-08-20 MED ORDER — LACTATED RINGERS IV SOLN: INTRAVENOUS | Status: DC

## 2021-08-20 MED ORDER — CEPHALEXIN 500 MG PO CAPS: 500.0000 mg | ORAL_CAPSULE | Freq: Two times a day (BID) | ORAL | 0 refills | Status: AC

## 2021-08-20 MED ORDER — ORAL CARE MOUTH RINSE: 15.0000 mL | Freq: Once | OROMUCOSAL | Status: AC

## 2021-08-20 MED ORDER — CEFAZOLIN SODIUM-DEXTROSE 2-4 GM/100ML-% IV SOLN
2.0000 g | INTRAVENOUS | Status: AC
  Administered 2021-08-21: 2 g via INTRAVENOUS

## 2021-08-20 NOTE — Progress Notes (Signed)
[facility] Posting Form   Surgery Date/Time: Date: 08/21/2021  Surgeon: Dr. Nickolas Madrid, MD  Surgery Location: Day Surgery  Inpt ( No  )   Outpt (Yes)   Obs ( No  )   Diagnosis: N40.1, N13.8; BPH with urinary obstruction  -CPT: 26333  Surgery: Benign Prostatic Hyperplasia with Obstruction  Stop Anticoagulations: Yes, Hold ASA  *Orders entered into EPIC  Date: 08/20/21   *Case booked in [place]  Date: 07/30/2021  *Notified pt of Surgery: Date: 07/30/2021  PRE-OP UA & CX: no   *Placed into Prior Authorization Work Que Date: 08/20/21   Assistant/laser/rep:No

## 2021-08-20 NOTE — Patient Instructions (Addendum)
Your procedure is scheduled on:08-21-21 Friday Report to the Registration Desk on the 1st floor of the [facility].Then proceed to the 2nd floor Surgery Desk in the [facility] at 6 AM  REMEMBER: Instructions that are not followed completely may result in serious medical risk, up to and including death; or upon the discretion of your surgeon and anesthesiologist your surgery may need to be rescheduled.  Do not eat food OR drink any liquids after midnight the night before surgery.  No gum chewing, lozengers or hard candies.  TAKE THESE MEDICATIONS THE MORNING OF SURGERY WITH A SIP OF WATER: -alfuzosin (UROXATRAL) 10 MG 24 hr tablet -cephALEXin (KEFLEX) 500 MG capsule -finasteride (PROSCAR) 5 MG tablet -verapamil (CALAN-SR) 240 MG CR tablet -omeprazole (PRILOSEC) 20 MG capsule-take one the night before and one on the morning of surgery - helps to prevent nausea after surgery.)  Use your Fluticasone-Umeclidin-Vilant (TRELEGY ELLIPTA) 100-62.5-25 MCG/INH AEPB and your albuterol (VENTOLIN HFA) 108 (90 Base) MCG/ACT inhaler the morning of surgery and bring your Albuterol Inhaler to the hospital  Call [facility] office today to find out if you need to take your aspirin EC 81 MG tablet today  One week prior to surgery: Stop Anti-inflammatories (NSAIDS) such as Advil, Aleve, Ibuprofen, Motrin, Naproxen, Naprosyn and Aspirin based products such as Excedrin, Goodys Powder, BC Powder-You may however, continue to take Tylenol if needed for pain up until the day of surgery.  No Alcohol for 24 hours before or after surgery.  No Smoking including e-cigarettes for 24 hours prior to surgery.  No chewable tobacco products for at least 6 hours prior to surgery.  No nicotine patches on the day of surgery.  Do not use any "recreational" drugs for at least a week prior to your surgery.  Please be advised that the combination of cocaine and anesthesia may have negative outcomes, up to and including  death. If you test positive for cocaine, your surgery will be cancelled.  On the morning of surgery brush your teeth with toothpaste and water, you may rinse your mouth with mouthwash if you wish. Do not swallow any toothpaste or mouthwash.  Do not wear jewelry, make-up, hairpins, clips or nail polish.  Do not wear lotions, powders, or perfumes.   Do not shave body from the neck down 48 hours prior to surgery just in case you cut yourself which could leave a site for infection.  Also, freshly shaved skin may become irritated if using the CHG soap.  Contact lenses, hearing aids and dentures may not be worn into surgery.  Do not bring valuables to the hospital. [facility] is not responsible for any missing/lost belongings or valuables.   Notify your doctor if there is any change in your medical condition (cold, fever, infection).  Wear comfortable clothing (specific to your surgery type) to the hospital.  After surgery, you can help prevent lung complications by doing breathing exercises.  Take deep breaths and cough every 1-2 hours. Your doctor may order a device called an Incentive Spirometer to help you take deep breaths. When coughing or sneezing, hold a pillow firmly against your incision with both hands. This is called "splinting." Doing this helps protect your incision. It also decreases belly discomfort.  If you are being admitted to the hospital overnight, leave your suitcase in the car. After surgery it may be brought to your room.  If you are being discharged the day of surgery, you will not be allowed to drive home. You will  need a responsible adult (18 years or older) to drive you home and stay with you that night.   If you are taking public transportation, you will need to have a responsible adult (18 years or older) with you. Please confirm with your physician that it is acceptable to use public transportation.   Please call the [facility] Dept. at [phone] if you have any questions about these instructions.  Surgery Visitation Policy:  Patients undergoing a surgery or procedure may have one family member or support person with them as long as that person is not COVID-19 positive or experiencing its symptoms.  That person may remain in the waiting area during the procedure and may rotate out with other people.  Inpatient Visitation:    Visiting hours are 7 a.m. to 8 p.m. Up to two visitors ages 16+ are allowed at one time in a patient room. The visitors may rotate out with other people during the day. Visitors must check out when they leave, or other visitors will not be allowed. One designated support person may remain overnight. The visitor must pass COVID-19 screenings, use hand sanitizer when entering and exiting the patient's room and wear a mask at all times, including in the patient's room. Patients must also wear a mask when staff or their visitor are in the room. Masking is required regardless of vaccination status.

## 2021-08-20 NOTE — Progress Notes (Signed)
Spoke with pt. Caregiver about need to start antibiotic prior to surgery to sterilize urine. Verbalized understanding. Medication has been sent to [facility] per caregivers request.

## 2021-08-21 ENCOUNTER — Other Ambulatory Visit: Payer: Self-pay

## 2021-08-21 ENCOUNTER — Encounter
Admission: RE | Disposition: A | Discharge status: Home / Self Care | Payer: Self-pay | Source: Home / Self Care | Attending: Urology

## 2021-08-21 ENCOUNTER — Ambulatory Visit: Payer: Medicare Other | Admitting: Urgent Care

## 2021-08-21 ENCOUNTER — Encounter: Payer: Self-pay | Admitting: Urology

## 2021-08-21 ENCOUNTER — Ambulatory Visit
Admission: RE | Admit: 2021-08-21 | Discharge: 2021-08-21 | Disposition: A | Discharge status: Home / Self Care | Payer: Medicare Other | Attending: Urology | Admitting: Urology

## 2021-08-21 DIAGNOSIS — N401 Enlarged prostate with lower urinary tract symptoms: Secondary | ICD-10-CM | POA: Insufficient documentation

## 2021-08-21 DIAGNOSIS — Z79899 Other long term (current) drug therapy: Secondary | ICD-10-CM | POA: Insufficient documentation

## 2021-08-21 DIAGNOSIS — N138 Other obstructive and reflux uropathy: Secondary | ICD-10-CM | POA: Insufficient documentation

## 2021-08-21 DIAGNOSIS — F17210 Nicotine dependence, cigarettes, uncomplicated: Secondary | ICD-10-CM | POA: Insufficient documentation

## 2021-08-21 DIAGNOSIS — G4730 Sleep apnea, unspecified: Secondary | ICD-10-CM | POA: Diagnosis not present

## 2021-08-21 DIAGNOSIS — I10 Essential (primary) hypertension: Secondary | ICD-10-CM | POA: Insufficient documentation

## 2021-08-21 DIAGNOSIS — J449 Chronic obstructive pulmonary disease, unspecified: Secondary | ICD-10-CM | POA: Diagnosis not present

## 2021-08-21 DIAGNOSIS — E119 Type 2 diabetes mellitus without complications: Secondary | ICD-10-CM | POA: Insufficient documentation

## 2021-08-21 DIAGNOSIS — R338 Other retention of urine: Secondary | ICD-10-CM | POA: Insufficient documentation

## 2021-08-21 DIAGNOSIS — I2510 Atherosclerotic heart disease of native coronary artery without angina pectoris: Secondary | ICD-10-CM | POA: Insufficient documentation

## 2021-08-21 DIAGNOSIS — Z955 Presence of coronary angioplasty implant and graft: Secondary | ICD-10-CM | POA: Insufficient documentation

## 2021-08-21 DIAGNOSIS — K219 Gastro-esophageal reflux disease without esophagitis: Secondary | ICD-10-CM | POA: Diagnosis not present

## 2021-08-21 HISTORY — PX: HOLEP-LASER ENUCLEATION OF THE PROSTATE WITH MORCELLATION: SHX6641

## 2021-08-21 SURGERY — ENUCLEATION, PROSTATE, USING LASER, WITH MORCELLATION
Anesthesia: General | Site: Prostate

## 2021-08-21 MED ORDER — PHENYLEPHRINE HCL (PRESSORS) 10 MG/ML IV SOLN
INTRAVENOUS | Status: AC
  Filled 2021-08-21: qty 1

## 2021-08-21 MED ORDER — ONDANSETRON HCL 4 MG/2ML IJ SOLN
INTRAMUSCULAR | Status: DC | PRN
  Administered 2021-08-21: 4 mg via INTRAVENOUS

## 2021-08-21 MED ORDER — PROPOFOL 10 MG/ML IV BOLUS
INTRAVENOUS | Status: AC
  Filled 2021-08-21: qty 20

## 2021-08-21 MED ORDER — PHENYLEPHRINE HCL-NACL 20-0.9 MG/250ML-% IV SOLN
INTRAVENOUS | Status: DC | PRN
  Administered 2021-08-21: 30 ug/min via INTRAVENOUS

## 2021-08-21 MED ORDER — CHLORHEXIDINE GLUCONATE 0.12 % MT SOLN
OROMUCOSAL | Status: AC
  Filled 2021-08-21: qty 15

## 2021-08-21 MED ORDER — SUGAMMADEX SODIUM 200 MG/2ML IV SOLN
INTRAVENOUS | Status: DC | PRN
  Administered 2021-08-21: 300 mg via INTRAVENOUS

## 2021-08-21 MED ORDER — EPHEDRINE SULFATE 50 MG/ML IJ SOLN
INTRAMUSCULAR | Status: DC | PRN
  Administered 2021-08-21: 5 mg via INTRAVENOUS
  Administered 2021-08-21: 10 mg via INTRAVENOUS
  Administered 2021-08-21: 5 mg via INTRAVENOUS

## 2021-08-21 MED ORDER — ROCURONIUM BROMIDE 100 MG/10ML IV SOLN
INTRAVENOUS | Status: DC | PRN
Start: 2021-08-21 — End: 2021-08-21
  Administered 2021-08-21: 20 mg via INTRAVENOUS
  Administered 2021-08-21 (×2): 10 mg via INTRAVENOUS
  Administered 2021-08-21: 30 mg via INTRAVENOUS

## 2021-08-21 MED ORDER — BELLADONNA ALKALOIDS-OPIUM 16.2-60 MG RE SUPP
RECTAL | Status: DC | PRN
  Administered 2021-08-21: 1 via RECTAL

## 2021-08-21 MED ORDER — ONDANSETRON HCL 4 MG/2ML IJ SOLN: 4.0000 mg | Freq: Once | INTRAMUSCULAR | Status: DC | PRN

## 2021-08-21 MED ORDER — PHENYLEPHRINE HCL (PRESSORS) 10 MG/ML IV SOLN
INTRAVENOUS | Status: DC | PRN
  Administered 2021-08-21: 150 ug via INTRAVENOUS
  Administered 2021-08-21: 50 ug via INTRAVENOUS

## 2021-08-21 MED ORDER — FENTANYL CITRATE (PF) 100 MCG/2ML IJ SOLN
INTRAMUSCULAR | Status: DC | PRN
  Administered 2021-08-21: 50 ug via INTRAVENOUS
  Administered 2021-08-21 (×2): 25 ug via INTRAVENOUS

## 2021-08-21 MED ORDER — BELLADONNA ALKALOIDS-OPIUM 16.2-60 MG RE SUPP
RECTAL | Status: AC
  Filled 2021-08-21: qty 1

## 2021-08-21 MED ORDER — ESMOLOL HCL 100 MG/10ML IV SOLN
INTRAVENOUS | Status: DC | PRN
  Administered 2021-08-21: 10 mg via INTRAVENOUS

## 2021-08-21 MED ORDER — CEFAZOLIN SODIUM-DEXTROSE 2-4 GM/100ML-% IV SOLN
INTRAVENOUS | Status: AC
  Filled 2021-08-21: qty 100

## 2021-08-21 MED ORDER — PROPOFOL 10 MG/ML IV BOLUS
INTRAVENOUS | Status: DC | PRN
  Administered 2021-08-21 (×2): 30 mg via INTRAVENOUS
  Administered 2021-08-21: 40 mg via INTRAVENOUS

## 2021-08-21 MED ORDER — SODIUM CHLORIDE 0.9 % IR SOLN
Status: DC | PRN
  Administered 2021-08-21 (×3): 3000 mL
  Administered 2021-08-21 (×2): 6000 mL
  Administered 2021-08-21 (×2): 3000 mL

## 2021-08-21 MED ORDER — FENTANYL CITRATE (PF) 100 MCG/2ML IJ SOLN
INTRAMUSCULAR | Status: AC
  Filled 2021-08-21: qty 2

## 2021-08-21 MED ORDER — LIDOCAINE HCL (CARDIAC) PF 100 MG/5ML IV SOSY
PREFILLED_SYRINGE | INTRAVENOUS | Status: DC | PRN
  Administered 2021-08-21: 50 mg via INTRAVENOUS

## 2021-08-21 MED ORDER — DEXAMETHASONE SODIUM PHOSPHATE 10 MG/ML IJ SOLN
INTRAMUSCULAR | Status: DC | PRN
  Administered 2021-08-21: 8 mg via INTRAVENOUS

## 2021-08-21 MED ORDER — HYDROCODONE-ACETAMINOPHEN 5-325 MG PO TABS: 1.0000 | ORAL_TABLET | Freq: Four times a day (QID) | ORAL | 0 refills | Status: AC | PRN

## 2021-08-21 MED ORDER — FENTANYL CITRATE (PF) 100 MCG/2ML IJ SOLN: 25.0000 ug | INTRAMUSCULAR | Status: DC | PRN

## 2021-08-21 SURGICAL SUPPLY — 38 items
ADAPTER IRRIG TUBE 2 SPIKE SOL (ADAPTER) ×4 IMPLANT
ADPR TBG 2 SPK PMP STRL ASCP (ADAPTER) ×2
BAG DRN LRG CPC RND TRDRP CNTR (MISCELLANEOUS) ×1
BAG URO DRAIN 4000ML (MISCELLANEOUS) ×2 IMPLANT
CATH FOLEY 3WAY 30CC 24FR (CATHETERS) ×2
CATH URETL OPEN 5X70 (CATHETERS) ×2 IMPLANT
CATH URTH STD 24FR FL 3W 2 (CATHETERS) ×1 IMPLANT
CONTAINER COLLECT MORCELLATR (MISCELLANEOUS) ×1 IMPLANT
DRAPE UTILITY 15X26 TOWEL STRL (DRAPES) IMPLANT
ELECT BIVAP BIPO 22/24 DONUT (ELECTROSURGICAL)
ELECTRD BIVAP BIPO 22/24 DONUT (ELECTROSURGICAL) IMPLANT
FIBER LASER MOSES 550 DFL (Laser) ×2 IMPLANT
FILTER OVERFLOW MORCELLATOR (FILTER) ×1 IMPLANT
GAUZE 4X4 16PLY ~~LOC~~+RFID DBL (SPONGE) ×4 IMPLANT
GLOVE SURG UNDER POLY LF SZ7.5 (GLOVE) ×2 IMPLANT
GOWN STRL REUS W/ TWL LRG LVL3 (GOWN DISPOSABLE) ×1 IMPLANT
GOWN STRL REUS W/ TWL XL LVL3 (GOWN DISPOSABLE) ×1 IMPLANT
GOWN STRL REUS W/TWL LRG LVL3 (GOWN DISPOSABLE) ×2
GOWN STRL REUS W/TWL XL LVL3 (GOWN DISPOSABLE) ×2
HOLDER FOLEY CATH W/STRAP (MISCELLANEOUS) ×2 IMPLANT
IV NS IRRIG 3000ML ARTHROMATIC (IV SOLUTION) ×22 IMPLANT
KIT TURNOVER CYSTO (KITS) ×2 IMPLANT
MANIFOLD NEPTUNE II (INSTRUMENTS) IMPLANT
MBRN O SEALING YLW 17 FOR INST (MISCELLANEOUS) ×2
MEMBRANE SLNG YLW 17 FOR INST (MISCELLANEOUS) ×1 IMPLANT
MORCELLATOR COLLECT CONTAINER (MISCELLANEOUS) ×2
MORCELLATOR OVERFLOW FILTER (FILTER) ×2
MORCELLATOR ROTATION 4.75 335 (MISCELLANEOUS) ×2 IMPLANT
PACK CYSTO AR (MISCELLANEOUS) ×2 IMPLANT
SET CYSTO W/LG BORE CLAMP LF (SET/KITS/TRAYS/PACK) ×2 IMPLANT
SET IRRIG Y TYPE TUR BLADDER L (SET/KITS/TRAYS/PACK) ×2 IMPLANT
SLEEVE PROTECTION STRL DISP (MISCELLANEOUS) ×4 IMPLANT
SURGILUBE 2OZ TUBE FLIPTOP (MISCELLANEOUS) ×2 IMPLANT
SYR TOOMEY IRRIG 70ML (MISCELLANEOUS) ×2
SYRINGE TOOMEY IRRIG 70ML (MISCELLANEOUS) ×1 IMPLANT
TUBE PUMP MORCELLATOR PIRANHA (TUBING) ×2 IMPLANT
WATER STERILE IRR 1000ML POUR (IV SOLUTION) ×2 IMPLANT
WATER STERILE IRR 500ML POUR (IV SOLUTION) ×2 IMPLANT

## 2021-08-21 NOTE — Anesthesia Preprocedure Evaluation (Addendum)
Anesthesia Evaluation  Patient identified by MRN, date of birth, ID band Patient awake    Reviewed: Allergy & Precautions, NPO status , Patient's Chart, lab work & pertinent test results  History of Anesthesia Complications Negative for: history of anesthetic complications  Airway Mallampati: II       Dental  (+) Dental Advidsory Given, Poor Dentition   Pulmonary sleep apnea , pneumonia (2 months ago), resolved, COPD,  COPD inhaler, neg recent URI, Current Smoker and Patient abstained from smoking.,           Cardiovascular Exercise Tolerance: Good hypertension, Pt. on medications (-) angina+ CAD  (-) Past MI and (-) CHF (-) dysrhythmias (-) Valvular Problems/Murmurs     Neuro/Psych neg Seizures PSYCHIATRIC DISORDERS Dementia    GI/Hepatic Neg liver ROS, GERD  Medicated,  Endo/Other  diabetes, Type 2  Renal/GU Renal InsufficiencyRenal disease     Musculoskeletal   Abdominal   Peds  Hematology   Anesthesia Other Findings Past Medical History: No date: Anemia No date: Arthritis No date: BPH (benign prostatic hyperplasia) No date: Cancer ([place])     Comment:  COLON No date: COPD (chronic obstructive pulmonary disease) (HCC) No date: Coronary artery disease     Comment:  WITH 1 STENT No date: Dyspnea     Comment:  WITH EXERTION ONLY No date: GERD (gastroesophageal reflux disease) No date: HOH (hard of hearing) No date: Hypercholesteremia No date: Hypertension 06/2021: Pneumonia No date: Sleep apnea     Comment:  HAD UPPP   Reproductive/Obstetrics                            Anesthesia Physical  Anesthesia Plan  ASA: III  Anesthesia Plan: General   Post-op Pain Management:    Induction: Intravenous  PONV Risk Score and Plan: 1 and Ondansetron, Dexamethasone and Treatment may vary due to age or medical condition  Airway Management Planned: LMA and Oral ETT  Additional  Equipment:   Intra-op Plan:   Post-operative Plan: Extubation in OR  Informed Consent: I have reviewed the patients History and Physical, chart, labs and discussed the procedure including the risks, benefits and alternatives for the proposed anesthesia with the patient or authorized representative who has indicated his/her understanding and acceptance.       Plan Discussed with:   Anesthesia Plan Comments:         Anesthesia Quick Evaluation

## 2021-08-21 NOTE — Anesthesia Procedure Notes (Signed)
Procedure Name: Intubation Date/Time: 08/21/2021 7:35 AM Performed by: Lerry Liner, CRNA Pre-anesthesia Checklist: Patient identified, Emergency Drugs available, Suction available and Patient being monitored Patient Re-evaluated:Patient Re-evaluated prior to induction Oxygen Delivery Method: Circle system utilized Preoxygenation: Pre-oxygenation with 100% oxygen Induction Type: IV induction Ventilation: Mask ventilation without difficulty Laryngoscope Size: McGraph and 4 Grade View: Grade I Tube type: Oral Tube size: 7.5 mm Number of attempts: 1 Airway Equipment and Method: Stylet and Oral airway Placement Confirmation: ETT inserted through vocal cords under direct vision, positive ETCO2 and breath sounds checked- equal and bilateral Secured at: 22 cm Tube secured with: Tape Dental Injury: Teeth and Oropharynx as per pre-operative assessment

## 2021-08-21 NOTE — Op Note (Signed)
Date of procedure: 08/21/21  Preoperative diagnosis:  BPH with obstruction  Postoperative diagnosis:  Same  Procedure: HoLEP (Holmium Laser Enucleation of the Prostate)  Surgeon: Nickolas Madrid, MD  Anesthesia: General  Complications: None  Intraoperative findings:  Large prostate with obstructive lateral and median lobes, moderate trabeculations, no suspicious lesions Uncomplicated HOLEP, excellent hemostasis, ureteral orifices and verumontanum intact at conclusion of case  EBL: 10 mL  Specimens: Prostate chips  Enucleation time: 36 minutes  Morcellation time: 21 minutes  Intra-op weight: 65 g  Drains: 24 French three-way, 60 cc in balloon  Indication: George [name] is a 87 y.o. patient with recurrent Foley dependent urinary retention despite maximal medical therapy who ultimately opted for HOLEP.  After reviewing the management options for treatment, they elected to proceed with the above surgical procedure(s). We have discussed the potential benefits and risks of the procedure, side effects of the proposed treatment, the likelihood of the patient achieving the goals of the procedure, and any potential problems that might occur during the procedure or recuperation.  We specifically discussed the risks of bleeding, infection, hematuria and clot retention, need for additional procedures, possible overnight hospital stay, temporary urgency and incontinence, rare long-term incontinence, and retrograde ejaculation.  Informed consent has been obtained.   Description of procedure:  The patient was taken to the operating room and general anesthesia was induced.  The patient was placed in the dorsal lithotomy position, prepped and draped in the usual sterile fashion, and preoperative antibiotics(Ancef) were administered.  SCDs were placed for DVT prophylaxis.  A preoperative time-out was performed.   Leander Rams sounds were used to gently dilated the urethra up to 61F. The 6 French  continuous flow resectoscope was inserted into the urethra using the visual obturator  The prostate was large with obstructing lateral and median lobes. The bladder was thoroughly inspected and notable for moderate trabeculations but no suspicious lesions.  The ureteral orifices were located in orthotopic position.  The laser was set to 2 J and 60 Hz and was used to make a lambda incision just proximal to the verumontanum down to the level of the capsule.  A 5 and 7 o'clock incision was then made down to the level of the capsule from the bladder neck to the verumontanum, and the median lobe was enucleated into the bladder.  The lateral lobes were then incised circumferentially until they were disconnected from the surrounding tissue.  The capsule was examined and laser was used for meticulous hemostasis.    The 53 French resectoscope was then switched out for the 41 French nephroscope and the lobes were morcellated and the tissue sent to pathology.  A 24 French three-way catheter was inserted easily, and 60 cc were placed in the balloon.  Urine was faint pink.  The catheter irrigated easily with a Toomey syringe.  CBI was initiated. A belladonna suppository was placed.  The patient tolerated the procedure well without any immediate complications and was extubated and transferred to the recovery room in stable condition.  Urine was clear on fast CBI.  Disposition: Stable to PACU  Plan: Wean CBI in PACU, anticipate discharge home today with voiding trial in clinic in 2-3 days Finish previously prescribed course of Keflex  Nickolas Madrid, MD 08/21/2021

## 2021-08-21 NOTE — Discharge Instructions (Addendum)
AMBULATORY SURGERY  ?DISCHARGE INSTRUCTIONS ? ? ?The drugs that you were given will stay in your system until tomorrow so for the next 24 hours you should not: ? ?Drive an automobile ?Make any legal decisions ?Drink any alcoholic beverage ? ? ?You may resume regular meals tomorrow.  Today it is better to start with liquids and gradually work up to solid foods. ? ?You may eat anything you prefer, but it is better to start with liquids, then soup and crackers, and gradually work up to solid foods. ? ? ?Please notify your doctor immediately if you have any unusual bleeding, trouble breathing, redness and pain at the surgery site, drainage, fever, or pain not relieved by medication. ? ? ? ?Additional Instructions: ? ? ? ?Please contact your physician with any problems or Same Day Surgery at [phone], Monday through Friday 6 am to 4 pm, or [facility] Main number at [phone].  ?

## 2021-08-21 NOTE — Transfer of Care (Signed)
Immediate Anesthesia Transfer of Care Note  Patient: George [name]  Procedure(s) Performed: HOLEP-LASER ENUCLEATION OF THE PROSTATE WITH MORCELLATION (Prostate)  Patient Location: PACU  Anesthesia Type:General  Level of Consciousness: drowsy  Airway & Oxygen Therapy: Patient Spontanous Breathing and Patient connected to face mask oxygen  Post-op Assessment: Report given to RN  Post vital signs: stable  Last Vitals:  Vitals Value Taken Time  BP    Temp    Pulse    Resp    SpO2      Last Pain:  Vitals:   08/21/21 0654  TempSrc: Temporal  PainSc: 3       Patients Stated Pain Goal: 0 (07/13/48 6116)  Complications: No notable events documented.

## 2021-08-21 NOTE — H&P (Signed)
08/21/21 7:07 AM   George [name] [birth year] [id number]  CC: BPH and urinary retention  HPI: Comorbid and frail 87 year old male with medical history notable for CAD with stent, COPD not on oxygen, diabetes, tobacco use with long history of BPH and recurrent urinary retention.    He has had multiple recurrent episodes of urinary retention requiring ER visits and Foley catheter placement despite maximal medical therapy.  He refused long-term monthly catheter changes, and is unable to perform intermittent catheterization, and opted for HOLEP.   Cardiology clearance obtained from Dr. Ubaldo Glassing.   PMH: Past Medical History:  Diagnosis Date   Anemia    Arthritis    BPH (benign prostatic hyperplasia)    Cancer (HCC)    COLON   COPD (chronic obstructive pulmonary disease) (HCC)    Coronary artery disease    WITH 1 STENT   Dyspnea    WITH EXERTION ONLY   GERD (gastroesophageal reflux disease)    HOH (hard of hearing)    Hypercholesteremia    Hypertension    Pneumonia 06/2021   Sleep apnea    HAD UPPP    Surgical History: Past Surgical History:  Procedure Laterality Date   CARDIAC CATHETERIZATION     has a stent   COLON SURGERY     DUE TO COLON CANCER   COLONOSCOPY     MULTIPLE   EYE SURGERY Bilateral    CATARACT   HERNIA REPAIR     REVERSE SHOULDER ARTHROPLASTY Left 02/10/2021   Procedure: REVERSE SHOULDER ARTHROPLASTY;  Surgeon: Corky Mull, MD;  Location: [facility] ORS;  Service: Orthopedics;  Laterality: Left;   TONSILLECTOMY     UVULOPALATOPHARYNGOPLASTY       Family History: Family History  Problem Relation Age of Onset   Prostate cancer Neg Hx    Bladder Cancer Neg Hx    Kidney cancer Neg Hx     Social History:  reports that he has been smoking cigarettes. He has a 90.00 pack-year smoking history. He has never used smokeless tobacco. He reports that he does not currently use alcohol. He reports that he does not use drugs.  Physical Exam: BP 127/77   Pulse  (!) 104   Temp (!) 97.3 F (36.3 C) (Temporal)   Resp 20   Ht 5\' 10"  (1.778 m)   Wt 77.1 kg   SpO2 97%   BMI 24.39 kg/m    Constitutional:  Alert and oriented, No acute distress. Cardiovascular: Regular rate and rhythm Respiratory: Clear to auscultation bilaterally. GI: Abdomen is soft, nontender, nondistended, no abdominal masses   Laboratory Data: Preop urine culture with E. coli, treated with Keflex  Assessment & Plan:   Frail 87 year old male with multiple episodes of urinary retention despite maximal medical therapy, prostate measures 130 g on CT.  He refused long-term Foley catheter changes, and is unable to perform intermittent catheterization, and opted for HOLEP.  We discussed the risks and benefits of HoLEP at length.  The procedure requires general anesthesia and takes 1 to 2 hours, and a holmium laser is used to enucleate the prostate and push this tissue into the bladder.  A morcellator is then used to remove this tissue, which is sent for pathology.  The vast majority(>95%) of patients are able to discharge the same day with a catheter in place for 2 to 3 days, and will follow-up in clinic for a voiding trial.  We specifically discussed the risks of bleeding, infection, retrograde ejaculation, temporary urgency and  urge incontinence, risk of long-term incontinence, urethral stricture/bladder neck contracture, pathologic evaluation of prostate tissue and possible detection of prostate cancer or other malignancy, and possible need for additional procedures.  HOLEP today  Nickolas Madrid, MD 08/21/2021  [facility] [address], [place], [place] [address] [phone]

## 2021-08-21 NOTE — Anesthesia Postprocedure Evaluation (Signed)
Anesthesia Post Note  Patient: George [name]  Procedure(s) Performed: HOLEP-LASER ENUCLEATION OF THE PROSTATE WITH MORCELLATION (Prostate)  Patient location during evaluation: PACU Anesthesia Type: General Level of consciousness: awake and alert Pain management: pain level controlled Vital Signs Assessment: post-procedure vital signs reviewed and stable Respiratory status: spontaneous breathing, nonlabored ventilation, respiratory function stable and patient connected to nasal cannula oxygen Cardiovascular status: blood pressure returned to baseline and stable Postop Assessment: no apparent nausea or vomiting Anesthetic complications: no   No notable events documented.   Last Vitals:  Vitals:   08/21/21 1022 08/21/21 1136  BP: (!) 98/56 114/64  Pulse: 80 81  Resp: 18 18  Temp:  (!) 36.2 C  SpO2: 93% 94%    Last Pain:  Vitals:   08/21/21 1136  TempSrc: Temporal  PainSc: 0-No pain                 Brett Canales [name]

## 2021-08-22 ENCOUNTER — Encounter: Payer: Self-pay | Admitting: Urology

## 2021-08-24 ENCOUNTER — Ambulatory Visit: Payer: Medicare Other | Admitting: Physician Assistant

## 2021-08-24 ENCOUNTER — Ambulatory Visit (INDEPENDENT_AMBULATORY_CARE_PROVIDER_SITE_OTHER): Payer: Medicare Other | Admitting: Physician Assistant

## 2021-08-24 ENCOUNTER — Other Ambulatory Visit: Payer: Self-pay

## 2021-08-24 DIAGNOSIS — R339 Retention of urine, unspecified: Secondary | ICD-10-CM | POA: Diagnosis not present

## 2021-08-24 LAB — BLADDER SCAN AMB NON-IMAGING

## 2021-08-24 LAB — SURGICAL PATHOLOGY

## 2021-08-24 NOTE — Progress Notes (Signed)
Catheter Removal  Patient is present today for a catheter removal.  76ml of water was drained from the balloon. A 24 FR 3way foley cath was removed from the bladder no complications were noted . Patient tolerated well.  Performed by: Fonnie Jarvis, CMA  Follow up/ Additional notes: return for PM PVR

## 2021-08-24 NOTE — Patient Instructions (Signed)

## 2021-08-24 NOTE — Progress Notes (Signed)
Afternoon follow-up  Patient returned to clinic this afternoon for repeat PVR. He has been able to urinate. He has not had urinary leakage. PVR 35mL.  Results for orders placed or performed in visit on 08/24/21  Bladder Scan (Post Void Residual) in office  Result Value Ref Range   Scan Result 36mL     Voiding trial passed. Counseled patient on normal postoperative findings including dysuria, gross hematuria, and urinary leakage.  Written copies of these instructions provided as well.  Shared negative surgical pathology results with the patient.  Follow up: Postop follow-up with Dr. Diamantina Providence, already scheduled

## 2021-10-08 ENCOUNTER — Ambulatory Visit: Payer: Medicare Other | Admitting: Urology

## 2021-10-21 ENCOUNTER — Other Ambulatory Visit: Payer: Self-pay

## 2021-10-21 ENCOUNTER — Encounter: Payer: Self-pay | Admitting: Urology

## 2021-10-21 ENCOUNTER — Ambulatory Visit (INDEPENDENT_AMBULATORY_CARE_PROVIDER_SITE_OTHER): Payer: Medicare Other | Admitting: Urology

## 2021-10-21 VITALS — BP 128/76 | HR 96 | Ht 70.0 in | Wt 170.0 lb

## 2021-10-21 DIAGNOSIS — N138 Other obstructive and reflux uropathy: Secondary | ICD-10-CM

## 2021-10-21 DIAGNOSIS — N401 Enlarged prostate with lower urinary tract symptoms: Secondary | ICD-10-CM

## 2021-10-21 LAB — BLADDER SCAN AMB NON-IMAGING

## 2021-10-21 NOTE — Progress Notes (Signed)
° °  10/21/2021 11:08 AM   George [name] 1932/06/28 [id number]  Reason for visit: Follow up BPH and urinary retention  HPI: Very frail 88 year old male with BPH and Foley dependent urinary retention, who ultimately opted for HOLEP on 08/21/2021 with removal of 70 g of benign tissue.  He has been voiding spontaneously since that time.  He is having some urgency still, but minimal incontinence.  He is very satisfied with his urinary symptoms, and PVR is normal at 0 mL.  RTC 1 year PVR  [name], MD  [facility] [address], [place], [place] [address] [phone]

## 2021-12-20 ENCOUNTER — Ambulatory Visit: Payer: Self-pay

## 2021-12-21 ENCOUNTER — Other Ambulatory Visit: Payer: Self-pay

## 2021-12-21 ENCOUNTER — Ambulatory Visit
Admission: RE | Admit: 2021-12-21 | Discharge: 2021-12-21 | Disposition: A | Discharge status: Home / Self Care | Payer: Medicare Other | Source: Ambulatory Visit | Attending: Physician Assistant | Admitting: Physician Assistant

## 2021-12-21 ENCOUNTER — Ambulatory Visit (INDEPENDENT_AMBULATORY_CARE_PROVIDER_SITE_OTHER): Payer: Medicare Other

## 2021-12-21 VITALS — BP 140/86 | HR 87 | Temp 97.8°F | Resp 18 | Ht 70.0 in | Wt 170.0 lb

## 2021-12-21 DIAGNOSIS — M79642 Pain in left hand: Secondary | ICD-10-CM | POA: Diagnosis not present

## 2021-12-21 DIAGNOSIS — W2209XA Striking against other stationary object, initial encounter: Secondary | ICD-10-CM | POA: Diagnosis not present

## 2021-12-21 DIAGNOSIS — S61402A Unspecified open wound of left hand, initial encounter: Secondary | ICD-10-CM | POA: Diagnosis not present

## 2021-12-21 NOTE — Discharge Instructions (Signed)
-  The x-ray is normal.  Nothing is fractured.  You still have bruising.  Can ice it and take Tylenol for pain and it should get better over the next week or so. ?- The wound does not appear to be infected.  Continue to keep it clean.  Can clean with soap and water and apply Neosporin and bandage as I did today.  Look out for any infection.  If there is any increased redness or swelling, pain, pustular drainage or its not healing, please return or follow-up with PCP for reexamination. ?

## 2021-12-21 NOTE — ED Triage Notes (Signed)
Pt states he hit his hand on furniture about 5 days ago. He has a skin tare near the thumb Pt has pain and swelling, no significant redness. He states he does not have pain unless he moves his hand.  ?

## 2021-12-21 NOTE — ED Provider Notes (Signed)
[facility]    CSN: [id number] Arrival date & time: 12/21/21  0945      History   Chief Complaint Chief Complaint  Patient presents with   Hand Injury    left    HPI George [name] is a 88 y.o. male presenting with his friend for 5-day history of open wound of the left hand.  Patient's friend is helping to give the history since patient has a hard time of hearing.  She states that he hit it on a piece of furniture.  She has been cleaning it with hydroperoxide and applying Neosporin.  States it looks a lot better than it did but she just wants to make sure its not infected.  He has pain only when he tries to move his hand.  Little bit of swelling of his hand.  No fevers reported.  Not taking anything for pain relief.  No other injuries reported.  No other complaints.  HPI  Past Medical History:  Diagnosis Date   Anemia    Arthritis    BPH (benign prostatic hyperplasia)    Cancer ([place])    COLON   COPD (chronic obstructive pulmonary disease) (HCC)    Coronary artery disease    WITH 1 STENT   Dyspnea    WITH EXERTION ONLY   GERD (gastroesophageal reflux disease)    HOH (hard of hearing)    Hypercholesteremia    Hypertension    Pneumonia 06/2021   Sleep apnea    HAD UPPP    Patient Active Problem List   Diagnosis Date Noted   Acute on chronic respiratory failure with hypoxia ([place]) 06/08/2021   Gout 06/08/2021   Sepsis ([place]) 06/08/2021   COPD exacerbation ([place]) 06/08/2021   CAP (community acquired pneumonia) 06/08/2021   Status post reverse arthroplasty of shoulder, left 02/10/2021   Acute kidney injury superimposed on CKD IIIb ([place]) 11/04/2020   BPH with obstruction/lower urinary tract symptoms 11/04/2020   Hyperkalemia 11/04/2020   Acute urinary retention 11/04/2020   Pneumonia 06/25/2019   Foot pain 07/11/2018   Urinary urgency 06/27/2017   Erectile dysfunction 06/27/2017   Stage 3 acute kidney injury ([place]) 06/27/2017   Hypogonadism in male  06/27/2017   Vomiting 02/09/2016   Slurred speech 02/09/2016   Nausea and vomiting 02/09/2016   Lower abdominal pain 02/09/2016   Tobacco abuse 02/09/2016   Essential hypertension 02/09/2016   GERD (gastroesophageal reflux disease) 02/09/2016   Hyperlipidemia 04/22/2014   Hypertension 04/22/2014   COPD (chronic obstructive pulmonary disease) ([place]) 04/22/2014   Type II diabetes mellitus with renal manifestations ([place]) 04/22/2014   CAD (coronary artery disease) 09/10/1998    Past Surgical History:  Procedure Laterality Date   CARDIAC CATHETERIZATION     has a stent   COLON SURGERY     DUE TO COLON CANCER   COLONOSCOPY     MULTIPLE   EYE SURGERY Bilateral    CATARACT   HERNIA REPAIR     HOLEP-LASER ENUCLEATION OF THE PROSTATE WITH MORCELLATION N/A 08/21/2021   Procedure: HOLEP-LASER ENUCLEATION OF THE PROSTATE WITH MORCELLATION;  Surgeon: [name], MD;  Location: [facility] OR[facility];  Service: Urology;  Laterality: N/A;   REVERSE SHOULDER ARTHROPLASTY Left 02/10/2021   Procedure: REVERSE SHOULDER ARTHROPLASTY;  Surgeon: Corky Mull, MD;  Location: [facility];  Service: Orthopedics;  Laterality: Left;   TONSILLECTOMY     UVULOPALATOPHARYNGOPLASTY         Home Medications    Prior to Admission  medications   Medication Sig Start Date End Date Taking? Authorizing Provider  albuterol (VENTOLIN HFA) 108 (90 Base) MCG/ACT inhaler Inhale 1-2 puffs into the lungs every 6 (six) hours as needed for wheezing or shortness of breath.   Yes [provider]  aspirin EC 81 MG tablet Take 81 mg by mouth daily. Swallow whole.   Yes [provider]  colchicine 0.6 MG tablet Take 0.5 tablets (0.3 mg total) by mouth daily. 06/17/21  Yes Enzo Bi, MD  cyanocobalamin (,VITAMIN B-12,) 1000 MCG/ML injection Inject 1,000 mcg into the muscle every 30 (thirty) days.   Yes [provider]  Fluticasone-Umeclidin-Vilant (TRELEGY ELLIPTA) 100-62.5-25 MCG/INH AEPB Inhale 1 puff into the  lungs every morning.   Yes [provider]  hyoscyamine (LEVSIN) 0.125 MG tablet Take 0.125 mg by mouth every 4 (four) hours as needed for cramping.   Yes [provider]  lisinopril (PRINIVIL,ZESTRIL) 20 MG tablet Take 20 mg by mouth 2 (two) times daily.   Yes [provider]  simvastatin (ZOCOR) 20 MG tablet Take 10 mg by mouth at bedtime.   Yes [provider]  omeprazole (PRILOSEC) 20 MG capsule Take 20 mg by mouth every morning.    [provider]  testosterone cypionate (DEPOTESTOSTERONE CYPIONATE) 200 MG/ML injection Inject 200 mg into the muscle every 14 (fourteen) days.    [provider]  verapamil (CALAN-SR) 240 MG CR tablet Take 240 mg by mouth 2 (two) times daily.    [provider]    Family History Family History  Problem Relation Age of Onset   Prostate cancer Neg Hx    Bladder Cancer Neg Hx    Kidney cancer Neg Hx     Social History Social History   Tobacco Use   Smoking status: Every Day    Packs/day: 1.50    Years: 60.00    Pack years: 90.00    Types: Cigarettes   Smokeless tobacco: Never  Vaping Use   Vaping Use: Never used  Substance Use Topics   Alcohol use: Not Currently    Comment: occ WINE   Drug use: No     Allergies   Patient has no known allergies.   Review of Systems Review of Systems  Constitutional:  Negative for fatigue and fever.  Musculoskeletal:  Positive for arthralgias and joint swelling.  Skin:  Positive for wound. Negative for color change.  Neurological:  Negative for weakness and numbness.    Physical Exam Triage Vital Signs ED Triage Vitals  Enc Vitals Group     BP 12/21/21 1000 140/86     Pulse Rate 12/21/21 1000 87     Resp 12/21/21 1000 18     Temp 12/21/21 1000 97.8 F (36.6 C)     Temp Source 12/21/21 1000 Oral     SpO2 12/21/21 1000 97 %     Weight 12/21/21 0958 169 lb 15.6 oz (77.1 kg)     Height 12/21/21 0958 '5\' 10"'$  (1.778 m)     Head  Circumference --      Peak Flow --      Pain Score 12/21/21 0956 0     Pain Loc --      Pain Edu? --      Excl. in [place]? --    No data found.  Updated Vital Signs BP 140/86 (BP Location: Left Arm)    Pulse 87    Temp 97.8 F (36.6 C) (Oral)    Resp 18  Ht '5\' 10"'$  (1.778 m)    Wt 169 lb 15.6 oz (77.1 kg)    SpO2 97%    BMI 24.39 kg/m      Physical Exam Vitals and nursing note reviewed.  Constitutional:      General: He is not in acute distress.    Appearance: Normal appearance. He is well-developed.  HENT:     Head: Normocephalic and atraumatic.  Eyes:     General: No scleral icterus.    Conjunctiva/sclera: Conjunctivae normal.  Cardiovascular:     Rate and Rhythm: Normal rate and regular rhythm.     Pulses: Normal pulses.  Pulmonary:     Effort: Pulmonary effort is normal. No respiratory distress.  Musculoskeletal:     Cervical back: Neck supple.  Skin:    General: Skin is warm and dry.     Capillary Refill: Capillary refill takes less than 2 seconds.     Comments: See images below.  First image is before mild debridement.  Superficial quarter size open lesion of left dorsal hand.  Area is tender to palpation.  There are areas of gray and black tissue consistent with necrotic tissue.  It looks as though patient had a skin flap that did not heal over.  There is no surrounding erythema.  No drainage from wound.  Neurological:     Mental Status: He is alert.     Motor: No weakness.     Gait: Gait normal.  Psychiatric:        Mood and Affect: Mood normal.        Behavior: Behavior normal.        Thought Content: Thought content normal.        UC Treatments / Results  Labs (all labs ordered are listed, but only abnormal results are displayed) Labs Reviewed - No data to display  EKG   Radiology DG Hand Complete Left  Result Date: 12/21/2021 CLINICAL DATA:  Pain and swelling after hitting hand on furniture 5 days ago. EXAM: LEFT HAND - COMPLETE 3+ VIEW COMPARISON:   None. FINDINGS: There is no evidence of fracture or dislocation. There is no evidence of arthropathy or other focal bone abnormality. Soft tissues are unremarkable. IMPRESSION: Negative. Electronically Signed   By: Misty Stanley M.D.   On: 12/21/2021 10:26    Procedures Procedures (including critical care time)  WOUND CARE/DEBRIDEMENT: After patient consent, area cleansed with wound cleanser.  Then used sterile scissors and forceps to remove necrotic skin flaps.  Then applied nonstick pad and [place].  Patient tolerated well.  Medications Ordered in UC Medications - No data to display  Initial Impression / Assessment and Plan / UC Course  I have reviewed the triage vital signs and the nursing notes.  Pertinent labs & imaging results that were available during my care of the patient were reviewed by me and considered in my medical decision making (see chart for details).  88 year old male presenting with friend for left hand injury.  Patient has superficial open wound of the left hand and is complaining of discomfort when he moves it.  X-ray of hand obtained today shows no evidence of fracture.  Discussed this with patient.  He does have a superficial wound as shown in photographs above.  No sign of infection.  I did debride some of the areas of necrotic tissue and patient tolerated this well.  Advised on wound care and not to use anymore hydroperoxide, do soap and water.  May apply Neosporin but  very sparingly.  Discussed monitoring closely for any signs of infection and returning or following up with PCP if signs of infection or nonhealing wound.   Final Clinical Impressions(s) / UC Diagnoses   Final diagnoses:  Open wound of left hand without foreign body, unspecified wound type, initial encounter  Left hand pain     Discharge Instructions      -The x-ray is normal.  Nothing is fractured.  You still have bruising.  Can ice it and take Tylenol for pain and it should get better over the  next week or so. - The wound does not appear to be infected.  Continue to keep it clean.  Can clean with soap and water and apply Neosporin and bandage as I did today.  Look out for any infection.  If there is any increased redness or swelling, pain, pustular drainage or its not healing, please return or follow-up with PCP for reexamination.     ED Prescriptions   None    PDMP not reviewed this encounter.   Danton Clap, PA-C 12/21/21 1057

## 2022-01-08 IMAGING — DX DG CHEST 1V PORT
1 series · 1 of 1 positions shown · non-contrast
Comparison: Chest radiograph dated 11/04/2020.

CLINICAL DATA: Shortness of breath.

EXAM:
PORTABLE CHEST 1 VIEW

[chest ap]
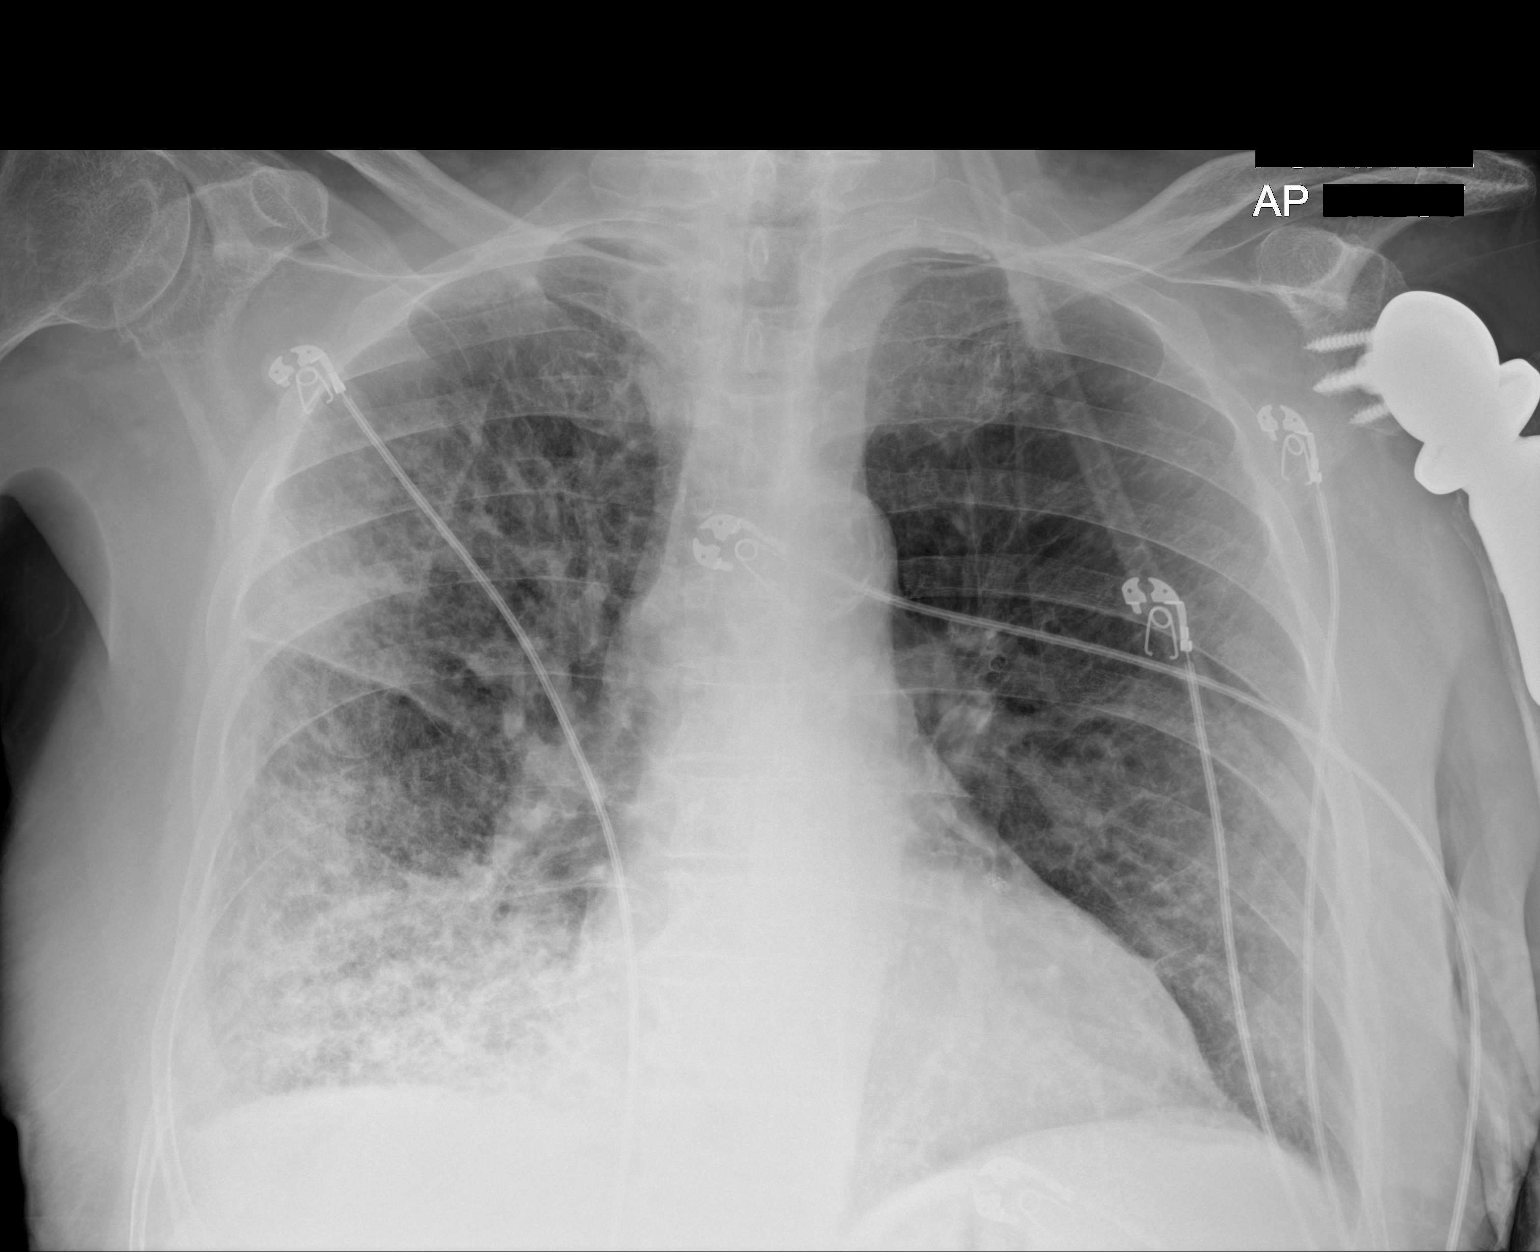

[1 of 1 positions shown; findings below may reference images not displayed]

FINDINGS: Diffuse interstitial and airspace density involving the right lung,
new since the prior radiograph most consistent with developing
infiltrate. There is background of emphysema. Trace right pleural
effusion may be present. No pneumothorax. Cardiac silhouette is
within limits. Atherosclerotic calcification of the aorta. Left
shoulder arthroplasty. No acute osseous pathology.
IMPRESSION: 1. Right lung infiltrate most consistent with pneumonia.
2. Emphysema.

## 2022-01-08 IMAGING — US US RENAL
1 series · 15 of 25 positions shown · non-contrast
Comparison: Renal stone protocol CT 11/04/2020

CLINICAL DATA: Acute kidney injury

EXAM:
RENAL / URINARY TRACT ULTRASOUND COMPLETE

[Series 1: us renal · 15 of 38 slices shown]
[im 1/38]
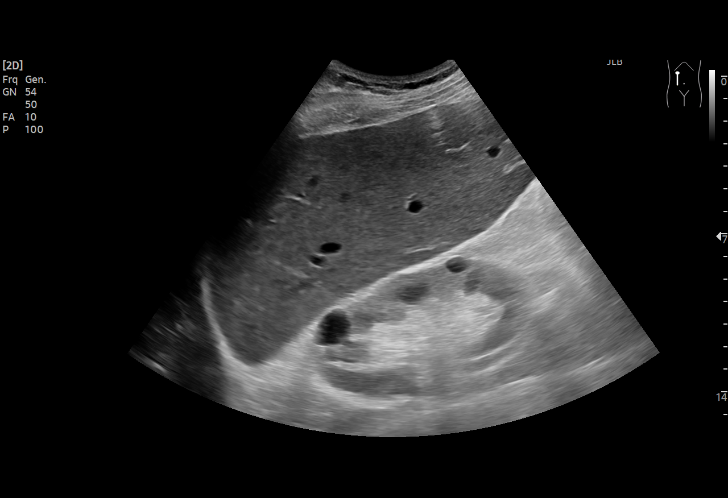
[im 4/38]
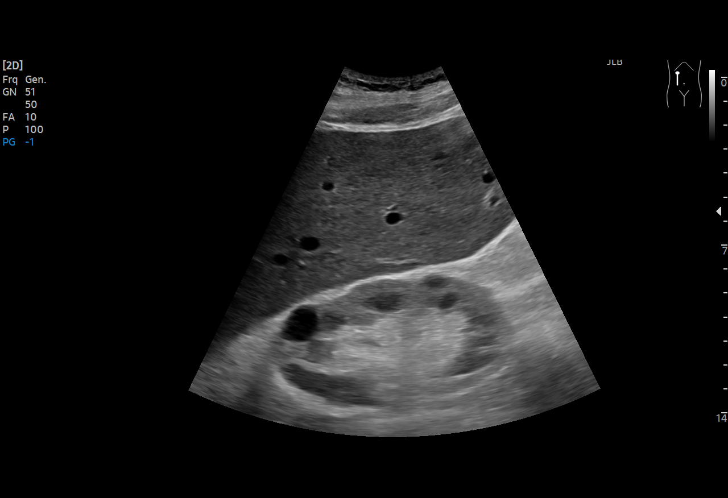
[im 7/38]
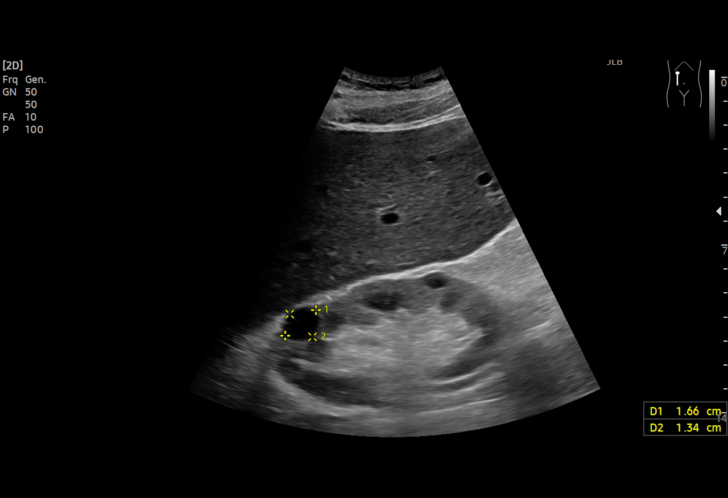
[im 8/38]
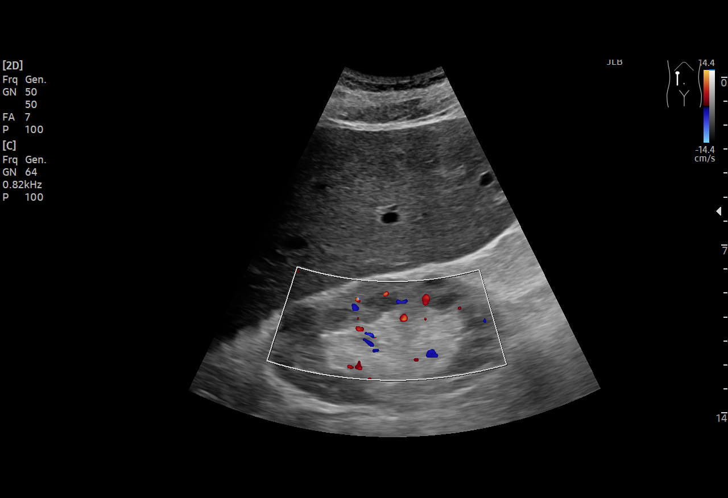
[im 11/38]
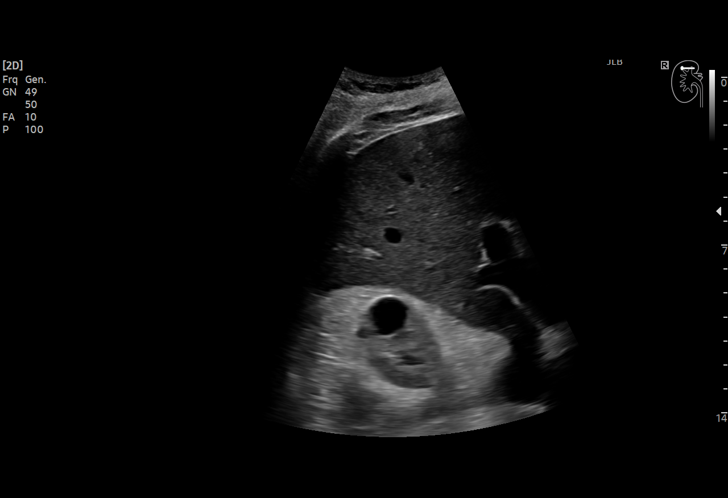
[im 14/38]
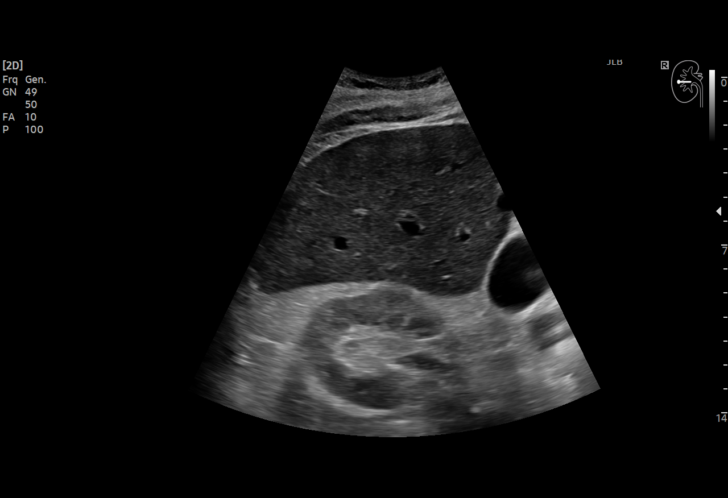
[im 16/38]
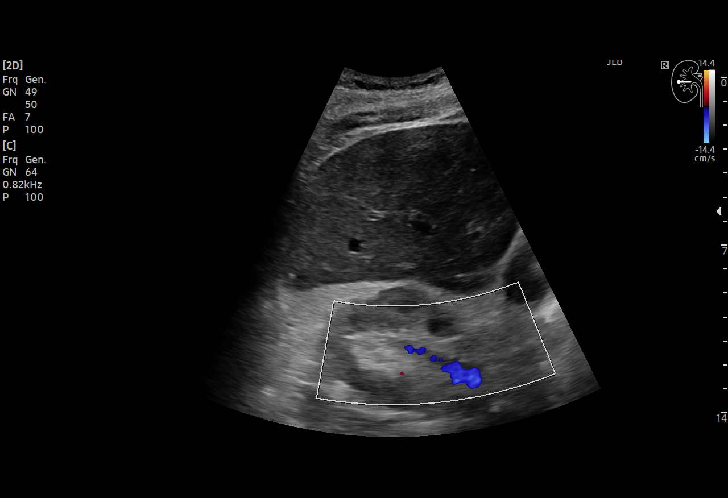
[im 19/38]
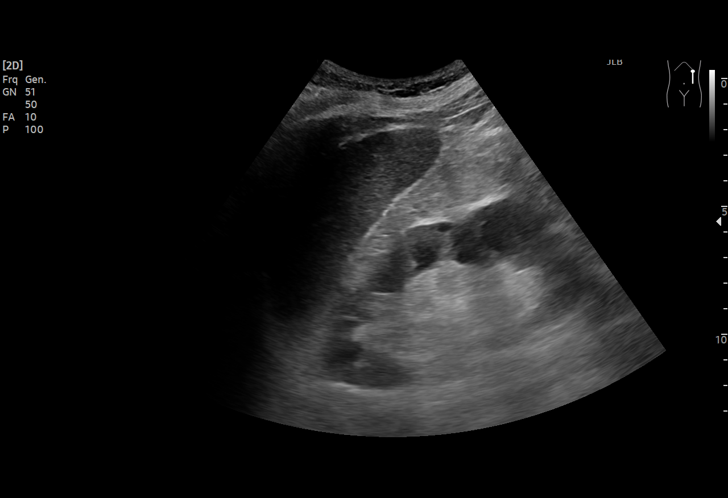
[im 22/38]
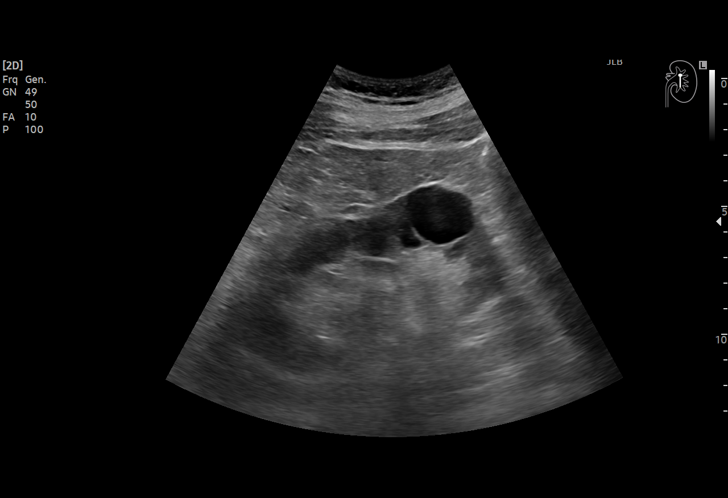
[im 24/38]
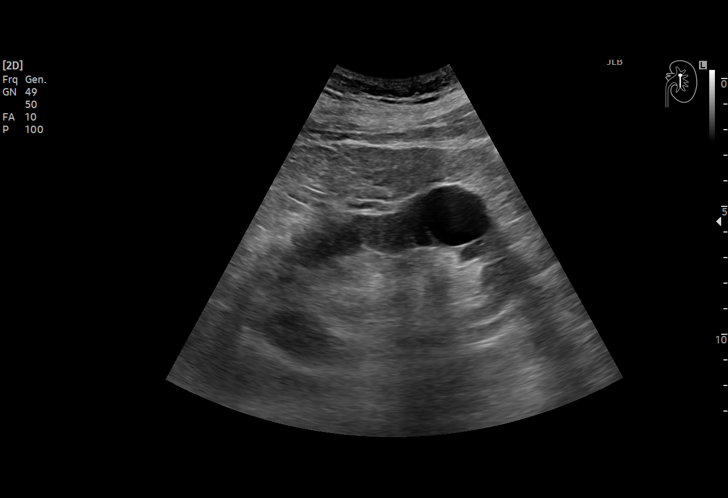
[im 27/38]
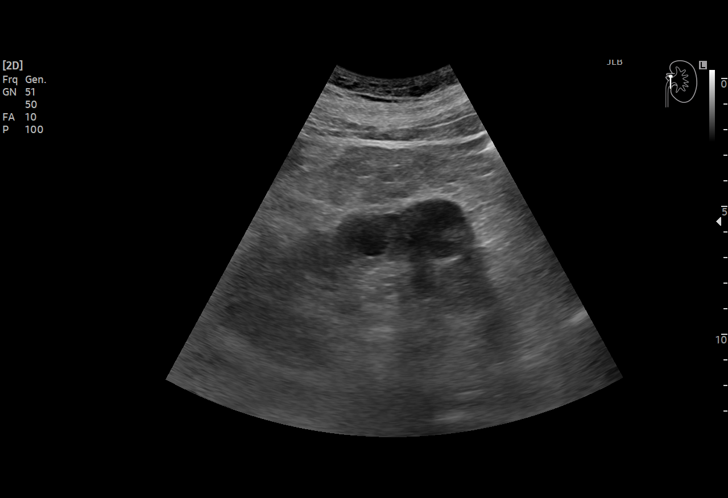
[im 30/38]
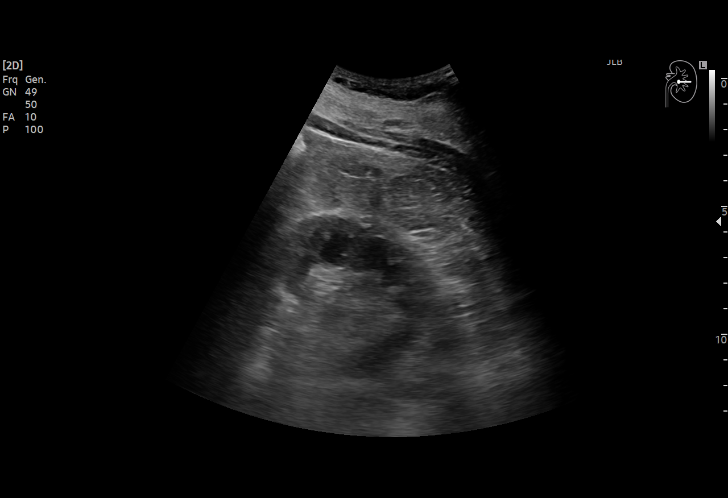
[im 31/38]
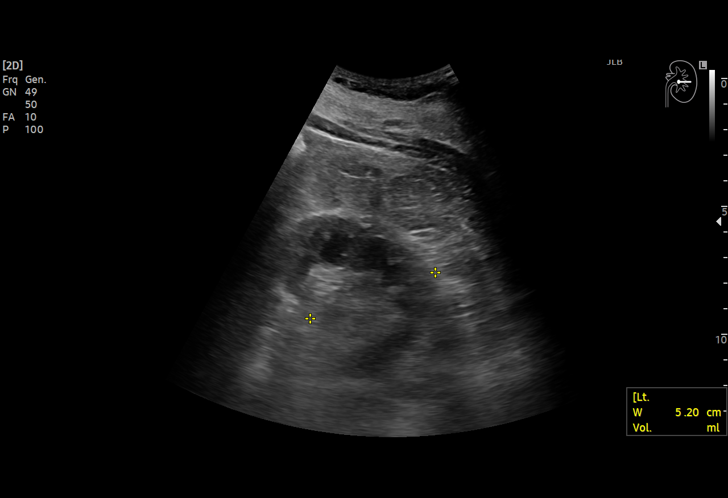
[im 34/38]
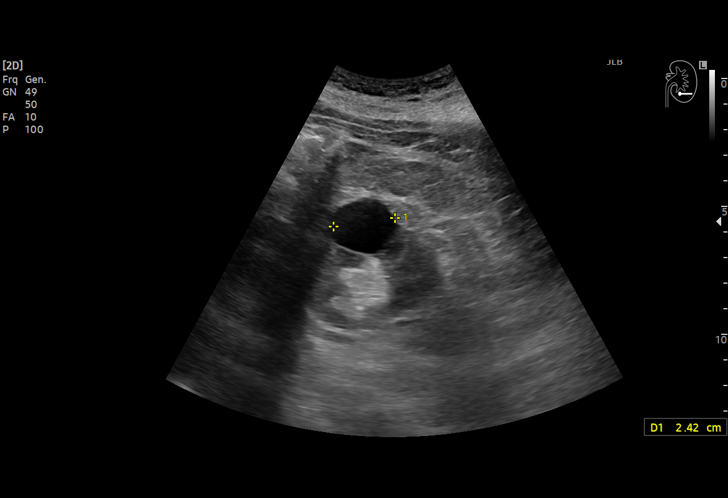
[im 38/38]
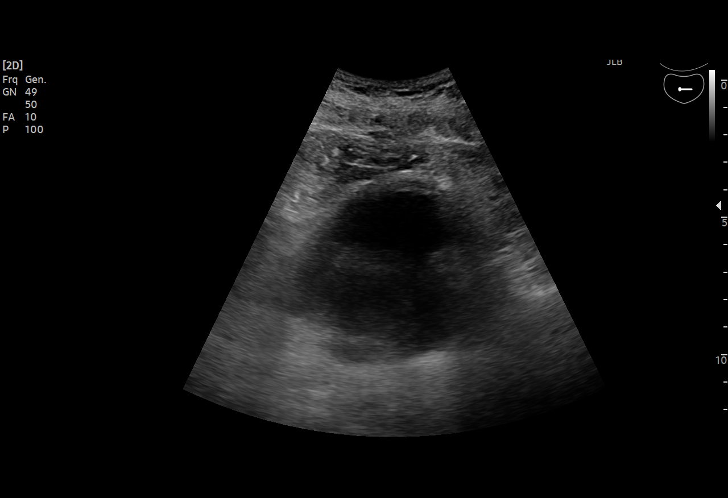

[15 of 25 positions shown; findings below may reference images not displayed]

FINDINGS: Right Kidney:

Renal measurements: 10.4 x 4.9 x 5.3 cm = volume: 142 mL. Mild
diffuse increased echogenicity of the parenchyma. No mass or
hydronephrosis visualized. 1.7 cm simple cyst present the upper
pole.

Left Kidney:

Renal measurements: 11.1 x 5.9 x 5.2 cm = volume: 178 mL. Mild
diffuse increased echogenicity of the parenchyma. No mass or
hydronephrosis visualized. 2.4 cm simple cyst present in the lower
pole.

Bladder:

Evaluation limited due to collapse configuration.

Other:

Enlarged prostate partially visualized.
IMPRESSION: Increased echogenicity of the renal cortex consistent with chronic
medical renal disease.

## 2022-03-23 ENCOUNTER — Ambulatory Visit: Payer: Self-pay | Admitting: Urology

## 2022-05-06 ENCOUNTER — Ambulatory Visit
Admission: RE | Admit: 2022-05-06 | Discharge: 2022-05-06 | Disposition: A | Discharge status: Home / Self Care | Payer: Medicare Other | Source: Ambulatory Visit | Attending: Physician Assistant | Admitting: Physician Assistant

## 2022-05-06 ENCOUNTER — Ambulatory Visit (INDEPENDENT_AMBULATORY_CARE_PROVIDER_SITE_OTHER): Payer: Medicare Other

## 2022-05-06 ENCOUNTER — Other Ambulatory Visit: Payer: Self-pay

## 2022-05-06 VITALS — BP 126/70 | HR 81 | Temp 97.8°F | Resp 18 | Ht 70.0 in | Wt 170.0 lb

## 2022-05-06 DIAGNOSIS — L089 Local infection of the skin and subcutaneous tissue, unspecified: Secondary | ICD-10-CM | POA: Diagnosis not present

## 2022-05-06 DIAGNOSIS — M79675 Pain in left toe(s): Secondary | ICD-10-CM

## 2022-05-06 MED ORDER — CEPHALEXIN 500 MG PO CAPS: 500.0000 mg | ORAL_CAPSULE | Freq: Two times a day (BID) | ORAL | 0 refills | Status: AC

## 2022-05-06 NOTE — Discharge Instructions (Addendum)
Cephalexin: 1 tablet twice a day for 10 days -Ibuprofen Tylenol as needed for pain -If no improvement after 3 days of taking antibiotic or symptoms worsen, return to clinic for reevaluation.

## 2022-05-06 NOTE — ED Triage Notes (Signed)
Pt c/o left great toe, pain,swelling and redness. Started about 2 weeks ago. Pt does not remember what he injured his toe on. Home health nurse has been taking care of wound. He states there is no pain unless it is touched.

## 2022-05-06 NOTE — ED Provider Notes (Addendum)
[facility]    CSN: [id number] Arrival date & time: 05/06/22  0940      History   Chief Complaint Chief Complaint  Patient presents with   Toe Injury    Left great toe.     HPI George [name] is a 88 y.o. male.   Patient is a 88 year old male who presents with chief complaint of right toe pain x3 to 4 weeks.  Patient seen by home health who has been attempting to treat the toe.  Patient and friend with him both state he is prone to stubbing his toe objects and friend states he also will drag his toe.  Patient does have a history of renal insufficiency and takes colchicine daily.  Wife reports history of gout approximately 5 years ago.    Past Medical History:  Diagnosis Date   Anemia    Arthritis    BPH (benign prostatic hyperplasia)    Cancer (HCC)    COLON   COPD (chronic obstructive pulmonary disease) (HCC)    Coronary artery disease    WITH 1 STENT   Dyspnea    WITH EXERTION ONLY   GERD (gastroesophageal reflux disease)    HOH (hard of hearing)    Hypercholesteremia    Hypertension    Pneumonia 06/2021   Sleep apnea    HAD UPPP    Patient Active Problem List   Diagnosis Date Noted   Acute on chronic respiratory failure with hypoxia ([place]) 06/08/2021   Gout 06/08/2021   Sepsis ([place]) 06/08/2021   COPD exacerbation ([place]) 06/08/2021   CAP (community acquired pneumonia) 06/08/2021   Status post reverse arthroplasty of shoulder, left 02/10/2021   Acute kidney injury superimposed on CKD IIIb ([place]) 11/04/2020   BPH with obstruction/lower urinary tract symptoms 11/04/2020   Hyperkalemia 11/04/2020   Acute urinary retention 11/04/2020   Pneumonia 06/25/2019   Foot pain 07/11/2018   Urinary urgency 06/27/2017   Erectile dysfunction 06/27/2017   Stage 3 acute kidney injury ([place]) 06/27/2017   Hypogonadism in male 06/27/2017   Vomiting 02/09/2016   Slurred speech 02/09/2016   Nausea and vomiting 02/09/2016   Lower abdominal pain 02/09/2016    Tobacco abuse 02/09/2016   Essential hypertension 02/09/2016   GERD (gastroesophageal reflux disease) 02/09/2016   Hyperlipidemia 04/22/2014   Hypertension 04/22/2014   COPD (chronic obstructive pulmonary disease) ([place]) 04/22/2014   Type II diabetes mellitus with renal manifestations ([place]) 04/22/2014   CAD (coronary artery disease) 09/10/1998    Past Surgical History:  Procedure Laterality Date   CARDIAC CATHETERIZATION     has a stent   COLON SURGERY     DUE TO COLON CANCER   COLONOSCOPY     MULTIPLE   EYE SURGERY Bilateral    CATARACT   HERNIA REPAIR     HOLEP-LASER ENUCLEATION OF THE PROSTATE WITH MORCELLATION N/A 08/21/2021   Procedure: HOLEP-LASER ENUCLEATION OF THE PROSTATE WITH MORCELLATION;  Surgeon: [name], MD;  Location: [facility];  Service: Urology;  Laterality: N/A;   REVERSE SHOULDER ARTHROPLASTY Left 02/10/2021   Procedure: REVERSE SHOULDER ARTHROPLASTY;  Surgeon: Corky Mull, MD;  Location: [facility];  Service: Orthopedics;  Laterality: Left;   TONSILLECTOMY     UVULOPALATOPHARYNGOPLASTY         Home Medications    Prior to Admission medications   Medication Sig Start Date End Date Taking? Authorizing Provider  albuterol (VENTOLIN HFA) 108 (90 Base) MCG/ACT inhaler Inhale 1-2 puffs into the lungs every 6 (  six) hours as needed for wheezing or shortness of breath.   Yes [provider]  aspirin EC 81 MG tablet Take 81 mg by mouth daily. Swallow whole.   Yes [provider]  cephALEXin (KEFLEX) 500 MG capsule Take 1 capsule (500 mg total) by mouth 2 (two) times daily for 10 days. 05/06/22 05/16/22 Yes Luvenia Redden, PA-C  colchicine 0.6 MG tablet Take 0.5 tablets (0.3 mg total) by mouth daily. 06/17/21  Yes Enzo Bi, MD  cyanocobalamin (,VITAMIN B-12,) 1000 MCG/ML injection Inject 1,000 mcg into the muscle every 30 (thirty) days.   Yes [provider]  Fluticasone-Umeclidin-Vilant (TRELEGY ELLIPTA) 100-62.5-25 MCG/INH AEPB Inhale  1 puff into the lungs every morning.   Yes [provider]  hyoscyamine (LEVSIN) 0.125 MG tablet Take 0.125 mg by mouth every 4 (four) hours as needed for cramping.   Yes [provider]  lisinopril (PRINIVIL,ZESTRIL) 20 MG tablet Take 20 mg by mouth 2 (two) times daily.   Yes [provider]  omeprazole (PRILOSEC) 20 MG capsule Take 20 mg by mouth every morning.   Yes [provider]  simvastatin (ZOCOR) 20 MG tablet Take 10 mg by mouth at bedtime.   Yes [provider]  testosterone cypionate (DEPOTESTOSTERONE CYPIONATE) 200 MG/ML injection Inject 200 mg into the muscle every 14 (fourteen) days.   Yes [provider]  verapamil (CALAN-SR) 240 MG CR tablet Take 240 mg by mouth 2 (two) times daily.   Yes [provider]    Family History Family History  Problem Relation Age of Onset   Prostate cancer Neg Hx    Bladder Cancer Neg Hx    Kidney cancer Neg Hx     Social History Social History   Tobacco Use   Smoking status: Every Day    Packs/day: 1.50    Years: 60.00    Total pack years: 90.00    Types: Cigarettes   Smokeless tobacco: Never  Vaping Use   Vaping Use: Never used  Substance Use Topics   Alcohol use: Not Currently    Comment: occ WINE   Drug use: No     Allergies   Patient has no known allergies.   Review of Systems Review of Systems as noted in HPI.  Other systems reviewed and found to be negative   Physical Exam Triage Vital Signs ED Triage Vitals  Enc Vitals Group     BP 05/06/22 1029 126/70     Pulse Rate 05/06/22 1029 81     Resp 05/06/22 1029 18     Temp 05/06/22 1029 97.8 F (36.6 C)     Temp src --      SpO2 05/06/22 1029 95 %     Weight 05/06/22 1027 169 lb 15.6 oz (77.1 kg)     Height 05/06/22 1027 '5\' 10"'$  (1.778 m)     Head Circumference --      Peak Flow --      Pain Score 05/06/22 1027 0     Pain Loc --      Pain Edu? --      Excl. in [place]? --    No data found.  Updated  Vital Signs BP 126/70   Pulse 81   Temp 97.8 F (36.6 C)   Resp 18   Ht '5\' 10"'$  (1.778 m)   Wt 169 lb 15.6 oz (77.1 kg)   SpO2 95%   BMI 24.39 kg/m    Physical Exam Constitutional:  Appearance: Normal appearance.     Comments: Walks with a cane.  Musculoskeletal:       Feet:  Neurological:     Mental Status: He is alert.       UC Treatments / Results  Labs (all labs ordered are listed, but only abnormal results are displayed) Labs Reviewed - No data to display  EKG   Radiology No results found.  Procedures Procedures (including critical care time)  Medications Ordered in UC Medications - No data to display  Initial Impression / Assessment and Plan / UC Course  I have reviewed the triage vital signs and the nursing notes.  Pertinent labs & imaging results that were available during my care of the patient were reviewed by me and considered in my medical decision making (see chart for details).    Exam as above.  Currently, x-ray results were not crossing over into epic from radiology.  Called and spoke with radiology service on phone.  Reports inflammation around and under the nailbed with some pockets of air.  Concerning for infection to exclude air forming organisms.  We will going to give the patient a prescription for Keflex.  Advised him to return to clinic in 3 days if he has no improvement.  I am currently unable to see the images myself so unsure if the reported area could be from infection, and the open area of the wound, or from his nail being lifted up a little bit.  This is further reason to have him come back in few days if no improvement. Final Clinical Impressions(s) / UC Diagnoses   Final diagnoses:  Toe infection     Discharge Instructions      Cephalexin: 1 tablet twice a day for 10 days -Ibuprofen Tylenol as needed for pain -If no improvement after 3 days of taking antibiotic or symptoms worsen, return to clinic for  reevaluation.     ED Prescriptions     Medication Sig Dispense Auth. Provider   cephALEXin (KEFLEX) 500 MG capsule Take 1 capsule (500 mg total) by mouth 2 (two) times daily for 10 days. 20 capsule Luvenia Redden, PA-C      PDMP not reviewed this encounter.   Luvenia Redden, PA-C 05/06/22 1131    Luvenia Redden, PA-C 05/06/22 1135

## 2022-07-03 ENCOUNTER — Ambulatory Visit: Payer: Self-pay

## 2022-07-04 ENCOUNTER — Ambulatory Visit
Admission: EM | Admit: 2022-07-04 | Discharge: 2022-07-04 | Disposition: A | Discharge status: Home / Self Care | Payer: Medicare Other | Attending: Family Medicine | Admitting: Family Medicine

## 2022-07-04 ENCOUNTER — Ambulatory Visit (INDEPENDENT_AMBULATORY_CARE_PROVIDER_SITE_OTHER): Payer: Medicare Other

## 2022-07-04 ENCOUNTER — Ambulatory Visit: Payer: Medicare Other

## 2022-07-04 DIAGNOSIS — R52 Pain, unspecified: Secondary | ICD-10-CM

## 2022-07-04 DIAGNOSIS — M109 Gout, unspecified: Secondary | ICD-10-CM | POA: Diagnosis not present

## 2022-07-04 MED ORDER — DEXAMETHASONE SODIUM PHOSPHATE 10 MG/ML IJ SOLN
10.0000 mg | Freq: Once | INTRAMUSCULAR | Status: AC
  Administered 2022-07-04: 10 mg via INTRAMUSCULAR

## 2022-07-04 NOTE — Discharge Instructions (Addendum)
Take 400 mg Ibuprofen every 8 hours, if needed for pain.

## 2022-07-04 NOTE — ED Triage Notes (Signed)
Patient reports that his right ankle is swelling for about 4 days. Patient reports that he did fall a maybe Monday of last week.   Caregiver reports that his ankle hurts to touch.

## 2022-07-04 NOTE — ED Provider Notes (Signed)
[facility]    CSN: [id number] Arrival date & time: 07/04/22  1455      History   Chief Complaint Chief Complaint  Patient presents with   Ankle Pain    HPI  HPI George [name] is a 88 y.o. male.   George [name] brought in by caregiver for right foot pain that started about 4 days ago.  States that he was walking and had a fall on Monday of last week.  She does not believe that he injured his foot during this time.  She noticed the swelling has gotten better but it is still present.  Says it was very tender to the touch.  Has history of gout.  He likes to eat shrimp and cheese.  Took some Tylenol with some mild relief.  Denies alcohol use.   Fever: no Appetite: normal  Hydration: normal  Knee pain: no Nausea: no Vomiting: no Sleep disturbance: no  Back Pain: no Headache: no     Past Medical History:  Diagnosis Date   Anemia    Arthritis    BPH (benign prostatic hyperplasia)    Cancer (HCC)    COLON   COPD (chronic obstructive pulmonary disease) (HCC)    Coronary artery disease    WITH 1 STENT   Dyspnea    WITH EXERTION ONLY   GERD (gastroesophageal reflux disease)    HOH (hard of hearing)    Hypercholesteremia    Hypertension    Pneumonia 06/2021   Sleep apnea    HAD UPPP    Patient Active Problem List   Diagnosis Date Noted   Acute on chronic respiratory failure with hypoxia ([place]) 06/08/2021   Gout 06/08/2021   Sepsis ([place]) 06/08/2021   COPD exacerbation ([place]) 06/08/2021   CAP (community acquired pneumonia) 06/08/2021   Status post reverse arthroplasty of shoulder, left 02/10/2021   Acute kidney injury superimposed on CKD IIIb ([place]) 11/04/2020   BPH with obstruction/lower urinary tract symptoms 11/04/2020   Hyperkalemia 11/04/2020   Acute urinary retention 11/04/2020   Pneumonia 06/25/2019   Foot pain 07/11/2018   Urinary urgency 06/27/2017   Erectile dysfunction 06/27/2017   Stage 3 acute kidney injury ([place]) 06/27/2017   Hypogonadism in  male 06/27/2017   Vomiting 02/09/2016   Slurred speech 02/09/2016   Nausea and vomiting 02/09/2016   Lower abdominal pain 02/09/2016   Tobacco abuse 02/09/2016   Essential hypertension 02/09/2016   GERD (gastroesophageal reflux disease) 02/09/2016   Hyperlipidemia 04/22/2014   Hypertension 04/22/2014   COPD (chronic obstructive pulmonary disease) ([place]) 04/22/2014   Type II diabetes mellitus with renal manifestations ([name]) 04/22/2014   CAD (coronary artery disease) 09/10/1998    Past Surgical History:  Procedure Laterality Date   CARDIAC CATHETERIZATION     has a stent   COLON SURGERY     DUE TO COLON CANCER   COLONOSCOPY     MULTIPLE   EYE SURGERY Bilateral    CATARACT   HERNIA REPAIR     HOLEP-LASER ENUCLEATION OF THE PROSTATE WITH MORCELLATION N/A 08/21/2021   Procedure: HOLEP-LASER ENUCLEATION OF THE PROSTATE WITH MORCELLATION;  Surgeon: [name], MD;  Location: [facility] OR[facility];  Service: Urology;  Laterality: N/A;   REVERSE SHOULDER ARTHROPLASTY Left 02/10/2021   Procedure: REVERSE SHOULDER ARTHROPLASTY;  Surgeon: Corky Mull, MD;  Location: [facility] OR[facility];  Service: Orthopedics;  Laterality: Left;   TONSILLECTOMY     UVULOPALATOPHARYNGOPLASTY         Home Medications  Prior to Admission medications   Medication Sig Start Date End Date Taking? Authorizing Provider  albuterol (VENTOLIN HFA) 108 (90 Base) MCG/ACT inhaler Inhale 1-2 puffs into the lungs every 6 (six) hours as needed for wheezing or shortness of breath.    [provider]  aspirin EC 81 MG tablet Take 81 mg by mouth daily. Swallow whole.    [provider]  colchicine 0.6 MG tablet Take 0.5 tablets (0.3 mg total) by mouth daily. 06/17/21   Enzo Bi, MD  cyanocobalamin (,VITAMIN B-12,) 1000 MCG/ML injection Inject 1,000 mcg into the muscle every 30 (thirty) days.    [provider]  Fluticasone-Umeclidin-Vilant (TRELEGY ELLIPTA) 100-62.5-25 MCG/INH AEPB Inhale 1 puff into the lungs  every morning.    [provider]  hyoscyamine (LEVSIN) 0.125 MG tablet Take 0.125 mg by mouth every 4 (four) hours as needed for cramping.    [provider]  lisinopril (PRINIVIL,ZESTRIL) 20 MG tablet Take 20 mg by mouth 2 (two) times daily.    [provider]  omeprazole (PRILOSEC) 20 MG capsule Take 20 mg by mouth every morning.    [provider]  simvastatin (ZOCOR) 20 MG tablet Take 10 mg by mouth at bedtime.    [provider]  testosterone cypionate (DEPOTESTOSTERONE CYPIONATE) 200 MG/ML injection Inject 200 mg into the muscle every 14 (fourteen) days.    [provider]  verapamil (CALAN-SR) 240 MG CR tablet Take 240 mg by mouth 2 (two) times daily.    [provider]    Family History Family History  Problem Relation Age of Onset   Prostate cancer Neg Hx    Bladder Cancer Neg Hx    Kidney cancer Neg Hx     Social History Social History   Tobacco Use   Smoking status: Every Day    Packs/day: 1.50    Years: 60.00    Total pack years: 90.00    Types: Cigarettes   Smokeless tobacco: Never  Vaping Use   Vaping Use: Never used  Substance Use Topics   Alcohol use: Not Currently    Comment: occ WINE   Drug use: No     Allergies   Patient has no known allergies.   Review of Systems Review of Systems: :negative unless otherwise stated in HPI.      Physical Exam Triage Vital Signs ED Triage Vitals  Enc Vitals Group     BP 07/04/22 1504 98/65     Pulse Rate 07/04/22 1504 93     Resp --      Temp 07/04/22 1504 98.1 F (36.7 C)     Temp Source 07/04/22 1504 Oral     SpO2 07/04/22 1504 98 %     Weight 07/04/22 1503 170 lb 10.2 oz (77.4 kg)     Height 07/04/22 1503 '5\' 10"'$  (1.778 m)     Head Circumference --      Peak Flow --      Pain Score 07/04/22 1502 0     Pain Loc --      Pain Edu? --      Excl. in [place]? --    No data found.  Updated Vital Signs BP 98/65 (BP Location: Right Arm)   Pulse  93   Temp 98.1 F (36.7 C) (Oral)   Ht '5\' 10"'$  (1.778 m)   Wt 77.4 kg   SpO2 98%   BMI 24.48 kg/m   Visual Acuity Right Eye Distance:   Left  Eye Distance:   Bilateral Distance:    Right Eye Near:   Left Eye Near:    Bilateral Near:     Physical Exam GEN: well appearing pleasant elderly male in no acute distress  HENT: Hard of hearing CVS: well perfused  RESP: speaking in full sentences without pause, no respiratory distress  MSK: Ankle/Foot, right: No tenderness on exam.  +2 nonpitting edema.  No visible erythema, ecchymosis, or bony deformity. Range of motion is full in all directions. Strength is 5/5 in all directions. No tenderness at the insertion/body/myotendinous junction of the Achilles tendon; No tenderness on posterior aspects of lateral and medial malleolus; Stable lateral and medial ligaments; Unremarkable squeeze test; Talar dome nontender; Unremarkable calcaneal squeeze; No plantar calcaneal tenderness; No tenderness over the navicular prominence; No tenderness over cuboid; No pain at base of 5th MT; No tenderness at the distal metatarsals; patient present in the exam room, absent fifth toe on the right foot NEURO: Limited short-term memory, normal speech, alert   UC Treatments / Results  Labs (all labs ordered are listed, but only abnormal results are displayed) Labs Reviewed - No data to display  EKG   Radiology DG Foot Complete Right  Result Date: 07/04/2022 CLINICAL DATA:  Right foot pain for 3 or 4 days.  No known injury. EXAM: RIGHT FOOT COMPLETE - 3+ VIEW COMPARISON:  None Available. FINDINGS: The patient is status post amputation of the fifth digit. Deformity of the distal fourth phalanx is stable since July 11, 2018. No acute fractures. No acute bony erosion. Vascular calcifications. IMPRESSION: 1. Chronic changes in the distal fourth phalanx. Amputation of the fifth digit. No acute abnormalities. Electronically Signed   By: Dorise Bullion III M.D.   On:  07/04/2022 15:59    Procedures Procedures (including critical care time)  Medications Ordered in UC Medications  dexamethasone (DECADRON) injection 10 mg (10 mg Intramuscular Given 07/04/22 1618)    Initial Impression / Assessment and Plan / UC Course  I have reviewed the triage vital signs and the nursing notes.  Pertinent labs & imaging results that were available during my care of the patient were reviewed by me and considered in my medical decision making (see chart for details).       Pt is a 88 y.o.  male with 4 days of right foot pain.  Swelling on exam is improved per patient and caregiver.  He had a fall over a week ago.  Caregiver reports that she does not believe he injured his foot during this fall.  He does not go outside and denies any bug bites.  He does have a history of gout.  History states that he was very tender to a touch with the swelling however patient now states that it is not tender anymore.  X-ray of right foot ordered and personally reviewed by me without occult fracture, dislocation or significant malalignment.  Radiology agrees.  I suspect he has an acute gout flare.  Given a Decadron injection here.  Caregiver to give him 400 mg of ibuprofen as needed for pain.    Patient to gradually return to normal activities, as tolerated and continue ordinary activities within the limits permitted by pain. Tylenol PRN.  Return and ED precautions given.   Discussed MDM, treatment plan and plan for follow-up with patient/parent who agrees with plan.   Final Clinical Impressions(s) / UC Diagnoses   Final diagnoses:  Acute gout of right foot, unspecified cause     Discharge  Instructions      Take 400 mg Ibuprofen every 8 hours, if needed for pain.      ED Prescriptions   None    PDMP not reviewed this encounter.   Lyndee Hensen, DO 07/04/22 1955

## 2022-08-03 ENCOUNTER — Ambulatory Visit: Payer: Medicare Other | Admitting: Podiatry

## 2022-08-03 DIAGNOSIS — L603 Nail dystrophy: Secondary | ICD-10-CM | POA: Diagnosis not present

## 2022-08-10 ENCOUNTER — Encounter: Payer: Self-pay | Admitting: Podiatry

## 2022-08-10 NOTE — Progress Notes (Signed)
Subjective:  Patient ID: George [name], male    DOB: July 29, 1932,  MRN: [id number]  Chief Complaint  Patient presents with   Toe Pain    88 y.o. male presents with the above complaint.  Patient presents with complaint of left hallux dystrophic nail.  Patient states painful to touch progressive gotten worse.  He would like to have it removed.  He would like to make it permanent.  Has been bothering him a lot is causing him a lot of pain especially while ambulating.  He has not seen anyone else prior to seeing me he denies any other acute complaints.   Review of Systems: Negative except as noted in the HPI. Denies N/V/F/Ch.  Past Medical History:  Diagnosis Date   Anemia    Arthritis    BPH (benign prostatic hyperplasia)    Cancer (HCC)    COLON   COPD (chronic obstructive pulmonary disease) (HCC)    Coronary artery disease    WITH 1 STENT   Dyspnea    WITH EXERTION ONLY   GERD (gastroesophageal reflux disease)    HOH (hard of hearing)    Hypercholesteremia    Hypertension    Pneumonia 06/2021   Sleep apnea    HAD UPPP    Current Outpatient Medications:    albuterol (VENTOLIN HFA) 108 (90 Base) MCG/ACT inhaler, Inhale 1-2 puffs into the lungs every 6 (six) hours as needed for wheezing or shortness of breath., Disp: , Rfl:    aspirin EC 81 MG tablet, Take 81 mg by mouth daily. Swallow whole., Disp: , Rfl:    colchicine 0.6 MG tablet, Take 0.5 tablets (0.3 mg total) by mouth daily., Disp: , Rfl:    cyanocobalamin (,VITAMIN B-12,) 1000 MCG/ML injection, Inject 1,000 mcg into the muscle every 30 (thirty) days., Disp: , Rfl:    Fluticasone-Umeclidin-Vilant (TRELEGY ELLIPTA) 100-62.5-25 MCG/INH AEPB, Inhale 1 puff into the lungs every morning., Disp: , Rfl:    hyoscyamine (LEVSIN) 0.125 MG tablet, Take 0.125 mg by mouth every 4 (four) hours as needed for cramping., Disp: , Rfl:    lisinopril (PRINIVIL,ZESTRIL) 20 MG tablet, Take 20 mg by mouth 2 (two) times daily., Disp: , Rfl:     omeprazole (PRILOSEC) 20 MG capsule, Take 20 mg by mouth every morning., Disp: , Rfl:    simvastatin (ZOCOR) 20 MG tablet, Take 10 mg by mouth at bedtime., Disp: , Rfl:    testosterone cypionate (DEPOTESTOSTERONE CYPIONATE) 200 MG/ML injection, Inject 200 mg into the muscle every 14 (fourteen) days., Disp: , Rfl:    verapamil (CALAN-SR) 240 MG CR tablet, Take 240 mg by mouth 2 (two) times daily., Disp: , Rfl:   Social History   Tobacco Use  Smoking Status Every Day   Packs/day: 1.50   Years: 60.00   Total pack years: 90.00   Types: Cigarettes  Smokeless Tobacco Never    No Known Allergies Objective:  There were no vitals filed for this visit. There is no height or weight on file to calculate BMI. Constitutional Well developed. Well nourished.  Vascular Dorsalis pedis pulses palpable bilaterally. Posterior tibial pulses palpable bilaterally. Capillary refill normal to all digits.  No cyanosis or clubbing noted. Pedal hair growth normal.  Neurologic Normal speech. Oriented to person, place, and time. Epicritic sensation to light touch grossly present bilaterally.  Dermatologic Pain on palpation of the entire/total nail on 1st digit of the left No other open wounds. No skin lesions.  Orthopedic: Normal joint ROM without pain  or crepitus bilaterally. No visible deformities. No bony tenderness.   Radiographs: None Assessment:   1. Nail dystrophy    Plan:  Patient was evaluated and treated and all questions answered.  Nail contusion/dystrophy hallux, left -Patient elects to proceed with minor surgery to remove entire toenail today. Consent reviewed and signed by patient. -Entire/total nail excised. See procedure note. -Educated on post-procedure care including soaking. Written instructions provided and reviewed. -Patient to follow up in 2 weeks for nail check.  Procedure: Excision of entire/total nail with phenol matricectomy Location: Left 1st toe digit Anesthesia:  Lidocaine 1% plain; 1.5 mL and Marcaine 0.5% plain; 1.5 mL, digital block. Skin Prep: Betadine. Dressing: Silvadene; telfa; dry, sterile, compression dressing. Technique: Following skin prep, the toe was exsanguinated and a tourniquet was secured at the base of the toe. The affected nail border was freed and excised.  Phenol matricectomy was performed in standard technique the tourniquet was then removed and sterile dressing applied. Disposition: Patient tolerated procedure well. Patient to return in 2 weeks for follow-up.   No follow-ups on file.

## 2022-10-20 ENCOUNTER — Ambulatory Visit: Payer: Medicare Other | Admitting: Urology

## 2022-11-17 ENCOUNTER — Ambulatory Visit: Payer: Self-pay | Admitting: Urology

## 2022-11-17 ENCOUNTER — Ambulatory Visit: Payer: Medicare Other | Admitting: Urology

## 2023-01-11 ENCOUNTER — Ambulatory Visit
Admission: RE | Admit: 2023-01-11 | Discharge: 2023-01-11 | Disposition: A | Discharge status: Home / Self Care | Payer: Medicare Other | Source: Ambulatory Visit | Attending: Emergency Medicine | Admitting: Emergency Medicine

## 2023-01-11 ENCOUNTER — Other Ambulatory Visit: Payer: Self-pay

## 2023-01-11 ENCOUNTER — Encounter: Payer: Self-pay | Admitting: *Deleted

## 2023-01-11 ENCOUNTER — Ambulatory Visit: Payer: Medicare Other

## 2023-01-11 ENCOUNTER — Ambulatory Visit (INDEPENDENT_AMBULATORY_CARE_PROVIDER_SITE_OTHER): Payer: Medicare Other

## 2023-01-11 ENCOUNTER — Emergency Department
Admission: EM | Admit: 2023-01-11 | Discharge: 2023-01-11 | Discharge status: Against Medical Advice | Payer: Medicare Other | Attending: Emergency Medicine | Admitting: Emergency Medicine

## 2023-01-11 VITALS — BP 102/61 | HR 82 | Temp 98.5°F

## 2023-01-11 DIAGNOSIS — S50311A Abrasion of right elbow, initial encounter: Secondary | ICD-10-CM

## 2023-01-11 DIAGNOSIS — S6991XA Unspecified injury of right wrist, hand and finger(s), initial encounter: Secondary | ICD-10-CM | POA: Diagnosis present

## 2023-01-11 DIAGNOSIS — S5001XA Contusion of right elbow, initial encounter: Secondary | ICD-10-CM | POA: Diagnosis not present

## 2023-01-11 DIAGNOSIS — W1830XA Fall on same level, unspecified, initial encounter: Secondary | ICD-10-CM | POA: Insufficient documentation

## 2023-01-11 DIAGNOSIS — Z5321 Procedure and treatment not carried out due to patient leaving prior to being seen by health care provider: Secondary | ICD-10-CM | POA: Insufficient documentation

## 2023-01-11 DIAGNOSIS — S6291XA Unspecified fracture of right wrist and hand, initial encounter for closed fracture: Secondary | ICD-10-CM | POA: Insufficient documentation

## 2023-01-11 DIAGNOSIS — S62616A Displaced fracture of proximal phalanx of right little finger, initial encounter for closed fracture: Secondary | ICD-10-CM | POA: Diagnosis not present

## 2023-01-11 DIAGNOSIS — S52501A Unspecified fracture of the lower end of right radius, initial encounter for closed fracture: Secondary | ICD-10-CM

## 2023-01-11 NOTE — ED Triage Notes (Signed)
Pt presents to UC accompanied by caregiver. Caregiver states fall happened yesterday, she states she was not the caregiver present when fall occurred. Pt slipped on rug and possibly tried to catch himself, abrasion to RT elbow & RT hand pain

## 2023-01-11 NOTE — ED Triage Notes (Signed)
Pt fell yesterday and was seen at [facility] urgent care today.  Pt has fx in right hand/wrist.  Pt sent to er for eval.  Pt has ocl in place.  Care giver with pt.  Pt alert.

## 2023-01-11 NOTE — ED Provider Notes (Signed)
[facility]    CSN: [id number] Arrival date & time: 01/11/23  1645      History   Chief Complaint Chief Complaint  Patient presents with   Arm Injury    RT hand & forearm     HPI George [name] is a 89 y.o. male.   HPI  89 year old male with a past medical history significant for coronary artery disease with stent placement, dyspnea, colon cancer, BPH, hypertension, high cholesterol, GERD, and COPD presents for evaluation of pain and bruising to his right hand, wrist, and elbow after suffering a fall yesterday.  The patient does not remember the event and the caregiver that is with him was not with him yesterday when the fall occurred.  The current caregiver thinks that he tripped over a rug and tried to catch himself with his right arm.  Past Medical History:  Diagnosis Date   Anemia    Arthritis    BPH (benign prostatic hyperplasia)    Cancer    COLON   COPD (chronic obstructive pulmonary disease)    Coronary artery disease    WITH 1 STENT   Dyspnea    WITH EXERTION ONLY   GERD (gastroesophageal reflux disease)    HOH (hard of hearing)    Hypercholesteremia    Hypertension    Pneumonia 06/2021   Sleep apnea    HAD UPPP    Patient Active Problem List   Diagnosis Date Noted   Acute on chronic respiratory failure with hypoxia 06/08/2021   Gout 06/08/2021   Sepsis 06/08/2021   COPD exacerbation 06/08/2021   CAP (community acquired pneumonia) 06/08/2021   Status post reverse arthroplasty of shoulder, left 02/10/2021   Acute kidney injury superimposed on CKD IIIb ([place]) 11/04/2020   BPH with obstruction/lower urinary tract symptoms 11/04/2020   Hyperkalemia 11/04/2020   Acute urinary retention 11/04/2020   Pneumonia 06/25/2019   Foot pain 07/11/2018   Urinary urgency 06/27/2017   Erectile dysfunction 06/27/2017   Stage 3 acute kidney injury 06/27/2017   Hypogonadism in male 06/27/2017   Vomiting 02/09/2016   Slurred speech 02/09/2016   Nausea  and vomiting 02/09/2016   Lower abdominal pain 02/09/2016   Tobacco abuse 02/09/2016   Essential hypertension 02/09/2016   GERD (gastroesophageal reflux disease) 02/09/2016   Hyperlipidemia 04/22/2014   Hypertension 04/22/2014   COPD (chronic obstructive pulmonary disease) 04/22/2014   Type II diabetes mellitus with renal manifestations 04/22/2014   CAD (coronary artery disease) 09/10/1998    Past Surgical History:  Procedure Laterality Date   CARDIAC CATHETERIZATION     has a stent   COLON SURGERY     DUE TO COLON CANCER   COLONOSCOPY     MULTIPLE   EYE SURGERY Bilateral    CATARACT   HERNIA REPAIR     HOLEP-LASER ENUCLEATION OF THE PROSTATE WITH MORCELLATION N/A 08/21/2021   Procedure: HOLEP-LASER ENUCLEATION OF THE PROSTATE WITH MORCELLATION;  Surgeon: [name], MD;  Location: [facility] OR[facility];  Service: Urology;  Laterality: N/A;   REVERSE SHOULDER ARTHROPLASTY Left 02/10/2021   Procedure: REVERSE SHOULDER ARTHROPLASTY;  Surgeon: Corky Mull, MD;  Location: [facility];  Service: Orthopedics;  Laterality: Left;   TONSILLECTOMY     UVULOPALATOPHARYNGOPLASTY         Home Medications    Prior to Admission medications   Medication Sig Start Date End Date Taking? Authorizing Provider  albuterol (VENTOLIN HFA) 108 (90 Base) MCG/ACT inhaler Inhale 1-2 puffs into the lungs every  6 (six) hours as needed for wheezing or shortness of breath.    [provider]  aspirin EC 81 MG tablet Take 81 mg by mouth daily. Swallow whole.    [provider]  colchicine 0.6 MG tablet Take 0.5 tablets (0.3 mg total) by mouth daily. 06/17/21   Enzo Bi, MD  cyanocobalamin (,VITAMIN B-12,) 1000 MCG/ML injection Inject 1,000 mcg into the muscle every 30 (thirty) days.    [provider]  Fluticasone-Umeclidin-Vilant (TRELEGY ELLIPTA) 100-62.5-25 MCG/INH AEPB Inhale 1 puff into the lungs every morning.    [provider]  hyoscyamine (LEVSIN) 0.125 MG tablet Take  0.125 mg by mouth every 4 (four) hours as needed for cramping.    [provider]  lisinopril (PRINIVIL,ZESTRIL) 20 MG tablet Take 20 mg by mouth 2 (two) times daily.    [provider]  omeprazole (PRILOSEC) 20 MG capsule Take 20 mg by mouth every morning.    [provider]  simvastatin (ZOCOR) 20 MG tablet Take 10 mg by mouth at bedtime.    [provider]  testosterone cypionate (DEPOTESTOSTERONE CYPIONATE) 200 MG/ML injection Inject 200 mg into the muscle every 14 (fourteen) days.    [provider]  verapamil (CALAN-SR) 240 MG CR tablet Take 240 mg by mouth 2 (two) times daily.    [provider]    Family History Family History  Problem Relation Age of Onset   Prostate cancer Neg Hx    Bladder Cancer Neg Hx    Kidney cancer Neg Hx     Social History Social History   Tobacco Use   Smoking status: Every Day    Packs/day: 1.50    Years: 60.00    Additional pack years: 0.00    Total pack years: 90.00    Types: Cigarettes   Smokeless tobacco: Never  Vaping Use   Vaping Use: Never used  Substance Use Topics   Alcohol use: Not Currently    Comment: occ WINE   Drug use: No     Allergies   Patient has no known allergies.   Review of Systems Review of Systems  Musculoskeletal:  Positive for arthralgias, joint swelling and myalgias.  Skin:  Positive for color change and wound.  Neurological:  Negative for weakness and numbness.     Physical Exam Triage Vital Signs ED Triage Vitals  Enc Vitals Group     BP      Pulse      Resp      Temp      Temp src      SpO2      Weight      Height      Head Circumference      Peak Flow      Pain Score      Pain Loc      Pain Edu?      Excl. in [place]?    No data found.  Updated Vital Signs BP 102/61 (BP Location: Left Arm)   Pulse 82   Temp 98.5 F (36.9 C) (Oral)   SpO2 95%   Visual Acuity Right Eye Distance:   Left Eye Distance:   Bilateral Distance:     Right Eye Near:   Left Eye Near:    Bilateral Near:     Physical Exam Vitals and nursing note reviewed.  Constitutional:      Appearance: Normal appearance. He is not ill-appearing.  Musculoskeletal:  General: Swelling, tenderness, deformity and signs of injury present.  Skin:    General: Skin is warm and dry.     Capillary Refill: Capillary refill takes less than 2 seconds.     Findings: Bruising present.  Neurological:     Mental Status: He is alert.      UC Treatments / Results  Labs (all labs ordered are listed, but only abnormal results are displayed) Labs Reviewed - No data to display  EKG   Radiology DG Hand Complete Right  Result Date: 01/11/2023 CLINICAL DATA:  Pain and bruising, fall, pain in the small finger EXAM: RIGHT HAND - COMPLETE 3+ VIEW COMPARISON:  None Available. FINDINGS: Comminuted fracture of the proximal phalanx small finger involves the midshaft, proximal metaphysis, and with questionable extension to the proximal articular surface. Mild degenerative loss of articular space in the interphalangeal joints. Small linear calcification along the ulnar base of the proximal phalanx of the fourth finger is probably a physiologic calcification and less likely to be a small avulsion of the ulnar collateral ligament. Chondrocalcinosis along the TFCC disc and lunatotriquetral ligament. Degenerative loss of articular space at the radiocarpal joint. There is some mild spurring of the first carpometacarpal articulation. On the lateral projection, there is cortical discontinuity along the volar distal radius extending from the metaphysis to the distal articular surface. Articular involvement is also suggested on the oblique projection. Pronator fat pad indistinct in this region. IMPRESSION: 1. Comminuted fracture of the proximal phalanx of the small finger involving the midshaft, proximal metaphysis, and with questionable extension to the proximal articular surface.  2. Distal radial metaphyseal fracture as seen along the volar portion, extending to the distal articular surface. 3. Degenerative findings in the hand and wrist. 4. Chondrocalcinosis. Electronically Signed   By: Van Clines M.D.   On: 01/11/2023 17:51   DG Elbow Complete Right  Result Date: 01/11/2023 CLINICAL DATA:  Pain and bruising, swelling, fell EXAM: RIGHT ELBOW - COMPLETE 3+ VIEW COMPARISON:  None Available. FINDINGS: Frontal, bilateral oblique, and lateral views of the right elbow are obtained on 6 images. No acute fracture, subluxation, or dislocation. Mild osteoarthritis. No joint effusion. Prominent these a pathic changes along the olecranon. Soft tissues are unremarkable. IMPRESSION: 1. Mild osteoarthritis.  No acute fracture. Electronically Signed   By: Randa Ngo M.D.   On: 01/11/2023 17:51   DG Wrist Complete Right  Result Date: 01/11/2023 CLINICAL DATA:  Pain, bruising, and swelling status post fall. EXAM: RIGHT WRIST - COMPLETE 3+ VIEW COMPARISON:  None Available. FINDINGS: Diffuse osteopenia. Normal alignment. No fracture or dislocation. Carpal rows intact, with some chondrocalcinosis noted. Mild degenerative change at the STT and first [facility] joint. Regional soft tissues unremarkable. IMPRESSION: No acute findings. Osteopenia and degenerative changes as above. Electronically Signed   By: Lucrezia Europe M.D.   On: 01/11/2023 17:49    Procedures Procedures (including critical care time)  Medications Ordered in UC Medications - No data to display  Initial Impression / Assessment and Plan / UC Course  I have reviewed the triage vital signs and the nursing notes.  Pertinent labs & imaging results that were available during my care of the patient were reviewed by me and considered in my medical decision making (see chart for details).   Patient is a pleasant 89 year old male presenting for evaluation of pain, bruising, and swelling to his right hand, wrist, and elbow.  His most  significant pain is in his right fifth finger.  She can see the images above there is significant bruising to the lateral aspect of the elbow as well as the volar aspect of the right wrist, right palm and fifth finger, and the dorsal aspect of the right hand with swelling.  Patient has full sensation in his fingers and he has range of motion but he cannot make a complete fist secondary to the swelling.  He has pain and crepitus with palpation of the proximal phalanx of the right fifth finger but no pain with palpation of the IP joints or MCP joint.  No pain with palpation of the metacarpal.  Patient does have pain with compression of the radial and ulnar styloid but not with palpation of the volar aspect of the wrist and carpal bones.  He has no tenderness with palpation of the elbow.  I suspect that he has a fracture to his right fifth finger and I will obtain radiographs of the right hand, wrist, and elbow to look for any other bony abnormality.  Radiology impression of right hand films states there is a comminuted fracture of the proximal phalanx of the fifth finger involving the midshaft proximal metaphysis and with questionable extension into the proximal articular surface.  Distal radial metaphyseal fracture is seen along the volar portion extending to the distal articular surface.  Radiology impression of right elbow films states mild osteoarthritis but no fracture identified.  Given the comminuted fracture of the finger.  With displacement I will have the patient placed in an ulnar gutter splint for support and refer him to the ER for reduction of the fracture.   Final Clinical Impressions(s) / UC Diagnoses   Final diagnoses:  Closed fracture of distal end of right radius, unspecified fracture morphology, initial encounter  Closed displaced fracture of proximal phalanx of right little finger, initial encounter  Contusion of right elbow, initial encounter  Abrasion of right elbow,  initial encounter     Discharge Instructions      Please go to the emergency department for evaluation of your broken finger and reduction of the no fracture fragments.     ED Prescriptions   None    PDMP not reviewed this encounter.   Margarette Canada, NP 01/11/23 [phone]

## 2023-01-11 NOTE — ED Notes (Signed)
Patient is being discharged from the Urgent Care and sent to the Emergency Department via POV . Per Margarette Canada, NP patient is in need of higher level of care due to evaluation of your broken finger and reduction of the no fracture fragments. . Patient is aware and verbalizes understanding of plan of care.  Vitals:   01/11/23 1711  BP: 102/61  Pulse: 82  Temp: 98.5 F (36.9 C)  SpO2: 95%

## 2023-01-11 NOTE — Discharge Instructions (Signed)
Please go to the emergency department for evaluation of your broken finger and reduction of the no fracture fragments.

## 2023-01-25 ENCOUNTER — Observation Stay
Admission: EM | Admit: 2023-01-25 | Discharge: 2023-01-29 | DRG: 603 | Disposition: A | Discharge status: Home Health | Payer: Medicare Other | Attending: Internal Medicine | Admitting: Internal Medicine

## 2023-01-25 ENCOUNTER — Other Ambulatory Visit: Payer: Self-pay

## 2023-01-25 ENCOUNTER — Emergency Department: Payer: Medicare Other

## 2023-01-25 ENCOUNTER — Inpatient Hospital Stay: Payer: Medicare Other

## 2023-01-25 ENCOUNTER — Encounter: Payer: Self-pay | Admitting: Intensive Care

## 2023-01-25 DIAGNOSIS — Z9841 Cataract extraction status, right eye: Secondary | ICD-10-CM

## 2023-01-25 DIAGNOSIS — K219 Gastro-esophageal reflux disease without esophagitis: Secondary | ICD-10-CM | POA: Diagnosis present

## 2023-01-25 DIAGNOSIS — E875 Hyperkalemia: Secondary | ICD-10-CM | POA: Insufficient documentation

## 2023-01-25 DIAGNOSIS — F17210 Nicotine dependence, cigarettes, uncomplicated: Secondary | ICD-10-CM | POA: Diagnosis not present

## 2023-01-25 DIAGNOSIS — Z7982 Long term (current) use of aspirin: Secondary | ICD-10-CM

## 2023-01-25 DIAGNOSIS — Z955 Presence of coronary angioplasty implant and graft: Secondary | ICD-10-CM | POA: Diagnosis not present

## 2023-01-25 DIAGNOSIS — N1832 Chronic kidney disease, stage 3b: Secondary | ICD-10-CM | POA: Insufficient documentation

## 2023-01-25 DIAGNOSIS — I10 Essential (primary) hypertension: Secondary | ICD-10-CM | POA: Diagnosis present

## 2023-01-25 DIAGNOSIS — W19XXXD Unspecified fall, subsequent encounter: Secondary | ICD-10-CM | POA: Diagnosis present

## 2023-01-25 DIAGNOSIS — E7800 Pure hypercholesterolemia, unspecified: Secondary | ICD-10-CM | POA: Diagnosis present

## 2023-01-25 DIAGNOSIS — N400 Enlarged prostate without lower urinary tract symptoms: Secondary | ICD-10-CM | POA: Diagnosis not present

## 2023-01-25 DIAGNOSIS — G4733 Obstructive sleep apnea (adult) (pediatric): Secondary | ICD-10-CM | POA: Diagnosis present

## 2023-01-25 DIAGNOSIS — Z79899 Other long term (current) drug therapy: Secondary | ICD-10-CM | POA: Diagnosis not present

## 2023-01-25 DIAGNOSIS — Z9842 Cataract extraction status, left eye: Secondary | ICD-10-CM

## 2023-01-25 DIAGNOSIS — R531 Weakness: Secondary | ICD-10-CM

## 2023-01-25 DIAGNOSIS — Z85038 Personal history of other malignant neoplasm of large intestine: Secondary | ICD-10-CM

## 2023-01-25 DIAGNOSIS — E785 Hyperlipidemia, unspecified: Secondary | ICD-10-CM | POA: Diagnosis not present

## 2023-01-25 DIAGNOSIS — L03019 Cellulitis of unspecified finger: Secondary | ICD-10-CM | POA: Diagnosis present

## 2023-01-25 DIAGNOSIS — L03011 Cellulitis of right finger: Principal | ICD-10-CM | POA: Insufficient documentation

## 2023-01-25 DIAGNOSIS — Z96612 Presence of left artificial shoulder joint: Secondary | ICD-10-CM | POA: Diagnosis present

## 2023-01-25 DIAGNOSIS — J449 Chronic obstructive pulmonary disease, unspecified: Secondary | ICD-10-CM | POA: Diagnosis not present

## 2023-01-25 DIAGNOSIS — S62616D Displaced fracture of proximal phalanx of right little finger, subsequent encounter for fracture with routine healing: Secondary | ICD-10-CM

## 2023-01-25 DIAGNOSIS — Z7951 Long term (current) use of inhaled steroids: Secondary | ICD-10-CM | POA: Diagnosis not present

## 2023-01-25 DIAGNOSIS — N189 Chronic kidney disease, unspecified: Secondary | ICD-10-CM | POA: Diagnosis present

## 2023-01-25 DIAGNOSIS — R7989 Other specified abnormal findings of blood chemistry: Secondary | ICD-10-CM | POA: Diagnosis present

## 2023-01-25 DIAGNOSIS — Z8781 Personal history of (healed) traumatic fracture: Secondary | ICD-10-CM

## 2023-01-25 DIAGNOSIS — I2510 Atherosclerotic heart disease of native coronary artery without angina pectoris: Secondary | ICD-10-CM | POA: Diagnosis not present

## 2023-01-25 DIAGNOSIS — N179 Acute kidney failure, unspecified: Secondary | ICD-10-CM | POA: Diagnosis not present

## 2023-01-25 DIAGNOSIS — L89899 Pressure ulcer of other site, unspecified stage: Secondary | ICD-10-CM | POA: Diagnosis present

## 2023-01-25 DIAGNOSIS — I129 Hypertensive chronic kidney disease with stage 1 through stage 4 chronic kidney disease, or unspecified chronic kidney disease: Secondary | ICD-10-CM | POA: Diagnosis present

## 2023-01-25 DIAGNOSIS — D631 Anemia in chronic kidney disease: Secondary | ICD-10-CM | POA: Diagnosis present

## 2023-01-25 DIAGNOSIS — H9190 Unspecified hearing loss, unspecified ear: Secondary | ICD-10-CM | POA: Diagnosis present

## 2023-01-25 DIAGNOSIS — M79644 Pain in right finger(s): Secondary | ICD-10-CM | POA: Diagnosis present

## 2023-01-25 DIAGNOSIS — S52513D Displaced fracture of unspecified radial styloid process, subsequent encounter for closed fracture with routine healing: Secondary | ICD-10-CM

## 2023-01-25 DIAGNOSIS — E1122 Type 2 diabetes mellitus with diabetic chronic kidney disease: Secondary | ICD-10-CM | POA: Diagnosis present

## 2023-01-25 DIAGNOSIS — J9611 Chronic respiratory failure with hypoxia: Secondary | ICD-10-CM | POA: Insufficient documentation

## 2023-01-25 DIAGNOSIS — R200 Anesthesia of skin: Secondary | ICD-10-CM | POA: Diagnosis present

## 2023-01-25 DIAGNOSIS — R400 Somnolence: Secondary | ICD-10-CM | POA: Diagnosis not present

## 2023-01-25 LAB — CBC WITH DIFFERENTIAL/PLATELET
Abs Immature Granulocytes: 0.19 10*3/uL — ABNORMAL HIGH (ref 0.00–0.07)
Basophils Absolute: 0.1 10*3/uL (ref 0.0–0.1)
Basophils Relative: 0 %
Eosinophils Absolute: 0.2 10*3/uL (ref 0.0–0.5)
Eosinophils Relative: 1 %
HCT: 34.6 % — ABNORMAL LOW (ref 39.0–52.0)
Hemoglobin: 11.3 g/dL — ABNORMAL LOW (ref 13.0–17.0)
Immature Granulocytes: 1 %
Lymphocytes Relative: 8 %
Lymphs Abs: 1.4 10*3/uL (ref 0.7–4.0)
MCH: 30.8 pg (ref 26.0–34.0)
MCHC: 32.7 g/dL (ref 30.0–36.0)
MCV: 94.3 fL (ref 80.0–100.0)
Monocytes Absolute: 1.3 10*3/uL — ABNORMAL HIGH (ref 0.1–1.0)
Monocytes Relative: 8 %
Neutro Abs: 13.9 10*3/uL — ABNORMAL HIGH (ref 1.7–7.7)
Neutrophils Relative %: 82 %
Platelets: 321 10*3/uL (ref 150–400)
RBC: 3.67 MIL/uL — ABNORMAL LOW (ref 4.22–5.81)
RDW: 13.8 % (ref 11.5–15.5)
WBC: 17 10*3/uL — ABNORMAL HIGH (ref 4.0–10.5)
nRBC: 0 % (ref 0.0–0.2)

## 2023-01-25 LAB — COMPREHENSIVE METABOLIC PANEL
ALT: 271 U/L — ABNORMAL HIGH (ref 0–44)
AST: 46 U/L — ABNORMAL HIGH (ref 15–41)
Albumin: 3.1 g/dL — ABNORMAL LOW (ref 3.5–5.0)
Alkaline Phosphatase: 91 U/L (ref 38–126)
Anion gap: 11 (ref 5–15)
BUN: 74 mg/dL — ABNORMAL HIGH (ref 8–23)
CO2: 22 mmol/L (ref 22–32)
Calcium: 8.9 mg/dL (ref 8.9–10.3)
Chloride: 104 mmol/L (ref 98–111)
Creatinine, Ser: 2.48 mg/dL — ABNORMAL HIGH (ref 0.61–1.24)
GFR, Estimated: 24 mL/min — ABNORMAL LOW (ref >60)
Glucose, Bld: 148 mg/dL — ABNORMAL HIGH (ref 70–99)
Potassium: 5.8 mmol/L — ABNORMAL HIGH (ref 3.5–5.1)
Sodium: 137 mmol/L (ref 135–145)
Total Bilirubin: 0.8 mg/dL (ref 0.3–1.2)
Total Protein: 6.5 g/dL (ref 6.5–8.1)

## 2023-01-25 LAB — URINALYSIS, ROUTINE W REFLEX MICROSCOPIC
Bilirubin Urine: NEGATIVE
Glucose, UA: NEGATIVE mg/dL
Hgb urine dipstick: NEGATIVE
Ketones, ur: NEGATIVE mg/dL
Nitrite: NEGATIVE
Protein, ur: 100 mg/dL — AB
Specific Gravity, Urine: 1.019 (ref 1.005–1.030)
pH: 5 (ref 5.0–8.0)

## 2023-01-25 MED ORDER — SODIUM CHLORIDE 0.9 % IV SOLN
3.0000 g | Freq: Once | INTRAVENOUS | Status: AC
  Administered 2023-01-25: 3 g via INTRAVENOUS
  Filled 2023-01-25: qty 8

## 2023-01-25 MED ORDER — SODIUM CHLORIDE 0.9 % IV BOLUS
1000.0000 mL | Freq: Once | INTRAVENOUS | Status: AC
  Administered 2023-01-25: 1000 mL via INTRAVENOUS

## 2023-01-25 MED ORDER — ACETAMINOPHEN 650 MG RE SUPP: 650.0000 mg | Freq: Four times a day (QID) | RECTAL | Status: DC | PRN

## 2023-01-25 MED ORDER — ASPIRIN 81 MG PO TBEC
81.0000 mg | DELAYED_RELEASE_TABLET | Freq: Every day | ORAL | Status: DC
  Administered 2023-01-26 – 2023-01-29 (×4): 81 mg via ORAL
  Filled 2023-01-25 (×4): qty 1

## 2023-01-25 MED ORDER — ONDANSETRON HCL 4 MG/2ML IJ SOLN: 4.0000 mg | Freq: Four times a day (QID) | INTRAMUSCULAR | Status: DC | PRN

## 2023-01-25 MED ORDER — ALBUTEROL SULFATE (2.5 MG/3ML) 0.083% IN NEBU: 2.5000 mg | INHALATION_SOLUTION | Freq: Four times a day (QID) | RESPIRATORY_TRACT | Status: DC | PRN

## 2023-01-25 MED ORDER — SODIUM CHLORIDE 0.9 % IV SOLN
3.0000 g | Freq: Two times a day (BID) | INTRAVENOUS | Status: DC
  Administered 2023-01-26 – 2023-01-29 (×7): 3 g via INTRAVENOUS
  Filled 2023-01-25: qty 8
  Filled 2023-01-25: qty 3
  Filled 2023-01-25: qty 8
  Filled 2023-01-25: qty 3
  Filled 2023-01-25: qty 8
  Filled 2023-01-25: qty 3
  Filled 2023-01-25: qty 8

## 2023-01-25 MED ORDER — UMECLIDINIUM BROMIDE 62.5 MCG/ACT IN AEPB
1.0000 | INHALATION_SPRAY | Freq: Every day | RESPIRATORY_TRACT | Status: DC
  Administered 2023-01-27 – 2023-01-29 (×3): 1 via RESPIRATORY_TRACT
  Filled 2023-01-25 (×2): qty 7

## 2023-01-25 MED ORDER — ENOXAPARIN SODIUM 30 MG/0.3ML IJ SOSY
30.0000 mg | PREFILLED_SYRINGE | INTRAMUSCULAR | Status: DC
  Administered 2023-01-26 – 2023-01-29 (×4): 30 mg via SUBCUTANEOUS
  Filled 2023-01-25 (×4): qty 0.3

## 2023-01-25 MED ORDER — HYDROCODONE-ACETAMINOPHEN 5-325 MG PO TABS
1.0000 | ORAL_TABLET | ORAL | Status: DC | PRN
  Administered 2023-01-26: 2 via ORAL
  Administered 2023-01-28: 1 via ORAL
  Filled 2023-01-25 (×3): qty 1
  Filled 2023-01-25: qty 2

## 2023-01-25 MED ORDER — PANTOPRAZOLE SODIUM 40 MG PO TBEC
40.0000 mg | DELAYED_RELEASE_TABLET | Freq: Every day | ORAL | Status: DC
  Administered 2023-01-26 – 2023-01-29 (×4): 40 mg via ORAL
  Filled 2023-01-25 (×4): qty 1

## 2023-01-25 MED ORDER — SIMVASTATIN 20 MG PO TABS
10.0000 mg | ORAL_TABLET | Freq: Every day | ORAL | Status: DC
  Administered 2023-01-26 – 2023-01-28 (×3): 10 mg via ORAL
  Filled 2023-01-25 (×3): qty 1

## 2023-01-25 MED ORDER — ACETAMINOPHEN 325 MG PO TABS
650.0000 mg | ORAL_TABLET | Freq: Four times a day (QID) | ORAL | Status: DC | PRN
  Administered 2023-01-28 – 2023-01-29 (×2): 650 mg via ORAL
  Filled 2023-01-25 (×2): qty 2

## 2023-01-25 MED ORDER — SODIUM ZIRCONIUM CYCLOSILICATE 10 G PO PACK
10.0000 g | PACK | Freq: Once | ORAL | Status: DC
  Filled 2023-01-25: qty 1

## 2023-01-25 MED ORDER — ONDANSETRON HCL 4 MG PO TABS: 4.0000 mg | ORAL_TABLET | Freq: Four times a day (QID) | ORAL | Status: DC | PRN

## 2023-01-25 MED ORDER — FLUTICASONE FUROATE-VILANTEROL 100-25 MCG/ACT IN AEPB
1.0000 | INHALATION_SPRAY | Freq: Every day | RESPIRATORY_TRACT | Status: DC
  Administered 2023-01-27 – 2023-01-29 (×3): 1 via RESPIRATORY_TRACT
  Filled 2023-01-25: qty 28

## 2023-01-25 MED ORDER — VANCOMYCIN HCL IN DEXTROSE 1-5 GM/200ML-% IV SOLN
1000.0000 mg | Freq: Once | INTRAVENOUS | Status: AC
  Administered 2023-01-25: 1000 mg via INTRAVENOUS
  Filled 2023-01-25: qty 200

## 2023-01-25 MED ORDER — MORPHINE SULFATE (PF) 2 MG/ML IV SOLN: 2.0000 mg | INTRAVENOUS | Status: DC | PRN

## 2023-01-25 MED ORDER — ALBUTEROL SULFATE HFA 108 (90 BASE) MCG/ACT IN AERS: 1.0000 | INHALATION_SPRAY | Freq: Four times a day (QID) | RESPIRATORY_TRACT | Status: DC | PRN

## 2023-01-25 NOTE — Assessment & Plan Note (Signed)
Stable, no chest pain or acute EKG changes. --Continue aspirin, simvastatin and lisinopril

## 2023-01-25 NOTE — Assessment & Plan Note (Signed)
Soft BP's on admission --Holding verapamil, lisinopril for now --Resume meds as BP tolerates

## 2023-01-25 NOTE — ED Notes (Signed)
Caitlin, EDT in room to apply splint. Pt tolerated without difficulty

## 2023-01-25 NOTE — Progress Notes (Signed)
Pharmacy Antibiotic Note  George [name] is a 89 y.o. male admitted on 01/25/2023 with cellulitis.  Pharmacy has been consulted for Unasyn & Vancomycin dosing x 7 days.  Plan: Vancomycin *** q***hr per indication & renal fxn. {Assessment:21075}  Pt given Vancomycin *** mg once. Vancomycin *** mg IV Q *** hrs. Goal AUC 400-550. Expected AUC: *** SCr used: ***, Vd used: ***, BMI: ***, TBW *** kg < IBW *** kg  Pharmacy will continue to follow and will adjust abx dosing whenever warranted.  Temp (24hrs), Avg:98.4 F (36.9 C), Min:98.3 F (36.8 C), Max:98.5 F (36.9 C)   Recent Labs  Lab 01/25/23 1746  WBC 17.0*  CREATININE 2.48*    Estimated Creatinine Clearance: 16.8 mL/min (A) (by C-G formula based on SCr of 2.48 mg/dL (H)).    No Known Allergies  Antimicrobials this admission: *** *** >> *** *** *** >> *** *** *** >> ***  Microbiology results: *** BCx: *** *** UCx: ***  *** ***Cx: *** *** Sputum: ***  *** MRSA PCR: *** No lab cx currently ordered or pending at this time.  Thank you for allowing pharmacy to be a part of this patient's care.  Otelia Sergeant, PharmD, [facility] 01/25/2023 11:31 PM

## 2023-01-25 NOTE — ED Triage Notes (Signed)
Patient presents with right hand, ring finger redness and open wound. Patient has cast on from fractured pinky that was placed 01/18/23.

## 2023-01-25 NOTE — Progress Notes (Incomplete)
Pharmacy Antibiotic Note  George [name] is a 89 y.o. male admitted on 01/25/2023 with cellulitis.  Pharmacy has been consulted for Unasyn & Vancomycin dosing x 7 days.  Plan: Unasyn 3 gm q12hr per indication & renal fxn.  Pt given Vancomycin 1000 mg once. Will order additional Vancomycin 500 mg once, followed by Vancomycin 750 mg IV Q 48 hrs. Goal AUC 400-550. Expected AUC: 483.0 SCr used: 2.48, TBW 58.8 kg < IBW 73 kg  Pharmacy will continue to follow and will adjust abx dosing whenever warranted.  Temp (24hrs), Avg:98.4 F (36.9 C), Min:98.3 F (36.8 C), Max:98.5 F (36.9 C)   Recent Labs  Lab 01/25/23 1746  WBC 17.0*  CREATININE 2.48*    Estimated Creatinine Clearance: 16.8 mL/min (A) (by C-G formula based on SCr of 2.48 mg/dL (H)).    No Known Allergies  Antimicrobials this admission: 4/16 Unasyn >> x 7 days 4/16 Vancomycin >> x 7 days  Microbiology results: *** BCx: *** *** UCx: ***  *** ***Cx: *** *** Sputum: ***  *** MRSA PCR: *** No lab cx currently ordered or pending at this time.  Thank you for allowing pharmacy to be a part of this patient's care.  Otelia Sergeant, PharmD, [facility] 01/25/2023 11:31 PM

## 2023-01-25 NOTE — Assessment & Plan Note (Signed)
Chronic respiratory failure Not acutely exacerbated Continue home inhalers with DuoNebs as needed Supplement O2, target sats 88-94%

## 2023-01-25 NOTE — ED Provider Notes (Signed)
[facility] Provider Note    Event Date/Time   First MD Initiated Contact with Patient 01/25/23 1951     (approximate)   History   Chief Complaint Finger Injury   HPI  George [name] is a 89 y.o. male with past medical history of hypertension, hyperlipidemia, CAD, diabetes COPD, and CKD who presents to the ED complaining of finger pain.  Patient initially had a fall about 2 weeks ago onto his right arm, resulting in distal radius fracture along with fracture of his pinky finger.  He followed up with [facility] orthopedics and had cast placed 1 week ago.  Since then, caregiver has noticed increased redness and swelling of his right ring finger.  Patient states that the finger is sore to the touch and painful when he tries to move it.  Caregiver additionally states that patient has been generally declining since his prior fall with generalized weakness and poor p.o. intake.  He has not had any fevers or confusion, does endorse cough and some acute on chronic difficulty breathing.  He denies any pain in his chest, abdomen, nausea, vomiting, or diarrhea.     Physical Exam   Triage Vital Signs: ED Triage Vitals  Enc Vitals Group     BP 01/25/23 1742 (!) 111/57     Pulse Rate 01/25/23 1742 64     Resp 01/25/23 1742 18     Temp 01/25/23 1737 98.3 F (36.8 C)     Temp Source 01/25/23 1737 Oral     SpO2 01/25/23 1742 93 %     Weight 01/25/23 1743 129 lb 9.6 oz (58.8 kg)     Height 01/25/23 1743  (1.778 m)     Head Circumference --      Peak Flow --      Pain Score 01/25/23 1739 0     Pain Loc --      Pain Edu? --      Excl. in [place]? --     Most recent vital signs: Vitals:   01/25/23 1742 01/25/23 2200  BP: (!) 111/57 119/61  Pulse: 64 71  Resp: 18 19  Temp:  98.5 F (36.9 C)  SpO2: 93% 95%    Constitutional: Alert and oriented. Eyes: Conjunctivae are normal. Head: Atraumatic. Nose: No congestion/rhinnorhea. Mouth/Throat: Mucous membranes are  moist.  Cardiovascular: Normal rate, regular rhythm. Grossly normal heart sounds.  2+ radial pulses bilaterally. Respiratory: Normal respiratory effort.  No retractions. Lungs CTAB. Gastrointestinal: Soft and nontender. No distention. Musculoskeletal: Cast in place to right forearm, exposed ring finger erythematous and edematous with ulceration to the ventral portion of the DIP, purulent drainage noted.  Picture below of wound following cast removal. Neurologic:  Normal speech and language. No gross focal neurologic deficits are appreciated.     ED Results / Procedures / Treatments   Labs (all labs ordered are listed, but only abnormal results are displayed) Labs Reviewed  COMPREHENSIVE METABOLIC PANEL - Abnormal; Notable for the following components:      Result Value   Potassium 5.8 (*)    Glucose, Bld 148 (*)    BUN 74 (*)    Creatinine, Ser 2.48 (*)    Albumin 3.1 (*)    AST 46 (*)    ALT 271 (*)    GFR, Estimated 24 (*)    All other components within normal limits  CBC WITH DIFFERENTIAL/PLATELET - Abnormal; Notable for the following components:   WBC 17.0 (*)  RBC 3.67 (*)    Hemoglobin 11.3 (*)    HCT 34.6 (*)    Neutro Abs 13.9 (*)    Monocytes Absolute 1.3 (*)    Abs Immature Granulocytes 0.19 (*)    All other components within normal limits  URINALYSIS, ROUTINE W REFLEX MICROSCOPIC - Abnormal; Notable for the following components:   Color, Urine AMBER (*)    APPearance CLOUDY (*)    Protein, ur 100 (*)    Leukocytes,Ua MODERATE (*)    Bacteria, UA RARE (*)    All other components within normal limits  CULTURE, BLOOD (SINGLE)  AEROBIC CULTURE W GRAM STAIN (SUPERFICIAL SPECIMEN)  CBC  CREATININE, SERUM  BASIC METABOLIC PANEL  CBC     EKG  ED ECG REPORT I, Chesley Noon, the attending physician, personally viewed and interpreted this ECG.   Date: 01/25/2023  EKG Time: 20:51  Rate: 83  Rhythm: normal sinus rhythm  Axis: LAD  Intervals:first-degree  A-V block  and right bundle branch block  ST&T Change: None  RADIOLOGY Chest x-ray reviewed and interpreted by me with no infiltrate, edema, or effusion.  PROCEDURES:  Critical Care performed: No  Procedures   MEDICATIONS ORDERED IN ED: Medications  sodium zirconium cyclosilicate (LOKELMA) packet 10 g (has no administration in time range)  aspirin EC tablet 81 mg (has no administration in time range)  simvastatin (ZOCOR) tablet 10 mg (has no administration in time range)  pantoprazole (PROTONIX) EC tablet 40 mg (has no administration in time range)  fluticasone furoate-vilanterol (BREO ELLIPTA) 100-25 MCG/ACT 1 puff (has no administration in time range)    And  umeclidinium bromide (INCRUSE ELLIPTA) 62.5 MCG/ACT 1 puff (has no administration in time range)  acetaminophen (TYLENOL) tablet 650 mg (has no administration in time range)    Or  acetaminophen (TYLENOL) suppository 650 mg (has no administration in time range)  ondansetron (ZOFRAN) tablet 4 mg (has no administration in time range)    Or  ondansetron (ZOFRAN) injection 4 mg (has no administration in time range)  enoxaparin (LOVENOX) injection 30 mg (has no administration in time range)  HYDROcodone-acetaminophen (NORCO/VICODIN) 5-325 MG per tablet 1-2 tablet (has no administration in time range)  morphine (PF) 2 MG/ML injection 2 mg (has no administration in time range)  albuterol (PROVENTIL) (2.5 MG/3ML) 0.083% nebulizer solution 2.5 mg (has no administration in time range)  Ampicillin-Sulbactam (UNASYN) 3 g in sodium chloride 0.9 % 100 mL IVPB (has no administration in time range)  sodium chloride 0.9 % bolus 1,000 mL (0 mLs Intravenous Stopped 01/25/23 2142)  Ampicillin-Sulbactam (UNASYN) 3 g in sodium chloride 0.9 % 100 mL IVPB (0 g Intravenous Stopped 01/25/23 2229)  vancomycin (VANCOCIN) IVPB 1000 mg/200 mL premix (0 mg Intravenous Stopped 01/25/23 2338)     IMPRESSION / MDM / ASSESSMENT AND PLAN / ED COURSE  I  reviewed the triage vital signs and the nursing notes.                              89 y.o. male with past medical history of hypertension, hyperlipidemia, diabetes, CAD, CKD, COPD who presents to the ED complaining of right ring finger pain and swelling following recent cast placement for forearm fracture.  Patient's presentation is most consistent with acute presentation with potential threat to life or bodily function.  Differential diagnosis includes, but is not limited to, cellulitis, abscess, flexor tenosynovitis, sepsis, AKI, pneumonia, UTI.  Patient ill-appearing but  in no acute distress, vital signs are unremarkable and do not appear concerning for sepsis.  He does have obvious developing infection to his right ring finger, appears to a started from where cast was rubbing against the area of his DIP, where there is ulceration and purulence.  Cast was removed with cast cutter, erythema and edema extends through the proximal two thirds of the finger but does not seem to involve the proximal third, lower suspicion for flexor tenosynovitis at this time.  He would benefit from IV antibiotics and will start on Unasyn and vancomycin.  Labs remarkable for AKI with mild hyperkalemia, no associated EKG changes noted.  We will hydrate with IV fluids and give dose of Lokelma.  Patient also with leukocytosis consistent with infection, no significant anemia noted, he does have a mild transaminitis but no apparent abdominal pain.  Urinalysis borderline for infection, but appears to be a dirty sample.  Case discussed with Dr. Martha Clan of orthopedics, who agrees with plan for admission for IV antibiotics, will follow during admission.  We will replace his cast with a sugar-tong splint for now, wound was irrigated and sterile dressing placed.      FINAL CLINICAL IMPRESSION(S) / ED DIAGNOSES   Final diagnoses:  Cellulitis of finger of right hand  AKI (acute kidney injury)  Generalized weakness     Rx  / DC Orders   ED Discharge Orders     None        Note:  This document was prepared using Dragon voice recognition software and may include unintentional dictation errors.   Chesley Noon, MD 01/25/23 [id number]

## 2023-01-25 NOTE — Assessment & Plan Note (Addendum)
Creatinine 2.48 on admission, up from baseline of 1.57, presumed prerenal azotemia. 4/19: Cr improved to 1.74 with fluids. --Stop IV fluids --Encourage PO hydration --Monitor BMP

## 2023-01-25 NOTE — Assessment & Plan Note (Addendum)
History of fracture right fifth finger 01/18/2023 s/p cast removal on 416/24 Finger ulcer likely secondary to pressure and/or friction from cast, resulting in cellulitis --Treated with empiric IV Unasyn and vancomycin --Discharge on PO Augmentin and Doxy --Follow-up wound and blood cultures Wound culture with few staph lugdunensis within mixed skin flora -- susceptibilities pending --Orthopedic sugery consulted - see their recommendations.   --Non-operative management with R 5th finger splinted, R 4th finger dressing changes PRN. --Pain control PRN

## 2023-01-25 NOTE — H&P (Signed)
History and Physical    Patient: George [name] [id number]:[id number] DOB: 06/17/1932 DOA: 01/25/2023 DOS: the patient was seen and examined on 01/25/2023 PCP: [name], MD  Patient coming from: Home  Chief Complaint:  Chief Complaint  Patient presents with   Finger Injury    HPI: George [name] is a 89 y.o. male with medical history significant for CKD-IIIb, hypertension, COPD, OSA, CAD, stent placement, colon cancer, BPH, who presented to the ED with an ulcer of the right fourth finger associated with pain redness and swelling of the finger.  Patient sustained fracture of the right fifth finger on 4/9 and had been placed in a cast at [facility].  Due to weather cast was rubbing against his fourth finger he developed an ulcer and then over the past few days he started having redness with increased pain and swelling of the finger.  He denies pain on flexion.  He denies fever or chills. ED course and data review: BP 111/57 with otherwise normal vitals.  WBC 17,000With hemoglobin of 11.3 (baseline) creatinine 2.48 (baseline 1.57) with potassium 5.8.  Mildly elevated LFTs with AST 46 and ALT 271. EKG, personally viewed and interpreted shows sinus at 83 with nonspecific ST-T wave changes Chest x-ray clear Right wrist and hand x-ray pending The ED provider spoke with on-call orthopedist Dr. Martha Clan on antibiotics and states will see patient in the a.m. Patient was started on vancomycin.  Lokelma for hyperkalemia Cast was removed and patient was placed in a sugar-tong splint Hospitalist consulted for admission.   Review of Systems: As mentioned in the history of present illness. All other systems reviewed and are negative.  Past Medical History:  Diagnosis Date   Anemia    Arthritis    BPH (benign prostatic hyperplasia)    Cancer    COLON   COPD (chronic obstructive pulmonary disease)    Coronary artery disease    WITH 1 STENT   Dyspnea    WITH EXERTION ONLY   GERD (gastroesophageal  reflux disease)    HOH (hard of hearing)    Hypercholesteremia    Hypertension    Pneumonia 06/2021   Sleep apnea    HAD UPPP   Past Surgical History:  Procedure Laterality Date   CARDIAC CATHETERIZATION     has a stent   COLON SURGERY     DUE TO COLON CANCER   COLONOSCOPY     MULTIPLE   EYE SURGERY Bilateral    CATARACT   HERNIA REPAIR     HOLEP-LASER ENUCLEATION OF THE PROSTATE WITH MORCELLATION N/A 08/21/2021   Procedure: HOLEP-LASER ENUCLEATION OF THE PROSTATE WITH MORCELLATION;  Surgeon: Sondra Come, MD;  Location: [facility] ORS;  Service: Urology;  Laterality: N/A;   REVERSE SHOULDER ARTHROPLASTY Left 02/10/2021   Procedure: REVERSE SHOULDER ARTHROPLASTY;  Surgeon: Christena Flake, MD;  Location: [facility] ORS;  Service: Orthopedics;  Laterality: Left;   TONSILLECTOMY     UVULOPALATOPHARYNGOPLASTY     Social History:  reports that he has been smoking cigarettes. He has a 90.00 pack-year smoking history. He has never used smokeless tobacco. He reports that he does not currently use alcohol. He reports that he does not use drugs.  No Known Allergies  Family History  Problem Relation Age of Onset   Prostate cancer Neg Hx    Bladder Cancer Neg Hx    Kidney cancer Neg Hx     Prior to Admission medications   Medication Sig Start Date End Date Taking? Authorizing  Provider  albuterol (VENTOLIN HFA) 108 (90 Base) MCG/ACT inhaler Inhale 1-2 puffs into the lungs every 6 (six) hours as needed for wheezing or shortness of breath.    [provider]  aspirin EC 81 MG tablet Take 81 mg by mouth daily. Swallow whole.    [provider]  colchicine 0.6 MG tablet Take 0.5 tablets (0.3 mg total) by mouth daily. 06/17/21   Darlin Priestly, MD  cyanocobalamin (,VITAMIN B-12,) 1000 MCG/ML injection Inject 1,000 mcg into the muscle every 30 (thirty) days.    [provider]  Fluticasone-Umeclidin-Vilant (TRELEGY ELLIPTA) 100-62.5-25 MCG/INH AEPB Inhale 1 puff into the lungs every  morning.    [provider]  hyoscyamine (LEVSIN) 0.125 MG tablet Take 0.125 mg by mouth every 4 (four) hours as needed for cramping.    [provider]  lisinopril (PRINIVIL,ZESTRIL) 20 MG tablet Take 20 mg by mouth 2 (two) times daily.    [provider]  omeprazole (PRILOSEC) 20 MG capsule Take 20 mg by mouth every morning.    [provider]  simvastatin (ZOCOR) 20 MG tablet Take 10 mg by mouth at bedtime.    [provider]  testosterone cypionate (DEPOTESTOSTERONE CYPIONATE) 200 MG/ML injection Inject 200 mg into the muscle every 14 (fourteen) days.    [provider]  verapamil (CALAN-SR) 240 MG CR tablet Take 240 mg by mouth 2 (two) times daily.    [provider]    Physical Exam: Vitals:   01/25/23 1737 01/25/23 1742 01/25/23 1743 01/25/23 2200  BP:  (!) 111/57  119/61  Pulse:  64  71  Resp:  18  19  Temp: 98.3 F (36.8 C)   98.5 F (36.9 C)  TempSrc: Oral   Oral  SpO2:  93%  95%  Weight:   58.8 kg   Height:   5\' 10"  (1.778 m)    Physical Exam Vitals and nursing note reviewed.  Constitutional:      General: He is not in acute distress. HENT:     Head: Normocephalic and atraumatic.  Cardiovascular:     Rate and Rhythm: Normal rate and regular rhythm.     Heart sounds: Normal heart sounds.  Pulmonary:     Effort: Pulmonary effort is normal.     Breath sounds: Normal breath sounds.  Abdominal:     Palpations: Abdomen is soft.     Tenderness: There is no abdominal tenderness.  Neurological:     Mental Status: Mental status is at baseline.     Labs on Admission: I have personally reviewed following labs and imaging studies  CBC: Recent Labs  Lab 01/25/23 1746  WBC 17.0*  NEUTROABS 13.9*  HGB 11.3*  HCT 34.6*  MCV 94.3  PLT 321   Basic Metabolic Panel: Recent Labs  Lab 01/25/23 1746  NA 137  K 5.8*  CL 104  CO2 22  GLUCOSE 148*  BUN 74*  CREATININE 2.48*  CALCIUM 8.9    GFR: Estimated Creatinine Clearance: 16.8 mL/min (A) (by C-G formula based on SCr of 2.48 mg/dL (H)). Liver Function Tests: Recent Labs  Lab 01/25/23 1746  AST 46*  ALT 271*  ALKPHOS 91  BILITOT 0.8  PROT 6.5  ALBUMIN 3.1*   No results for input(s): "LIPASE", "AMYLASE" in the last 168 hours. No results for input(s): "AMMONIA" in the last 168 hours. Coagulation Profile: No results for input(s): "INR", "PROTIME" in the last 168 hours. Cardiac Enzymes: No results for input(s): "CKTOTAL", "CKMB", "  CKMBINDEX", "TROPONINI" in the last 168 hours. BNP (last 3 results) No results for input(s): "PROBNP" in the last 8760 hours. HbA1C: No results for input(s): "HGBA1C" in the last 72 hours. CBG: No results for input(s): "GLUCAP" in the last 168 hours. Lipid Profile: No results for input(s): "CHOL", "HDL", "LDLCALC", "TRIG", "CHOLHDL", "LDLDIRECT" in the last 72 hours. Thyroid Function Tests: No results for input(s): "TSH", "T4TOTAL", "FREET4", "T3FREE", "THYROIDAB" in the last 72 hours. Anemia Panel: No results for input(s): "VITAMINB12", "FOLATE", "FERRITIN", "TIBC", "IRON", "RETICCTPCT" in the last 72 hours. Urine analysis:    Component Value Date/Time   COLORURINE AMBER (A) 01/25/2023 2052   APPEARANCEUR CLOUDY (A) 01/25/2023 2052   APPEARANCEUR Clear 05/20/2021 1414   LABSPEC 1.019 01/25/2023 2052   PHURINE 5.0 01/25/2023 2052   GLUCOSEU NEGATIVE 01/25/2023 2052   HGBUR NEGATIVE 01/25/2023 2052   BILIRUBINUR NEGATIVE 01/25/2023 2052   BILIRUBINUR Negative 05/20/2021 1414   KETONESUR NEGATIVE 01/25/2023 2052   PROTEINUR 100 (A) 01/25/2023 2052   NITRITE NEGATIVE 01/25/2023 2052   LEUKOCYTESUR MODERATE (A) 01/25/2023 2052    Radiological Exams on Admission: DG Chest 2 View  Result Date: 01/25/2023 CLINICAL DATA:  Weakness and cough EXAM: CHEST - 2 VIEW COMPARISON:  Chest radiograph 06/08/2021 FINDINGS: Stable cardiomediastinal silhouette. Aortic atherosclerotic  calcification. Hyperinflation and chronic bronchitic changes. Bibasilar scarring. No focal consolidation, effusion, or pneumothorax. No displaced rib fractures. Left reverse TSA. IMPRESSION: No active cardiopulmonary disease.  Emphysema. Electronically Signed   By: Minerva Fester M.D.   On: 01/25/2023 20:44     Data Reviewed: Relevant notes from primary care and specialist visits, past discharge summaries as available in EHR, including Care Everywhere. Prior diagnostic testing as pertinent to current admission diagnoses Updated medications and problem lists for reconciliation ED course, including vitals, labs, imaging, treatment and response to treatment Triage notes, nursing and pharmacy notes and ED provider's notes Notable results as noted in HPI   Assessment and Plan: * Cellulitis of right ring finger secondary to infected ulcer History of fracture right fifth finger 01/18/2023 s/p cast removal on 416/24 Finger ulcer likely secondary to friction from cast, resulting in infection/cellulitis Continue Unasyn and vancomycin Follow-up wound and blood cultures Orthopedics consult to follow.  Was consulted from the ED  Hyperkalemia Potassium 5.8 Received Lokelma in the ED Continue to monitor  Acute kidney injury superimposed on CKD IIIb (HCC) Creatinine 2.48, up from baseline of 1.57, presumed prerenal IV fluid bolus and monitor   COPD (chronic obstructive pulmonary disease) Chronic respiratory failure Not acutely exacerbated Continue home inhalers with DuoNebs as needed  CAD (coronary artery disease) No complaints of chest pain and EKG nonacute Continue aspirin, simvastatin and lisinopril  Essential hypertension Patient with somewhat soft blood pressure will hold verapamil, lisinopril for now     DVT prophylaxis: Lovenox  Consults: Orthopedist, Dr. Martha Clan  Advance Care Planning:   Code Status: Prior   Family Communication: none  Disposition Plan: Back to previous  home environment  Severity of Illness: The appropriate patient status for this patient is INPATIENT. Inpatient status is judged to be reasonable and necessary in order to provide the required intensity of service to ensure the patient's safety. The patient's presenting symptoms, physical exam findings, and initial radiographic and laboratory data in the context of their chronic comorbidities is felt to place them at high risk for further clinical deterioration. Furthermore, it is not anticipated that the patient will be medically stable for discharge from the hospital within 2 midnights of  admission.   * I certify that at the point of admission it is my clinical judgment that the patient will require inpatient hospital care spanning beyond 2 midnights from the point of admission due to high intensity of service, high risk for further deterioration and high frequency of surveillance required.*  Author: Andris Baumann, MD 01/25/2023 10:51 PM  For on call review [email or web address].

## 2023-01-25 NOTE — Assessment & Plan Note (Addendum)
Resolved with Lokelma. Potassium 5.8 on admission K today 4.8 --Monitor BMP

## 2023-01-25 NOTE — ED Provider Triage Note (Signed)
Emergency Medicine Provider Triage Evaluation Note  George [name] , a 89 y.o. male  was evaluated in triage.  Pt complains of decreased sensation to right ring finger. He has a casted boxer's fracture and cast has cut into the finger.  Physical Exam  There were no vitals taken for this visit. Gen:   Awake, no distress   Resp:  Normal effort  MSK:   Moves extremities without difficulty  Other:  Erythema to tip of right ring finger with wound in the PIP on volar aspect.  Medical Decision Making  Medically screening exam initiated at 5:34 PM.  Appropriate orders placed.  George [name] was informed that the remainder of the evaluation will be completed by another provider, this initial triage assessment does not replace that evaluation, and the importance of remaining in the ED until their evaluation is complete.    George Pester, FNP 01/25/23 2313

## 2023-01-26 ENCOUNTER — Encounter: Payer: Self-pay | Admitting: Internal Medicine

## 2023-01-26 DIAGNOSIS — L03011 Cellulitis of right finger: Secondary | ICD-10-CM | POA: Diagnosis not present

## 2023-01-26 LAB — CBC
HCT: 31.1 % — ABNORMAL LOW (ref 39.0–52.0)
Hemoglobin: 10.4 g/dL — ABNORMAL LOW (ref 13.0–17.0)
MCH: 30.9 pg (ref 26.0–34.0)
MCHC: 33.4 g/dL (ref 30.0–36.0)
MCV: 92.3 fL (ref 80.0–100.0)
Platelets: 285 10*3/uL (ref 150–400)
RBC: 3.37 MIL/uL — ABNORMAL LOW (ref 4.22–5.81)
RDW: 13.8 % (ref 11.5–15.5)
WBC: 13.2 10*3/uL — ABNORMAL HIGH (ref 4.0–10.5)
nRBC: 0 % (ref 0.0–0.2)

## 2023-01-26 LAB — BASIC METABOLIC PANEL
Anion gap: 7 (ref 5–15)
BUN: 60 mg/dL — ABNORMAL HIGH (ref 8–23)
CO2: 22 mmol/L (ref 22–32)
Calcium: 8.6 mg/dL — ABNORMAL LOW (ref 8.9–10.3)
Chloride: 108 mmol/L (ref 98–111)
Creatinine, Ser: 2.12 mg/dL — ABNORMAL HIGH (ref 0.61–1.24)
GFR, Estimated: 29 mL/min — ABNORMAL LOW (ref >60)
Glucose, Bld: 98 mg/dL (ref 70–99)
Potassium: 4.7 mmol/L (ref 3.5–5.1)
Sodium: 137 mmol/L (ref 135–145)

## 2023-01-26 MED ORDER — VANCOMYCIN VARIABLE DOSE PER UNSTABLE RENAL FUNCTION (PHARMACIST DOSING): Status: DC

## 2023-01-26 MED ORDER — VANCOMYCIN HCL 500 MG/100ML IV SOLN
500.0000 mg | Freq: Once | INTRAVENOUS | Status: DC
  Administered 2023-01-26: 500 mg via INTRAVENOUS
  Filled 2023-01-26: qty 100

## 2023-01-26 MED ORDER — VANCOMYCIN HCL 750 MG/150ML IV SOLN: 750.0000 mg | INTRAVENOUS | Status: DC

## 2023-01-26 NOTE — Progress Notes (Signed)
Progress Note   Patient: George [name] XBJ:[id number] DOB: 09/13/1932 DOA: 01/25/2023     1 DOS: the patient was seen and examined on 01/26/2023   Brief hospital course: HPI on Admission, late evening 4/16:  "George [name] is a 89 y.o. male with medical history significant for CKD-IIIb, hypertension, COPD, OSA, CAD, stent placement, colon cancer, BPH, who presented to the ED with an ulcer of the right fourth finger associated with pain redness and swelling of the finger.  Patient sustained fracture of the right fifth finger on 4/9 and had been placed in a cast at [facility].  Due to cast was rubbing against his fourth finger, he developed an ulcer and then over the past few days he started having redness with increased pain and swelling of the finger.  ..."  ED course - afebrile, stable vitals with BP mildly soft at 111/57. Labs notable for leukocytosis (WBC 17k), stable mild anemia Hbg 11.3 (baseline), Cr 2.48 (baseline 1.57) and potassium 5.8.  Mildly elevated LFTs with AST 46 and ALT 271.  X-rays of the right hand and wrist showed the known comminuted R fifth proximal phalanx fracture and mildly displace intra-articular radial styloid fracture, with soft tissue swelling of the 4th and 5th fingers.    Orthopedic surgery was consulted by EDP, recommended starting empiric IV antibiotics.    Pt was admitted, started on IV Unasyn and Vancomycin.  Seen by Orthopedics on 4/17.  Non-operative management recommended.  Right 5th finger splinted, to be maintained in split at all times.  Dressing changes to right 4th digit as needed.   Assessment and Plan: * Cellulitis of right ring finger secondary to infected ulcer History of fracture right fifth finger 01/18/2023 s/p cast removal on 416/24 Finger ulcer likely secondary to pressure and/or friction from cast, resulting in cellulitis --Continue Unasyn and vancomycin --Follow-up wound and blood cultures --Orthopedic sugery consulted - see their  recommendations.  Non-operative management with R 5th finger splinted, R 4th finger dressing changes PRN. --Pain control PRN  History of fracture of right fifth finger on 01/18/23 Mgmt per Orthopedic Surgery  Hyperkalemia Resolved with Lokelma. Potassium 5.8 on admission K today 4.7 --Monitor BMP  Acute kidney injury superimposed on CKD IIIb (HCC) Creatinine 2.48 on admission, up from baseline of 1.57, presumed prerenal azotemia. 4/17: Cr improved to 2.12 with fluids. --Monitor off IV fluids --Encourage PO hydration --Monitor BMP   Chronic respiratory failure with hypoxia .  COPD (chronic obstructive pulmonary disease) Chronic respiratory failure Not acutely exacerbated Continue home inhalers with DuoNebs as needed Supplement O2, target sats 88-94%  CAD (coronary artery disease) Stable, no chest pain or acute EKG changes. --Continue aspirin, simvastatin and lisinopril  Essential hypertension Soft BP's on admission --Holding verapamil, lisinopril for now --Resume meds as BP tolerates        Subjective: Pt seen in the ED holding for a bed this AM.  His hearing aid batteries had died, asking to find a way to charge them.  Says he cannot hear anything.  No family or supports at bedside during my encounter.  He denies pain.  Physical Exam: Vitals:   01/26/23 0549 01/26/23 0930 01/26/23 1040 01/26/23 1345  BP: 94/66 113/81  100/75  Pulse: 88 88  92  Resp: Temp: 98.1 F (36.7 C)  97.9 F (36.6 C) (!) 97.5 F (36.4 C)  TempSrc: Oral  Oral Oral  SpO2: 95% 96%  97%  Weight:  Height:       General exam: awake, alert, no acute distress HEENT: moist mucus membranes, very hard of hearing  Respiratory system: CTAB, no wheezes, rales or rhonchi, normal respiratory effort. Cardiovascular system: normal S1/S2, RRR, no JVD, murmurs, rubs, gallops, no pedal edema.   Gastrointestinal system: soft, NT, ND, no HSM felt, +bowel sounds. Central nervous system: no  gross focal neurologic deficits, normal speech Extremities: right 4th finger with clean dry intact gauze dressing, splint and bandage to right 5th finger >> forearm Skin: dry, intact, normal temperature Psychiatry: normal mood, congruent affect   Data Reviewed:  Notable labs --- Cr improved 2.12 from 2.48 K 4.7 from 5.8 BUN 60 from 74 WBC improved 17 >> 13.2k, Hbg 10.4 from 11.3  UA moderate leukocytes, proteinuria, rare bacteria, 21-50 WBC's but contaminated with 11-20 epi cells, with mucus hyaline casts and amorphous crystals present  Family Communication: None present at bedside, will attempt to call as time allows  Disposition: Status is: Inpatient Remains inpatient appropriate because: Remains on IV antibiotics pending further clinical improvement   Planned Discharge Destination: Home with Home Health    Time spent: 42 minutes  Author: Pennie Banter, DO 01/26/2023 6:40 PM  For on call review [email or web address].

## 2023-01-26 NOTE — Assessment & Plan Note (Signed)
Mgmt per Orthopedic Surgery

## 2023-01-26 NOTE — Progress Notes (Addendum)
Pharmacy Antibiotic Note  George [name] is a 89 y.o. male admitted on 01/25/2023 with cellulitis.  Pharmacy has been consulted for Unasyn & Vancomycin dosing x 7 days.  Plan: Unasyn 3 gm q12hr per indication & renal fxn.  Pt given Vancomycin 1000 mg x 1 on 4/16 at 2228. Vancomycin 500 mg once give 4/17 at 1027 in ED.  Will follow renal function closely and dose based on next random level due to AKI. Scr 1.4 on 11/16/2022 at [facility], Scr 2.48 on admission down to 2.12 this morning. TBW 58.8 kg < IBW 73 kg Vancomycin random level tomorrow 4/18 with AM labs  Pharmacy will continue to follow and will adjust abx dosing whenever warranted.  Temp (24hrs), Avg:98.3 F (36.8 C), Min:98.1 F (36.7 C), Max:98.5 F (36.9 C)   Recent Labs  Lab 01/25/23 1746 01/26/23 0549  WBC 17.0* 13.2*  CREATININE 2.48* 2.12*     Estimated Creatinine Clearance: 19.6 mL/min (A) (by C-G formula based on SCr of 2.12 mg/dL (H)).    No Known Allergies  Antimicrobials this admission: 4/16 Unasyn >> x 7 days 4/16 Vancomycin >> x 7 days  Microbiology results: 04/16 BCx: NG < 12 hrs 04/16 Aerobic Cx: few gram positive cocci  Thank you for allowing pharmacy to be a part of this patient's care.  Eirik Schueler Rodriguez-Guzman PharmD, BCPS 01/26/2023 10:29 AM

## 2023-01-26 NOTE — Consult Note (Signed)
ORTHOPAEDIC CONSULTATION  REQUESTING PHYSICIAN: Pennie Banter, DO  Chief Complaint: Right hand  HPI: George [name] is a 89 y.o. male who was admitted overnight when he presented with an infection to his right ring finger.  Patient recently sustained a fracture to his right little finger proximal phalanx.  He was seen at an outside facility and placed in an ulnar gutter splint.  This caused skin breakdown to the volar aspect of his distal right ring finger.  Patient developed pain and swelling in the right ring finger and presented to the ER.  Patient was given IV antibiotics.  Orthopedics is consulted for evaluation of the right ring finger and small finger.   Past Medical History:  Diagnosis Date   Anemia    Arthritis    BPH (benign prostatic hyperplasia)    Cancer    COLON   COPD (chronic obstructive pulmonary disease)    Coronary artery disease    WITH 1 STENT   Dyspnea    WITH EXERTION ONLY   GERD (gastroesophageal reflux disease)    HOH (hard of hearing)    Hypercholesteremia    Hypertension    Pneumonia 06/2021   Sleep apnea    HAD UPPP   Past Surgical History:  Procedure Laterality Date   CARDIAC CATHETERIZATION     has a stent   COLON SURGERY     DUE TO COLON CANCER   COLONOSCOPY     MULTIPLE   EYE SURGERY Bilateral    CATARACT   HERNIA REPAIR     HOLEP-LASER ENUCLEATION OF THE PROSTATE WITH MORCELLATION N/A 08/21/2021   Procedure: HOLEP-LASER ENUCLEATION OF THE PROSTATE WITH MORCELLATION;  Surgeon: Sondra Come, MD;  Location: [facility] OR[facility];  Service: Urology;  Laterality: N/A;   REVERSE SHOULDER ARTHROPLASTY Left 02/10/2021   Procedure: REVERSE SHOULDER ARTHROPLASTY;  Surgeon: Christena Flake, MD;  Location: [facility] OR[facility];  Service: Orthopedics;  Laterality: Left;   TONSILLECTOMY     UVULOPALATOPHARYNGOPLASTY     Social History   Socioeconomic History   Marital status: Widowed    Spouse name: Not on file   Number of children: Not on file   Years of  education: Not on file   Highest education level: Not on file  Occupational History   Not on file  Tobacco Use   Smoking status: Every Day    Packs/day: 1.50    Years: 60.00    Additional pack years: 0.00    Total pack years: 90.00    Types: Cigarettes   Smokeless tobacco: Never  Vaping Use   Vaping Use: Never used  Substance and Sexual Activity   Alcohol use: Not Currently    Comment: occ WINE   Drug use: No   Sexual activity: Not Currently  Other Topics Concern   Not on file  Social History Narrative   Patient reports being retired since 2008. Per patient he lives alone, but has friends that live near by that help him out.    Social Determinants of Health   Financial Resource Strain: Not on file  Food Insecurity: No Food Insecurity (01/26/2023)   Hunger Vital Sign    Worried About Running Out of Food in the Last Year: Never true    Ran Out of Food in the Last Year: Never true  Transportation Needs: No Transportation Needs (01/26/2023)   PRAPARE - Administrator, Civil Service (Medical): No    Lack of Transportation (Non-Medical): No  Physical Activity: Not  on file  Stress: Not on file  Social Connections: Not on file   Family History  Problem Relation Age of Onset   Prostate cancer Neg Hx    Bladder Cancer Neg Hx    Kidney cancer Neg Hx    No Known Allergies Prior to Admission medications   Medication Sig Start Date End Date Taking? Authorizing Provider  albuterol (PROVENTIL) (2.5 MG/3ML) 0.083% nebulizer solution Take 2.5 mg by nebulization every 6 (six) hours as needed for wheezing. 01/20/23 01/20/24 Yes [provider]  albuterol (VENTOLIN HFA) 108 (90 Base) MCG/ACT inhaler Inhale 1-2 puffs into the lungs every 6 (six) hours as needed for wheezing or shortness of breath.   Yes [provider]  aspirin EC 81 MG tablet Take 81 mg by mouth daily. Swallow whole.   Yes [provider]  colchicine 0.6 MG tablet Take 0.5 tablets (0.3  mg total) by mouth daily. 06/17/21  Yes Darlin Priestly, MD  cyanocobalamin (,VITAMIN B-12,) 1000 MCG/ML injection Inject 1,000 mcg into the muscle every 30 (thirty) days.   Yes [provider]  Fluticasone-Umeclidin-Vilant (TRELEGY ELLIPTA) 100-62.5-25 MCG/INH AEPB Inhale 1 puff into the lungs every morning.   Yes [provider]  hyoscyamine (LEVSIN) 0.125 MG tablet Take 0.125 mg by mouth every 4 (four) hours as needed for cramping.   Yes [provider]  omeprazole (PRILOSEC) 20 MG capsule Take 20 mg by mouth every morning.   Yes [provider]  simvastatin (ZOCOR) 20 MG tablet Take 10 mg by mouth at bedtime.   Yes [provider]  traMADol (ULTRAM) 50 MG tablet Take 50 mg by mouth every 8 (eight) hours as needed for moderate pain. 01/18/23  Yes [provider]  verapamil (CALAN-SR) 240 MG CR tablet Take 240 mg by mouth 2 (two) times daily.   Yes [provider]  lisinopril (PRINIVIL,ZESTRIL) 20 MG tablet Take 20 mg by mouth 2 (two) times daily. Patient not taking: Reported on 01/26/2023    [provider]  testosterone cypionate (DEPOTESTOSTERONE CYPIONATE) 200 MG/ML injection Inject 200 mg into the muscle every 14 (fourteen) days. Patient not taking: Reported on 01/26/2023    [provider]   DG Wrist Complete Right  Result Date: 01/25/2023 CLINICAL DATA:  Right hand ring finger redness and open wound. Cast on from fracture pinky placed 01/18/2023 EXAM: RIGHT WRIST - COMPLETE 3+ VIEW; RIGHT HAND - COMPLETE 3+ VIEW COMPARISON:  Radiographs 01/11/2023 FINDINGS: Redemonstrated comminuted fracture of the fifth finger proximal phalanx. Redemonstrated mildly displaced intra-articular radial styloid fracture. No additional fractures. Soft tissue swelling about the fourth and fifth fingers. IMPRESSION: 1. Redemonstrated comminuted fracture of the fifth finger proximal phalanx. 2. Redemonstrated mildly displaced intra-articular radial  styloid fracture. 3. Soft tissue swelling about the fourth and fifth fingers. Electronically Signed   By: Minerva Fester M.D.   On: 01/25/2023 23:07   DG Hand Complete Right  Result Date: 01/25/2023 CLINICAL DATA:  Right hand ring finger redness and open wound. Cast on from fracture pinky placed 01/18/2023 EXAM: RIGHT WRIST - COMPLETE 3+ VIEW; RIGHT HAND - COMPLETE 3+ VIEW COMPARISON:  Radiographs 01/11/2023 FINDINGS: Redemonstrated comminuted fracture of the fifth finger proximal phalanx. Redemonstrated mildly displaced intra-articular radial styloid fracture. No additional fractures. Soft tissue swelling about the fourth and fifth fingers. IMPRESSION: 1. Redemonstrated comminuted fracture of the fifth finger proximal phalanx. 2. Redemonstrated mildly displaced intra-articular radial styloid fracture. 3. Soft tissue swelling about the fourth and fifth fingers. Electronically Signed  By: Minerva Fester M.D.   On: 01/25/2023 23:07   DG Chest 2 View  Result Date: 01/25/2023 CLINICAL DATA:  Weakness and cough EXAM: CHEST - 2 VIEW COMPARISON:  Chest radiograph 06/08/2021 FINDINGS: Stable cardiomediastinal silhouette. Aortic atherosclerotic calcification. Hyperinflation and chronic bronchitic changes. Bibasilar scarring. No focal consolidation, effusion, or pneumothorax. No displaced rib fractures. Left reverse TSA. IMPRESSION: No active cardiopulmonary disease.  Emphysema. Electronically Signed   By: Minerva Fester M.D.   On: 01/25/2023 20:44    Positive ROS: All other systems have been reviewed and were otherwise negative with the exception of those mentioned in the HPI and as above.  Physical Exam: General: Alert, no acute distress.  Patient's hearing aids are out of battery and he cannot hear.  MUSCULOSKELETAL: Right hand: Patient's skin is intact overlying the right small finger.  He has mild swelling over the proximal phalanx and mild tenderness.  Examination of the right ring finger shows skin  breakdown over the volar skin near the DIP joint.  Patient has erythema and swelling throughout the right ring finger but mostly noted over the middle and distal phalanx.  Examination was difficult today as the patient could not hear.  His fingers are well-perfused.  It was difficult to assess his motor and sensory function given his hearing difficulty.  Patient had mild to moderate diffuse tenderness.  There is no erythema or swelling extending into his hand.  Assessment: Right proximal phalanx fracture, small finger Right ring finger cellulitis secondary to wound from splint  Plan: Given the patient's age, his skin is a breakdown with pressure.  I put a new bandage on his ring finger including Xeroform 2 x 2's and conform.  Secondarily I splinted the right small finger with a dorsal splint in a resting position to stabilize the proximal phalanx fracture.  Patient is being admitted for IV antibiotics for treatment of cellulitis of the right ring finger.  I will continue to follow the patient's progress and response to IV antibiotics.  There is no obvious abscess at this point which require surgical drainage.  There is no plan for surgery at this time.  Patient be treated nonoperatively for his proximal phalanx fracture of the right small finger.  Patient will continue splinting the small finger at all times.  He may have dressing changes of the right ring finger as needed.  I wrote this plan out for the patient on a piece of paper and he understands and agrees with the plan.  Patient's home health aide who takes care of the patient at home is at the bedside.  I explained the plan to her as well and she was in agreement.  I have sent a text message to Dr. Denton Lank with this plan as well.   [name], MD    01/26/2023 1:34 PM

## 2023-01-26 NOTE — Hospital Course (Signed)
HPI on Admission, late evening 4/16:  "George [name] is a 89 y.o. male with medical history significant for CKD-IIIb, hypertension, COPD, OSA, CAD, stent placement, colon cancer, BPH, who presented to the ED with an ulcer of the right fourth finger associated with pain redness and swelling of the finger.  Patient sustained fracture of the right fifth finger on 4/9 and had been placed in a cast at [facility].  Due to cast was rubbing against his fourth finger, he developed an ulcer and then over the past few days he started having redness with increased pain and swelling of the finger.  ..."  ED course - afebrile, stable vitals with BP mildly soft at 111/57. Labs notable for leukocytosis (WBC 17k), stable mild anemia Hbg 11.3 (baseline), Cr 2.48 (baseline 1.57) and potassium 5.8.  Mildly elevated LFTs with AST 46 and ALT 271.  X-rays of the right hand and wrist showed the known comminuted R fifth proximal phalanx fracture and mildly displace intra-articular radial styloid fracture, with soft tissue swelling of the 4th and 5th fingers.    Orthopedic surgery was consulted by EDP, recommended starting empiric IV antibiotics.    Pt was admitted, started on IV Unasyn and Vancomycin.  Seen by Orthopedics on 4/17.  Non-operative management recommended.  Right 5th finger splinted, to be maintained in split at all times.  Dressing changes to right 4th digit as needed.

## 2023-01-27 DIAGNOSIS — L03011 Cellulitis of right finger: Secondary | ICD-10-CM | POA: Diagnosis not present

## 2023-01-27 LAB — CBC
HCT: 33.7 % — ABNORMAL LOW (ref 39.0–52.0)
Hemoglobin: 11.4 g/dL — ABNORMAL LOW (ref 13.0–17.0)
MCH: 30.6 pg (ref 26.0–34.0)
MCHC: 33.8 g/dL (ref 30.0–36.0)
MCV: 90.3 fL (ref 80.0–100.0)
Platelets: 307 10*3/uL (ref 150–400)
RBC: 3.73 MIL/uL — ABNORMAL LOW (ref 4.22–5.81)
RDW: 13.6 % (ref 11.5–15.5)
WBC: 9 10*3/uL (ref 4.0–10.5)
nRBC: 0 % (ref 0.0–0.2)

## 2023-01-27 LAB — COMPREHENSIVE METABOLIC PANEL
ALT: 162 U/L — ABNORMAL HIGH (ref 0–44)
AST: 35 U/L (ref 15–41)
Albumin: 2.8 g/dL — ABNORMAL LOW (ref 3.5–5.0)
Alkaline Phosphatase: 80 U/L (ref 38–126)
Anion gap: 9 (ref 5–15)
BUN: 52 mg/dL — ABNORMAL HIGH (ref 8–23)
CO2: 21 mmol/L — ABNORMAL LOW (ref 22–32)
Calcium: 8.8 mg/dL — ABNORMAL LOW (ref 8.9–10.3)
Chloride: 109 mmol/L (ref 98–111)
Creatinine, Ser: 1.97 mg/dL — ABNORMAL HIGH (ref 0.61–1.24)
GFR, Estimated: 32 mL/min — ABNORMAL LOW (ref >60)
Glucose, Bld: 84 mg/dL (ref 70–99)
Potassium: 4.8 mmol/L (ref 3.5–5.1)
Sodium: 139 mmol/L (ref 135–145)
Total Bilirubin: 0.7 mg/dL (ref 0.3–1.2)
Total Protein: 5.8 g/dL — ABNORMAL LOW (ref 6.5–8.1)

## 2023-01-27 LAB — VANCOMYCIN, RANDOM: Vancomycin Rm: 8 ug/mL

## 2023-01-27 LAB — MAGNESIUM: Magnesium: 1.9 mg/dL (ref 1.7–2.4)

## 2023-01-27 MED ORDER — VANCOMYCIN HCL 750 MG/150ML IV SOLN
750.0000 mg | Freq: Once | INTRAVENOUS | Status: AC
  Administered 2023-01-27: 750 mg via INTRAVENOUS
  Filled 2023-01-27: qty 150

## 2023-01-27 MED ORDER — SODIUM CHLORIDE 0.9 % IV SOLN: INTRAVENOUS | Status: AC

## 2023-01-27 NOTE — Progress Notes (Signed)
Progress Note   Patient: George [name] ZOX:[id number] DOB: 23-Jul-1932 DOA: 01/25/2023     2 DOS: the patient was seen and examined on 01/27/2023   Brief hospital course: HPI on Admission, late evening 4/16:  "George [name] is a 89 y.o. male with medical history significant for CKD-IIIb, hypertension, COPD, OSA, CAD, stent placement, colon cancer, BPH, who presented to the ED with an ulcer of the right fourth finger associated with pain redness and swelling of the finger.  Patient sustained fracture of the right fifth finger on 4/9 and had been placed in a cast at [facility].  Due to cast was rubbing against his fourth finger, he developed an ulcer and then over the past few days he started having redness with increased pain and swelling of the finger.  ..."  ED course - afebrile, stable vitals with BP mildly soft at 111/57. Labs notable for leukocytosis (WBC 17k), stable mild anemia Hbg 11.3 (baseline), Cr 2.48 (baseline 1.57) and potassium 5.8.  Mildly elevated LFTs with AST 46 and ALT 271.  X-rays of the right hand and wrist showed the known comminuted R fifth proximal phalanx fracture and mildly displace intra-articular radial styloid fracture, with soft tissue swelling of the 4th and 5th fingers.    Orthopedic surgery was consulted by EDP, recommended starting empiric IV antibiotics.    Pt was admitted, started on IV Unasyn and Vancomycin.  Seen by Orthopedics on 4/17.  Non-operative management recommended.  Right 5th finger splinted, to be maintained in split at all times.  Dressing changes to right 4th digit as needed.   Assessment and Plan: * Cellulitis of right ring finger secondary to infected ulcer History of fracture right fifth finger 01/18/2023 s/p cast removal on 416/24 Finger ulcer likely secondary to pressure and/or friction from cast, resulting in cellulitis --Continue Unasyn and vancomycin --Follow-up wound and blood cultures --Orthopedic sugery consulted - see their  recommendations.  Non-operative management with R 5th finger splinted, R 4th finger dressing changes PRN. --Pain control PRN  History of fracture of right fifth finger on 01/18/23 Mgmt per Orthopedic Surgery  Hyperkalemia Resolved with Lokelma. Potassium 5.8 on admission K today 4.7 --Monitor BMP  Acute kidney injury superimposed on CKD IIIb (HCC) Creatinine 2.48 on admission, up from baseline of 1.57, presumed prerenal azotemia. 4/17: Cr improved to 2.12 with fluids. --Monitor off IV fluids --Encourage PO hydration --Monitor BMP   Chronic respiratory failure with hypoxia .  COPD (chronic obstructive pulmonary disease) Chronic respiratory failure with hypoxia Not acutely exacerbated Home oxygen evaluation Continue home inhalers with DuoNebs as needed Supplement O2, target sats 88-94%  CAD (coronary artery disease) Stable, no chest pain or acute EKG changes. --Continue aspirin, simvastatin and lisinopril  Essential hypertension Soft BP's on admission --Holding verapamil, lisinopril for now --Resume meds as BP tolerates        Subjective: Pt awake sitting up in bed this AM.  He is very hard of hearing, I used "Live Transcribe" app to communicate with him.  He says he does not know where he is or where he lives.  Denies pain in his hand or finger.  Does not feel sick.  Asked twice if he is in the hospital.     Physical Exam: Vitals:   01/26/23 1345 01/26/23 1939 01/27/23 0400 01/27/23 0843  BP: 100/75 (!) 157/84 122/70 126/74  Pulse: 92 (!) 110 82 84  Resp: Temp: (!) 97.5 F (36.4 C) 97.9 F (36.6 C)  97.7 F (36.5 C) 98 F (36.7 C)  TempSrc: Oral Oral  Oral  SpO2: 97% 98% 97% 99%  Weight:      Height:       General exam: awake, alert, no acute distress, confused HEENT: moist mucus membranes, very hard of hearing  Respiratory system: CTAB, no wheezes, rales or rhonchi, normal respiratory effort. Cardiovascular system: normal S1/S2, RRR, no  pedal edema.   Gastrointestinal system: soft, NT, ND, no HSM felt, +bowel sounds. Central nervous system: alert oriented to self, no gross focal neurologic deficits, normal speech Extremities: right 4th finger with clean dry intact gauze dressing, splint and bandage to right 5th finger >> forearm Skin: dry, intact, normal temperature Psychiatry: normal mood, congruent affect   Data Reviewed:  Notable labs --- Cr improved 2.12 >> 1.97 K 4.8 BUN 52 WBC improved 17 >> 13.2 >> 9.0k, Hbg 11.4    Family Communication: None present at bedside, will attempt to call as time allows  Disposition: Status is: Inpatient Remains inpatient appropriate because: Remains on IV antibiotics pending further clinical improvement   Planned Discharge Destination: Home with Home Health    Time spent: 42 minutes  Author: Pennie Banter, DO 01/27/2023 1:54 PM  For on call review [email or web address].

## 2023-01-27 NOTE — Evaluation (Signed)
Physical Therapy Evaluation Patient Details Name: George [name] MRN: [id number] DOB: 06-Dec-1931 Today's Date: 01/27/2023  History of Present Illness  Pt is an 89 y/o male with PHM including shoulder surgery, CAD, COPD, CKD, DM2, respiratory failure. Admitted with cellulitis 2/2 recent fx of R little finger with splint applied at OSH around 4/9 and now with a new infected wound because of that rubbing on his ring finger. Has been seen by orthopedics with splint and wrapping provided.  Clinical Impression  Patient resting in bed upon arrival to room; alert and oriented to self only.  Follows simple commands (limited by baseline hearing deficits), pleasant and cooperative.  Limited ability to recall/integrate new information/education due to baseline cognitive deficits.  Denies pain, but endorses generally "feeling bad".  R UE with dorsal splint and 4th digit dressing in place; otherwise, bilat UE/LE strength and ROM grossly symmetrical and WFL for basic transfers and mobility.  Currently requires min assist for bed mobility; min assist for sit/stand and static standing balance with and without RW.  Requires consistent cuing/assist to minimize use of R UE with position changes; uses posterior surface of bilat LEs against edge of bed to assist with lift off and standing balance. Limited tolerance for upright positioning with reports of dizziness, pallor and worsening weakness/unsteadiness with transition to standing (requiring return to sitting/supine for recovery).  Moderate SOB with minimal activity noted during session; sats 95% on RA.  RN/MD informed/aware; will continue to assess/progress gait ability in subsequent sessions as appropriate. Would benefit from skilled PT to address above deficits and promote optimal return to PLOF.; recommend post-acute follow up PT services upon discharge from acute hospitalization.      Recommendations for follow up therapy are one component of a multi-disciplinary  discharge planning process, led by the attending physician.  Recommendations may be updated based on patient status, additional functional criteria and insurance authorization.  Follow Up Recommendations       Assistance Recommended at Discharge    Patient can return home with the following  A lot of help with walking and/or transfers;A lot of help with bathing/dressing/bathroom;Assistance with cooking/housework;Direct supervision/assist for medications management;Direct supervision/assist for financial management    Equipment Recommendations Wheelchair (measurements PT);Wheelchair cushion (measurements PT)  Recommendations for Other Services       Functional Status Assessment Patient has had a recent decline in their functional status and demonstrates the ability to make significant improvements in function in a reasonable and predictable amount of time.     Precautions / Restrictions Precautions Precautions: Fall Splint/Cast: R 5th finger splint (cleared for RW use per ortho) Restrictions Weight Bearing Restrictions: No      Mobility  Bed Mobility Overal bed mobility: Needs Assistance Bed Mobility: Supine to Sit, Sit to Supine     Supine to sit: Min assist Sit to supine: Min assist        Transfers Overall transfer level: Needs assistance Equipment used: Rolling walker (2 wheels) Transfers: Sit to/from Stand Sit to Stand: Min assist           General transfer comment: consistent cuing/assist to minimize use of R UE with position changes; uses posterior surface of bilat LEs against edge of bed to assist with lift off and standing balance.  Limited tolerance for upright positioning with reports of dizziness, pallor and worsening weakness/unsteadiness with transition to standing (requiring return to sitting/supine for recovery)    Ambulation/Gait  General Gait Details: unsafe/unable due to dizziness, orthostasis  Stairs             Wheelchair Mobility    Modified Rankin (Stroke Patients Only)       Balance Overall balance assessment: Needs assistance Sitting-balance support: No upper extremity supported, Feet supported Sitting balance-Leahy Scale: Good     Standing balance support: Bilateral upper extremity supported Standing balance-Leahy Scale: Fair                               Pertinent Vitals/Pain Pain Assessment Pain Assessment: No/denies pain    Home Living Family/patient expects to be discharged to:: Private residence Living Arrangements: Alone (has caregiver 7am-9pm, 7 days/week)   Type of Home: House         Home Layout: Two level;Able to live on main level with bedroom/bathroom Home Equipment: Cane - single point;Rolling Walker (2 wheels);BSC/3in1      Prior Function Prior Level of Function : Needs assist             Mobility Comments: Ambulatory for household distances with RW and +1 assist at all times; denies fall history outside of recent 'stumble' (injuring R hand/finger) ADLs Comments: Caregiver assists with all ADLs as needed; responsible for all household chores/responsibilities     Hand Dominance   Dominant Hand: Right    Extremity/Trunk Assessment   Upper Extremity Assessment Upper Extremity Assessment: Overall WFL for tasks assessed (R dorsal splint in place)    Lower Extremity Assessment Lower Extremity Assessment: Overall WFL for tasks assessed (grossly 4-/5 throughout)       Communication   Communication: HOH  Cognition Arousal/Alertness: Awake/alert Behavior During Therapy: WFL for tasks assessed/performed Overall Cognitive Status: History of cognitive impairments - at baseline                                 General Comments: Alert and oriented to self only; follows simple commands, plesant and cooperative.  Globally unaware of current situation; poor/no recall of new information.  Grossly unable to recall/integrate any  WBing precautions to R UE        General Comments      Exercises Other Exercises Other Exercises: Sit/stand and standing balance edge of bed, min assist without assist device, for urination (Dep assist to position/manage urinal)   Assessment/Plan    PT Assessment Patient needs continued PT services  PT Problem List Decreased activity tolerance;Decreased balance;Decreased mobility;Decreased coordination;Decreased cognition;Decreased safety awareness;Decreased knowledge of use of DME;Decreased knowledge of precautions;Cardiopulmonary status limiting activity       PT Treatment Interventions DME instruction;Gait training;Stair training;Functional mobility training;Therapeutic activities;Patient/family education;Cognitive remediation;Balance training;Therapeutic exercise    PT Goals (Current goals can be found in the Care Plan section)  Acute Rehab PT Goals Patient Stated Goal: to get better PT Goal Formulation: With patient Time For Goal Achievement: 02/10/23 Potential to Achieve Goals: Fair    Frequency Min 4X/week     Co-evaluation               AM-PAC PT "6 Clicks" Mobility  Outcome Measure Help needed turning from your back to your side while in a flat bed without using bedrails?: A Little Help needed moving from lying on your back to sitting on the side of a flat bed without using bedrails?: A Little Help needed moving to and from a bed to a  chair (including a wheelchair)?: A Little Help needed standing up from a chair using your arms (e.g., wheelchair or bedside chair)?: A Little Help needed to walk in hospital room?: A Lot Help needed climbing 3-5 steps with a railing? : A Lot 6 Click Score: 16    End of Session Equipment Utilized During Treatment: Gait belt Activity Tolerance: Patient tolerated treatment well Patient left: in bed;with call bell/phone within reach;with bed alarm set;with family/visitor present Nurse Communication: Mobility status PT Visit  Diagnosis: Muscle weakness (generalized) (M62.81);Difficulty in walking, not elsewhere classified (R26.2)    Time: 4098-1191 PT Time Calculation (min) (ACUTE ONLY): 46 min   Charges:   PT Evaluation $PT Eval Moderate Complexity: 1 Mod PT Treatments $Therapeutic Activity: 8-22 mins       Kalisa Girtman H. Manson Passey, PT, DPT, NCS 01/27/23, 2:08 PM [phone]

## 2023-01-27 NOTE — TOC Initial Note (Signed)
Transition of Care [facility]) - Initial/Assessment Note    Patient Details  Name: George [name] MRN: [id number] Date of Birth: [birth year]  Transition of Care [facility]) CM/SW Contact:    Chapman Fitch, RN Phone Number: 01/27/2023, 4:37 PM  Clinical Narrative:                 TOC assessment complete.  Full note to follow         Patient Goals and CMS Choice            Expected Discharge Plan and Services                                              Prior Living Arrangements/Services                       Activities of Daily Living Home Assistive Devices/Equipment: Brace (specify type), Walker (specify type), Cane (specify quad or straight), Eyeglasses ADL Screening (condition at time of admission) Patient's cognitive ability adequate to safely complete daily activities?: Yes Is the patient deaf or have difficulty hearing?: No Does the patient have difficulty seeing, even when wearing glasses/contacts?: No Does the patient have difficulty concentrating, remembering, or making decisions?: Yes Patient able to express need for assistance with ADLs?: Yes Does the patient have difficulty dressing or bathing?: Yes Independently performs ADLs?: No Communication: Independent Dressing (OT): Needs assistance Is this a change from baseline?: Pre-admission baseline Grooming: Needs assistance Is this a change from baseline?: Pre-admission baseline Feeding: Independent Bathing: Needs assistance Is this a change from baseline?: Pre-admission baseline Toileting: Needs assistance Is this a change from baseline?: Pre-admission baseline In/Out Bed: Needs assistance Is this a change from baseline?: Pre-admission baseline Walks in Home: Needs assistance Is this a change from baseline?: Pre-admission baseline Does the patient have difficulty walking or climbing stairs?: Yes Weakness of Legs: Both Weakness of Arms/Hands: Right  Permission Sought/Granted                   Emotional Assessment              Admission diagnosis:  Cellulitis, finger [L03.019] Cellulitis of finger of right hand [L03.011] Generalized weakness [R53.1] AKI (acute kidney injury) [N17.9] Cellulitis of right finger [L03.011] Patient Active Problem List   Diagnosis Date Noted   Cellulitis of right ring finger secondary to infected ulcer 01/25/2023   Chronic respiratory failure with hypoxia 01/25/2023   History of fracture of right fifth finger on 01/18/23 01/25/2023   OSA (obstructive sleep apnea) 01/25/2023   Acute on chronic respiratory failure with hypoxia 06/08/2021   Gout 06/08/2021   Sepsis 06/08/2021   COPD exacerbation 06/08/2021   CAP (community acquired pneumonia) 06/08/2021   Status post reverse arthroplasty of shoulder, left 02/10/2021   Acute kidney injury superimposed on CKD IIIb (HCC) 11/04/2020   BPH with obstruction/lower urinary tract symptoms 11/04/2020   Hyperkalemia 11/04/2020   Acute urinary retention 11/04/2020   Pneumonia 06/25/2019   Foot pain 07/11/2018   Urinary urgency 06/27/2017   Erectile dysfunction 06/27/2017   Stage 3 acute kidney injury 06/27/2017   Hypogonadism in male 06/27/2017   Vomiting 02/09/2016   Slurred speech 02/09/2016   Nausea and vomiting 02/09/2016   Lower abdominal pain 02/09/2016   Tobacco abuse 02/09/2016   Essential hypertension 02/09/2016   GERD (gastroesophageal reflux disease)  02/09/2016   Hyperlipidemia 04/22/2014   Hypertension 04/22/2014   COPD (chronic obstructive pulmonary disease) 04/22/2014   Type II diabetes mellitus with renal manifestations 04/22/2014   CAD (coronary artery disease) 09/10/1998   PCP:  [name], MD Pharmacy:   [facility] - [place], [place] - [address] [place] [address] Phone: [phone] Fax: [phone]     Social Determinants of Health (SDOH) Social History: SDOH Screenings   Food Insecurity: No Food Insecurity  (01/26/2023)  Housing: Low Risk  (01/26/2023)  Transportation Needs: No Transportation Needs (01/26/2023)  Utilities: Not At Risk (01/26/2023)  Tobacco Use: High Risk (01/26/2023)   SDOH Interventions:     Readmission Risk Interventions     No data to display

## 2023-01-27 NOTE — Progress Notes (Signed)
Pharmacy Antibiotic Note  George [name] is a 89 y.o. male w/ PMH of CKD-IIIb, hypertension, COPD, OSA, CAD, stent placement, colon cancer, BPH admitted on 01/25/2023 with cellulitis.  Pharmacy has been consulted for Unasyn & Vancomycin dosing    Plan:  1) continue Unasyn 3 grams IV every 12 hours  --CTM renal function  2) vancomycin Pt given Vancomycin 1000 mg x 1 on 4/16 at 2228. Vancomycin 500 mg once give 4/17 at 1027 in ED.  Vancomycin random level drawn this morning following the previous doses: 01/27/23 Vr 8 mcg/mL Will give vancomycin 750 mg IV x 1 Ke 0.02 h-1, t1/2 31.5 h [id number] Will follow renal function closely and dose based on next random level due to AKI. Scr 1.4 on 11/16/2022 at [facility], Scr 2.48 on admission down to 1.97 this morning. TBW 58.8 kg < IBW 73 kg Vancomycin random level tomorrow 4/19 with AM labs  Pharmacy will continue to follow and will adjust abx dosing whenever warranted.  Temp (24hrs), Avg:97.8 F (36.6 C), Min:97.5 F (36.4 C), Max:97.9 F (36.6 C)   Recent Labs  Lab 01/25/23 1746 01/26/23 0549 01/27/23 0557  WBC 17.0* 13.2* 9.0  CREATININE 2.48* 2.12* 1.97*  VANCORANDOM  --   --  8     Estimated Creatinine Clearance: 21.1 mL/min (A) (by C-G formula based on SCr of 1.97 mg/dL (H)).    No Known Allergies  Antimicrobials this admission: 4/16 Unasyn >>  4/16 Vancomycin >>   Microbiology results: 04/16 BCx: NG < 12 hrs 04/16 Aerobic Cx: few gram positive cocci  Thank you for allowing pharmacy to be a part of this patient's care.  Burnis Medin PharmD, BCPS 01/27/2023 7:09 AM

## 2023-01-27 NOTE — Progress Notes (Signed)
Subjective:  Patient still does not have his hearing aids and is unable to hear anything.  I saw the patient with OT today.  We wrote down things for patient to read.  Patient does not have a dressing or splint on.  Patient is drowsy today but arousable.  Objective:   VITALS:   Vitals:   01/26/23 1345 01/26/23 1939 01/27/23 0400 01/27/23 0843  BP: 100/75 (!) 157/84 122/70 126/74  Pulse: 92 (!) 110 82 84  Resp: Temp: (!) 97.5 F (36.4 C) 97.9 F (36.6 C) 97.7 F (36.5 C) 98 F (36.7 C)  TempSrc: Oral Oral  Oral  SpO2: 97% 98% 97% 99%  Weight:      Height:        PHYSICAL EXAM: Right hand:  Ring finger:  erythema appears improved.  No extension of swelling or erythema into hand.  No drainage from wound.    Small finger:  + swelling and ecchymosis.  No gross deformity seen.  LABS  Results for orders placed or performed during the hospital encounter of 01/25/23 (from the past 24 hour(s))  Vancomycin, random     Status: None   Collection Time: 01/27/23  5:57 AM  Result Value Ref Range   Vancomycin Rm 8 ug/mL  CBC     Status: Abnormal   Collection Time: 01/27/23  5:57 AM  Result Value Ref Range   WBC 9.0 4.0 - 10.5 K/uL   RBC 3.73 (L) 4.22 - 5.81 MIL/uL   Hemoglobin 11.4 (L) 13.0 - 17.0 g/dL   HCT 29.5 (L) 62.1 - 30.8 %   MCV 90.3 80.0 - 100.0 fL   MCH 30.6 26.0 - 34.0 pg   MCHC 33.8 30.0 - 36.0 g/dL   RDW 65.7 84.6 - 96.2 %   Platelets 307 150 - 400 K/uL   nRBC 0.0 0.0 - 0.2 %  Magnesium     Status: None   Collection Time: 01/27/23  5:57 AM  Result Value Ref Range   Magnesium 1.9 1.7 - 2.4 mg/dL  Comprehensive metabolic panel     Status: Abnormal   Collection Time: 01/27/23  5:57 AM  Result Value Ref Range   Sodium 139 135 - 145 mmol/L   Potassium 4.8 3.5 - 5.1 mmol/L   Chloride 109 98 - 111 mmol/L   CO2 21 (L) 22 - 32 mmol/L   Glucose, Bld 84 70 - 99 mg/dL   BUN 52 (H) 8 - 23 mg/dL   Creatinine, Ser 9.52 (H) 0.61 - 1.24 mg/dL   Calcium 8.8  (L) 8.9 - 10.3 mg/dL   Total Protein 5.8 (L) 6.5 - 8.1 g/dL   Albumin 2.8 (L) 3.5 - 5.0 g/dL   AST 35 15 - 41 U/L   ALT 162 (H) 0 - 44 U/L   Alkaline Phosphatase 80 38 - 126 U/L   Total Bilirubin 0.7 0.3 - 1.2 mg/dL   GFR, Estimated 32 (L) >60 mL/min   Anion gap 9 5 - 15    DG Wrist Complete Right  Result Date: 01/25/2023 CLINICAL DATA:  Right hand ring finger redness and open wound. Cast on from fracture pinky placed 01/18/2023 EXAM: RIGHT WRIST - COMPLETE 3+ VIEW; RIGHT HAND - COMPLETE 3+ VIEW COMPARISON:  Radiographs 01/11/2023 FINDINGS: Redemonstrated comminuted fracture of the fifth finger proximal phalanx. Redemonstrated mildly displaced intra-articular radial styloid fracture. No additional fractures. Soft tissue swelling about the fourth and fifth fingers. IMPRESSION: 1. Redemonstrated comminuted fracture of  the fifth finger proximal phalanx. 2. Redemonstrated mildly displaced intra-articular radial styloid fracture. 3. Soft tissue swelling about the fourth and fifth fingers. Electronically Signed   By: Minerva Fester M.D.   On: 01/25/2023 23:07   DG Hand Complete Right  Result Date: 01/25/2023 CLINICAL DATA:  Right hand ring finger redness and open wound. Cast on from fracture pinky placed 01/18/2023 EXAM: RIGHT WRIST - COMPLETE 3+ VIEW; RIGHT HAND - COMPLETE 3+ VIEW COMPARISON:  Radiographs 01/11/2023 FINDINGS: Redemonstrated comminuted fracture of the fifth finger proximal phalanx. Redemonstrated mildly displaced intra-articular radial styloid fracture. No additional fractures. Soft tissue swelling about the fourth and fifth fingers. IMPRESSION: 1. Redemonstrated comminuted fracture of the fifth finger proximal phalanx. 2. Redemonstrated mildly displaced intra-articular radial styloid fracture. 3. Soft tissue swelling about the fourth and fifth fingers. Electronically Signed   By: Minerva Fester M.D.   On: 01/25/2023 23:07   DG Chest 2 View  Result Date: 01/25/2023 CLINICAL DATA:   Weakness and cough EXAM: CHEST - 2 VIEW COMPARISON:  Chest radiograph 06/08/2021 FINDINGS: Stable cardiomediastinal silhouette. Aortic atherosclerotic calcification. Hyperinflation and chronic bronchitic changes. Bibasilar scarring. No focal consolidation, effusion, or pneumothorax. No displaced rib fractures. Left reverse TSA. IMPRESSION: No active cardiopulmonary disease.  Emphysema. Electronically Signed   By: Minerva Fester M.D.   On: 01/25/2023 20:44    Assessment/Plan:     Principal Problem:   Cellulitis of right ring finger secondary to infected ulcer Active Problems:   Essential hypertension   CAD (coronary artery disease)   COPD (chronic obstructive pulmonary disease)   Acute kidney injury superimposed on CKD IIIb (HCC)   Hyperkalemia   Chronic respiratory failure with hypoxia   History of fracture of right fifth finger on 01/18/23   I rewrapped the patients wound and reapplied the small finger splint.  Patient was instructed to leave them on.  Dressings can be changed prn.  Continue IV antibiotics.  Try and get patient's hearing aids.  His inability to hear is a big impediment to his care right now.   [name] , MD 01/27/2023, 9:44 AM

## 2023-01-27 NOTE — Evaluation (Signed)
Occupational Therapy Evaluation Patient Details Name: George [name] MRN: [id number] DOB: 1932/04/18 Today's Date: 01/27/2023   History of Present Illness Pt is an 89 y/o male with PHM including shoulder surgery, CAD, COPD, CKD, DM2, respiratory failure. Admitted with cellulitis 2/2 recent fx of R little finger with splint applied at OSH around 4/9 and now with a new infected wound because of that rubbing on his ring finger. Has been seen by orthopedics with splint and wrapping provided.   Clinical Impression   Patient received for OT evaluation. See flowsheet below for details of function. Generally, patient MOD (I) for bed mobility, MIN A for functional mobility (would be safer with RW), and supervision-MIN A overall for ADLs. Patient will benefit from continued OT while in acute care.       Recommendations for follow up therapy are one component of a multi-disciplinary discharge planning process, led by the attending physician.  Recommendations may be updated based on patient status, additional functional criteria and insurance authorization.   Assistance Recommended at Discharge Frequent or constant Supervision/Assistance  Patient can return home with the following A little help with walking and/or transfers;A little help with bathing/dressing/bathroom;Assistance with cooking/housework;Direct supervision/assist for medications management;Direct supervision/assist for financial management;Assist for transportation;Help with stairs or ramp for entrance    Functional Status Assessment  Patient has had a recent decline in their functional status and demonstrates the ability to make significant improvements in function in a reasonable and predictable amount of time.  Equipment Recommendations  Tub/shower seat    Recommendations for Other Services       Precautions / Restrictions Precautions Precautions: Other (comment) (R little finger splint on at all times) Required Braces or  Orthoses: Splint/Cast Splint/Cast: R 5th finger splint Restrictions Weight Bearing Restrictions: No (Per discussion with MD (Dr. Martha Clan, ortho), pt is allowed to use R hand and weightbear for RW use as needed.)      Mobility Bed Mobility Overal bed mobility: Modified Independent             General bed mobility comments: with extra time able to t/f to EOB without assistance    Transfers Overall transfer level: Needs assistance Equipment used: 1 person hand held assist Transfers: Sit to/from Stand Sit to Stand: Min guard           General transfer comment: pt reaching for support      Balance Overall balance assessment: Needs assistance Sitting-balance support: Feet supported Sitting balance-Leahy Scale: Good     Standing balance support: Bilateral upper extremity supported, During functional activity Standing balance-Leahy Scale: Poor Standing balance comment: With OT providing 1 hand handheld assist, pt reaching out for support with other hand.                           ADL either performed or assessed with clinical judgement   ADL Overall ADL's : Needs assistance/impaired Eating/Feeding: Set up;Supervision/ safety;Sitting Eating/Feeding Details (indicate cue type and reason): seated at EOB, pt able to eat and drink using R hand mostly; able to use L hand when cutting pancake. Grooming: Wash/dry hands;Min guard;Standing Grooming Details (indicate cue type and reason): pt standing with elbows propped on sink; OT assisted in applying soap to L hand; pt not using R hand much during task (unclear if he is unaware that MD cleared for hand washing at this time 2/2 not having splint on); very fast and not thorough job of handwashing; pt breathing heavily and  appearing fatigued.             Lower Body Dressing: Min guard;Sit to/from stand Lower Body Dressing Details (indicate cue type and reason): Pt able to doff regular socks and don non-skid socks at  EOB. Pt able to doff pants from seated and then standing to fully doff with OT assistance CGA for stability in standing.             Functional mobility during ADLs: Minimal assistance (handheld assist in L hand; pt reaching for furniture in R hand; very unsteady.) General ADL Comments: Very poor activity tolerance today     Vision Baseline Vision/History:  (unknown) Ability to See in Adequate Light: 0 Adequate (Pt is able to read when OT writes to communicate)       Perception     Praxis      Pertinent Vitals/Pain Pain Assessment Pain Assessment: No/denies pain     Hand Dominance Right (appears to be R handed based on using R hand for eating)   Extremity/Trunk Assessment Upper Extremity Assessment Upper Extremity Assessment: Overall WFL for tasks assessed (R dorsal splint in place) RUE Deficits / Details: R ring and little finger swollen and with small wounds; addressed and re-wrapped/splinted by ortho MD at conclusion of session.   Lower Extremity Assessment Lower Extremity Assessment: Overall WFL for tasks assessed (grossly 4-/5 throughout)       Communication Communication Communication: HOH;Other (comment) (can read writing on paper only; unable to hear OT)   Cognition Arousal/Alertness: Awake/alert (falls asleep quickly.) Behavior During Therapy: [facility] for tasks assessed/performed Overall Cognitive Status: Difficult to assess                                 General Comments: Pt follows commands when they are written or given visually; very pleasant and thanks OT at end of session; appears with decreased safety awareness (pt stands in regular socks at EOB; appears unaware that he has urinated on his pants while standing to urinate in urinal).     General Comments       Exercises     Shoulder Instructions      Home Living Family/patient expects to be discharged to:: Private residence Living Arrangements: Alone (has caregiver, per PT  report) Available Help at Discharge: Other (Comment) (caregiver (unclear hours), per medical record.) Type of Home: House Home Access: Stairs to enter Entrance Stairs-Number of Steps: 4   Home Layout: Two level;Able to live on main level with bedroom/bathroom               Home Equipment: Cane - single point;Rolling Walker (2 wheels);BSC/3in1   Additional Comments: Per medical record from 2 years ago, pt has aide; unsure the hours. Home equiment determined from medical recrod      Prior Functioning/Environment Prior Level of Function : Patient poor historian/Family not available;Other (comment) (Pt is deaf without hearing aides; difficulty communicating in-depth)             Mobility Comments: Per PT report (with caregiver present), pt uses RW with +1 assistance for steadying due to poor balance at baseline. No other falls besides the "slip" that lead to the finger fracture. ADLs Comments: Per PT report (with caregiver), pt is assisted PRN with ADLs; total assist for all IADLs at baseline. Caregiver is present 24/7.        OT Problem List: Decreased activity tolerance;Impaired balance (sitting and/or standing)  OT Treatment/Interventions: Self-care/ADL training;Therapeutic exercise;Therapeutic activities;Energy conservation    OT Goals(Current goals can be found in the care plan section) Acute Rehab OT Goals Patient Stated Goal: Go home OT Goal Formulation: Patient unable to participate in goal setting Time For Goal Achievement: 02/10/23 Potential to Achieve Goals: Good ADL Goals Pt Will Perform Grooming: with min guard assist;standing Pt Will Perform Lower Body Bathing: with supervision;sit to/from stand Pt Will Perform Lower Body Dressing: with supervision;sit to/from stand Pt Will Perform Toileting - Clothing Manipulation and hygiene: with supervision;sit to/from stand Pt Will Perform Tub/Shower Transfer: with min guard assist;rolling walker;ambulating;shower  seat  OT Frequency: Min 1X/week    Co-evaluation              AM-PAC OT "6 Clicks" Daily Activity     Outcome Measure Help from another person eating meals?: None Help from another person taking care of personal grooming?: A Little Help from another person toileting, which includes using toliet, bedpan, or urinal?: A Little Help from another person bathing (including washing, rinsing, drying)?: A Lot Help from another person to put on and taking off regular upper body clothing?: A Little Help from another person to put on and taking off regular lower body clothing?: A Little 6 Click Score: 18   End of Session Nurse Communication: Mobility status  Activity Tolerance: Patient limited by fatigue Patient left: in bed;with bed alarm set;with call bell/phone within reach  OT Visit Diagnosis: Unsteadiness on feet (R26.81)                Time: 1610-9604 OT Time Calculation (min): 41 min Charges:  OT General Charges $OT Visit: 1 Visit OT Evaluation $OT Eval Moderate Complexity: 1 Mod OT Treatments $Self Care/Home Management : 23-37 mins Linward Foster, MS, OTR/L  Alvester Morin 01/27/2023, 2:04 PM

## 2023-01-28 DIAGNOSIS — L03011 Cellulitis of right finger: Secondary | ICD-10-CM | POA: Diagnosis not present

## 2023-01-28 LAB — COMPREHENSIVE METABOLIC PANEL
ALT: 126 U/L — ABNORMAL HIGH (ref 0–44)
AST: 33 U/L (ref 15–41)
Albumin: 2.9 g/dL — ABNORMAL LOW (ref 3.5–5.0)
Alkaline Phosphatase: 81 U/L (ref 38–126)
Anion gap: 10 (ref 5–15)
BUN: 38 mg/dL — ABNORMAL HIGH (ref 8–23)
CO2: 20 mmol/L — ABNORMAL LOW (ref 22–32)
Calcium: 8.7 mg/dL — ABNORMAL LOW (ref 8.9–10.3)
Chloride: 109 mmol/L (ref 98–111)
Creatinine, Ser: 1.74 mg/dL — ABNORMAL HIGH (ref 0.61–1.24)
GFR, Estimated: 37 mL/min — ABNORMAL LOW (ref >60)
Glucose, Bld: 82 mg/dL (ref 70–99)
Potassium: 4.8 mmol/L (ref 3.5–5.1)
Sodium: 139 mmol/L (ref 135–145)
Total Bilirubin: 0.8 mg/dL (ref 0.3–1.2)
Total Protein: 5.9 g/dL — ABNORMAL LOW (ref 6.5–8.1)

## 2023-01-28 LAB — VANCOMYCIN, RANDOM: Vancomycin Rm: 10 ug/mL

## 2023-01-28 MED ORDER — VANCOMYCIN HCL IN DEXTROSE 1-5 GM/200ML-% IV SOLN
1000.0000 mg | INTRAVENOUS | Status: DC
  Administered 2023-01-28: 1000 mg via INTRAVENOUS
  Filled 2023-01-28: qty 200

## 2023-01-28 NOTE — TOC Initial Note (Signed)
Transition of Care [facility]) - Initial/Assessment Note    Patient Details  Name: George [name] MRN: [id number] Date of Birth: 1932-09-19  Transition of [facility]) CM/SW Contact:    Chapman Fitch, RN Phone Number: 01/28/2023, 1:46 PM  Clinical Narrative:                   Patient confused, met with patient and POA Renette Butters at bedside  Admitted for: cellulitis  Admitted from:home with caregivers.  Angelique Blonder states patient has caregivers in the home from 7a-9p.  If needed they can increase  ONG:[id number] Current home health/prior home health/DME: cane, RW, bsc  Therapy recommending home health.  Angelique Blonder in agreement and request [employer].  Referral made to [facility] with [facility].    [name] request transport chair. Referral made made to [facility] with [facility]    Expected Discharge Plan: Home w Home Health Services Barriers to Discharge: Continued Medical Work up   Patient Goals and CMS Choice   CMS Medicare.gov Compare Post Acute Care list provided to:: Patient Represenative (must comment) Renette Butters POA) Choice offered to / list presented to : Patient      Expected Discharge Plan and Services   Discharge Planning Services: CM Consult Post Acute Care Choice: Home Health Living arrangements for the past 2 months: Single Family Home                 DME Arranged: Other see comment (transport chair) DME Agency: [employer] Date DME Agency Contacted: 01/28/23   Representative spoke with at DME Agency: Barbara Cower HH Arranged: RN, PT, OT, Nurse's Aide HH Agency: [facility] Date HH Agency Contacted: 01/28/23   Representative spoke with at [facility] Agency: Barbara Cower  Prior Living Arrangements/Services Living arrangements for the past 2 months: Single Family Home Lives with:: Other (Comment) (caregivers)   Do you feel safe going back to the place where you live?: Yes      Need for Family Participation in Patient Care: Yes (Comment) Care  giver support system in place?: Yes (comment) Current home services: DME Criminal Activity/Legal Involvement Pertinent to Current Situation/Hospitalization: No - Comment as needed  Activities of Daily Living Home Assistive Devices/Equipment: Brace (specify type), Walker (specify type), Cane (specify quad or straight), Eyeglasses ADL Screening (condition at time of admission) Patient's cognitive ability adequate to safely complete daily activities?: Yes Is the patient deaf or have difficulty hearing?: No Does the patient have difficulty seeing, even when wearing glasses/contacts?: No Does the patient have difficulty concentrating, remembering, or making decisions?: Yes Patient able to express need for assistance with ADLs?: Yes Does the patient have difficulty dressing or bathing?: Yes Independently performs ADLs?: No Communication: Independent Dressing (OT): Needs assistance Is this a change from baseline?: Pre-admission baseline Grooming: Needs assistance Is this a change from baseline?: Pre-admission baseline Feeding: Independent Bathing: Needs assistance Is this a change from baseline?: Pre-admission baseline Toileting: Needs assistance Is this a change from baseline?: Pre-admission baseline In/Out Bed: Needs assistance Is this a change from baseline?: Pre-admission baseline Walks in Home: Needs assistance Is this a change from baseline?: Pre-admission baseline Does the patient have difficulty walking or climbing stairs?: Yes Weakness of Legs: Both Weakness of Arms/Hands: Right  Permission Sought/Granted                  Emotional Assessment       Orientation: : Oriented to Self      Admission diagnosis:  Cellulitis, finger [L03.019] Cellulitis  of finger of right hand [L03.011] Generalized weakness [R53.1] AKI (acute kidney injury) [N17.9] Cellulitis of right finger [L03.011] Patient Active Problem List   Diagnosis Date Noted   Cellulitis of right ring finger  secondary to infected ulcer 01/25/2023   Chronic respiratory failure with hypoxia 01/25/2023   History of fracture of right fifth finger on 01/18/23 01/25/2023   OSA (obstructive sleep apnea) 01/25/2023   Acute on chronic respiratory failure with hypoxia 06/08/2021   Gout 06/08/2021   Sepsis 06/08/2021   COPD exacerbation 06/08/2021   CAP (community acquired pneumonia) 06/08/2021   Status post reverse arthroplasty of shoulder, left 02/10/2021   Acute kidney injury superimposed on CKD IIIb (HCC) 11/04/2020   BPH with obstruction/lower urinary tract symptoms 11/04/2020   Hyperkalemia 11/04/2020   Acute urinary retention 11/04/2020   Pneumonia 06/25/2019   Foot pain 07/11/2018   Urinary urgency 06/27/2017   Erectile dysfunction 06/27/2017   Stage 3 acute kidney injury 06/27/2017   Hypogonadism in male 06/27/2017   Vomiting 02/09/2016   Slurred speech 02/09/2016   Nausea and vomiting 02/09/2016   Lower abdominal pain 02/09/2016   Tobacco abuse 02/09/2016   Essential hypertension 02/09/2016   GERD (gastroesophageal reflux disease) 02/09/2016   Hyperlipidemia 04/22/2014   Hypertension 04/22/2014   COPD (chronic obstructive pulmonary disease) 04/22/2014   Type II diabetes mellitus with renal manifestations 04/22/2014   CAD (coronary artery disease) 09/10/1998   PCP:  [name], MD Pharmacy:   [facility] - [place], [place] - [address] [place] [address] Phone: [phone] Fax: [phone]     Social Determinants of Health (SDOH) Social History: SDOH Screenings   Food Insecurity: No Food Insecurity (01/26/2023)  Housing: Low Risk  (01/26/2023)  Transportation Needs: No Transportation Needs (01/26/2023)  Utilities: Not At Risk (01/26/2023)  Tobacco Use: High Risk (01/26/2023)   SDOH Interventions:     Readmission Risk Interventions     No data to display

## 2023-01-28 NOTE — Progress Notes (Signed)
Progress Note   Patient: George [name] ZOX:[id number] DOB: 24-May-1932 DOA: 01/25/2023     3 DOS: the patient was seen and examined on 01/28/2023   Brief hospital course: HPI on Admission, late evening 4/16:  "KEROLOS NEHME is a 89 y.o. male with medical history significant for CKD-IIIb, hypertension, COPD, OSA, CAD, stent placement, colon cancer, BPH, who presented to the ED with an ulcer of the right fourth finger associated with pain redness and swelling of the finger.  Patient sustained fracture of the right fifth finger on 4/9 and had been placed in a cast at [facility].  Due to cast was rubbing against his fourth finger, he developed an ulcer and then over the past few days he started having redness with increased pain and swelling of the finger.  ..."  ED course - afebrile, stable vitals with BP mildly soft at 111/57. Labs notable for leukocytosis (WBC 17k), stable mild anemia Hbg 11.3 (baseline), Cr 2.48 (baseline 1.57) and potassium 5.8.  Mildly elevated LFTs with AST 46 and ALT 271.  X-rays of the right hand and wrist showed the known comminuted R fifth proximal phalanx fracture and mildly displace intra-articular radial styloid fracture, with soft tissue swelling of the 4th and 5th fingers.    Orthopedic surgery was consulted by EDP, recommended starting empiric IV antibiotics.    Pt was admitted, started on IV Unasyn and Vancomycin.  Seen by Orthopedics on 4/17.  Non-operative management recommended.  Right 5th finger splinted, to be maintained in split at all times.  Dressing changes to right 4th digit as needed.   Assessment and Plan: * Cellulitis of right ring finger secondary to infected ulcer History of fracture right fifth finger 01/18/2023 s/p cast removal on 416/24 Finger ulcer likely secondary to pressure and/or friction from cast, resulting in cellulitis --Continue IV Unasyn and vancomycin --Follow-up wound and blood cultures Wound culture with few staph lugdunensis within  mixed skin flora -- susceptibilities pending --Orthopedic sugery consulted - see their recommendations.   --Non-operative management with R 5th finger splinted, R 4th finger dressing changes PRN. --Pain control PRN  History of fracture of right fifth finger on 01/18/23 Mgmt per Orthopedic Surgery  Hyperkalemia Resolved with Lokelma. Potassium 5.8 on admission K today 4.8 --Monitor BMP  Acute kidney injury superimposed on CKD IIIb (HCC) Creatinine 2.48 on admission, up from baseline of 1.57, presumed prerenal azotemia. 4/19: Cr improved to 1.74 with fluids. --Stop IV fluids --Encourage PO hydration --Monitor BMP   Chronic respiratory failure with hypoxia .  COPD (chronic obstructive pulmonary disease) Chronic respiratory failure with hypoxia Not acutely exacerbated Home oxygen evaluation Continue home inhalers with DuoNebs as needed Supplement O2, target sats 88-94%  CAD (coronary artery disease) Stable, no chest pain or acute EKG changes. --Continue aspirin, simvastatin and lisinopril  Essential hypertension Soft BP's on admission --Holding verapamil, lisinopril for now --Resume meds as BP tolerates        Subjective: Pt awake sitting up in bed this AM.  He has hearing aids today, able to hear much better.  He reports new onset numbness of his 4th finger today.  Denies significant pain.  No other acute complaints.     Physical Exam: Vitals:   01/27/23 0843 01/27/23 1546 01/27/23 2027 01/28/23 0449  BP: 126/74 134/79 (!) 142/93 (!) 157/84  Pulse: 84 90 99 98  Resp: Temp: 98 F (36.7 C) 97.7 F (36.5 C) 98.2 F (36.8 C) 98.5 F (36.9 C)  TempSrc: Oral     SpO2: 99% 96% 98% 97%  Weight:      Height:       General exam: awake, alert, no acute distress, confused HEENT: moist mucus membranes, very hard of hearing  Respiratory system: CTAB, no wheezes, rales or rhonchi, normal respiratory effort. Cardiovascular system: normal S1/S2, RRR, no  pedal edema.   Gastrointestinal system: soft, NT, ND Central nervous system: alert oriented to self and hospital, no gross focal neurologic deficits, normal speech Extremities: right 4th finger with clean dry intact gauze dressing, splint and bandage to right 5th finger >> forearm Skin: dry, intact, normal temperature Psychiatry: normal mood, congruent affect   Data Reviewed:  Notable labs --- Cr improved 2.12 >> 1.97 >> 1.74, bicarb 20, ALT 126 improved, albumin 2.9  BUN 38 WBC improved 17 >> 13.2 >> 9.0k, Hbg 11.4    Family Communication: None present at bedside, will attempt to call as time allows  Disposition: Status is: Inpatient Remains inpatient appropriate because: Remains on IV antibiotics pending further clinical improvement   Planned Discharge Destination: Home with Home Health    Time spent: 42 minutes  Author: Pennie Banter, DO 01/28/2023 2:19 PM  For on call review [email or web address].

## 2023-01-28 NOTE — Progress Notes (Addendum)
Occupational Therapy Treatment Patient Details Name: George [name] MRN: [id number] DOB: [birth year] Today's Date: 01/28/2023   History of present illness Pt is an 89 y/o male with PHM including shoulder surgery, CAD, COPD, CKD, DM2, respiratory failure. Admitted with cellulitis 2/2 recent fx of R little finger with splint applied at OSH around 4/9 and now with a new infected wound because of that rubbing on his ring finger. Has been seen by orthopedics with splint and wrapping provided.   OT comments  Pt received semi-reclined in bed. Appearing alert; hearing aides in (pt could hear OT at high volume of speech); willing to work with OT on sitting EOB/activity tolerance. See flowsheet below for further details of session. Left semi-reclined in bed, bed alarm on, with all needs in reach.  Patient will benefit from continued OT while in acute care. Of note, pt made comment while lamenting his hand injury and general malaise: "Sometimes I wish I could just go on and be done with it." OT reported to RN and MD. Otherwise, pt following cues during session, pleasant, grateful for assistance of staff, and joking with OT.      Recommendations for follow up therapy are one component of a multi-disciplinary discharge planning process, led by the attending physician.  Recommendations may be updated based on patient status, additional functional criteria and insurance authorization.    Assistance Recommended at Discharge Frequent or constant Supervision/Assistance (Recommend transfers only at this time due to dizziness and poor mobility.)  Patient can return home with the following  A little help with walking and/or transfers;A little help with bathing/dressing/bathroom;Assistance with cooking/housework;Direct supervision/assist for medications management;Direct supervision/assist for financial management;Assist for transportation;Help with stairs or ramp for entrance   Equipment Recommendations  Tub/shower  seat;Other (comment) (Pt will need transport w/c if he doesn't already have one.)    Recommendations for Other Services      Precautions / Restrictions Precautions Precautions: Fall;Other (comment) Precaution Comments: R little finger splint; R ring finger wrapped Required Braces or Orthoses: Splint/Cast Splint/Cast: R 5th finger splint Restrictions Weight Bearing Restrictions: No (Per ortho MD, WBAT R hand)       Mobility Bed Mobility Overal bed mobility: Modified Independent Bed Mobility: Supine to Sit, Sit to Supine     Supine to sit: Modified independent (Device/Increase time) Sit to supine: Modified independent (Device/Increase time)   General bed mobility comments: extra time and HOB elevated    Transfers Overall transfer level: Needs assistance Equipment used: Rolling walker (2 wheels) Transfers: Sit to/from Stand Sit to Stand: Min guard, From elevated surface                 Balance Overall balance assessment: Needs assistance Sitting-balance support: Feet supported Sitting balance-Leahy Scale: Good     Standing balance support: Bilateral upper extremity supported, During functional activity Standing balance-Leahy Scale: Fair Standing balance comment: RW with OT CGA for safety                           ADL either performed or assessed with clinical judgement   ADL Overall ADL's : Needs assistance/impaired Eating/Feeding: Set up;Supervision/ safety;Bed level Eating/Feeding Details (indicate cue type and reason): assisted with set up for breakfast with pt sitting up in bed             Upper Body Dressing : Maximal assistance Upper Body Dressing Details (indicate cue type and reason): OT assisted pt MAX A to doff shirt while  seated EOB as he needed a new gown.         Toileting- Clothing Manipulation and Hygiene: Total assistance;Sit to/from stand Toileting - Clothing Manipulation Details (indicate cue type and reason): Pt standing  with OT CGA at RW for safety, forward flexed posture; RN assisting with dependent toilet hygiene.       General ADL Comments: Pt fatigued with standing; states dizzy while seated and standing. Poor activity tolerance.    Extremity/Trunk Assessment Upper Extremity Assessment Upper Extremity Assessment: RUE deficits/detail RUE Deficits / Details: R 4th and 5th fingers impaired (wrapped/splinted)            Vision       Perception     Praxis      Cognition Arousal/Alertness: Awake/alert Behavior During Therapy: WFL for tasks assessed/performed Overall Cognitive Status: History of cognitive impairments - at baseline                                 General Comments: Pt states that "my memory is about 5 minutes long".        Exercises      Shoulder Instructions       General Comments Pt able to take some sidesteps at EOB (approx 2 feet) to the R with CGA and cues from OT. Pt reporting dizziness at EOB; BP 145/95. After sitting a few minutes at EOB to see if dizziness would resolve, pt pulled on IV and it began profusely bleeding (from LUE); OT applied pressure and RN came to assist.    Pertinent Vitals/ Pain       Pain Assessment Pain Assessment: 0-10 Pain Score:  (unrated; high) Pain Location: R 5th finger Pain Descriptors / Indicators: Aching Pain Intervention(s): Limited activity within patient's tolerance, Monitored during session  Home Living                                          Prior Functioning/Environment              Frequency  Min 1X/week        Progress Toward Goals  OT Goals(current goals can now be found in the care plan section)  Progress towards OT goals: Progressing toward goals  Acute Rehab OT Goals Patient Stated Goal: Get better OT Goal Formulation: Patient unable to participate in goal setting Time For Goal Achievement: 02/10/23 Potential to Achieve Goals: Good ADL Goals Pt Will Perform  Grooming: with min guard assist;standing Pt Will Perform Lower Body Bathing: with supervision;sit to/from stand Pt Will Perform Lower Body Dressing: with supervision;sit to/from stand Pt Will Perform Toileting - Clothing Manipulation and hygiene: with supervision;sit to/from stand Pt Will Perform Tub/Shower Transfer: with min guard assist;rolling walker;ambulating;shower seat  Plan Discharge plan remains appropriate    Co-evaluation                 AM-PAC OT "6 Clicks" Daily Activity     Outcome Measure   Help from another person eating meals?: None Help from another person taking care of personal grooming?: A Little Help from another person toileting, which includes using toliet, bedpan, or urinal?: A Little Help from another person bathing (including washing, rinsing, drying)?: A Lot Help from another person to put on and taking off regular upper body clothing?: A Lot Help from another person to  put on and taking off regular lower body clothing?: A Lot 6 Click Score: 16    End of Session Equipment Utilized During Treatment: Rolling walker (2 wheels)  OT Visit Diagnosis: Unsteadiness on feet (R26.81)   Activity Tolerance Patient limited by fatigue   Patient Left in bed;with bed alarm set;with call bell/phone within reach   Nurse Communication Mobility status        Time: 4098-1191 OT Time Calculation (min): 29 min  Charges: OT General Charges $OT Visit: 1 Visit OT Treatments $Self Care/Home Management : 23-37 mins  Linward Foster, MS, OTR/L   Alvester Morin 01/28/2023, 9:52 AM

## 2023-01-28 NOTE — Care Management Important Message (Signed)
Important Message  Patient Details  Name: George [name] MRN: [id number] Date of Birth: 1932/06/20   Medicare Important Message Given:  Yes     Johnell Comings 01/28/2023, 2:28 PM

## 2023-01-28 NOTE — Progress Notes (Signed)
Pharmacy Antibiotic Note  George [name] is a 89 y.o. male w/ PMH of CKD-IIIb, hypertension, COPD, OSA, CAD, stent placement, colon cancer, BPH admitted on 01/25/2023 with cellulitis.  Pharmacy has been consulted for Unasyn & Vancomycin dosing    Plan:  Day 4 : 1) continue Unasyn 3 grams IV every 12 hours  --Crcl 23.9 ml/min  2) vancomycin Vancomycin random level 4/19 @ 0551= 10 mcg/ml  Will order Vancomycin 1000 mg IV q48h Goal AUC 400-550. Expected AUC: 486 SCr used: 1.74 Cmin 11 TBW 58.8 kg < IBW 73 kg  Will follow renal function closely  Scr 1.4 on 11/16/2022 at [facility], Scr 2.48 on admission down to 1.74 this morning.  Vancomycin random level tomorrow 4/20 with AM labs to assess    Temp (24hrs), Avg:98.1 F (36.7 C), Min:97.7 F (36.5 C), Max:98.5 F (36.9 C)   Recent Labs  Lab 01/25/23 1746 01/26/23 0549 01/27/23 0557 01/28/23 0551  WBC 17.0* 13.2* 9.0  --   CREATININE 2.48* 2.12* 1.97* 1.74*  VANCORANDOM  --   --  8 10     Estimated Creatinine Clearance: 23.9 mL/min (A) (by C-G formula based on SCr of 1.74 mg/dL (H)).    No Known Allergies  Antimicrobials this admission: 4/16 Unasyn >>  4/16 Vancomycin >>   Microbiology results: 04/16 BCx: NG x3d 04/16 Aerobic Cx: few gram positive cocci, multiple organisms  Thank you for allowing pharmacy to be a part of this patient's care.  Bari Mantis PharmD Clinical Pharmacist 01/28/2023

## 2023-01-28 NOTE — Progress Notes (Signed)
Subjective:  Patient OOB to chair.  He is using a urinal when I first arrived.  Patient states he has numbness in his ring finger but no significant pain at rest.    Objective:   VITALS:   Vitals:   01/27/23 0843 01/27/23 1546 01/27/23 2027 01/28/23 0449  BP: 126/74 134/79 (!) 142/93 (!) 157/84  Pulse: 84 90 99 98  Resp: Temp: 98 F (36.7 C) 97.7 F (36.5 C) 98.2 F (36.8 C) 98.5 F (36.9 C)  TempSrc: Oral     SpO2: 99% 96% 98% 97%  Weight:      Height:        PHYSICAL EXAM: Right hand: Ring finger: I personally changed the patient's dressing over the ring finger putting a smaller dressing on with Tegaderm.  Swelling and erythema continue to improve.  He has intact incision over the proximal phalanx.  Patient has reduced sensation to light touch over the middle and distal phalanx.  The volar wound demonstrates no active drainage or surrounding erythema.  There is no area of fluctuance.  Small finger: Patient has diffuse swelling and resolving ecchymosis.  The majority the swelling is over the proximal phalanx where his fracture is located.  Patient has limited range of motion of both digits due to swelling.  He has intact sensation light touch in the small finger.  He has tenderness over the proximal phalanx of the small finger as well.  There is no obvious deformity.  LABS  Results for orders placed or performed during the hospital encounter of 01/25/23 (from the past 24 hour(s))  Vancomycin, random     Status: None   Collection Time: 01/28/23  5:51 AM  Result Value Ref Range   Vancomycin Rm 10 ug/mL  Comprehensive metabolic panel     Status: Abnormal   Collection Time: 01/28/23  5:51 AM  Result Value Ref Range   Sodium 139 135 - 145 mmol/L   Potassium 4.8 3.5 - 5.1 mmol/L   Chloride 109 98 - 111 mmol/L   CO2 20 (L) 22 - 32 mmol/L   Glucose, Bld 82 70 - 99 mg/dL   BUN 38 (H) 8 - 23 mg/dL   Creatinine, Ser 1.61 (H) 0.61 - 1.24 mg/dL   Calcium 8.7 (L) 8.9 -  10.3 mg/dL   Total Protein 5.9 (L) 6.5 - 8.1 g/dL   Albumin 2.9 (L) 3.5 - 5.0 g/dL   AST 33 15 - 41 U/L   ALT 126 (H) 0 - 44 U/L   Alkaline Phosphatase 81 38 - 126 U/L   Total Bilirubin 0.8 0.3 - 1.2 mg/dL   GFR, Estimated 37 (L) >60 mL/min   Anion gap 10 5 - 15    No results found.  Assessment/Plan:     Principal Problem:   Cellulitis of right ring finger secondary to infected ulcer Active Problems:   Essential hypertension   CAD (coronary artery disease)   COPD (chronic obstructive pulmonary disease)   Acute kidney injury superimposed on CKD IIIb (HCC)   Hyperkalemia   History of fracture of right fifth finger on 01/18/23  Continue splinting for the right small finger proximal phalanx fracture.  No surgical intervention is required at this time.  Continue IV antibiotics for right ring finger cellulitis.  There is no active drainage from the wound and no evidence of fluctuance or abscess that requires drainage at this time.  I suspect the patient's paresthesias in the ring finger are from  his recent swelling.  I expect the feeling will return over time.  I will continue to follow.      [name] , MD 01/28/2023, 12:40 PM

## 2023-01-28 NOTE — Progress Notes (Signed)
Patient removed splint from hand, provider notified new splint will be placed tomorrow.

## 2023-01-28 NOTE — Progress Notes (Signed)
  Patient requires transport chair. Patient is unable to self propel in a standard wheelchair.  Patient has private pay care givers to provide assistance with transport chair

## 2023-01-28 NOTE — Progress Notes (Signed)
Physical Therapy Treatment Patient Details Name: George [name] MRN: [id number] DOB: [birth year] Today's Date: 01/28/2023   History of Present Illness Pt is an 89 y/o male with PHM including shoulder surgery, CAD, COPD, CKD, DM2, respiratory failure. Admitted with cellulitis 2/2 recent fx of R little finger with splint applied at OSH around 4/9 and now with a new infected wound because of that rubbing on his ring finger. Has been seen by orthopedics with splint and wrapping provided.    PT Comments    Pt received in bed, very HOH despite hearing aids being on. Pt demonstrated ability to transfer to EOB with ModI, no dizziness noted in sitting EOB for 5 minutes while utilizing urinal. Pt able to stand from low bed and ambulate in room with support of RW and Supervision/CGA. Slight SOB with minimal exertion, no dizziness or LOB while negotiating in tight spaces. Pt requires repeated cues due to baseline dementia and STM deficits. Overall, very pleasant and appreciative. HHPT once cleared for d/c with.   Recommendations for follow up therapy are one component of a multi-disciplinary discharge planning process, led by the attending physician.  Recommendations may be updated based on patient status, additional functional criteria and insurance authorization.  Follow Up Recommendations       Assistance Recommended at Discharge Intermittent Supervision/Assistance  Patient can return home with the following A little help with walking and/or transfers;A little help with bathing/dressing/bathroom;Assistance with cooking/housework;Assist for transportation;Help with stairs or ramp for entrance   Equipment Recommendations  Wheelchair (measurements PT);Wheelchair cushion (measurements PT)    Recommendations for Other Services       Precautions / Restrictions Precautions Precautions: Fall;Other (comment) Precaution Comments: R little finger splint; R ring finger wrapped Required Braces or Orthoses:  Splint/Cast Splint/Cast: R 5th finger splint Restrictions Weight Bearing Restrictions: No Other Position/Activity Restrictions:  (Per Ortho MD, WBAT R UE)     Mobility  Bed Mobility Overal bed mobility: Modified Independent Bed Mobility: Supine to Sit, Sit to Supine     Supine to sit: Modified independent (Device/Increase time) Sit to supine: Modified independent (Device/Increase time)   General bed mobility comments: extra time and HOB elevated    Transfers Overall transfer level: Needs assistance Equipment used: Rolling walker (2 wheels) Transfers: Sit to/from Stand Sit to Stand: Min guard, From elevated surface           General transfer comment:  (Flexed posture with flexed B knees in standing)    Ambulation/Gait Ambulation/Gait assistance: Supervision Gait Distance (Feet):  (20) Assistive device: Rolling walker (2 wheels) Gait Pattern/deviations: Step-to pattern, Trunk flexed, Wide base of support, Decreased step length - right, Decreased step length - left Gait velocity: decreased     General Gait Details:  (No reported dizziness this session)   Stairs             Wheelchair Mobility    Modified Rankin (Stroke Patients Only)       Balance Overall balance assessment: Needs assistance Sitting-balance support: Feet supported Sitting balance-Leahy Scale: Good     Standing balance support: Bilateral upper extremity supported, During functional activity Standing balance-Leahy Scale: Fair Standing balance comment: RW with OT CGA for safety                            Cognition Arousal/Alertness: Awake/alert Behavior During Therapy: WFL for tasks assessed/performed Overall Cognitive Status: History of cognitive impairments - at baseline  General Comments: Pt states that "my memory is about 5 minutes long".        Exercises General Exercises - Lower Extremity Ankle Circles/Pumps: AROM,  Both, 10 reps Long Arc Quad: AROM, Both, 10 reps Hip Flexion/Marching: AROM, Both, 10 reps, Seated    General Comments General comments (skin integrity, edema, etc.): Pt with new c/o Right 4th and 5th finger numbness, Dr. Denton Lank notified and in to assess      Pertinent Vitals/Pain      Home Living                          Prior Function            PT Goals (current goals can now be found in the care plan section) Acute Rehab PT Goals Patient Stated Goal: to get better    Frequency    Min 4X/week      PT Plan Current plan remains appropriate    Co-evaluation              AM-PAC PT "6 Clicks" Mobility   Outcome Measure  Help needed turning from your back to your side while in a flat bed without using bedrails?: A Little Help needed moving from lying on your back to sitting on the side of a flat bed without using bedrails?: A Little Help needed moving to and from a bed to a chair (including a wheelchair)?: A Little Help needed standing up from a chair using your arms (e.g., wheelchair or bedside chair)?: A Little Help needed to walk in hospital room?: A Little Help needed climbing 3-5 steps with a railing? : A Lot 6 Click Score: 17    End of Session Equipment Utilized During Treatment: Gait belt Activity Tolerance: Patient tolerated treatment well Patient left: in chair;with call bell/phone within reach;with chair alarm set;Other (comment) (Note for pt at bedside on how to call for assistance) Nurse Communication: Mobility status PT Visit Diagnosis: Muscle weakness (generalized) (M62.81);Difficulty in walking, not elsewhere classified (R26.2)     Time: 1100-1131 PT Time Calculation (min) (ACUTE ONLY): 31 min  Charges:  $Gait Training: 8-22 mins $Therapeutic Exercise: 8-22 mins                    Zadie Cleverly, PTA    Jannet Askew 01/28/2023, 1:01 PM

## 2023-01-29 DIAGNOSIS — L03011 Cellulitis of right finger: Secondary | ICD-10-CM | POA: Diagnosis not present

## 2023-01-29 LAB — CBC
HCT: 32.6 % — ABNORMAL LOW (ref 39.0–52.0)
Hemoglobin: 11.1 g/dL — ABNORMAL LOW (ref 13.0–17.0)
MCH: 30.7 pg (ref 26.0–34.0)
MCHC: 34 g/dL (ref 30.0–36.0)
MCV: 90.3 fL (ref 80.0–100.0)
Platelets: 316 10*3/uL (ref 150–400)
RBC: 3.61 MIL/uL — ABNORMAL LOW (ref 4.22–5.81)
RDW: 13.2 % (ref 11.5–15.5)
WBC: 9.6 10*3/uL (ref 4.0–10.5)
nRBC: 0 % (ref 0.0–0.2)

## 2023-01-29 LAB — BASIC METABOLIC PANEL
Anion gap: 9 (ref 5–15)
BUN: 32 mg/dL — ABNORMAL HIGH (ref 8–23)
CO2: 21 mmol/L — ABNORMAL LOW (ref 22–32)
Calcium: 8.8 mg/dL — ABNORMAL LOW (ref 8.9–10.3)
Chloride: 109 mmol/L (ref 98–111)
Creatinine, Ser: 1.69 mg/dL — ABNORMAL HIGH (ref 0.61–1.24)
GFR, Estimated: 38 mL/min — ABNORMAL LOW (ref >60)
Glucose, Bld: 85 mg/dL (ref 70–99)
Potassium: 4.3 mmol/L (ref 3.5–5.1)
Sodium: 139 mmol/L (ref 135–145)

## 2023-01-29 LAB — VANCOMYCIN, RANDOM: Vancomycin Rm: 14 ug/mL

## 2023-01-29 MED ORDER — AMOXICILLIN-POT CLAVULANATE 875-125 MG PO TABS: 1.0000 | ORAL_TABLET | Freq: Two times a day (BID) | ORAL | 0 refills | Status: AC

## 2023-01-29 MED ORDER — RISAQUAD PO CAPS
2.0000 | ORAL_CAPSULE | Freq: Every day | ORAL | Status: DC
  Administered 2023-01-29: 2 via ORAL
  Filled 2023-01-29: qty 2

## 2023-01-29 MED ORDER — VERAPAMIL HCL ER 240 MG PO TBCR
240.0000 mg | EXTENDED_RELEASE_TABLET | Freq: Two times a day (BID) | ORAL | Status: DC
  Administered 2023-01-29: 240 mg via ORAL
  Filled 2023-01-29: qty 1

## 2023-01-29 MED ORDER — AMOXICILLIN-POT CLAVULANATE 875-125 MG PO TABS
1.0000 | ORAL_TABLET | Freq: Two times a day (BID) | ORAL | Status: DC
  Administered 2023-01-29: 1 via ORAL
  Filled 2023-01-29: qty 1

## 2023-01-29 MED ORDER — DOXYCYCLINE HYCLATE 100 MG PO TABS
100.0000 mg | ORAL_TABLET | Freq: Two times a day (BID) | ORAL | Status: DC
  Administered 2023-01-29: 100 mg via ORAL
  Filled 2023-01-29: qty 1

## 2023-01-29 MED ORDER — RISAQUAD PO CAPS: 2.0000 | ORAL_CAPSULE | Freq: Every day | ORAL | 0 refills | Status: DC

## 2023-01-29 MED ORDER — DOXYCYCLINE HYCLATE 100 MG PO TABS: 100.0000 mg | ORAL_TABLET | Freq: Two times a day (BID) | ORAL | 0 refills | Status: AC

## 2023-01-29 MED ORDER — HYDROCODONE-ACETAMINOPHEN 5-325 MG PO TABS: 1.0000 | ORAL_TABLET | ORAL | 0 refills | Status: DC | PRN

## 2023-01-29 MED ORDER — BACID PO TABS
2.0000 | ORAL_TABLET | Freq: Three times a day (TID) | ORAL | Status: DC
  Filled 2023-01-29: qty 2

## 2023-01-29 NOTE — Discharge Summary (Signed)
Physician Discharge Summary   Patient: George [name] MRN: [id number] DOB: 24-Apr-1932  Admit date:     01/25/2023  Discharge date: 01/31/23  Discharge Physician: Pennie Banter   PCP: [name], MD   Recommendations at discharge:   Follow up this coming week with Dr. Shela Nevin at [facility] Go to [facility] if any issues arise before follow up Follow up with Primary Care in 1-2 weeks Repeat CBC, BMP in 1-2 weeks  Discharge Diagnoses: Principal Problem:   Cellulitis of right ring finger secondary to infected ulcer Active Problems:   History of fracture of right fifth finger on 01/18/23   Acute kidney injury superimposed on CKD IIIb (HCC)   Hyperkalemia   Essential hypertension   CAD (coronary artery disease)   COPD (chronic obstructive pulmonary disease)  Resolved Problems:   * No resolved hospital problems. [facility] Course: HPI on Admission, late evening 4/16:  "George [name] is a 89 y.o. male with medical history significant for CKD-IIIb, hypertension, COPD, OSA, CAD, stent placement, colon cancer, BPH, who presented to the ED with an ulcer of the right fourth finger associated with pain redness and swelling of the finger.  Patient sustained fracture of the right fifth finger on 4/9 and had been placed in a cast at [facility].  Due to cast was rubbing against his fourth finger, he developed an ulcer and then over the past few days he started having redness with increased pain and swelling of the finger.  ..."  ED course - afebrile, stable vitals with BP mildly soft at 111/57. Labs notable for leukocytosis (WBC 17k), stable mild anemia Hbg 11.3 (baseline), Cr 2.48 (baseline 1.57) and potassium 5.8.  Mildly elevated LFTs with AST 46 and ALT 271.  X-rays of the right hand and wrist showed the known comminuted R fifth proximal phalanx fracture and mildly displace intra-articular radial styloid fracture, with soft tissue swelling of the 4th and 5th fingers.     Orthopedic surgery was consulted by EDP, recommended starting empiric IV antibiotics.    Pt was admitted, started on IV Unasyn and Vancomycin.  Seen by Orthopedics on 4/17.  Non-operative management recommended.  Right 5th finger splinted, to be maintained in split at all times.  Dressing changes to right 4th digit as needed.  4/20 -- pt doing well.  He removed the splint again yesterday afternoon, it was replaced this AM by Ortho. Pt is stable to d/c on PO antibiotics and follow up at Ortho clinic closely.   POA updated and in agreement.   Assessment and Plan: * Cellulitis of right ring finger secondary to infected ulcer History of fracture right fifth finger 01/18/2023 s/p cast removal on 416/24 Finger ulcer likely secondary to pressure and/or friction from cast, resulting in cellulitis --Treated with empiric IV Unasyn and vancomycin --Discharge on PO Augmentin and Doxy --Follow-up wound and blood cultures Wound culture with few staph lugdunensis within mixed skin flora -- susceptibilities pending --Orthopedic sugery consulted - see their recommendations.   --Non-operative management with R 5th finger splinted, R 4th finger dressing changes PRN. --Pain control PRN  History of fracture of right fifth finger on 01/18/23 Mgmt per Orthopedic Surgery  Hyperkalemia Resolved with Lokelma. Potassium 5.8 on admission K today 4.8 --Monitor BMP  Acute kidney injury superimposed on CKD IIIb (HCC) Creatinine 2.48 on admission, up from baseline of 1.57, presumed prerenal azotemia. 4/19: Cr improved to 1.69 with fluids. --Stop IV fluids --Encourage PO hydration --Monitor BMP  Chronic respiratory failure with hypoxia .  COPD (chronic obstructive pulmonary disease) Chronic respiratory failure with hypoxia Not acutely exacerbated Home oxygen evaluation Continue home inhalers with DuoNebs as needed Supplement O2, target sats 88-94%  CAD (coronary artery disease) Stable, no chest  pain or acute EKG changes. --Continue aspirin, simvastatin and lisinopril  Essential hypertension Soft BP's on admission --Holding verapamil, lisinopril for now --Resume meds as BP tolerates   Chronic hypoxic respiratory failure -- ruled out Pt was tested for home O2 qualification but maintained sats in mid 90's ambulating on room air.       Consultants: Orthopedic surgery Procedures performed: None  Disposition: Home health Diet recommendation:  Regular diet DISCHARGE MEDICATION: Allergies as of 01/29/2023   No Known Allergies      Medication List     STOP taking these medications    lisinopril 20 MG tablet Commonly known as: ZESTRIL   testosterone cypionate 200 MG/ML injection Commonly known as: DEPOTESTOSTERONE CYPIONATE       TAKE these medications    acidophilus Caps capsule Take 2 capsules by mouth daily.   albuterol 108 (90 Base) MCG/ACT inhaler Commonly known as: VENTOLIN HFA Inhale 1-2 puffs into the lungs every 6 (six) hours as needed for wheezing or shortness of breath.   albuterol (2.5 MG/3ML) 0.083% nebulizer solution Commonly known as: PROVENTIL Take 2.5 mg by nebulization every 6 (six) hours as needed for wheezing.   amoxicillin-clavulanate 875-125 MG tablet Commonly known as: AUGMENTIN Take 1 tablet by mouth 2 (two) times daily with a meal for 5 days.   aspirin EC 81 MG tablet Take 81 mg by mouth daily. Swallow whole.   colchicine 0.6 MG tablet Take 0.5 tablets (0.3 mg total) by mouth daily.   cyanocobalamin 1000 MCG/ML injection Commonly known as: VITAMIN B12 Inject 1,000 mcg into the muscle every 30 (thirty) days.   doxycycline 100 MG tablet Commonly known as: VIBRA-TABS Take 1 tablet (100 mg total) by mouth 2 (two) times daily with a meal for 5 days.   HYDROcodone-acetaminophen 5-325 MG tablet Commonly known as: NORCO/VICODIN Take 1-2 tablets by mouth every 4 (four) hours as needed for moderate pain.   hyoscyamine 0.125 MG  tablet Commonly known as: LEVSIN Take 0.125 mg by mouth every 4 (four) hours as needed for cramping.   omeprazole 20 MG capsule Commonly known as: PRILOSEC Take 20 mg by mouth every morning.   simvastatin 20 MG tablet Commonly known as: ZOCOR Take 10 mg by mouth at bedtime.   traMADol 50 MG tablet Commonly known as: ULTRAM Take 50 mg by mouth every 8 (eight) hours as needed for moderate pain.   Trelegy Ellipta 100-62.5-25 MCG/ACT Aepb Generic drug: Fluticasone-Umeclidin-Vilant Inhale 1 puff into the lungs every morning.   verapamil 240 MG CR tablet Commonly known as: CALAN-SR Take 240 mg by mouth 2 (two) times daily.        Discharge Exam: Filed Weights   01/25/23 1743 01/29/23 0448  Weight: 58.8 kg 66.3 kg   General exam: awake, alert, no acute distress, confused HEENT: atraumatic, clear conjunctiva, anicteric sclera, moist mucus membranes, hearing grossly normal  Respiratory system: CTAB, no wheezes, rales or rhonchi, normal respiratory effort. Cardiovascular system: normal S1/S2, RRR, no JVD, murmurs, rubs, gallops, no pedal edema.   Gastrointestinal system: soft, NT, ND, no HSM felt, +bowel sounds. Central nervous system: A&O x self. no gross focal neurologic deficits, normal speech Extremities: right 4th finger cellulitis and swelling, right 5th finger swelling, no edema,  normal tone Skin: dry, intact, normal temperature Psychiatry: normal mood, congruent affect, judgement and insight appear normal   Condition at discharge: stable  The results of significant diagnostics from this hospitalization (including imaging, microbiology, ancillary and laboratory) are listed below for reference.   Imaging Studies: DG Wrist Complete Right  Result Date: 01/25/2023 CLINICAL DATA:  Right hand ring finger redness and open wound. Cast on from fracture pinky placed 01/18/2023 EXAM: RIGHT WRIST - COMPLETE 3+ VIEW; RIGHT HAND - COMPLETE 3+ VIEW COMPARISON:  Radiographs 01/11/2023  FINDINGS: Redemonstrated comminuted fracture of the fifth finger proximal phalanx. Redemonstrated mildly displaced intra-articular radial styloid fracture. No additional fractures. Soft tissue swelling about the fourth and fifth fingers. IMPRESSION: 1. Redemonstrated comminuted fracture of the fifth finger proximal phalanx. 2. Redemonstrated mildly displaced intra-articular radial styloid fracture. 3. Soft tissue swelling about the fourth and fifth fingers. Electronically Signed   By: Minerva Fester M.D.   On: 01/25/2023 23:07   DG Hand Complete Right  Result Date: 01/25/2023 CLINICAL DATA:  Right hand ring finger redness and open wound. Cast on from fracture pinky placed 01/18/2023 EXAM: RIGHT WRIST - COMPLETE 3+ VIEW; RIGHT HAND - COMPLETE 3+ VIEW COMPARISON:  Radiographs 01/11/2023 FINDINGS: Redemonstrated comminuted fracture of the fifth finger proximal phalanx. Redemonstrated mildly displaced intra-articular radial styloid fracture. No additional fractures. Soft tissue swelling about the fourth and fifth fingers. IMPRESSION: 1. Redemonstrated comminuted fracture of the fifth finger proximal phalanx. 2. Redemonstrated mildly displaced intra-articular radial styloid fracture. 3. Soft tissue swelling about the fourth and fifth fingers. Electronically Signed   By: Minerva Fester M.D.   On: 01/25/2023 23:07   DG Chest 2 View  Result Date: 01/25/2023 CLINICAL DATA:  Weakness and cough EXAM: CHEST - 2 VIEW COMPARISON:  Chest radiograph 06/08/2021 FINDINGS: Stable cardiomediastinal silhouette. Aortic atherosclerotic calcification. Hyperinflation and chronic bronchitic changes. Bibasilar scarring. No focal consolidation, effusion, or pneumothorax. No displaced rib fractures. Left reverse TSA. IMPRESSION: No active cardiopulmonary disease.  Emphysema. Electronically Signed   By: Minerva Fester M.D.   On: 01/25/2023 20:44   DG Hand Complete Right  Result Date: 01/11/2023 CLINICAL DATA:  Pain and bruising,  fall, pain in the small finger EXAM: RIGHT HAND - COMPLETE 3+ VIEW COMPARISON:  None Available. FINDINGS: Comminuted fracture of the proximal phalanx small finger involves the midshaft, proximal metaphysis, and with questionable extension to the proximal articular surface. Mild degenerative loss of articular space in the interphalangeal joints. Small linear calcification along the ulnar base of the proximal phalanx of the fourth finger is probably a physiologic calcification and less likely to be a small avulsion of the ulnar collateral ligament. Chondrocalcinosis along the TFCC disc and lunatotriquetral ligament. Degenerative loss of articular space at the radiocarpal joint. There is some mild spurring of the first carpometacarpal articulation. On the lateral projection, there is cortical discontinuity along the volar distal radius extending from the metaphysis to the distal articular surface. Articular involvement is also suggested on the oblique projection. Pronator fat pad indistinct in this region. IMPRESSION: 1. Comminuted fracture of the proximal phalanx of the small finger involving the midshaft, proximal metaphysis, and with questionable extension to the proximal articular surface. 2. Distal radial metaphyseal fracture as seen along the volar portion, extending to the distal articular surface. 3. Degenerative findings in the hand and wrist. 4. Chondrocalcinosis. Electronically Signed   By: Gaylyn Rong M.D.   On: 01/11/2023 17:51   DG Elbow Complete Right  Result Date: 01/11/2023 CLINICAL DATA:  Pain  and bruising, swelling, fell EXAM: RIGHT ELBOW - COMPLETE 3+ VIEW COMPARISON:  None Available. FINDINGS: Frontal, bilateral oblique, and lateral views of the right elbow are obtained on 6 images. No acute fracture, subluxation, or dislocation. Mild osteoarthritis. No joint effusion. Prominent these a pathic changes along the olecranon. Soft tissues are unremarkable. IMPRESSION: 1. Mild osteoarthritis.   No acute fracture. Electronically Signed   By: Sharlet Salina M.D.   On: 01/11/2023 17:51   DG Wrist Complete Right  Result Date: 01/11/2023 CLINICAL DATA:  Pain, bruising, and swelling status post fall. EXAM: RIGHT WRIST - COMPLETE 3+ VIEW COMPARISON:  None Available. FINDINGS: Diffuse osteopenia. Normal alignment. No fracture or dislocation. Carpal rows intact, with some chondrocalcinosis noted. Mild degenerative change at the STT and first [facility] joint. Regional soft tissues unremarkable. IMPRESSION: No acute findings. Osteopenia and degenerative changes as above. Electronically Signed   By: Corlis Leak M.D.   On: 01/11/2023 17:49    Microbiology: Results for orders placed or performed during the hospital encounter of 01/25/23  Blood culture (single)     Status: None   Collection Time: 01/25/23  5:46 PM   Specimen: BLOOD  Result Value Ref Range Status   Specimen Description BLOOD BLOOD RIGHT ARM  Final   Special Requests   Final    BOTTLES DRAWN AEROBIC AND ANAEROBIC Blood Culture adequate volume   Culture   Final    NO GROWTH 5 DAYS Performed at [facility], [address], [place], [place] [address]    Report Status 01/30/2023 FINAL  Final  Aerobic Culture w Gram Stain (superficial specimen)     Status: None   Collection Time: 01/25/23  9:34 PM   Specimen: Hand  Result Value Ref Range Status   Specimen Description   Final    HAND RIGHT Performed at [facility], [address], [place], [place] [address]    Special Requests   Final    NONE Performed at [facility], [address] [place], [place] [address]    Gram Stain   Final    RARE WBC PRESENT, PREDOMINANTLY MONONUCLEAR FEW GRAM POSITIVE COCCI IN PAIRS    Culture   Final    FEW STAPHYLOCOCCUS LUGDUNENSIS WITHIN MIXED SKIN FLORA Performed at [facility], [address], [place], [place] [address]    Report Status 01/31/2023 FINAL  Final   Organism ID, Bacteria STAPHYLOCOCCUS  LUGDUNENSIS  Final      Susceptibility   Staphylococcus lugdunensis - MIC*    CIPROFLOXACIN <=0.5 SENSITIVE Sensitive     ERYTHROMYCIN <=0.25 SENSITIVE Sensitive     GENTAMICIN <=0.5 SENSITIVE Sensitive     OXACILLIN >=4 RESISTANT Resistant     TETRACYCLINE <=1 SENSITIVE Sensitive     VANCOMYCIN <=0.5 SENSITIVE Sensitive     TRIMETH/SULFA <=10 SENSITIVE Sensitive     CLINDAMYCIN <=0.25 SENSITIVE Sensitive     RIFAMPIN <=0.5 SENSITIVE Sensitive     Inducible Clindamycin NEGATIVE Sensitive     * FEW STAPHYLOCOCCUS LUGDUNENSIS    Labs: CBC: Recent Labs  Lab 01/25/23 1746 01/26/23 0549 01/27/23 0557 01/29/23 0412  WBC 17.0* 13.2* 9.0 9.6  NEUTROABS 13.9*  --   --   --   HGB 11.3* 10.4* 11.4* 11.1*  HCT 34.6* 31.1* 33.7* 32.6*  MCV 94.3 92.3 90.3 90.3  PLT 321 285 307 316   Basic Metabolic Panel: Recent Labs  Lab 01/25/23 1746 01/26/23 0549 01/27/23 0557 01/28/23 0551 01/29/23 0412  NA 137  137 139 139 139  K 5.8* 4.7 4.8 4.8 4.3  CL 104 108 109 109 109  CO2 22 22 21* 20* 21*  GLUCOSE 148* 98 84 82 85  BUN 74* 60* 52* 38* 32*  CREATININE 2.48* 2.12* 1.97* 1.74* 1.69*  CALCIUM 8.9 8.6* 8.8* 8.7* 8.8*  MG  --   --  1.9  --   --    Liver Function Tests: Recent Labs  Lab 01/25/23 1746 01/27/23 0557 01/28/23 0551  AST 46* 35 33  ALT 271* 162* 126*  ALKPHOS 91 80 81  BILITOT 0.8 0.7 0.8  PROT 6.5 5.8* 5.9*  ALBUMIN 3.1* 2.8* 2.9*   CBG: No results for input(s): "GLUCAP" in the last 168 hours.  Discharge time spent: greater than 30 minutes.  Signed: Pennie Banter, DO [facility] 01/31/2023

## 2023-01-29 NOTE — TOC Transition Note (Signed)
Transition of Care [facility]) - CM/SW Discharge Note   Patient Details  Name: George [name] MRN: [id number] Date of Birth: 10-24-1931  Transition of Care [facility] CM/SW Contact:  Bing Quarry, RN Phone Number: 01/29/2023, 1:20 PM   Clinical Narrative: 01/29/23: Patient discharging today after ortho provider was able to come early to change dressing/splint on Right Ring finger. Spoke with POA George [name] re discharge plan and RX needed for medications on discharge today. Start of service via [facility] will have RN to see patient Sunday, 01/30/23 per George [name]. Follow up instructions and appointments included in progress notes and relayed to George [name]. She will come to transport patient home in a few hours. Updated provider on pharmacy and transportation plan. Ambulation saturation test performed yesterday per provider and patient did not meet criteria for DME home oxygen. Ms George [name] verbally understood discharge plan. George Cirri RN CM    RX: Use [facility] [phone] [place], [place], [place] [address]. Final next level of care: Home w Home Health Services Barriers to Discharge: Barriers Resolved   Patient Goals and CMS Choice CMS Medicare.gov Compare Post Acute Care list provided to:: Patient Represenative (must comment) George [name] POA) Choice offered to / list presented to : Patient  Discharge Placement                         Discharge Plan and Services Additional resources added to the After Visit Summary for     Discharge Planning Services: CM Consult Post Acute Care Choice: Home Health          DME Arranged: Other see comment (transport chair) DME Agency: [facility] Date DME Agency Contacted: 01/28/23   Representative spoke with at DME Agency: George [name] HH Arranged: RN, PT, OT, Nurse's Aide HH Agency: [facility] Date HH Agency Contacted: 01/28/23   Representative spoke with at [facility] Agency: George [name]  Social Determinants of Health (SDOH)  Interventions SDOH Screenings   Food Insecurity: No Food Insecurity (01/26/2023)  Housing: Low Risk  (01/26/2023)  Transportation Needs: No Transportation Needs (01/26/2023)  Utilities: Not At Risk (01/26/2023)  Tobacco Use: High Risk (01/26/2023)     Readmission Risk Interventions    01/28/2023    1:51 PM  Readmission Risk Prevention Plan  Transportation Screening Complete  HRI or Home Care Consult Complete  Palliative Care Screening Complete  Medication Review (RN Care Manager) Complete

## 2023-01-29 NOTE — Progress Notes (Signed)
Mobility Specialist - Progress Note    01/29/23 1000  Mobility  Activity Ambulated with assistance in room;Stood at bedside;Dangled on edge of bed  Level of Assistance Contact guard assist, steadying assist  Assistive Device Front wheel walker  Distance Ambulated (ft) 10 ft  Range of Motion/Exercises Active  Activity Response Tolerated well  Mobility Referral Yes  $Mobility charge 1 Mobility   Author responding to bed alarm, pt OOB trying to go to bathroom. Pt ambulates to bathroom with RW CGA (MinA to stand). Pt returned to bed and left with needs in reach bed alarm activated.   Johnathan Hausen Mobility Specialist 01/29/23, 1:31 PM

## 2023-01-29 NOTE — Progress Notes (Signed)
Discharge instructions were reviewed with patient and Angelique Blonder (patient's POA). Questions were encourage and answered. Supplies for wound were provided. Belongings collected and IV was taken out.

## 2023-01-29 NOTE — Progress Notes (Signed)
Physical Therapy Treatment Patient Details Name: George [name] MRN: [id number] DOB: [birth year] Today's Date: 01/29/2023   History of Present Illness Pt is an 89 y/o male with PHM including shoulder surgery, CAD, COPD, CKD, DM2, respiratory failure. Admitted with cellulitis 2/2 recent fx of R little finger with splint applied at OSH around 4/9 and now with a new infected wound because of that rubbing on his ring finger. Has been seen by orthopedics with splint and wrapping provided.    PT Comments    Pt received in bed, required repeated vc's to orient to time, place, and situation. Discussed role of PT and possibility of returning home today or tomorrow with assistance. Pt continues to tolerate functional mobility and short distance gait training with RW. Limited distance due to fatigue level and unfamiliar environment. Pt appears to be close to baseline LOF and will most likely improve in familiar setting with HHPT once medically cleared for d/c home. Awaiting Right finger splint re-application since pt may have removed it last night. Pt to receive a lightweight transport chair prior to d/c.   Recommendations for follow up therapy are one component of a multi-disciplinary discharge planning process, led by the attending physician.  Recommendations may be updated based on patient status, additional functional criteria and insurance authorization.  Follow Up Recommendations       Assistance Recommended at Discharge Intermittent Supervision/Assistance  Patient can return home with the following A little help with walking and/or transfers;A little help with bathing/dressing/bathroom;Assistance with cooking/housework;Assist for transportation;Help with stairs or ramp for entrance   Equipment Recommendations  Wheelchair (measurements PT);Wheelchair cushion (measurements PT)    Recommendations for Other Services       Precautions / Restrictions Precautions Precautions: Fall;Other (comment)  (Very HOH) Precaution Comments: R little finger splint; R ring finger wrapped Required Braces or Orthoses: Splint/Cast Splint/Cast: R 5th finger splint Restrictions Weight Bearing Restrictions: No     Mobility  Bed Mobility Overal bed mobility: Modified Independent Bed Mobility: Supine to Sit, Sit to Supine     Supine to sit: Modified independent (Device/Increase time) Sit to supine: Modified independent (Device/Increase time)        Transfers Overall transfer level: Needs assistance Equipment used: Rolling walker (2 wheels) Transfers: Sit to/from Stand Sit to Stand: Min guard, From elevated surface           General transfer comment: Able to stand on second attempt    Ambulation/Gait Ambulation/Gait assistance: Supervision Gait Distance (Feet):  (25) Assistive device: Rolling walker (2 wheels) Gait Pattern/deviations: Step-to pattern, Trunk flexed, Wide base of support, Decreased step length - right, Decreased step length - left Gait velocity: decreased     General Gait Details:  (Pt quick to fatigue, DOE, HR 121bpm with SpO2 91% upon exertion.)   Stairs             Wheelchair Mobility    Modified Rankin (Stroke Patients Only)       Balance Overall balance assessment: Needs assistance Sitting-balance support: Feet supported Sitting balance-Leahy Scale: Good     Standing balance support: Bilateral upper extremity supported, During functional activity Standing balance-Leahy Scale: Fair                              Cognition Arousal/Alertness: Awake/alert Behavior During Therapy: WFL for tasks assessed/performed Overall Cognitive Status: History of cognitive impairments - at baseline  General Comments: Pt states that "my memory is about 5 minutes long".        Exercises General Exercises - Lower Extremity Ankle Circles/Pumps: AROM, Both, 10 reps Long Arc Quad: AROM, Both, 10  reps Hip Flexion/Marching: AROM, Both, 10 reps, Seated    General Comments General comments (skin integrity, edema, etc.):  (Pt removed R 5th finger splint last night awaiting Dr. Martha Clan to reapply)      Pertinent Vitals/Pain Pain Assessment Pain Assessment: No/denies pain    Home Living                          Prior Function            PT Goals (current goals can now be found in the care plan section) Acute Rehab PT Goals Patient Stated Goal: to get better Progress towards PT goals: Progressing toward goals    Frequency    Min 4X/week      PT Plan Current plan remains appropriate    Co-evaluation              AM-PAC PT "6 Clicks" Mobility   Outcome Measure  Help needed turning from your back to your side while in a flat bed without using bedrails?: A Little Help needed moving from lying on your back to sitting on the side of a flat bed without using bedrails?: A Little Help needed moving to and from a bed to a chair (including a wheelchair)?: A Little Help needed standing up from a chair using your arms (e.g., wheelchair or bedside chair)?: A Little Help needed to walk in hospital room?: A Little Help needed climbing 3-5 steps with a railing? : A Lot 6 Click Score: 17    End of Session Equipment Utilized During Treatment: Gait belt Activity Tolerance: Patient tolerated treatment well Patient left: in chair;with call bell/phone within reach;with chair alarm set;Other (comment) Nurse Communication: Mobility status PT Visit Diagnosis: Muscle weakness (generalized) (M62.81);Difficulty in walking, not elsewhere classified (R26.2)     Time: 1002-1030 PT Time Calculation (min) (ACUTE ONLY): 28 min  Charges:  $Gait Training: 8-22 mins $Therapeutic Exercise: 8-22 mins                    Zadie Cleverly, PTA   Jannet Askew 01/29/2023, 10:39 AM

## 2023-01-29 NOTE — Progress Notes (Signed)
Subjective:  Patient sleeping comfortably in bed.  Easily arousable.  No acute distress.  Splint and dressings on the right ring finger have been removed.    Objective:   VITALS:   Vitals:   01/28/23 1545 01/28/23 1955 01/29/23 0448 01/29/23 0807  BP: (!) 157/89 (!) 160/90 (!) 151/81 (!) 157/87  Pulse: (!) 105 (!) 107 89 97  Resp: Temp: 98.7 F (37.1 C) 98.2 F (36.8 C) 98.7 F (37.1 C) 98.2 F (36.8 C)  TempSrc:   Oral   SpO2: 98% 99% 98% 96%  Weight:   66.3 kg   Height:        PHYSICAL EXAM: Right small finger:  + swelling with limited ROM and resolving ecchymosis NVI.  Spint shortened and reapplied.  Right ring finger:  Volar distal wound healing.  No drainage.  No erythema.  Patient still says he can't feel the distal 1/2 of his finger.  ROM limited due to resolving swelling.    LABS  Results for orders placed or performed during the hospital encounter of 01/25/23 (from the past 24 hour(s))  Vancomycin, random     Status: None   Collection Time: 01/29/23  4:12 AM  Result Value Ref Range   Vancomycin Rm 14 ug/mL  CBC     Status: Abnormal   Collection Time: 01/29/23  4:12 AM  Result Value Ref Range   WBC 9.6 4.0 - 10.5 K/uL   RBC 3.61 (L) 4.22 - 5.81 MIL/uL   Hemoglobin 11.1 (L) 13.0 - 17.0 g/dL   HCT 40.9 (L) 81.1 - 91.4 %   MCV 90.3 80.0 - 100.0 fL   MCH 30.7 26.0 - 34.0 pg   MCHC 34.0 30.0 - 36.0 g/dL   RDW 78.2 95.6 - 21.3 %   Platelets 316 150 - 400 K/uL   nRBC 0.0 0.0 - 0.2 %  Basic metabolic panel     Status: Abnormal   Collection Time: 01/29/23  4:12 AM  Result Value Ref Range   Sodium 139 135 - 145 mmol/L   Potassium 4.3 3.5 - 5.1 mmol/L   Chloride 109 98 - 111 mmol/L   CO2 21 (L) 22 - 32 mmol/L   Glucose, Bld 85 70 - 99 mg/dL   BUN 32 (H) 8 - 23 mg/dL   Creatinine, Ser 0.86 (H) 0.61 - 1.24 mg/dL   Calcium 8.8 (L) 8.9 - 10.3 mg/dL   GFR, Estimated 38 (L) >60 mL/min   Anion gap 9 5 - 15    No results found.  Assessment/Plan:      Principal Problem:   Cellulitis of right ring finger secondary to infected ulcer Active Problems:   Essential hypertension   CAD (coronary artery disease)   COPD (chronic obstructive pulmonary disease)   Acute kidney injury superimposed on CKD IIIb (HCC)   Hyperkalemia   History of fracture of right fifth finger on 01/18/23  Patient will keep his splint in place and bandages on his ring finger until follow-up with me this coming week in clinic.  Ring finger dressings can be changed as needed.  Splint should not be removed.  She may follow-up at [facility] in [place] in 1 week for reevaluation.  He may return to the [facility] urgent care at [facility] [address] which is open 9 AM to 9 PM 7 days a week if he has any issues with his splint or dressing.    [name] , MD 01/29/2023, 12:42  PM

## 2023-01-30 LAB — CULTURE, BLOOD (SINGLE)
Culture: NO GROWTH
Special Requests: ADEQUATE

## 2023-01-31 LAB — AEROBIC CULTURE W GRAM STAIN (SUPERFICIAL SPECIMEN)

## 2023-02-14 ENCOUNTER — Encounter: Payer: Self-pay | Admitting: Intensive Care

## 2023-02-14 ENCOUNTER — Other Ambulatory Visit: Payer: Self-pay

## 2023-02-14 ENCOUNTER — Emergency Department: Payer: Medicare Other

## 2023-02-14 ENCOUNTER — Observation Stay
Admission: EM | Admit: 2023-02-14 | Discharge: 2023-02-16 | Disposition: A | Discharge status: Home Health | Payer: Medicare Other | Attending: Family Medicine | Admitting: Family Medicine

## 2023-02-14 DIAGNOSIS — E1129 Type 2 diabetes mellitus with other diabetic kidney complication: Secondary | ICD-10-CM | POA: Diagnosis present

## 2023-02-14 DIAGNOSIS — I2510 Atherosclerotic heart disease of native coronary artery without angina pectoris: Secondary | ICD-10-CM | POA: Diagnosis not present

## 2023-02-14 DIAGNOSIS — R079 Chest pain, unspecified: Secondary | ICD-10-CM

## 2023-02-14 DIAGNOSIS — I129 Hypertensive chronic kidney disease with stage 1 through stage 4 chronic kidney disease, or unspecified chronic kidney disease: Secondary | ICD-10-CM | POA: Diagnosis not present

## 2023-02-14 DIAGNOSIS — E1122 Type 2 diabetes mellitus with diabetic chronic kidney disease: Secondary | ICD-10-CM | POA: Insufficient documentation

## 2023-02-14 DIAGNOSIS — F17210 Nicotine dependence, cigarettes, uncomplicated: Secondary | ICD-10-CM | POA: Insufficient documentation

## 2023-02-14 DIAGNOSIS — J449 Chronic obstructive pulmonary disease, unspecified: Secondary | ICD-10-CM | POA: Insufficient documentation

## 2023-02-14 DIAGNOSIS — Z79899 Other long term (current) drug therapy: Secondary | ICD-10-CM | POA: Diagnosis not present

## 2023-02-14 DIAGNOSIS — Z720 Tobacco use: Secondary | ICD-10-CM | POA: Diagnosis present

## 2023-02-14 DIAGNOSIS — Z7952 Long term (current) use of systemic steroids: Secondary | ICD-10-CM | POA: Insufficient documentation

## 2023-02-14 DIAGNOSIS — R0789 Other chest pain: Secondary | ICD-10-CM | POA: Diagnosis present

## 2023-02-14 DIAGNOSIS — Z66 Do not resuscitate: Secondary | ICD-10-CM | POA: Diagnosis not present

## 2023-02-14 DIAGNOSIS — Z7982 Long term (current) use of aspirin: Secondary | ICD-10-CM | POA: Diagnosis not present

## 2023-02-14 DIAGNOSIS — N1832 Chronic kidney disease, stage 3b: Secondary | ICD-10-CM | POA: Insufficient documentation

## 2023-02-14 DIAGNOSIS — Z96612 Presence of left artificial shoulder joint: Secondary | ICD-10-CM | POA: Diagnosis not present

## 2023-02-14 DIAGNOSIS — I10 Essential (primary) hypertension: Secondary | ICD-10-CM | POA: Diagnosis present

## 2023-02-14 DIAGNOSIS — J189 Pneumonia, unspecified organism: Principal | ICD-10-CM | POA: Insufficient documentation

## 2023-02-14 DIAGNOSIS — F0390 Unspecified dementia without behavioral disturbance: Secondary | ICD-10-CM | POA: Diagnosis not present

## 2023-02-14 LAB — BASIC METABOLIC PANEL
Anion gap: 11 (ref 5–15)
BUN: 34 mg/dL — ABNORMAL HIGH (ref 8–23)
CO2: 24 mmol/L (ref 22–32)
Calcium: 8.7 mg/dL — ABNORMAL LOW (ref 8.9–10.3)
Chloride: 107 mmol/L (ref 98–111)
Creatinine, Ser: 1.67 mg/dL — ABNORMAL HIGH (ref 0.61–1.24)
GFR, Estimated: 39 mL/min — ABNORMAL LOW (ref >60)
Glucose, Bld: 111 mg/dL — ABNORMAL HIGH (ref 70–99)
Potassium: 4.3 mmol/L (ref 3.5–5.1)
Sodium: 142 mmol/L (ref 135–145)

## 2023-02-14 LAB — CBC
HCT: 33.1 % — ABNORMAL LOW (ref 39.0–52.0)
Hemoglobin: 10.8 g/dL — ABNORMAL LOW (ref 13.0–17.0)
MCH: 30.4 pg (ref 26.0–34.0)
MCHC: 32.6 g/dL (ref 30.0–36.0)
MCV: 93.2 fL (ref 80.0–100.0)
Platelets: 275 10*3/uL (ref 150–400)
RBC: 3.55 MIL/uL — ABNORMAL LOW (ref 4.22–5.81)
RDW: 14.1 % (ref 11.5–15.5)
WBC: 10.6 10*3/uL — ABNORMAL HIGH (ref 4.0–10.5)
nRBC: 0 % (ref 0.0–0.2)

## 2023-02-14 LAB — TROPONIN I (HIGH SENSITIVITY)
Troponin I (High Sensitivity): 7 ng/L (ref <18)
Troponin I (High Sensitivity): 7 ng/L (ref <18)

## 2023-02-14 MED ORDER — SODIUM CHLORIDE 0.9 % IV SOLN
2.0000 g | INTRAVENOUS | Status: DC
  Administered 2023-02-14: 2 g via INTRAVENOUS
  Filled 2023-02-14: qty 20

## 2023-02-14 MED ORDER — LACTATED RINGERS IV SOLN: INTRAVENOUS | Status: DC

## 2023-02-14 MED ORDER — IOHEXOL 350 MG/ML SOLN
75.0000 mL | Freq: Once | INTRAVENOUS | Status: AC | PRN
  Administered 2023-02-14: 75 mL via INTRAVENOUS

## 2023-02-14 MED ORDER — SODIUM CHLORIDE 0.9 % IV SOLN
500.0000 mg | INTRAVENOUS | Status: DC
  Administered 2023-02-14: 500 mg via INTRAVENOUS
  Filled 2023-02-14: qty 5

## 2023-02-14 NOTE — Assessment & Plan Note (Signed)
Stable

## 2023-02-14 NOTE — Assessment & Plan Note (Signed)
Continues to smoke. Caregiver has been trying to get pt to quit. Pt states when he stops breathing, he will quit smoking. Pt has no intention of quitting smoking.

## 2023-02-14 NOTE — Consult Note (Signed)
CODE SEPSIS - PHARMACY COMMUNICATION  **Broad Spectrum Antibiotics should be administered within 1 hour of Sepsis diagnosis**  Time Code Sepsis Called/Page Received: 1937  Antibiotics Ordered: Ceftriaxone, Azithromycin  Time of 1st antibiotic administration: 2008  Additional action taken by pharmacy: none  If necessary, Name of Provider/Nurse Contacted: n/a    Bettey Costa ,PharmD Clinical Pharmacist  02/14/2023  7:40 PM

## 2023-02-14 NOTE — H&P (Signed)
History and Physical    KOL HARDERS MWN:[id number] DOB: 1932-03-25 DOA: 02/14/2023  DOS: the patient was seen and examined on 02/14/2023  PCP: [name], MD   Patient coming from: Home  I have personally briefly reviewed patient's old medical records in [place] [facility]  CC: chest pain HPI: 89 year old male history of dementia, hypertension, COPD, chronic tobacco abuse, diabetes, reflux, coronary disease who presents to the ER today with chest pain.  Patient lives by himself but has care for several hours per day.  His caregiver Tonita Phoenix is also his healthcare power of attorney.  She states the patient complained of chest pain earlier today.  EMS was called.  Patient was not hypoxic.  Came to the ER.  Patient has dementia.  Cannot get a history review of systems from him.  Caregiver states the patient coughs a lot.  But this is not different than normal.  Patient continues to smoke.  He has not had a fever.  He does not use supplemental oxygen.  On arrival to the ER, temp 98.3 heart rate 67 blood pressure 121/51 satting 94% on room air.  White count 10.6, hemoglobin 10.8, platelets of 275  Sodium 142, potassium 4.3, BUN of 34, creatinine 1.67  Chest x-ray showed left upper lobe infiltrate.  CT scan shows consolidation of left upper lobe.  There is no pulmonary embolism.  Patient with multiple pulmonary nodules.  [facility] hospital[facility] contacted for admission.   ED Course: CTPA negative for PE. Showed LUL pneumonia. WBC 10  Review of Systems:  Review of Systems  Unable to perform ROS: Dementia    Past Medical History:  Diagnosis Date   Anemia    Arthritis    BPH (benign prostatic hyperplasia)    Cancer (HCC)    COLON   COPD (chronic obstructive pulmonary disease) (HCC)    Coronary artery disease    WITH 1 STENT   Dyspnea    WITH EXERTION ONLY   GERD (gastroesophageal reflux disease)    HOH (hard of hearing)    Hypercholesteremia    Hypertension     Pneumonia 06/2021   Sleep apnea    HAD UPPP    Past Surgical History:  Procedure Laterality Date   CARDIAC CATHETERIZATION     has a stent   COLON SURGERY     DUE TO COLON CANCER   COLONOSCOPY     MULTIPLE   EYE SURGERY Bilateral    CATARACT   HERNIA REPAIR     HOLEP-LASER ENUCLEATION OF THE PROSTATE WITH MORCELLATION N/A 08/21/2021   Procedure: HOLEP-LASER ENUCLEATION OF THE PROSTATE WITH MORCELLATION;  Surgeon: Sondra Come, MD;  Location: [facility] ORS;  Service: Urology;  Laterality: N/A;   REVERSE SHOULDER ARTHROPLASTY Left 02/10/2021   Procedure: REVERSE SHOULDER ARTHROPLASTY;  Surgeon: Christena Flake, MD;  Location: [facility] ORS;  Service: Orthopedics;  Laterality: Left;   TONSILLECTOMY     UVULOPALATOPHARYNGOPLASTY       reports that he has been smoking cigarettes. He has a 90.00 pack-year smoking history. He has never used smokeless tobacco. He reports that he does not currently use alcohol. He reports that he does not use drugs.  No Known Allergies  Family History  Problem Relation Age of Onset   Prostate cancer Neg Hx    Bladder Cancer Neg Hx    Kidney cancer Neg Hx     Prior to Admission medications   Medication Sig Start Date End Date Taking? Authorizing Provider  acidophilus (  RISAQUAD) CAPS capsule Take 2 capsules by mouth daily. 01/30/23   Esaw Grandchild A, DO  albuterol (PROVENTIL) (2.5 MG/3ML) 0.083% nebulizer solution Take 2.5 mg by nebulization every 6 (six) hours as needed for wheezing. 01/20/23 01/20/24  [provider]  albuterol (VENTOLIN HFA) 108 (90 Base) MCG/ACT inhaler Inhale 1-2 puffs into the lungs every 6 (six) hours as needed for wheezing or shortness of breath.    [provider]  aspirin EC 81 MG tablet Take 81 mg by mouth daily. Swallow whole.    [provider]  colchicine 0.6 MG tablet Take 0.5 tablets (0.3 mg total) by mouth daily. 06/17/21   Darlin Priestly, MD  cyanocobalamin (,VITAMIN B-12,) 1000 MCG/ML injection Inject 1,000  mcg into the muscle every 30 (thirty) days.    [provider]  Fluticasone-Umeclidin-Vilant (TRELEGY ELLIPTA) 100-62.5-25 MCG/INH AEPB Inhale 1 puff into the lungs every morning.    [provider]  HYDROcodone-acetaminophen (NORCO/VICODIN) 5-325 MG tablet Take 1-2 tablets by mouth every 4 (four) hours as needed for moderate pain. 01/29/23   Pennie Banter, DO  hyoscyamine (LEVSIN) 0.125 MG tablet Take 0.125 mg by mouth every 4 (four) hours as needed for cramping.    [provider]  omeprazole (PRILOSEC) 20 MG capsule Take 20 mg by mouth every morning.    [provider]  simvastatin (ZOCOR) 20 MG tablet Take 10 mg by mouth at bedtime.    [provider]  traMADol (ULTRAM) 50 MG tablet Take 50 mg by mouth every 8 (eight) hours as needed for moderate pain. 01/18/23   [provider]  verapamil (CALAN-SR) 240 MG CR tablet Take 240 mg by mouth 2 (two) times daily.    [provider]    Physical Exam: Vitals:   02/14/23 1614 02/14/23 1651 02/14/23 1900 02/14/23 2037  BP:  (!) 121/51 (!) 130/51   Pulse:  67 72   Resp:  17 14   Temp:  98.3 F (36.8 C)  98.1 F (36.7 C)  TempSrc:    Oral  SpO2:  94% 96%   Weight: 81.6 kg     Height: 5\' 11"  (1.803 m)       Physical Exam Vitals and nursing note reviewed.  Constitutional:      General: He is not in acute distress.    Appearance: He is not ill-appearing, toxic-appearing or diaphoretic.     Comments: Being fed dinner by HCPOA. Eating well. No choking.  HENT:     Head: Normocephalic and atraumatic.     Nose: Nose normal.  Eyes:     General: No scleral icterus. Cardiovascular:     Rate and Rhythm: Normal rate and regular rhythm.     Pulses: Normal pulses.  Pulmonary:     Effort: No respiratory distress.     Breath sounds: No wheezing.     Comments: Wet cough Abdominal:     General: Bowel sounds are normal. There is no distension.     Palpations: Abdomen is soft.      Tenderness: There is no abdominal tenderness.  Musculoskeletal:     Comments: Trace ankle edema bilaterally  Skin:    General: Skin is warm and dry.     Capillary Refill: Capillary refill takes less than 2 seconds.  Neurological:     General: No focal deficit present.     Mental Status: He is alert. He is disoriented.      Labs on Admission: I have personally reviewed following  labs and imaging studies  CBC: Recent Labs  Lab 02/14/23 1619  WBC 10.6*  HGB 10.8*  HCT 33.1*  MCV 93.2  PLT 275   Basic Metabolic Panel: Recent Labs  Lab 02/14/23 1619  NA 142  K 4.3  CL 107  CO2 24  GLUCOSE 111*  BUN 34*  CREATININE 1.67*  CALCIUM 8.7*   GFR: Estimated Creatinine Clearance: 31.9 mL/min (A) (by C-G formula based on SCr of 1.67 mg/dL (H)).  Cardiac Enzymes: Recent Labs  Lab 02/14/23 1619 02/14/23 1834  TROPONINIHS 7 7   Radiological Exams on Admission: I have personally reviewed images CT Angio Chest PE W/Cm &/Or Wo Cm  Result Date: 02/14/2023 CLINICAL DATA:  Pulmonary embolism (PE) suspected, high prob Chest pain. EXAM: CT ANGIOGRAPHY CHEST WITH CONTRAST TECHNIQUE: Multidetector CT imaging of the chest was performed using the standard protocol during bolus administration of intravenous contrast. Multiplanar CT image reconstructions and MIPs were obtained to evaluate the vascular anatomy. RADIATION DOSE REDUCTION: This exam was performed according to the departmental dose-optimization program which includes automated exposure control, adjustment of the mA and/or kV according to patient size and/or use of iterative reconstruction technique. CONTRAST:  75mL OMNIPAQUE IOHEXOL 350 MG/ML SOLN COMPARISON:  Radiograph earlier today., most recent CT 06/27/2019 FINDINGS: Cardiovascular: There are no filling defects within the pulmonary arteries to suggest pulmonary embolus. Aortic atherosclerosis. No aneurysm or acute aortic findings. The heart is normal in size. There are coronary  artery calcifications. Small pericardial effusion appears chronic. Mediastinum/Nodes: Mildly enlarged left hilar nodes measuring up to 11 mm, series 4, image 70. 10 mm right hilar node, series 4, image 65. There is an 11 mm anterior lower paratracheal node series 4, image 59. This is similar to prior exam. There additional small and borderline mediastinal nodes. No esophageal wall thickening. Lungs/Pleura: Advanced emphysema. Irregularly-shaped left upper lobe airspace consolidation, series 5, image 45. This is contiguous with a spiculated nodular density anteriorly in the left upper lobe measuring 12 mm, series 5, image 53. Anterior to this spiculated nodule is a small subpleural densities series 5, image 59. Peripheral linear opacities extend from the dominant opacity towards the apex with ill-defined subpleural opacity series 5, image 21. There is a spiculated 6 mm right apical pulmonary nodule, series 5 image 16. 10 mm irregular nodule in the right lower lobe series 5, image 74. Additional smaller nodules throughout both lungs. Moderate bronchial thickening with occasional areas of mucoid impaction. Retained mucus within the dependent trachea and both mainstem bronchi. No significant pleural effusion. Upper Abdomen: Gallstones. No acute upper abdominal findings. Musculoskeletal: The bones are subjectively under mineralized. Left shoulder arthroplasty. There are no acute or suspicious osseous abnormalities. Review of the MIP images confirms the above findings. IMPRESSION: 1. No pulmonary embolus. 2. Multifocal irregular nodular airspace disease and pulmonary nodules within both lungs. Dominant consolidation in the left upper lobe. Recommend close radiographic and CT follow-up to ensure resolution and exclude pulmonary neoplasm. CT follow-up in 3-4 weeks would be reasonable. 3. Moderate to advanced emphysema. Bronchial thickening. Retained mucus within the dependent trachea and both mainstem bronchi. 4. Incidental  note of gallstones in the upper abdomen. Aortic Atherosclerosis (ICD10-I70.0) and Emphysema (ICD10-J43.9). Electronically Signed   By: Narda Rutherford M.D.   On: 02/14/2023 20:58   DG Chest 2 View  Result Date: 02/14/2023 CLINICAL DATA:  Chest pain EXAM: CHEST - 2 VIEW COMPARISON:  01/25/2023 FINDINGS: Normal heart size, mediastinal contours, and pulmonary vascularity. Atherosclerotic calcification aorta. Emphysematous and  bronchitic changes consistent with COPD. New RIGHT upper lobe infiltrate. Slight accentuation of interstitial markings at mid to lower lung stable. No pleural effusion or pneumothorax. Bones demineralized with note of a LEFT shoulder prosthesis. IMPRESSION: COPD changes with LEFT upper lobe infiltrate consistent with pneumonia new since prior exam. Electronically Signed   By: Ulyses Southward M.D.   On: 02/14/2023 17:42    EKG: My personal interpretation of EKG shows: NSR    Assessment/Plan Principal Problem:   CAP (community acquired pneumonia) Active Problems:   Tobacco abuse   Essential hypertension   COPD (chronic obstructive pulmonary disease) (HCC)   Type II diabetes mellitus with renal manifestations (HCC)   CKD stage 3b, GFR 30-44 ml/min (HCC)   DNR (do not resuscitate)    Assessment and Plan: * CAP (community acquired pneumonia) Observation med/surg. RA sats 96%. WBC 10. No fevers. Not hypoxic. Spoke with pt's caregiver and HCPOA Tonita Phoenix. She would like patient to be observed overnight. That is reasonable. Pt does have infiltrated in LUL on CTPA. No PE. IV rocephin/zithromax. Discussed that pt could be discharged tomorrow if stable overnight. HCPOA is agreeable to that plan.  DNR (do not resuscitate) Discussed HCPOA. She wants pt to remains DNR/DNI.  CKD stage 3b, GFR 30-44 ml/min (HCC) Stable.  Type II diabetes mellitus with renal manifestations (HCC) Stable.  COPD (chronic obstructive pulmonary disease) (HCC) Stable. No exacerbated. Continue  inhalers.  Essential hypertension Stable.  Tobacco abuse Continues to smoke. Caregiver has been trying to get pt to quit. Pt states when he stops breathing, he will quit smoking. Pt has no intention of quitting smoking.   DVT prophylaxis: SQ Heparin Code Status: DNR/DNI(Do NOT Intubate). Verified with HCPOA Tonita Phoenix Family Communication: discussed with HCPOA at bedside  Disposition Plan: return home  Consults called: none  Admission status: Observation, Med-Surg   Carollee Herter, DO [facility] 02/14/2023, 9:25 PM

## 2023-02-14 NOTE — Assessment & Plan Note (Signed)
Stable. No exacerbated. Continue inhalers.

## 2023-02-14 NOTE — ED Provider Notes (Signed)
[facility] Provider Note   Event Date/Time   First MD Initiated Contact with Patient 02/14/23 1800     (approximate) History  Chest Pain  HPI George [name] is a 89 y.o. male with a past medical history of dementia who presents complaining of chest pain.  Patient is a poor historian secondary to his dementia however speaking with patient's medical power of attorney stating that he has been complaining of worsening chest pain over the last 2 days is worse with deep inspiration.  Patient is a current smoker as well. ROS: Patient currently denies any vision changes, tinnitus, difficulty speaking, facial droop, sore throat, abdominal pain, nausea/vomiting/diarrhea, dysuria, or weakness/numbness/paresthesias in any extremity   Physical Exam  Triage Vital Signs: ED Triage Vitals  Enc Vitals Group     BP 02/14/23 1651 (!) 121/51     Pulse Rate 02/14/23 1651 67     Resp 02/14/23 1651 17     Temp 02/14/23 1651 98.3 F (36.8 C)     Temp src --      SpO2 02/14/23 1651 94 %     Weight 02/14/23 1614 180 lb (81.6 kg)     Height 02/14/23 1614 5\' 11"  (1.803 m)     Head Circumference --      Peak Flow --      Pain Score 02/14/23 1614 0     Pain Loc --      Pain Edu? --      Excl. in GC? --    Most recent vital signs: Vitals:   02/14/23 1900 02/14/23 2037  BP: (!) 130/51   Pulse: 72   Resp: 14   Temp:  98.1 F (36.7 C)  SpO2: 96%    General: Awake, oriented x4. CV:  Good peripheral perfusion.  Resp:  Occasional increased effort.  Abd:  No distention.  Other:  Elderly overweight Caucasian male laying in bed in mild respiratory distress ED Results / Procedures / Treatments  Labs (all labs ordered are listed, but only abnormal results are displayed) Labs Reviewed  BASIC METABOLIC PANEL - Abnormal; Notable for the following components:      Result Value   Glucose, Bld 111 (*)    BUN 34 (*)    Creatinine, Ser 1.67 (*)    Calcium 8.7 (*)    GFR, Estimated  39 (*)    All other components within normal limits  CBC - Abnormal; Notable for the following components:   WBC 10.6 (*)    RBC 3.55 (*)    Hemoglobin 10.8 (*)    HCT 33.1 (*)    All other components within normal limits  TROPONIN I (HIGH SENSITIVITY)  TROPONIN I (HIGH SENSITIVITY)   EKG ED ECG REPORT I, Merwyn Katos, the attending physician, personally viewed and interpreted this ECG. Date: 02/14/2023 EKG Time: 1616 Rate: 67 Rhythm: normal sinus rhythm QRS Axis: normal Intervals: normal ST/T Wave abnormalities: normal Narrative Interpretation: no evidence of acute ischemia RADIOLOGY ED MD interpretation: 2 view chest x-ray interpreted independently by me and shows COPD changes with left upper lobe infiltrate consistent with pneumonia since prior exam -Agree with radiology assessment Official radiology report(s): CT Angio Chest PE W/Cm &/Or Wo Cm  Result Date: 02/14/2023 CLINICAL DATA:  Pulmonary embolism (PE) suspected, high prob Chest pain. EXAM: CT ANGIOGRAPHY CHEST WITH CONTRAST TECHNIQUE: Multidetector CT imaging of the chest was performed using the standard protocol during bolus administration of intravenous contrast. Multiplanar CT image reconstructions  and MIPs were obtained to evaluate the vascular anatomy. RADIATION DOSE REDUCTION: This exam was performed according to the departmental dose-optimization program which includes automated exposure control, adjustment of the mA and/or kV according to patient size and/or use of iterative reconstruction technique. CONTRAST:  75mL OMNIPAQUE IOHEXOL 350 MG/ML SOLN COMPARISON:  Radiograph earlier today., most recent CT 06/27/2019 FINDINGS: Cardiovascular: There are no filling defects within the pulmonary arteries to suggest pulmonary embolus. Aortic atherosclerosis. No aneurysm or acute aortic findings. The heart is normal in size. There are coronary artery calcifications. Small pericardial effusion appears chronic. Mediastinum/Nodes:  Mildly enlarged left hilar nodes measuring up to 11 mm, series 4, image 70. 10 mm right hilar node, series 4, image 65. There is an 11 mm anterior lower paratracheal node series 4, image 59. This is similar to prior exam. There additional small and borderline mediastinal nodes. No esophageal wall thickening. Lungs/Pleura: Advanced emphysema. Irregularly-shaped left upper lobe airspace consolidation, series 5, image 45. This is contiguous with a spiculated nodular density anteriorly in the left upper lobe measuring 12 mm, series 5, image 53. Anterior to this spiculated nodule is a small subpleural densities series 5, image 59. Peripheral linear opacities extend from the dominant opacity towards the apex with ill-defined subpleural opacity series 5, image 21. There is a spiculated 6 mm right apical pulmonary nodule, series 5 image 16. 10 mm irregular nodule in the right lower lobe series 5, image 74. Additional smaller nodules throughout both lungs. Moderate bronchial thickening with occasional areas of mucoid impaction. Retained mucus within the dependent trachea and both mainstem bronchi. No significant pleural effusion. Upper Abdomen: Gallstones. No acute upper abdominal findings. Musculoskeletal: The bones are subjectively under mineralized. Left shoulder arthroplasty. There are no acute or suspicious osseous abnormalities. Review of the MIP images confirms the above findings. IMPRESSION: 1. No pulmonary embolus. 2. Multifocal irregular nodular airspace disease and pulmonary nodules within both lungs. Dominant consolidation in the left upper lobe. Recommend close radiographic and CT follow-up to ensure resolution and exclude pulmonary neoplasm. CT follow-up in 3-4 weeks would be reasonable. 3. Moderate to advanced emphysema. Bronchial thickening. Retained mucus within the dependent trachea and both mainstem bronchi. 4. Incidental note of gallstones in the upper abdomen. Aortic Atherosclerosis (ICD10-I70.0) and  Emphysema (ICD10-J43.9). Electronically Signed   By: Narda Rutherford M.D.   On: 02/14/2023 20:58   DG Chest 2 View  Result Date: 02/14/2023 CLINICAL DATA:  Chest pain EXAM: CHEST - 2 VIEW COMPARISON:  01/25/2023 FINDINGS: Normal heart size, mediastinal contours, and pulmonary vascularity. Atherosclerotic calcification aorta. Emphysematous and bronchitic changes consistent with COPD. New RIGHT upper lobe infiltrate. Slight accentuation of interstitial markings at mid to lower lung stable. No pleural effusion or pneumothorax. Bones demineralized with note of a LEFT shoulder prosthesis. IMPRESSION: COPD changes with LEFT upper lobe infiltrate consistent with pneumonia new since prior exam. Electronically Signed   By: Ulyses Southward M.D.   On: 02/14/2023 17:42   PROCEDURES: Critical Care performed: Yes, see critical care procedure note(s) .1-3 Lead EKG Interpretation  Performed by: Merwyn Katos, MD Authorized by: Merwyn Katos, MD     Interpretation: normal     ECG rate:  71   ECG rate assessment: normal     Rhythm: sinus rhythm     Ectopy: none     Conduction: normal   CRITICAL CARE Performed by: Merwyn Katos  Total critical care time: 31 minutes  Critical care time was exclusive of separately billable procedures and  treating other patients.  Critical care was necessary to treat or prevent imminent or life-threatening deterioration.  Critical care was time spent personally by me on the following activities: development of treatment plan with patient and/or surrogate as well as nursing, discussions with consultants, evaluation of patient's response to treatment, examination of patient, obtaining history from patient or surrogate, ordering and performing treatments and interventions, ordering and review of laboratory studies, ordering and review of radiographic studies, pulse oximetry and re-evaluation of patient's condition.  MEDICATIONS ORDERED IN ED: Medications  cefTRIAXone  (ROCEPHIN) 2 g in sodium chloride 0.9 % 100 mL IVPB (0 g Intravenous Stopped 02/14/23 2026)  azithromycin (ZITHROMAX) 500 mg in sodium chloride 0.9 % 250 mL IVPB (500 mg Intravenous New Bag/Given 02/14/23 2008)  iohexol (OMNIPAQUE) 350 MG/ML injection 75 mL (75 mLs Intravenous Contrast Given 02/14/23 2029)   IMPRESSION / MDM / ASSESSMENT AND PLAN / ED COURSE  I reviewed the triage vital signs and the nursing notes.                             The patient is on the cardiac monitor to evaluate for evidence of arrhythmia and/or significant heart rate changes. Patient's presentation is most consistent with acute presentation with potential threat to life or bodily function. Presents with chest pain and cough concerning for pneumonia.  DDx: PE, COPD exacerbation, Pneumothorax, TB, Atypical ACS, Esophageal Rupture, Toxic Exposure, Foreign Body Airway Obstruction.  Workup: CXR CBC, CMP, lactate, troponin  Given History, Exam, and Workup presentation most consistent with pneumonia.  Findings: Left upper lobe pneumonia  Tx: Ceftriaxone 1g IV Azithromycin 500mg  IV  2050 Reassessment: Given PSI/PORT score of 109 (risk class IV), patient will require admission to the internal medicine service for further evaluation and management  Disposition: Admit   FINAL CLINICAL IMPRESSION(S) / ED DIAGNOSES   Final diagnoses:  Chest pain, unspecified type  Community acquired pneumonia of left upper lobe of lung   Rx / DC Orders   ED Discharge Orders     None      Note:  This document was prepared using Dragon voice recognition software and may include unintentional dictation errors.   Merwyn Katos, MD 02/14/23 2121

## 2023-02-14 NOTE — Assessment & Plan Note (Signed)
Discussed HCPOA. She wants pt to remains DNR/DNI.

## 2023-02-14 NOTE — ED Notes (Signed)
Patient transported to CT 

## 2023-02-14 NOTE — Subjective & Objective (Addendum)
CC: chest pain HPI: 89 year old male history of dementia, hypertension, COPD, chronic tobacco abuse, diabetes, reflux, coronary disease who presents to the ER today with chest pain.  Patient lives by himself but has care for several hours per day.  His caregiver Tonita Phoenix is also his healthcare power of attorney.  She states the patient complained of chest pain earlier today.  EMS was called.  Patient was not hypoxic.  Came to the ER.  Patient has dementia.  Cannot get a history review of systems from him.  Caregiver states the patient coughs a lot.  But this is not different than normal.  Patient continues to smoke.  He has not had a fever.  He does not use supplemental oxygen.  On arrival to the ER, temp 98.3 heart rate 67 blood pressure 121/51 satting 94% on room air.  White count 10.6, hemoglobin 10.8, platelets of 275  Sodium 142, potassium 4.3, BUN of 34, creatinine 1.67  Chest x-ray showed left upper lobe infiltrate.  CT scan shows consolidation of left upper lobe.  There is no pulmonary embolism.  Patient with multiple pulmonary nodules.  [facility] hospitalist contacted for admission.

## 2023-02-14 NOTE — ED Triage Notes (Addendum)
Patient arrived by EMS from home with c/o chest pain. Reports pain worse with breathing deeply. Ambulatory with assistance at baseline. Patient also has bilateral feet swelling. Home health nurse called EMS  History dementia and current smoker  Patient took 324 aspirin before EMS arrival  EMS vitals: 119/59b/p 67HR 13RR 96% RA

## 2023-02-14 NOTE — Assessment & Plan Note (Addendum)
Observation med/surg. RA sats 96%. WBC 10. No fevers. Not hypoxic. Spoke with pt's caregiver and HCPOA Tonita Phoenix. She would like patient to be observed overnight. That is reasonable. Pt does have infiltrated in LUL on CTPA. No PE. IV rocephin/zithromax. Discussed that pt could be discharged tomorrow if stable overnight. HCPOA is agreeable to that plan.

## 2023-02-14 NOTE — ED Notes (Signed)
Patient returned from CT

## 2023-02-15 ENCOUNTER — Encounter: Payer: Self-pay | Admitting: Internal Medicine

## 2023-02-15 DIAGNOSIS — J189 Pneumonia, unspecified organism: Secondary | ICD-10-CM | POA: Diagnosis not present

## 2023-02-15 LAB — COMPREHENSIVE METABOLIC PANEL
ALT: 12 U/L (ref 0–44)
AST: 19 U/L (ref 15–41)
Albumin: 2.9 g/dL — ABNORMAL LOW (ref 3.5–5.0)
Alkaline Phosphatase: 86 U/L (ref 38–126)
Anion gap: 8 (ref 5–15)
BUN: 32 mg/dL — ABNORMAL HIGH (ref 8–23)
CO2: 22 mmol/L (ref 22–32)
Calcium: 8.6 mg/dL — ABNORMAL LOW (ref 8.9–10.3)
Chloride: 108 mmol/L (ref 98–111)
Creatinine, Ser: 1.56 mg/dL — ABNORMAL HIGH (ref 0.61–1.24)
GFR, Estimated: 42 mL/min — ABNORMAL LOW (ref >60)
Glucose, Bld: 86 mg/dL (ref 70–99)
Potassium: 4.2 mmol/L (ref 3.5–5.1)
Sodium: 138 mmol/L (ref 135–145)
Total Bilirubin: 0.4 mg/dL (ref 0.3–1.2)
Total Protein: 5.6 g/dL — ABNORMAL LOW (ref 6.5–8.1)

## 2023-02-15 LAB — CBC WITH DIFFERENTIAL/PLATELET
Abs Immature Granulocytes: 0.04 10*3/uL (ref 0.00–0.07)
Basophils Absolute: 0.1 10*3/uL (ref 0.0–0.1)
Basophils Relative: 1 %
Eosinophils Absolute: 0.9 10*3/uL — ABNORMAL HIGH (ref 0.0–0.5)
Eosinophils Relative: 7 %
HCT: 31.7 % — ABNORMAL LOW (ref 39.0–52.0)
Hemoglobin: 10.7 g/dL — ABNORMAL LOW (ref 13.0–17.0)
Immature Granulocytes: 0 %
Lymphocytes Relative: 14 %
Lymphs Abs: 1.7 10*3/uL (ref 0.7–4.0)
MCH: 30.7 pg (ref 26.0–34.0)
MCHC: 33.8 g/dL (ref 30.0–36.0)
MCV: 91.1 fL (ref 80.0–100.0)
Monocytes Absolute: 1.1 10*3/uL — ABNORMAL HIGH (ref 0.1–1.0)
Monocytes Relative: 9 %
Neutro Abs: 8.6 10*3/uL — ABNORMAL HIGH (ref 1.7–7.7)
Neutrophils Relative %: 69 %
Platelets: 286 10*3/uL (ref 150–400)
RBC: 3.48 MIL/uL — ABNORMAL LOW (ref 4.22–5.81)
RDW: 13.9 % (ref 11.5–15.5)
WBC: 12.4 10*3/uL — ABNORMAL HIGH (ref 4.0–10.5)
nRBC: 0 % (ref 0.0–0.2)

## 2023-02-15 LAB — HEMOGLOBIN A1C
Hgb A1c MFr Bld: 5.8 % — ABNORMAL HIGH (ref 4.8–5.6)
Mean Plasma Glucose: 119.76 mg/dL

## 2023-02-15 LAB — GLUCOSE, CAPILLARY
Glucose-Capillary: 115 mg/dL — ABNORMAL HIGH (ref 70–99)
Glucose-Capillary: 143 mg/dL — ABNORMAL HIGH (ref 70–99)
Glucose-Capillary: 74 mg/dL (ref 70–99)
Glucose-Capillary: 81 mg/dL (ref 70–99)
Glucose-Capillary: 90 mg/dL (ref 70–99)
Glucose-Capillary: 90 mg/dL (ref 70–99)

## 2023-02-15 MED ORDER — AZITHROMYCIN 500 MG PO TABS
500.0000 mg | ORAL_TABLET | Freq: Every day | ORAL | Status: DC
  Administered 2023-02-15: 500 mg via ORAL
  Filled 2023-02-15 (×2): qty 1

## 2023-02-15 MED ORDER — INSULIN ASPART 100 UNIT/ML IJ SOLN: 0.0000 [IU] | Freq: Every day | INTRAMUSCULAR | Status: DC

## 2023-02-15 MED ORDER — FLUTICASONE FUROATE-VILANTEROL 100-25 MCG/ACT IN AEPB
1.0000 | INHALATION_SPRAY | Freq: Every day | RESPIRATORY_TRACT | Status: DC
  Administered 2023-02-15 – 2023-02-16 (×2): 1 via RESPIRATORY_TRACT
  Filled 2023-02-15: qty 28

## 2023-02-15 MED ORDER — SIMVASTATIN 20 MG PO TABS
10.0000 mg | ORAL_TABLET | Freq: Every day | ORAL | Status: DC
  Administered 2023-02-15 (×2): 10 mg via ORAL
  Filled 2023-02-15 (×2): qty 1

## 2023-02-15 MED ORDER — ASPIRIN 81 MG PO TBEC
81.0000 mg | DELAYED_RELEASE_TABLET | Freq: Every day | ORAL | Status: DC
  Administered 2023-02-15 – 2023-02-16 (×2): 81 mg via ORAL
  Filled 2023-02-15 (×2): qty 1

## 2023-02-15 MED ORDER — ALBUTEROL SULFATE (2.5 MG/3ML) 0.083% IN NEBU: 2.5000 mg | INHALATION_SOLUTION | Freq: Four times a day (QID) | RESPIRATORY_TRACT | Status: DC | PRN

## 2023-02-15 MED ORDER — NICOTINE 21 MG/24HR TD PT24
21.0000 mg | MEDICATED_PATCH | Freq: Every day | TRANSDERMAL | Status: DC
  Administered 2023-02-15: 21 mg via TRANSDERMAL
  Filled 2023-02-15: qty 1

## 2023-02-15 MED ORDER — UMECLIDINIUM BROMIDE 62.5 MCG/ACT IN AEPB
1.0000 | INHALATION_SPRAY | Freq: Every day | RESPIRATORY_TRACT | Status: DC
  Administered 2023-02-15: 1 via RESPIRATORY_TRACT
  Filled 2023-02-15: qty 7

## 2023-02-15 MED ORDER — HEPARIN SODIUM (PORCINE) 5000 UNIT/ML IJ SOLN
5000.0000 [IU] | Freq: Three times a day (TID) | INTRAMUSCULAR | Status: DC
  Administered 2023-02-15 – 2023-02-16 (×5): 5000 [IU] via SUBCUTANEOUS
  Filled 2023-02-15 (×6): qty 1

## 2023-02-15 MED ORDER — ONDANSETRON HCL 4 MG/2ML IJ SOLN: 4.0000 mg | Freq: Four times a day (QID) | INTRAMUSCULAR | Status: DC | PRN

## 2023-02-15 MED ORDER — ACETAMINOPHEN 650 MG RE SUPP: 650.0000 mg | Freq: Four times a day (QID) | RECTAL | Status: DC | PRN

## 2023-02-15 MED ORDER — ACETAMINOPHEN 325 MG PO TABS: 650.0000 mg | ORAL_TABLET | Freq: Four times a day (QID) | ORAL | Status: DC | PRN

## 2023-02-15 MED ORDER — INSULIN ASPART 100 UNIT/ML IJ SOLN
0.0000 [IU] | Freq: Three times a day (TID) | INTRAMUSCULAR | Status: DC
  Administered 2023-02-15: 1 [IU] via SUBCUTANEOUS
  Filled 2023-02-15: qty 1

## 2023-02-15 MED ORDER — ONDANSETRON HCL 4 MG PO TABS: 4.0000 mg | ORAL_TABLET | Freq: Four times a day (QID) | ORAL | Status: DC | PRN

## 2023-02-15 MED ORDER — SODIUM CHLORIDE 0.9 % IV SOLN
2.0000 g | INTRAVENOUS | Status: DC
  Administered 2023-02-15: 2 g via INTRAVENOUS
  Filled 2023-02-15: qty 20

## 2023-02-15 MED ORDER — VERAPAMIL HCL ER 240 MG PO TBCR
240.0000 mg | EXTENDED_RELEASE_TABLET | Freq: Two times a day (BID) | ORAL | Status: DC
  Administered 2023-02-15 – 2023-02-16 (×4): 240 mg via ORAL
  Filled 2023-02-15 (×5): qty 1

## 2023-02-15 MED ORDER — PANTOPRAZOLE SODIUM 40 MG PO TBEC
40.0000 mg | DELAYED_RELEASE_TABLET | Freq: Every day | ORAL | Status: DC
  Administered 2023-02-15 – 2023-02-16 (×2): 40 mg via ORAL
  Filled 2023-02-15 (×2): qty 1

## 2023-02-15 NOTE — Evaluation (Signed)
Occupational Therapy Evaluation Patient Details Name: George [name] MRN: [id number] DOB: 1932/02/05 Today's Date: 02/15/2023   History of Present Illness 89 years old Male with PMH significant for dementia, hypertension, COPD, chronic tobacco abuse, diabetes, GERD, coronary artery disease presented in the ED with C/O: chest pain.  Patient lives by self and has care for several hours per day.  Patient's caregiver Tonita Phoenix who is healthcare power of attorney,  She states patient started complaining about chest pain earlier today.  EMS was called.  Patient was not hypoxic.  He was brought in the ED for further evaluation.  Caregiver states that patient coughs a lot but that is not different than his usual.  Patient continues to smoke.  Never had any fever at home.  He was hemodynamically stable on arrival in the ED.  Chest x-ray shows left upper lobe infiltrate.  CT scan ruled out pulmonary embolism but shows consolidation of left upper lobe.  Patient is started on IV antibiotics and admitted for further evaluation.   Clinical Impression   Patient presenting with decreased Ind in self care,balance, functional mobility/transfers, endurance, and safety awareness. Patient is oriented to self only. He is very hard of hearing but caregiver dropped off hearing aides this afternoon. Per chart report, pt lives at home alone with caregiver providing assistance 7am-7pm. Pt needing CGA with RW, assist with ADLs, and total A with IADL tasks at baseline. Patient sitting in recliner chair and eating food from lunch tray with fingers. Pt stands with  min assist and ambulates with min HHA 20' in room. He declines toileting. Once returned to chair, he says, "What is wrong with me?" Pt reports feeling SOB but HR is in 90's and O2 saturation while on RA is 94%. Pt seated with chair alarm activated and call bell within reach. Patient will benefit from acute OT to increase overall independence in the areas of ADLs,  functional mobility, and safety awareness in order to safely discharge.     Recommendations for follow up therapy are one component of a multi-disciplinary discharge planning process, led by the attending physician.  Recommendations may be updated based on patient status, additional functional criteria and insurance authorization.   Assistance Recommended at Discharge Frequent or constant Supervision/Assistance  Patient can return home with the following A little help with walking and/or transfers;A little help with bathing/dressing/bathroom;Assistance with cooking/housework;Direct supervision/assist for medications management;Direct supervision/assist for financial management;Assist for transportation;Help with stairs or ramp for entrance    Functional Status Assessment  Patient has had a recent decline in their functional status and demonstrates the ability to make significant improvements in function in a reasonable and predictable amount of time.  Equipment Recommendations  None recommended by OT       Precautions / Restrictions Precautions Precautions: Fall;Other (comment) Required Braces or Orthoses: Splint/Cast Splint/Cast: R 5th finger splint      Mobility Bed Mobility               General bed mobility comments: seated in recliner chair    Transfers Overall transfer level: Needs assistance Equipment used: Rolling walker (2 wheels) Transfers: Sit to/from Stand, Bed to chair/wheelchair/BSC Sit to Stand: Min guard, Min assist     Step pivot transfers: Min assist            Balance Overall balance assessment: Needs assistance Sitting-balance support: Feet supported Sitting balance-Leahy Scale: Good     Standing balance support: Bilateral upper extremity supported, During functional activity Standing balance-Leahy Scale:  Fair                             ADL either performed or assessed with clinical judgement   ADL Overall ADL's : Needs  assistance/impaired Eating/Feeding: Set up;Supervision/ safety;Sitting Eating/Feeding Details (indicate cue type and reason): Pt doing much better with finger foods on tray                     Toilet Transfer: Minimal assistance Toilet Transfer Details (indicate cue type and reason): simulated                 Vision Patient Visual Report: No change from baseline              Pertinent Vitals/Pain Pain Assessment Pain Assessment: Faces Faces Pain Scale: No hurt     Hand Dominance Right   Extremity/Trunk Assessment Upper Extremity Assessment Upper Extremity Assessment: RUE deficits/detail RUE Deficits / Details: R 5th digit in splint           Communication Communication Communication: HOH   Cognition Arousal/Alertness: Awake/alert Behavior During Therapy: WFL for tasks assessed/performed Overall Cognitive Status: History of cognitive impairments - at baseline                                 General Comments: Pt states, " I can't remember what happened 5 minutes ago"                Home Living Family/patient expects to be discharged to:: Private residence Living Arrangements: Non-relatives/Friends Available Help at Discharge: Other (Comment) (caregiver) Type of Home: House Home Access: Stairs to enter Entrance Stairs-Number of Steps: 4   Home Layout: Two level;Able to live on main level with bedroom/bathroom               Home Equipment: Cane - single point;Rolling Walker (2 wheels);BSC/3in1          Prior Functioning/Environment Prior Level of Function : Patient poor historian/Family not available;Other (comment)               ADLs Comments: Caregiver assists with ADLs, provides steady assistance with mobility and use of RW, and total A for IADLs. Per staff report pt has caregiver 7am-7pm. No caregiver present at time of evaluation.        OT Problem List: Decreased activity tolerance;Impaired balance  (sitting and/or standing);Decreased safety awareness;Decreased cognition      OT Treatment/Interventions: Self-care/ADL training;Therapeutic exercise;Therapeutic activities;Energy conservation    OT Goals(Current goals can be found in the care plan section) Acute Rehab OT Goals Patient Stated Goal: to get better and return home OT Goal Formulation: With patient Time For Goal Achievement: 03/01/23 Potential to Achieve Goals: Good ADL Goals Pt Will Perform Grooming: standing;with supervision Pt Will Perform Lower Body Dressing: with min assist;sit to/from stand Pt Will Transfer to Toilet: with min guard assist;ambulating Pt Will Perform Toileting - Clothing Manipulation and hygiene: with min guard assist;sit to/from stand  OT Frequency: Min 1X/week       AM-PAC OT "6 Clicks" Daily Activity     Outcome Measure Help from another person eating meals?: None Help from another person taking care of personal grooming?: A Little Help from another person toileting, which includes using toliet, bedpan, or urinal?: A Little Help from another person bathing (including washing, rinsing, drying)?: A Lot Help from another person  to put on and taking off regular upper body clothing?: A Lot Help from another person to put on and taking off regular lower body clothing?: A Lot 6 Click Score: 16   End of Session Equipment Utilized During Treatment: Rolling walker (2 wheels) Nurse Communication: Mobility status  Activity Tolerance: Patient limited by fatigue Patient left: in bed;with bed alarm set;with call bell/phone within reach  OT Visit Diagnosis: Unsteadiness on feet (R26.81);Muscle weakness (generalized) (M62.81)                Time: 1610-9604 OT Time Calculation (min): 16 min Charges:  OT General Charges $OT Visit: 1 Visit OT Evaluation $OT Eval Moderate Complexity: 1 Mod OT Treatments $Therapeutic Activity: 8-22 mins  Jackquline Denmark, MS, OTR/L , CBIS ascom [phone]  02/15/23, 3:08  PM

## 2023-02-15 NOTE — Evaluation (Signed)
Physical Therapy Evaluation Patient Details Name: George [name] MRN: [id number] DOB: 05-11-1932 Today's Date: 02/15/2023  History of Present Illness  presented to ER secondary to SOB, chest pain; admitted for management of CAP  Clinical Impression  Patient resting in bed upon arrival to room; easily awakens to voice and light touch.  Patient wearing hearing aides; caregiver arrived to room mid-session.  Patient oriented to self only; follows commands, pleasant and cooperative throughout session.  Denies pain.  Bilat UE/LE strength and ROM grossly symmetrical and WFL; R 5th digit splint in place throughout session.  Able to complete bed mobility with close sup; sit/stand, basic transfers and gait (25') with RW, cga/close sup.  Demonstrates forward flexed posture, reciprocal stepping pattern with RW slightly anterior to BOS; negotiates turn without difficulty, occasional steadying for safety. Mild SOB with exertion, sats >96% on RA. Patient caregiver present for observation during session; feels patient near baseline and comfortable with this level of care at discharge  Would benefit from skilled PT to address above deficits and promote optimal return to PLOF.; recommend post-acute PT follow up per interdisciplinary care team.       Recommendations for follow up therapy are one component of a multi-disciplinary discharge planning process, led by the attending physician.  Recommendations may be updated based on patient status, additional functional criteria and insurance authorization.  Follow Up Recommendations       Assistance Recommended at Discharge Intermittent Supervision/Assistance  Patient can return home with the following  A little help with walking and/or transfers;A little help with bathing/dressing/bathroom;Assistance with cooking/housework;Assist for transportation;Help with stairs or ramp for entrance    Equipment Recommendations    Recommendations for Other Services        Functional Status Assessment Patient has had a recent decline in their functional status and demonstrates the ability to make significant improvements in function in a reasonable and predictable amount of time.     Precautions / Restrictions Precautions Precautions: Fall Precaution Comments: R 5th digit splint Required Braces or Orthoses: Splint/Cast Splint/Cast: R 5th finger splint Restrictions Weight Bearing Restrictions: No      Mobility  Bed Mobility Overal bed mobility: Modified Independent Bed Mobility: Supine to Sit     Supine to sit: Supervision          Transfers Overall transfer level: Needs assistance Equipment used: Rolling walker (2 wheels) Transfers: Sit to/from Stand, Bed to chair/wheelchair/BSC Sit to Stand: Min guard Stand pivot transfers: Min guard         General transfer comment: uses posterior surface of LEs against edge of bed for lift assist and external stabilization    Ambulation/Gait Ambulation/Gait assistance: Min guard Gait Distance (Feet): 25 Feet Assistive device: Rolling walker (2 wheels)         General Gait Details: forward flexed posture, reciprocal stepping pattern with RW slightly anterior to BOS; negotiates turn without difficulty, occasional steadying for safety.  Mild SOB with exertion, sats >96% on RA.  Patient caregiver present for observation during session; feels patient near baseline and comfortable with this level of care at discharge  Stairs            Wheelchair Mobility    Modified Rankin (Stroke Patients Only)       Balance Overall balance assessment: Needs assistance Sitting-balance support: No upper extremity supported, Feet supported Sitting balance-Leahy Scale: Good     Standing balance support: Bilateral upper extremity supported Standing balance-Leahy Scale: Fair  Pertinent Vitals/Pain Pain Assessment Pain Assessment: No/denies pain    Home  Living Family/patient expects to be discharged to:: Private residence Living Arrangements: Non-relatives/Friends Available Help at Discharge: Other (Comment) Type of Home: House Home Access: Stairs to enter Entrance Stairs-Rails: Right;Left;Can reach both Entrance Stairs-Number of Steps: 4   Home Layout: Two level;Able to live on main level with bedroom/bathroom Home Equipment: Gilmer Mor - single point;Rolling Walker (2 wheels);BSC/3in1      Prior Function Prior Level of Function : Patient poor historian/Family not available;Other (comment)             Mobility Comments: Per PT report (with caregiver present), pt uses RW with +1 assistance for steadying due to poor balance at baseline. No other falls besides the "slip" that lead to the finger fracture. ADLs Comments: Caregiver assists with ADLs, provides steady assistance with mobility and use of RW, and total A for IADLs. Per staff report pt has caregiver 7am-7pm. No caregiver present at time of evaluation.     Hand Dominance   Dominant Hand: Right    Extremity/Trunk Assessment   Upper Extremity Assessment Upper Extremity Assessment: Overall WFL for tasks assessed RUE Deficits / Details: R 5th digit in splint    Lower Extremity Assessment Lower Extremity Assessment: Overall WFL for tasks assessed (grossly at least 4/5 throughout)       Communication   Communication: HOH  Cognition Arousal/Alertness: Awake/alert Behavior During Therapy: WFL for tasks assessed/performed Overall Cognitive Status: History of cognitive impairments - at baseline                                 General Comments: Oriented to self only; unaware of situation, location, date; does follow simple commands, pleasant and cooperative        General Comments      Exercises Other Exercises Other Exercises: Sit/stand with and without RW, cga; does require UE support for lift off, stabilization   Assessment/Plan    PT Assessment  Patient needs continued PT services  PT Problem List Decreased activity tolerance;Decreased balance;Decreased mobility;Decreased coordination;Decreased cognition;Decreased safety awareness;Decreased knowledge of use of DME;Decreased knowledge of precautions;Cardiopulmonary status limiting activity       PT Treatment Interventions DME instruction;Gait training;Stair training;Functional mobility training;Therapeutic activities;Patient/family education;Cognitive remediation;Balance training;Therapeutic exercise    PT Goals (Current goals can be found in the Care Plan section)  Acute Rehab PT Goals Patient Stated Goal: to get better PT Goal Formulation: With patient Time For Goal Achievement: 02/10/23 Potential to Achieve Goals: Fair    Frequency Min 2X/week     Co-evaluation               AM-PAC PT "6 Clicks" Mobility  Outcome Measure Help needed turning from your back to your side while in a flat bed without using bedrails?: None Help needed moving from lying on your back to sitting on the side of a flat bed without using bedrails?: None Help needed moving to and from a bed to a chair (including a wheelchair)?: A Little Help needed standing up from a chair using your arms (e.g., wheelchair or bedside chair)?: A Little Help needed to walk in hospital room?: A Little Help needed climbing 3-5 steps with a railing? : A Lot 6 Click Score: 19    End of Session Equipment Utilized During Treatment: Gait belt Activity Tolerance: Patient tolerated treatment well Patient left: in chair;with call bell/phone within reach;with chair alarm set;with family/visitor  present Nurse Communication: Mobility status PT Visit Diagnosis: Muscle weakness (generalized) (M62.81);Difficulty in walking, not elsewhere classified (R26.2)    Time: 7829-5621 PT Time Calculation (min) (ACUTE ONLY): 19 min   Charges:   PT Evaluation $PT Eval Moderate Complexity: 1 Mod          Kirin Pastorino H. Manson Passey, PT,  DPT, NCS 02/15/23, 4:12 PM [phone]

## 2023-02-15 NOTE — Progress Notes (Signed)
PROGRESS NOTE    George [name]  NWG:[id number] DOB: 1932-06-26 DOA: 02/14/2023 PCP: [name], MD   Brief Narrative:  This 89 years old Male with PMH significant for dementia, hypertension, COPD, chronic tobacco abuse, diabetes, GERD, coronary artery disease presented in the ED with C/O: chest pain.  Patient lives by self and has care for several hours per day.  Patient's caregiver Tonita Phoenix who is healthcare power of attorney,  She states patient started complaining about chest pain earlier today.  EMS was called.  Patient was not hypoxic.  He was brought in the ED for further evaluation.  Caregiver states that patient coughs a lot but that is not different than his usual.  Patient continues to smoke.  Never had any fever at home.  He was hemodynamically stable on arrival in the ED.  Chest x-ray shows left upper lobe infiltrate.  CT scan ruled out pulmonary embolism but shows consolidation of left upper lobe.  Patient is started on IV antibiotics and admitted for further evaluation.  Assessment & Plan:   Principal Problem:   CAP (community acquired pneumonia) Active Problems:   Tobacco abuse   Essential hypertension   COPD (chronic obstructive pulmonary disease) (HCC)   Type II diabetes mellitus with renal manifestations (HCC)   CKD stage 3b, GFR 30-44 ml/min (HCC)   DNR (do not resuscitate)  Community-acquired pneumonia: Patient presented with intermittent productive cough, chest pain in the setting of smoking. Patient is not hypoxic, afebrile and with normal WBC. Chest x-ray shows left upper lobe infiltrate.   CTA chest ruled out pulmonary embolism, but shows left upper lobe consolidation. Continue empiric antibiotics ceftriaxone and Zithromax.  DNR (do not resuscitate) Discussed HCPOA. She wants pt to remains DNR/DNI.   CKD stage IIIb: Serum creatinine at baseline.  Avoid nephrotoxic medications  Type 2 diabetes with renal manifestations: Continue regular insulin  sliding scale.   Hold p.o. diabetic medications.   COPD: Stable. Does not appear in acute exacerbation.  Continue inhalers.   Essential hypertension Stable.  Continue verapamil.   Tobacco abuse Continues to smoke. Caregiver has been trying to get pt to quit.  Pt states when he stops breathing, he will quit smoking. Pt has no intention of quitting smoking.    DVT prophylaxis: Heparin Sq Code Status: DNR Family Communication: No family at bedside Disposition Plan:   Status is: Observation The patient remains OBS appropriate and will d/c before 2 midnights.   Patient admitted for chest pain likely secondary to community-acquired pneumonia requiring IV antibiotics.   Consultants:  None  Procedures: CTA Chest Antimicrobials:  Anti-infectives (From admission, onward)    Start     Dose/Rate Route Frequency Ordered Stop   02/15/23 2000  cefTRIAXone (ROCEPHIN) 2 g in sodium chloride 0.9 % 100 mL IVPB        2 g 200 mL/hr over 30 Minutes Intravenous Every 24 hours 02/15/23 0019 02/19/23 1959   02/15/23 2000  azithromycin (ZITHROMAX) tablet 500 mg        500 mg Oral Daily 02/15/23 0019 02/19/23 0959   02/14/23 1945  cefTRIAXone (ROCEPHIN) 2 g in sodium chloride 0.9 % 100 mL IVPB  Status:  Discontinued        2 g 200 mL/hr over 30 Minutes Intravenous Every 24 hours 02/14/23 1937 02/15/23 0023   02/14/23 1945  azithromycin (ZITHROMAX) 500 mg in sodium chloride 0.9 % 250 mL IVPB  Status:  Discontinued        500 mg  250 mL/hr over 60 Minutes Intravenous Every 24 hours 02/14/23 1937 02/15/23 0024      Subjective: Patient seen and examined at bedside.  Overnight events noted.   Patient is hard of hearing.  He reports having chest pain, states pain gets worse when he takes deep breath.  Objective: Vitals:   02/15/23 0452 02/15/23 0524 02/15/23 0748 02/15/23 1146  BP: (!) 150/76 (!) 109/59 (!) 120/55 (!) 109/56  Pulse: 98 66 75 72  Resp: 18 19 16 16   Temp: 98.6 F (37 C) 98.4  F (36.9 C) 98 F (36.7 C)   TempSrc: Oral Oral Oral   SpO2: 100% 96% 95% 97%  Weight: 71.6 kg     Height:        Intake/Output Summary (Last 24 hours) at 02/15/2023 1217 Last data filed at 02/15/2023 0900 Gross per 24 hour  Intake --  Output 550 ml  Net -550 ml   Filed Weights   02/14/23 1614 02/15/23 0018 02/15/23 0452  Weight: 81.6 kg 71.7 kg 71.6 kg    Examination:  General exam: Appears calm and comfortable, deconditioned, hard of hearing Respiratory system: Clear to auscultation. Respiratory effort normal.  RR 15 Cardiovascular system: S1 & S2 heard, RRR. No JVD, murmurs, rubs, gallops or clicks.  Gastrointestinal system: Abdomen is soft, non tender, non distended, BS+ Central nervous system: Alert and oriented x 2. No focal neurological deficits. Extremities: No edema, no cyanosis, no clubbing. Skin: No rashes, lesions or ulcers Psychiatry: Judgement and insight appear normal. Mood & affect appropriate.     Data Reviewed: I have personally reviewed following labs and imaging studies  CBC: Recent Labs  Lab 02/14/23 1619 02/15/23 0454  WBC 10.6* 12.4*  NEUTROABS  --  8.6*  HGB 10.8* 10.7*  HCT 33.1* 31.7*  MCV 93.2 91.1  PLT 275 286   Basic Metabolic Panel: Recent Labs  Lab 02/14/23 1619 02/15/23 0454  NA 142 138  K 4.3 4.2  CL 107 108  CO2 24 22  GLUCOSE 111* 86  BUN 34* 32*  CREATININE 1.67* 1.56*  CALCIUM 8.7* 8.6*   GFR: Estimated Creatinine Clearance: 32.5 mL/min (A) (by C-G formula based on SCr of 1.56 mg/dL (H)). Liver Function Tests: Recent Labs  Lab 02/15/23 0454  AST 19  ALT 12  ALKPHOS 86  BILITOT 0.4  PROT 5.6*  ALBUMIN 2.9*   No results for input(s): "LIPASE", "AMYLASE" in the last 168 hours. No results for input(s): "AMMONIA" in the last 168 hours. Coagulation Profile: No results for input(s): "INR", "PROTIME" in the last 168 hours. Cardiac Enzymes: No results for input(s): "CKTOTAL", "CKMB", "CKMBINDEX", "TROPONINI" in  the last 168 hours. BNP (last 3 results) No results for input(s): "PROBNP" in the last 8760 hours. HbA1C: Recent Labs    02/14/23 1620  HGBA1C 5.8*   CBG: Recent Labs  Lab 02/15/23 0055 02/15/23 0751 02/15/23 1137  GLUCAP 90 81 115*   Lipid Profile: No results for input(s): "CHOL", "HDL", "LDLCALC", "TRIG", "CHOLHDL", "LDLDIRECT" in the last 72 hours. Thyroid Function Tests: No results for input(s): "TSH", "T4TOTAL", "FREET4", "T3FREE", "THYROIDAB" in the last 72 hours. Anemia Panel: No results for input(s): "VITAMINB12", "FOLATE", "FERRITIN", "TIBC", "IRON", "RETICCTPCT" in the last 72 hours. Sepsis Labs: No results for input(s): "PROCALCITON", "LATICACIDVEN" in the last 168 hours.  No results found for this or any previous visit (from the past 240 hour(s)).   Radiology Studies: CT Angio Chest PE W/Cm &/Or Wo Cm  Result Date: 02/14/2023 CLINICAL  DATA:  Pulmonary embolism (PE) suspected, high prob Chest pain. EXAM: CT ANGIOGRAPHY CHEST WITH CONTRAST TECHNIQUE: Multidetector CT imaging of the chest was performed using the standard protocol during bolus administration of intravenous contrast. Multiplanar CT image reconstructions and MIPs were obtained to evaluate the vascular anatomy. RADIATION DOSE REDUCTION: This exam was performed according to the departmental dose-optimization program which includes automated exposure control, adjustment of the mA and/or kV according to patient size and/or use of iterative reconstruction technique. CONTRAST:  75mL OMNIPAQUE IOHEXOL 350 MG/ML SOLN COMPARISON:  Radiograph earlier today., most recent CT 06/27/2019 FINDINGS: Cardiovascular: There are no filling defects within the pulmonary arteries to suggest pulmonary embolus. Aortic atherosclerosis. No aneurysm or acute aortic findings. The heart is normal in size. There are coronary artery calcifications. Small pericardial effusion appears chronic. Mediastinum/Nodes: Mildly enlarged left hilar nodes  measuring up to 11 mm, series 4, image 70. 10 mm right hilar node, series 4, image 65. There is an 11 mm anterior lower paratracheal node series 4, image 59. This is similar to prior exam. There additional small and borderline mediastinal nodes. No esophageal wall thickening. Lungs/Pleura: Advanced emphysema. Irregularly-shaped left upper lobe airspace consolidation, series 5, image 45. This is contiguous with a spiculated nodular density anteriorly in the left upper lobe measuring 12 mm, series 5, image 53. Anterior to this spiculated nodule is a small subpleural densities series 5, image 59. Peripheral linear opacities extend from the dominant opacity towards the apex with ill-defined subpleural opacity series 5, image 21. There is a spiculated 6 mm right apical pulmonary nodule, series 5 image 16. 10 mm irregular nodule in the right lower lobe series 5, image 74. Additional smaller nodules throughout both lungs. Moderate bronchial thickening with occasional areas of mucoid impaction. Retained mucus within the dependent trachea and both mainstem bronchi. No significant pleural effusion. Upper Abdomen: Gallstones. No acute upper abdominal findings. Musculoskeletal: The bones are subjectively under mineralized. Left shoulder arthroplasty. There are no acute or suspicious osseous abnormalities. Review of the MIP images confirms the above findings. IMPRESSION: 1. No pulmonary embolus. 2. Multifocal irregular nodular airspace disease and pulmonary nodules within both lungs. Dominant consolidation in the left upper lobe. Recommend close radiographic and CT follow-up to ensure resolution and exclude pulmonary neoplasm. CT follow-up in 3-4 weeks would be reasonable. 3. Moderate to advanced emphysema. Bronchial thickening. Retained mucus within the dependent trachea and both mainstem bronchi. 4. Incidental note of gallstones in the upper abdomen. Aortic Atherosclerosis (ICD10-I70.0) and Emphysema (ICD10-J43.9).  Electronically Signed   By: Narda Rutherford M.D.   On: 02/14/2023 20:58   DG Chest 2 View  Result Date: 02/14/2023 CLINICAL DATA:  Chest pain EXAM: CHEST - 2 VIEW COMPARISON:  01/25/2023 FINDINGS: Normal heart size, mediastinal contours, and pulmonary vascularity. Atherosclerotic calcification aorta. Emphysematous and bronchitic changes consistent with COPD. New RIGHT upper lobe infiltrate. Slight accentuation of interstitial markings at mid to lower lung stable. No pleural effusion or pneumothorax. Bones demineralized with note of a LEFT shoulder prosthesis. IMPRESSION: COPD changes with LEFT upper lobe infiltrate consistent with pneumonia new since prior exam. Electronically Signed   By: Ulyses Southward M.D.   On: 02/14/2023 17:42    Scheduled Meds:  aspirin EC  81 mg Oral Daily   azithromycin  500 mg Oral Daily   fluticasone furoate-vilanterol  1 puff Inhalation Daily   And   umeclidinium bromide  1 puff Inhalation Daily   heparin  5,000 Units Subcutaneous Q8H   insulin aspart  0-5  Units Subcutaneous QHS   insulin aspart  0-9 Units Subcutaneous TID WC   pantoprazole  40 mg Oral Daily   simvastatin  10 mg Oral QHS   verapamil  240 mg Oral BID   Continuous Infusions:  cefTRIAXone (ROCEPHIN)  IV       LOS: 0 days    Time spent: 50 mins    Willeen Niece, MD [facility]   If 7PM-7AM, please contact night-coverage

## 2023-02-15 NOTE — Plan of Care (Signed)

## 2023-02-15 NOTE — Progress Notes (Signed)
PT Cancellation Note  Patient Details Name: George [name] MRN: [id number] DOB: 17-Oct-1931   Cancelled Treatment:    Reason Eval/Treat Not Completed:  (Consult received and chart reviewed. Patient sleeping soundly, but awakens upon arrival to room; extremely HOH. Caregiver planning to arrive with hearing aides around lunch; will re-attempt eval at that time to allow for more thorough session.)   George [name] H. Manson Passey, PT, DPT, NCS 02/15/23, 9:54 AM [phone]

## 2023-02-16 DIAGNOSIS — J189 Pneumonia, unspecified organism: Secondary | ICD-10-CM | POA: Diagnosis not present

## 2023-02-16 LAB — CBC
HCT: 32.8 % — ABNORMAL LOW (ref 39.0–52.0)
Hemoglobin: 10.9 g/dL — ABNORMAL LOW (ref 13.0–17.0)
MCH: 30.4 pg (ref 26.0–34.0)
MCHC: 33.2 g/dL (ref 30.0–36.0)
MCV: 91.6 fL (ref 80.0–100.0)
Platelets: 285 10*3/uL (ref 150–400)
RBC: 3.58 MIL/uL — ABNORMAL LOW (ref 4.22–5.81)
RDW: 13.6 % (ref 11.5–15.5)
WBC: 10.2 10*3/uL (ref 4.0–10.5)
nRBC: 0 % (ref 0.0–0.2)

## 2023-02-16 LAB — BASIC METABOLIC PANEL
Anion gap: 6 (ref 5–15)
BUN: 30 mg/dL — ABNORMAL HIGH (ref 8–23)
CO2: 26 mmol/L (ref 22–32)
Calcium: 8.7 mg/dL — ABNORMAL LOW (ref 8.9–10.3)
Chloride: 106 mmol/L (ref 98–111)
Creatinine, Ser: 1.48 mg/dL — ABNORMAL HIGH (ref 0.61–1.24)
GFR, Estimated: 45 mL/min — ABNORMAL LOW (ref >60)
Glucose, Bld: 116 mg/dL — ABNORMAL HIGH (ref 70–99)
Potassium: 3.9 mmol/L (ref 3.5–5.1)
Sodium: 138 mmol/L (ref 135–145)

## 2023-02-16 LAB — PHOSPHORUS: Phosphorus: 3.3 mg/dL (ref 2.5–4.6)

## 2023-02-16 LAB — GLUCOSE, CAPILLARY: Glucose-Capillary: 77 mg/dL (ref 70–99)

## 2023-02-16 LAB — MAGNESIUM: Magnesium: 1.6 mg/dL — ABNORMAL LOW (ref 1.7–2.4)

## 2023-02-16 MED ORDER — AMOXICILLIN-POT CLAVULANATE 875-125 MG PO TABS: 1.0000 | ORAL_TABLET | Freq: Two times a day (BID) | ORAL | Status: DC

## 2023-02-16 MED ORDER — ENOXAPARIN SODIUM 40 MG/0.4ML IJ SOSY: 40.0000 mg | PREFILLED_SYRINGE | INTRAMUSCULAR | Status: DC

## 2023-02-16 MED ORDER — AMOXICILLIN-POT CLAVULANATE 875-125 MG PO TABS: 1.0000 | ORAL_TABLET | Freq: Two times a day (BID) | ORAL | 0 refills | Status: AC

## 2023-02-16 MED ORDER — MAGNESIUM SULFATE 2 GM/50ML IV SOLN
2.0000 g | Freq: Once | INTRAVENOUS | Status: AC
  Administered 2023-02-16: 2 g via INTRAVENOUS
  Filled 2023-02-16: qty 50

## 2023-02-16 NOTE — TOC Initial Note (Signed)
Transition of Care [facility]) - Initial/Assessment Note    Patient Details  Name: George [name] MRN: [id number] Date of Birth: 1932/04/11  Transition of Care [facility]) CM/SW Contact:    Allena Katz, LCSW Phone Number: 02/16/2023, 9:03 AM  Clinical Narrative: CSW spoke with patients caregiver Angelique Blonder who reports that she lives with patient and he pays privately for these services and does not go through an agency.  Angelique Blonder states pt is still active with Dr. Terance Hart for primary care. She states that she would like to continue with [facility] for PT/RN. Angelique Blonder reports pt has no other home or DME needs at this time. Pt has a cane, walker, and BSC.              Expected Discharge Plan: Home w Home Health Services Barriers to Discharge: Continued Medical Work up   Patient Goals and CMS Choice Patient states their goals for this hospitalization and ongoing recovery are:: return home CMS Medicare.gov Compare Post Acute Care list provided to:: Patient Represenative (must comment) (POA Angelique Blonder) Choice offered to / list presented to : [facility] POA / Guardian      Expected Discharge Plan and Services                                   HH Arranged: PT, RN [facility] Agency: [facility]     Representative spoke with at [facility] Agency: Barbara Cower  Prior Living Arrangements/Services   Lives with::  (caregiver) Patient language and need for interpreter reviewed:: Yes Do you feel safe going back to the place where you live?: Yes      Need for Family Participation in Patient Care: Yes (Comment) Care giver support system in place?: Yes (comment) Current home services: DME, Home PT, Home RN    Activities of Daily Living Home Assistive Devices/Equipment: Cane (specify quad or straight) ADL Screening (condition at time of admission) Patient's cognitive ability adequate to safely complete daily activities?: No Is the patient deaf or have difficulty hearing?: Yes Does the patient have  difficulty seeing, even when wearing glasses/contacts?: No Does the patient have difficulty concentrating, remembering, or making decisions?: Yes Patient able to express need for assistance with ADLs?: Yes Does the patient have difficulty dressing or bathing?: Yes Independently performs ADLs?: No Communication: Needs assistance Is this a change from baseline?: Pre-admission baseline Dressing (OT): Needs assistance Is this a change from baseline?: Pre-admission baseline Grooming: Needs assistance Is this a change from baseline?: Pre-admission baseline Feeding: Needs assistance Is this a change from baseline?: Pre-admission baseline Bathing: Needs assistance Is this a change from baseline?: Pre-admission baseline Toileting: Needs assistance Is this a change from baseline?: Pre-admission baseline In/Out Bed: Needs assistance, Independent Is this a change from baseline?: Pre-admission baseline Walks in Home: Needs assistance Is this a change from baseline?: Pre-admission baseline Does the patient have difficulty walking or climbing stairs?: Yes Weakness of Legs: None Weakness of Arms/Hands: None  Permission Sought/Granted         Permission granted to share info w AGENCY: [facility]  Permission granted to share info w Relationship: [facility]     Emotional Assessment       Orientation: : Fluctuating Orientation (Suspected and/or reported Sundowners)      Admission diagnosis:  CAP (community acquired pneumonia) [J18.9] Chest pain, unspecified type [R07.9] Community acquired pneumonia of left upper lobe of lung [J18.9] Patient Active Problem List   Diagnosis Date  Noted   DNR (do not resuscitate) 02/14/2023   Chronic respiratory failure with hypoxia (HCC) 01/25/2023   History of fracture of right fifth finger on 01/18/23 01/25/2023   OSA (obstructive sleep apnea) 01/25/2023   Acute on chronic respiratory failure with hypoxia (HCC) 06/08/2021   Gout 06/08/2021   CAP (community  acquired pneumonia) 06/08/2021   Status post reverse arthroplasty of shoulder, left 02/10/2021   CKD stage 3b, GFR 30-44 ml/min (HCC) 11/04/2020   BPH with obstruction/lower urinary tract symptoms 11/04/2020   Foot pain 07/11/2018   Urinary urgency 06/27/2017   Erectile dysfunction 06/27/2017   Hypogonadism in male 06/27/2017   Tobacco abuse 02/09/2016   Essential hypertension 02/09/2016   GERD (gastroesophageal reflux disease) 02/09/2016   Hyperlipidemia 04/22/2014   Hypertension 04/22/2014   COPD (chronic obstructive pulmonary disease) (HCC) 04/22/2014   Type II diabetes mellitus with renal manifestations (HCC) 04/22/2014   CAD (coronary artery disease) 09/10/1998   PCP:  [name], MD Pharmacy:   [facility] [place], [place] - [address] [place] [address] Phone: [phone] Fax: [phone]  [facility] [address] ([address]), [place] - [address] [place] ([place]) [place] [address] Phone: [phone] Fax: [phone]     Social Determinants of Health (SDOH) Social History: SDOH Screenings   Food Insecurity: Patient Unable To Answer (02/15/2023)  Housing: Low Risk  (01/26/2023)  Transportation Needs: No Transportation Needs (01/26/2023)  Utilities: Not At Risk (01/26/2023)  Tobacco Use: High Risk (02/15/2023)   SDOH Interventions:     Readmission Risk Interventions    01/28/2023    1:51 PM  Readmission Risk Prevention Plan  Transportation Screening Complete  HRI or Home Care Consult Complete  Palliative Care Screening Complete  Medication Review (RN Care Manager) Complete

## 2023-02-16 NOTE — Discharge Instructions (Signed)
Advised to follow-up with primary care physician in 1 week. Advised to take Augmentin twice daily for 3 more days to complete 5-day treatment for community-acquired pneumonia. Will request primary care physician to repeat CT chest in 3 to 4 weeks for resolution of pneumonia.

## 2023-02-16 NOTE — Progress Notes (Signed)
Mobility Specialist - Progress Note   02/16/23 0944  Mobility  Activity Ambulated with assistance in room;Stood at bedside;Dangled on edge of bed  Level of Assistance Contact guard assist, steadying assist  Assistive Device Front wheel walker  Distance Ambulated (ft) 20 ft  Activity Response Tolerated well  Mobility Referral Yes  $Mobility charge 1 Mobility  Mobility Specialist Start Time (ACUTE ONLY) [id number]  Mobility Specialist Stop Time (ACUTE ONLY) 0935  Mobility Specialist Time Calculation (min) (ACUTE ONLY) 10 min   Pt supine in bed on 2L upon arrival. Pt completes bed mobility indep. Pt STS and ambulates to door and back CGA with no LOB noted. Pt desats to 88 SPO2 but rises quickly with rest. Pt left semi-supine in bed with needs in reach and bed alarm on.   2L Rest (before ambulation) SPO2 = 97 Room Air Ambulating  SPO2 = 88 2L Rest (after ambulation)  SPO2 = 95   [facility]  Mobility Specialist  02/16/23 9:47 AM

## 2023-02-16 NOTE — Discharge Summary (Signed)
Physician Discharge Summary  George [name]:[id number] DOB: 1932/06/22 DOA: 02/14/2023  PCP: George Baseman, MD  Admit date: 02/14/2023  Discharge date: 02/16/2023  Admitted From: Home.  Disposition:  Home Health Services.  Recommendations for Outpatient Follow-up:  Follow up with PCP in 1-2 weeks. Please obtain BMP/CBC in one week. Advised to take Augmentin twice daily for 3 more days to complete 5-day treatment for community-acquired pneumonia. Will request primary care physician to repeat CT chest in 3 to 4 weeks for resolution of pneumonia.  Home Health:Home PT/OT Equipment/Devices:None  Discharge Condition: Stable CODE STATUS:DNR Diet recommendation: Heart Healthy   Brief Summary/ Hospital Course: This 89 years old Male with PMH significant for dementia, hypertension, COPD, chronic tobacco abuse, diabetes, GERD, coronary artery disease presented in the ED with C/O: chest pain.  Patient lives by self and has care for several hours per day.  Patient's caregiver Tonita Phoenix who is healthcare power of attorney,  She states patient started complaining about chest pain earlier today.  EMS was called.  Patient was not hypoxic.  He was brought in the ED for further evaluation.  Caregiver states that patient coughs a lot but that is not different than his usual.  Patient continues to smoke.  Never had any fever at home.  He was hemodynamically stable on arrival in the ED.  Chest x-ray shows left upper lobe infiltrate.  CT scan ruled out pulmonary embolism but shows consolidation of left upper lobe.  Patient is started on IV antibiotics and admitted for further evaluation.  Patient was continued on IV antibiotics.  Patient seems much improved and wants to be discharged.  Antibiotics changed to Augmentin.  Home health services arranged.  Patient is being discharged home.  Discharge Diagnoses:  Principal Problem:   CAP (community acquired pneumonia) Active Problems:   Tobacco abuse    Essential hypertension   COPD (chronic obstructive pulmonary disease) (HCC)   Type II diabetes mellitus with renal manifestations (HCC)   CKD stage 3b, GFR 30-44 ml/min (HCC)   DNR (do not resuscitate)  Community-acquired pneumonia: Patient presented with intermittent productive cough, chest pain in the setting of smoking. Patient is not hypoxic, afebrile and with normal WBC. Chest x-ray shows left upper lobe infiltrate.   CTA chest ruled out pulmonary embolism, but shows left upper lobe consolidation. Continue empiric antibiotics ceftriaxone and Zithromax Patient being discharged home on Augmentin.   DNR (do not resuscitate) Discussed HCPOA. She wants pt to remains DNR/DNI.   CKD stage IIIb: Serum creatinine at baseline.  Avoid nephrotoxic medications.   Type 2 diabetes with renal manifestations: Continue regular insulin sliding scale.   Hold p.o. diabetic medications.   COPD: Stable. Does not appear in acute exacerbation.  Continue inhalers.   Essential hypertension Stable.  Continue verapamil.   Tobacco abuse Continues to smoke. Caregiver has been trying to get pt to quit.  Pt states when he stops breathing, he will quit smoking. Pt has no intention of quitting smoking.    Discharge Instructions  Discharge Instructions     Call MD for:  difficulty breathing, headache or visual disturbances   Complete by: As directed    Call MD for:  persistant dizziness or light-headedness   Complete by: As directed    Call MD for:  persistant nausea and vomiting   Complete by: As directed    Diet - low sodium heart healthy   Complete by: As directed    Diet Carb Modified   Complete by: As  directed    Discharge instructions   Complete by: As directed    Advised to follow-up with primary care physician in 1 week. Advised to take Augmentin twice daily for 3 more days to complete 5-day treatment for community-acquired pneumonia. Will request primary care physician to repeat CT  chest in 3 to 4 weeks for resolution of pneumonia.   Increase activity slowly   Complete by: As directed       Allergies as of 02/16/2023   No Known Allergies      Medication List     TAKE these medications    acidophilus Caps capsule Take 2 capsules by mouth daily.   albuterol 108 (90 Base) MCG/ACT inhaler Commonly known as: VENTOLIN HFA Inhale 1-2 puffs into the lungs every 6 (six) hours as needed for wheezing or shortness of breath.   albuterol (2.5 MG/3ML) 0.083% nebulizer solution Commonly known as: PROVENTIL Take 2.5 mg by nebulization every 6 (six) hours as needed for wheezing.   amoxicillin-clavulanate 875-125 MG tablet Commonly known as: AUGMENTIN Take 1 tablet by mouth every 12 (twelve) hours for 3 days.   aspirin EC 81 MG tablet Take 81 mg by mouth daily. Swallow whole.   colchicine 0.6 MG tablet Take 0.5 tablets (0.3 mg total) by mouth daily.   cyanocobalamin 1000 MCG/ML injection Commonly known as: VITAMIN B12 Inject 1,000 mcg into the muscle every 30 (thirty) days.   HYDROcodone-acetaminophen 5-325 MG tablet Commonly known as: NORCO/VICODIN Take 1-2 tablets by mouth every 4 (four) hours as needed for moderate pain.   hyoscyamine 0.125 MG tablet Commonly known as: LEVSIN Take 0.125 mg by mouth every 4 (four) hours as needed for cramping.   omeprazole 20 MG capsule Commonly known as: PRILOSEC Take 20 mg by mouth every morning.   simvastatin 20 MG tablet Commonly known as: ZOCOR Take 10 mg by mouth at bedtime.   traMADol 50 MG tablet Commonly known as: ULTRAM Take 50 mg by mouth every 8 (eight) hours as needed for moderate pain.   Trelegy Ellipta 100-62.5-25 MCG/ACT Aepb Generic drug: Fluticasone-Umeclidin-Vilant Inhale 1 puff into the lungs every morning.   verapamil 240 MG CR tablet Commonly known as: CALAN-SR Take 240 mg by mouth 2 (two) times daily.        Follow-up Information     George Baseman, MD Follow up in 1 week(s).    Specialty: Family Medicine Contact information: [address] [place] [address] [phone]                No Known Allergies  Consultations: None   Procedures/Studies: CT Angio Chest PE W/Cm &/Or Wo Cm  Result Date: 02/14/2023 CLINICAL DATA:  Pulmonary embolism (PE) suspected, high prob Chest pain. EXAM: CT ANGIOGRAPHY CHEST WITH CONTRAST TECHNIQUE: Multidetector CT imaging of the chest was performed using the standard protocol during bolus administration of intravenous contrast. Multiplanar CT image reconstructions and MIPs were obtained to evaluate the vascular anatomy. RADIATION DOSE REDUCTION: This exam was performed according to the departmental dose-optimization program which includes automated exposure control, adjustment of the mA and/or kV according to patient size and/or use of iterative reconstruction technique. CONTRAST:  75mL OMNIPAQUE IOHEXOL 350 MG/ML SOLN COMPARISON:  Radiograph earlier today., most recent CT 06/27/2019 FINDINGS: Cardiovascular: There are no filling defects within the pulmonary arteries to suggest pulmonary embolus. Aortic atherosclerosis. No aneurysm or acute aortic findings. The heart is normal in size. There are coronary artery calcifications. Small pericardial effusion appears chronic. Mediastinum/Nodes: Mildly enlarged  left hilar nodes measuring up to 11 mm, series 4, image 70. 10 mm right hilar node, series 4, image 65. There is an 11 mm anterior lower paratracheal node series 4, image 59. This is similar to prior exam. There additional small and borderline mediastinal nodes. No esophageal wall thickening. Lungs/Pleura: Advanced emphysema. Irregularly-shaped left upper lobe airspace consolidation, series 5, image 45. This is contiguous with a spiculated nodular density anteriorly in the left upper lobe measuring 12 mm, series 5, image 53. Anterior to this spiculated nodule is a small subpleural densities series 5, image 59. Peripheral linear  opacities extend from the dominant opacity towards the apex with ill-defined subpleural opacity series 5, image 21. There is a spiculated 6 mm right apical pulmonary nodule, series 5 image 16. 10 mm irregular nodule in the right lower lobe series 5, image 74. Additional smaller nodules throughout both lungs. Moderate bronchial thickening with occasional areas of mucoid impaction. Retained mucus within the dependent trachea and both mainstem bronchi. No significant pleural effusion. Upper Abdomen: Gallstones. No acute upper abdominal findings. Musculoskeletal: The bones are subjectively under mineralized. Left shoulder arthroplasty. There are no acute or suspicious osseous abnormalities. Review of the MIP images confirms the above findings. IMPRESSION: 1. No pulmonary embolus. 2. Multifocal irregular nodular airspace disease and pulmonary nodules within both lungs. Dominant consolidation in the left upper lobe. Recommend close radiographic and CT follow-up to ensure resolution and exclude pulmonary neoplasm. CT follow-up in 3-4 weeks would be reasonable. 3. Moderate to advanced emphysema. Bronchial thickening. Retained mucus within the dependent trachea and both mainstem bronchi. 4. Incidental note of gallstones in the upper abdomen. Aortic Atherosclerosis (ICD10-I70.0) and Emphysema (ICD10-J43.9). Electronically Signed   By: Narda Rutherford M.D.   On: 02/14/2023 20:58   DG Chest 2 View  Result Date: 02/14/2023 CLINICAL DATA:  Chest pain EXAM: CHEST - 2 VIEW COMPARISON:  01/25/2023 FINDINGS: Normal heart size, mediastinal contours, and pulmonary vascularity. Atherosclerotic calcification aorta. Emphysematous and bronchitic changes consistent with COPD. New RIGHT upper lobe infiltrate. Slight accentuation of interstitial markings at mid to lower lung stable. No pleural effusion or pneumothorax. Bones demineralized with note of a LEFT shoulder prosthesis. IMPRESSION: COPD changes with LEFT upper lobe infiltrate  consistent with pneumonia new since prior exam. Electronically Signed   By: Ulyses Southward M.D.   On: 02/14/2023 17:42   DG Wrist Complete Right  Result Date: 01/25/2023 CLINICAL DATA:  Right hand ring finger redness and open wound. Cast on from fracture pinky placed 01/18/2023 EXAM: RIGHT WRIST - COMPLETE 3+ VIEW; RIGHT HAND - COMPLETE 3+ VIEW COMPARISON:  Radiographs 01/11/2023 FINDINGS: Redemonstrated comminuted fracture of the fifth finger proximal phalanx. Redemonstrated mildly displaced intra-articular radial styloid fracture. No additional fractures. Soft tissue swelling about the fourth and fifth fingers. IMPRESSION: 1. Redemonstrated comminuted fracture of the fifth finger proximal phalanx. 2. Redemonstrated mildly displaced intra-articular radial styloid fracture. 3. Soft tissue swelling about the fourth and fifth fingers. Electronically Signed   By: Minerva Fester M.D.   On: 01/25/2023 23:07   DG Hand Complete Right  Result Date: 01/25/2023 CLINICAL DATA:  Right hand ring finger redness and open wound. Cast on from fracture pinky placed 01/18/2023 EXAM: RIGHT WRIST - COMPLETE 3+ VIEW; RIGHT HAND - COMPLETE 3+ VIEW COMPARISON:  Radiographs 01/11/2023 FINDINGS: Redemonstrated comminuted fracture of the fifth finger proximal phalanx. Redemonstrated mildly displaced intra-articular radial styloid fracture. No additional fractures. Soft tissue swelling about the fourth and fifth fingers. IMPRESSION: 1. Redemonstrated comminuted fracture of  the fifth finger proximal phalanx. 2. Redemonstrated mildly displaced intra-articular radial styloid fracture. 3. Soft tissue swelling about the fourth and fifth fingers. Electronically Signed   By: Minerva Fester M.D.   On: 01/25/2023 23:07   DG Chest 2 View  Result Date: 01/25/2023 CLINICAL DATA:  Weakness and cough EXAM: CHEST - 2 VIEW COMPARISON:  Chest radiograph 06/08/2021 FINDINGS: Stable cardiomediastinal silhouette. Aortic atherosclerotic calcification.  Hyperinflation and chronic bronchitic changes. Bibasilar scarring. No focal consolidation, effusion, or pneumothorax. No displaced rib fractures. Left reverse TSA. IMPRESSION: No active cardiopulmonary disease.  Emphysema. Electronically Signed   By: Minerva Fester M.D.   On: 01/25/2023 20:44     Subjective: Patient was seen and examined at bedside.  Overnight events noted.   Patient report doing much better and wants to be discharged.  Home and services arranged.  Discharge Exam: Vitals:   02/16/23 0536 02/16/23 0749  BP: 129/79 137/73  Pulse: 78 86  Resp: 19 18  Temp: 97.8 F (36.6 C) 98.1 F (36.7 C)  SpO2: 90% 100%   Vitals:   02/15/23 2029 02/16/23 0500 02/16/23 0536 02/16/23 0749  BP:   129/79 137/73  Pulse:   78 86  Resp:   19 18  Temp:   97.8 F (36.6 C) 98.1 F (36.7 C)  TempSrc:   Oral Oral  SpO2: 98%  90% 100%  Weight:  68.3 kg    Height:        General: Pt is alert, awake, not in acute distress Cardiovascular: RRR, S1/S2 +, no rubs, no gallops Respiratory: CTA bilaterally, no wheezing, no rhonchi Abdominal: Soft, NT, ND, bowel sounds + Extremities: no edema, no cyanosis    The results of significant diagnostics from this hospitalization (including imaging, microbiology, ancillary and laboratory) are listed below for reference.     Microbiology: No results found for this or any previous visit (from the past 240 hour(s)).   Labs: BNP (last 3 results) No results for input(s): "BNP" in the last 8760 hours. Basic Metabolic Panel: Recent Labs  Lab 02/14/23 1619 02/15/23 0454 02/16/23 0601  NA 142 138 138  K 4.3 4.2 3.9  CL 107 108 106  CO2 24 22 26   GLUCOSE 111* 86 116*  BUN 34* 32* 30*  CREATININE 1.67* 1.56* 1.48*  CALCIUM 8.7* 8.6* 8.7*  MG  --   --  1.6*  PHOS  --   --  3.3   Liver Function Tests: Recent Labs  Lab 02/15/23 0454  AST 19  ALT 12  ALKPHOS 86  BILITOT 0.4  PROT 5.6*  ALBUMIN 2.9*   No results for input(s): "LIPASE",  "AMYLASE" in the last 168 hours. No results for input(s): "AMMONIA" in the last 168 hours. CBC: Recent Labs  Lab 02/14/23 1619 02/15/23 0454 02/16/23 0601  WBC 10.6* 12.4* 10.2  NEUTROABS  --  8.6*  --   HGB 10.8* 10.7* 10.9*  HCT 33.1* 31.7* 32.8*  MCV 93.2 91.1 91.6  PLT 275 286 285   Cardiac Enzymes: No results for input(s): "CKTOTAL", "CKMB", "CKMBINDEX", "TROPONINI" in the last 168 hours. BNP: Invalid input(s): "POCBNP" CBG: Recent Labs  Lab 02/15/23 1137 02/15/23 1542 02/15/23 1945 02/15/23 2053 02/16/23 0751  GLUCAP 115* 143* 74 90 77   D-Dimer No results for input(s): "DDIMER" in the last 72 hours. Hgb A1c Recent Labs    02/14/23 1620  HGBA1C 5.8*   Lipid Profile No results for input(s): "CHOL", "HDL", "LDLCALC", "TRIG", "CHOLHDL", "LDLDIRECT" in the last  72 hours. Thyroid function studies No results for input(s): "TSH", "T4TOTAL", "T3FREE", "THYROIDAB" in the last 72 hours.  Invalid input(s): "FREET3" Anemia work up No results for input(s): "VITAMINB12", "FOLATE", "FERRITIN", "TIBC", "IRON", "RETICCTPCT" in the last 72 hours. Urinalysis    Component Value Date/Time   COLORURINE AMBER (A) 01/25/2023 2052   APPEARANCEUR CLOUDY (A) 01/25/2023 2052   APPEARANCEUR Clear 05/20/2021 1414   LABSPEC 1.019 01/25/2023 2052   PHURINE 5.0 01/25/2023 2052   GLUCOSEU NEGATIVE 01/25/2023 2052   HGBUR NEGATIVE 01/25/2023 2052   BILIRUBINUR NEGATIVE 01/25/2023 2052   BILIRUBINUR Negative 05/20/2021 1414   KETONESUR NEGATIVE 01/25/2023 2052   PROTEINUR 100 (A) 01/25/2023 2052   NITRITE NEGATIVE 01/25/2023 2052   LEUKOCYTESUR MODERATE (A) 01/25/2023 2052   Sepsis Labs Recent Labs  Lab 02/14/23 1619 02/15/23 0454 02/16/23 0601  WBC 10.6* 12.4* 10.2   Microbiology No results found for this or any previous visit (from the past 240 hour(s)).   Time coordinating discharge: Over 30 minutes  SIGNED:   Willeen Niece, MD  [facility] 02/16/2023,  10:50 AM Pager   If 7PM-7AM, please contact night-coverage

## 2023-02-16 NOTE — TOC Transition Note (Signed)
Transition of Care [facility]) - CM/SW Discharge Note   Patient Details  Name: George [name] MRN: [id number] Date of Birth: 1932-05-08  [facility] CM/SW Contact:  Allena Katz, LCSW Phone Number: 02/16/2023, 10:53 AM   Clinical Narrative:   Pt has orders to discharge home with [facility] services. Pt already active with [facility] services. Barbara Cower with [facility] notified. CSW signing off.     Final next level of care: Home w Home Health Services Barriers to Discharge: Barriers Resolved   Patient Goals and CMS Choice CMS Medicare.gov Compare Post Acute Care list provided to:: Patient Represenative (must comment) (denise jeffries) Choice offered to / list presented to : [facility] POA / Guardian  Discharge Placement                  Patient to be transferred to facility by: Family      Discharge Plan and Services Additional resources added to the After Visit Summary for                            [facility] Arranged: PT, RN [facility] Agency: [facility] Date HH Agency Contacted: 02/16/23   Representative spoke with at [facility] Agency: Barbara Cower  Social Determinants of Health (SDOH) Interventions SDOH Screenings   Food Insecurity: Patient Unable To Answer (02/15/2023)  Housing: Low Risk  (01/26/2023)  Transportation Needs: No Transportation Needs (01/26/2023)  Utilities: Not At Risk (01/26/2023)  Tobacco Use: High Risk (02/15/2023)     Readmission Risk Interventions    01/28/2023    1:51 PM  Readmission Risk Prevention Plan  Transportation Screening Complete  HRI or Home Care Consult Complete  Palliative Care Screening Complete  Medication Review (RN Care Manager) Complete

## 2023-02-16 NOTE — Care Management Obs Status (Signed)
MEDICARE OBSERVATION STATUS NOTIFICATION   Patient Details  Name: George [name] MRN: [id number] Date of Birth: July 11, 1932   Medicare Observation Status Notification Given:  Yes    Ladona Rosten, LCSW 02/16/2023, 9:01 AM

## 2023-04-26 ENCOUNTER — Ambulatory Visit: Payer: Medicare Other | Admitting: Podiatry

## 2023-04-26 DIAGNOSIS — L603 Nail dystrophy: Secondary | ICD-10-CM | POA: Diagnosis not present

## 2023-04-26 NOTE — Progress Notes (Signed)
Subjective:  Patient ID: George [name], male    DOB: 1932/09/24,  MRN: [id number]  Chief Complaint  Patient presents with   Nail Problem    89 y.o. male presents with the above complaint.  Patient presents with complaint of left hallux dystrophic nail.  Patient states painful to touch progressive gotten worse.  He would like to have it removed.  He would like to make it permanent.  Has been bothering him a lot is causing him a lot of pain especially while ambulating.  He has not seen anyone else prior to seeing me he denies any other acute complaints.   Review of Systems: Negative except as noted in the HPI. Denies N/V/F/Ch.  Past Medical History:  Diagnosis Date   Anemia    Arthritis    BPH (benign prostatic hyperplasia)    Cancer (HCC)    COLON   COPD (chronic obstructive pulmonary disease) (HCC)    Coronary artery disease    WITH 1 STENT   Dyspnea    WITH EXERTION ONLY   GERD (gastroesophageal reflux disease)    HOH (hard of hearing)    Hypercholesteremia    Hypertension    Pneumonia 06/2021   Sleep apnea    HAD UPPP    Current Outpatient Medications:    acidophilus (RISAQUAD) CAPS capsule, Take 2 capsules by mouth daily., Disp: 60 capsule, Rfl: 0   albuterol (PROVENTIL) (2.5 MG/3ML) 0.083% nebulizer solution, Take 2.5 mg by nebulization every 6 (six) hours as needed for wheezing., Disp: , Rfl:    albuterol (VENTOLIN HFA) 108 (90 Base) MCG/ACT inhaler, Inhale 1-2 puffs into the lungs every 6 (six) hours as needed for wheezing or shortness of breath., Disp: , Rfl:    aspirin EC 81 MG tablet, Take 81 mg by mouth daily. Swallow whole., Disp: , Rfl:    colchicine 0.6 MG tablet, Take 0.5 tablets (0.3 mg total) by mouth daily., Disp: , Rfl:    cyanocobalamin (,VITAMIN B-12,) 1000 MCG/ML injection, Inject 1,000 mcg into the muscle every 30 (thirty) days., Disp: , Rfl:    Fluticasone-Umeclidin-Vilant (TRELEGY ELLIPTA) 100-62.5-25 MCG/INH AEPB, Inhale 1 puff into the lungs every  morning., Disp: , Rfl:    HYDROcodone-acetaminophen (NORCO/VICODIN) 5-325 MG tablet, Take 1-2 tablets by mouth every 4 (four) hours as needed for moderate pain., Disp: 30 tablet, Rfl: 0   hyoscyamine (LEVSIN) 0.125 MG tablet, Take 0.125 mg by mouth every 4 (four) hours as needed for cramping., Disp: , Rfl:    omeprazole (PRILOSEC) 20 MG capsule, Take 20 mg by mouth every morning., Disp: , Rfl:    simvastatin (ZOCOR) 20 MG tablet, Take 10 mg by mouth at bedtime., Disp: , Rfl:    traMADol (ULTRAM) 50 MG tablet, Take 50 mg by mouth every 8 (eight) hours as needed for moderate pain., Disp: , Rfl:    verapamil (CALAN-SR) 240 MG CR tablet, Take 240 mg by mouth 2 (two) times daily., Disp: , Rfl:   Social History   Tobacco Use  Smoking Status Every Day   Current packs/day: 1.50   Average packs/day: 1.5 packs/day for 60.0 years (90.0 ttl pk-yrs)   Types: Cigarettes  Smokeless Tobacco Never    No Known Allergies Objective:  There were no vitals filed for this visit. There is no height or weight on file to calculate BMI. Constitutional Well developed. Well nourished.  Vascular Dorsalis pedis pulses palpable bilaterally. Posterior tibial pulses palpable bilaterally. Capillary refill normal to all digits.  No cyanosis  or clubbing noted. Pedal hair growth normal.  Neurologic Normal speech. Oriented to person, place, and time. Epicritic sensation to light touch grossly present bilaterally.  Dermatologic Pain on palpation of the entire/total nail on 1st digit of the left No other open wounds. No skin lesions.  Orthopedic: Normal joint ROM without pain or crepitus bilaterally. No visible deformities. No bony tenderness.   Radiographs: None Assessment:   No diagnosis found.  Plan:  Patient was evaluated and treated and all questions answered.  Nail contusion/dystrophy hallux, left -Patient elects to proceed with minor surgery to remove entire toenail today. Consent reviewed and signed  by patient. -Entire/total nail excised. See procedure note. -Educated on post-procedure care including soaking. Written instructions provided and reviewed. -Patient to follow up in 2 weeks for nail check.  Procedure: Excision of entire/total nail with phenol matricectomy Location: Left 1st toe digit Anesthesia: Lidocaine 1% plain; 1.5 mL and Marcaine 0.5% plain; 1.5 mL, digital block. Skin Prep: Betadine. Dressing: Silvadene; telfa; dry, sterile, compression dressing. Technique: Following skin prep, the toe was exsanguinated and a tourniquet was secured at the base of the toe. The affected nail border was freed and excised.  Phenol matricectomy was performed in standard technique the tourniquet was then removed and sterile dressing applied. Disposition: Patient tolerated procedure well. Patient to return in 2 weeks for follow-up.   No follow-ups on file.

## 2023-05-20 ENCOUNTER — Other Ambulatory Visit: Payer: Self-pay | Admitting: Student

## 2023-05-20 DIAGNOSIS — R911 Solitary pulmonary nodule: Secondary | ICD-10-CM

## 2023-05-25 ENCOUNTER — Ambulatory Visit
Admission: RE | Admit: 2023-05-25 | Discharge: 2023-05-25 | Disposition: A | Discharge status: Home / Self Care | Payer: Medicare Other | Source: Ambulatory Visit | Attending: Student | Admitting: Student

## 2023-05-25 DIAGNOSIS — R911 Solitary pulmonary nodule: Secondary | ICD-10-CM | POA: Diagnosis not present

## 2023-10-16 ENCOUNTER — Emergency Department
Admission: EM | Admit: 2023-10-16 | Discharge: 2023-10-16 | Discharge status: Against Medical Advice | Payer: Medicare Other | Source: Home / Self Care

## 2023-10-16 ENCOUNTER — Emergency Department: Payer: Medicare Other

## 2023-10-16 ENCOUNTER — Other Ambulatory Visit: Payer: Self-pay

## 2023-10-16 DIAGNOSIS — F0390 Unspecified dementia without behavioral disturbance: Secondary | ICD-10-CM | POA: Insufficient documentation

## 2023-10-16 DIAGNOSIS — R059 Cough, unspecified: Secondary | ICD-10-CM | POA: Insufficient documentation

## 2023-10-16 DIAGNOSIS — Z20822 Contact with and (suspected) exposure to covid-19: Secondary | ICD-10-CM | POA: Insufficient documentation

## 2023-10-16 DIAGNOSIS — Z5321 Procedure and treatment not carried out due to patient leaving prior to being seen by health care provider: Secondary | ICD-10-CM | POA: Insufficient documentation

## 2023-10-16 LAB — CBC
HCT: 31.2 % — ABNORMAL LOW (ref 39.0–52.0)
Hemoglobin: 10.8 g/dL — ABNORMAL LOW (ref 13.0–17.0)
MCH: 30.3 pg (ref 26.0–34.0)
MCHC: 34.6 g/dL (ref 30.0–36.0)
MCV: 87.4 fL (ref 80.0–100.0)
Platelets: 370 10*3/uL (ref 150–400)
RBC: 3.57 MIL/uL — ABNORMAL LOW (ref 4.22–5.81)
RDW: 13.6 % (ref 11.5–15.5)
WBC: 27.7 10*3/uL — ABNORMAL HIGH (ref 4.0–10.5)
nRBC: 0 % (ref 0.0–0.2)

## 2023-10-16 LAB — RESP PANEL BY RT-PCR (RSV, FLU A&B, COVID)  RVPGX2
Influenza A by PCR: NEGATIVE
Influenza B by PCR: NEGATIVE
Resp Syncytial Virus by PCR: NEGATIVE
SARS Coronavirus 2 by RT PCR: NEGATIVE

## 2023-10-16 LAB — BASIC METABOLIC PANEL
Anion gap: 14 (ref 5–15)
BUN: 52 mg/dL — ABNORMAL HIGH (ref 8–23)
CO2: 19 mmol/L — ABNORMAL LOW (ref 22–32)
Calcium: 8.5 mg/dL — ABNORMAL LOW (ref 8.9–10.3)
Chloride: 103 mmol/L (ref 98–111)
Creatinine, Ser: 1.73 mg/dL — ABNORMAL HIGH (ref 0.61–1.24)
GFR, Estimated: 37 mL/min — ABNORMAL LOW (ref >60)
Glucose, Bld: 141 mg/dL — ABNORMAL HIGH (ref 70–99)
Potassium: 4.1 mmol/L (ref 3.5–5.1)
Sodium: 136 mmol/L (ref 135–145)

## 2023-10-16 NOTE — ED Triage Notes (Signed)
 Pt comes via EMS from home with c/o cough that is productive and low O2. Pt states he doesn't know why he is here. Pt does have dementia.

## 2023-10-16 NOTE — ED Triage Notes (Signed)
 Arrives from home via [facility]. C/O cough, productive for yellow/brown sputum x 2 days.  RA 92%, but sats dropped to 88 with exertion.  Placed on 2l/ [place] sats 96%.  VS wnl. Hx dementia.

## 2023-10-16 NOTE — ED Triage Notes (Signed)
 Nurse called for lab collect. Told Phlebotomist Robbie Louis) to go draw at 1128. kls

## 2023-10-16 NOTE — ED Notes (Signed)
 Lab called to come and get sampls

## 2023-10-17 ENCOUNTER — Telehealth: Payer: Self-pay | Admitting: Emergency Medicine

## 2023-10-17 NOTE — Telephone Encounter (Signed)
 Called patient due to left emergency department before provider exam to inquire about condition and follow up plans. Spoke with [employer].  She says he has not been eating much, but did eat a bit this am.  I explained that he needs to be seen and examined today due to abnormal labs and xray.  She will try pcp first and then pulmonary.  If they are unable to help him, she will return here.

## 2023-10-18 ENCOUNTER — Other Ambulatory Visit: Payer: Self-pay

## 2023-10-18 ENCOUNTER — Emergency Department: Payer: Medicare Other

## 2023-10-18 ENCOUNTER — Inpatient Hospital Stay
Admission: EM | Admit: 2023-10-18 | Discharge: 2023-10-24 | DRG: 194 | Disposition: A | Discharge status: Home Health | Payer: Medicare Other | Attending: Internal Medicine | Admitting: Internal Medicine

## 2023-10-18 DIAGNOSIS — Z96612 Presence of left artificial shoulder joint: Secondary | ICD-10-CM | POA: Diagnosis present

## 2023-10-18 DIAGNOSIS — D631 Anemia in chronic kidney disease: Secondary | ICD-10-CM | POA: Diagnosis present

## 2023-10-18 DIAGNOSIS — Z1152 Encounter for screening for COVID-19: Secondary | ICD-10-CM | POA: Diagnosis not present

## 2023-10-18 DIAGNOSIS — N1832 Chronic kidney disease, stage 3b: Secondary | ICD-10-CM | POA: Diagnosis present

## 2023-10-18 DIAGNOSIS — I10 Essential (primary) hypertension: Secondary | ICD-10-CM | POA: Diagnosis present

## 2023-10-18 DIAGNOSIS — E1129 Type 2 diabetes mellitus with other diabetic kidney complication: Secondary | ICD-10-CM | POA: Diagnosis present

## 2023-10-18 DIAGNOSIS — I129 Hypertensive chronic kidney disease with stage 1 through stage 4 chronic kidney disease, or unspecified chronic kidney disease: Secondary | ICD-10-CM | POA: Diagnosis present

## 2023-10-18 DIAGNOSIS — J441 Chronic obstructive pulmonary disease with (acute) exacerbation: Secondary | ICD-10-CM | POA: Diagnosis present

## 2023-10-18 DIAGNOSIS — H9190 Unspecified hearing loss, unspecified ear: Secondary | ICD-10-CM

## 2023-10-18 DIAGNOSIS — F0394 Unspecified dementia, unspecified severity, with anxiety: Secondary | ICD-10-CM | POA: Diagnosis present

## 2023-10-18 DIAGNOSIS — E785 Hyperlipidemia, unspecified: Secondary | ICD-10-CM | POA: Diagnosis present

## 2023-10-18 DIAGNOSIS — J440 Chronic obstructive pulmonary disease with acute lower respiratory infection: Secondary | ICD-10-CM | POA: Diagnosis present

## 2023-10-18 DIAGNOSIS — J189 Pneumonia, unspecified organism: Secondary | ICD-10-CM | POA: Diagnosis present

## 2023-10-18 DIAGNOSIS — Z66 Do not resuscitate: Secondary | ICD-10-CM | POA: Diagnosis present

## 2023-10-18 DIAGNOSIS — G4733 Obstructive sleep apnea (adult) (pediatric): Secondary | ICD-10-CM | POA: Diagnosis present

## 2023-10-18 DIAGNOSIS — I2510 Atherosclerotic heart disease of native coronary artery without angina pectoris: Secondary | ICD-10-CM | POA: Diagnosis present

## 2023-10-18 DIAGNOSIS — Z79899 Other long term (current) drug therapy: Secondary | ICD-10-CM

## 2023-10-18 DIAGNOSIS — N400 Enlarged prostate without lower urinary tract symptoms: Secondary | ICD-10-CM | POA: Diagnosis present

## 2023-10-18 DIAGNOSIS — K219 Gastro-esophageal reflux disease without esophagitis: Secondary | ICD-10-CM | POA: Diagnosis present

## 2023-10-18 DIAGNOSIS — Z85038 Personal history of other malignant neoplasm of large intestine: Secondary | ICD-10-CM

## 2023-10-18 DIAGNOSIS — E872 Acidosis, unspecified: Secondary | ICD-10-CM | POA: Diagnosis present

## 2023-10-18 DIAGNOSIS — Z7982 Long term (current) use of aspirin: Secondary | ICD-10-CM | POA: Diagnosis not present

## 2023-10-18 DIAGNOSIS — R531 Weakness: Secondary | ICD-10-CM

## 2023-10-18 DIAGNOSIS — E7800 Pure hypercholesterolemia, unspecified: Secondary | ICD-10-CM | POA: Diagnosis present

## 2023-10-18 DIAGNOSIS — F419 Anxiety disorder, unspecified: Secondary | ICD-10-CM | POA: Diagnosis not present

## 2023-10-18 DIAGNOSIS — E860 Dehydration: Secondary | ICD-10-CM | POA: Diagnosis present

## 2023-10-18 DIAGNOSIS — E1122 Type 2 diabetes mellitus with diabetic chronic kidney disease: Secondary | ICD-10-CM | POA: Diagnosis present

## 2023-10-18 DIAGNOSIS — Z955 Presence of coronary angioplasty implant and graft: Secondary | ICD-10-CM | POA: Diagnosis not present

## 2023-10-18 DIAGNOSIS — F17210 Nicotine dependence, cigarettes, uncomplicated: Secondary | ICD-10-CM | POA: Diagnosis present

## 2023-10-18 DIAGNOSIS — J449 Chronic obstructive pulmonary disease, unspecified: Secondary | ICD-10-CM | POA: Diagnosis not present

## 2023-10-18 DIAGNOSIS — F0390 Unspecified dementia without behavioral disturbance: Secondary | ICD-10-CM | POA: Insufficient documentation

## 2023-10-18 DIAGNOSIS — Z7951 Long term (current) use of inhaled steroids: Secondary | ICD-10-CM

## 2023-10-18 LAB — TROPONIN I (HIGH SENSITIVITY)
Troponin I (High Sensitivity): 26 ng/L — ABNORMAL HIGH (ref <18)
Troponin I (High Sensitivity): 24 ng/L — ABNORMAL HIGH (ref <18)

## 2023-10-18 LAB — CBC
HCT: 33.6 % — ABNORMAL LOW (ref 39.0–52.0)
Hemoglobin: 11.1 g/dL — ABNORMAL LOW (ref 13.0–17.0)
MCH: 30.2 pg (ref 26.0–34.0)
MCHC: 33 g/dL (ref 30.0–36.0)
MCV: 91.3 fL (ref 80.0–100.0)
Platelets: 391 10*3/uL (ref 150–400)
RBC: 3.68 MIL/uL — ABNORMAL LOW (ref 4.22–5.81)
RDW: 13.7 % (ref 11.5–15.5)
WBC: 29.5 10*3/uL — ABNORMAL HIGH (ref 4.0–10.5)
nRBC: 0 % (ref 0.0–0.2)

## 2023-10-18 LAB — URINALYSIS, W/ REFLEX TO CULTURE (INFECTION SUSPECTED)
Bilirubin Urine: NEGATIVE
Glucose, UA: NEGATIVE mg/dL
Hgb urine dipstick: NEGATIVE
Ketones, ur: NEGATIVE mg/dL
Leukocytes,Ua: NEGATIVE
Nitrite: NEGATIVE
Protein, ur: 100 mg/dL — AB
Specific Gravity, Urine: 1.02 (ref 1.005–1.030)
pH: 5 (ref 5.0–8.0)

## 2023-10-18 LAB — LACTIC ACID, PLASMA
Lactic Acid, Venous: 2.5 mmol/L (ref 0.5–1.9)
Lactic Acid, Venous: 1.1 mmol/L (ref 0.5–1.9)
Lactic Acid, Venous: 1 mmol/L (ref 0.5–1.9)

## 2023-10-18 LAB — BASIC METABOLIC PANEL
Anion gap: 11 (ref 5–15)
BUN: 43 mg/dL — ABNORMAL HIGH (ref 8–23)
CO2: 20 mmol/L — ABNORMAL LOW (ref 22–32)
Calcium: 8.4 mg/dL — ABNORMAL LOW (ref 8.9–10.3)
Chloride: 108 mmol/L (ref 98–111)
Creatinine, Ser: 1.55 mg/dL — ABNORMAL HIGH (ref 0.61–1.24)
GFR, Estimated: 43 mL/min — ABNORMAL LOW (ref >60)
Glucose, Bld: 128 mg/dL — ABNORMAL HIGH (ref 70–99)
Potassium: 4.1 mmol/L (ref 3.5–5.1)
Sodium: 139 mmol/L (ref 135–145)

## 2023-10-18 LAB — PROCALCITONIN: Procalcitonin: 0.64 ng/mL

## 2023-10-18 LAB — CBG MONITORING, ED
Glucose-Capillary: 110 mg/dL — ABNORMAL HIGH (ref 70–99)
Glucose-Capillary: 107 mg/dL — ABNORMAL HIGH (ref 70–99)

## 2023-10-18 LAB — PROTIME-INR
INR: 1.3 — ABNORMAL HIGH (ref 0.8–1.2)
Prothrombin Time: 16.2 s — ABNORMAL HIGH (ref 11.4–15.2)

## 2023-10-18 LAB — APTT: aPTT: 33 s (ref 24–36)

## 2023-10-18 MED ORDER — SENNOSIDES-DOCUSATE SODIUM 8.6-50 MG PO TABS: 1.0000 | ORAL_TABLET | Freq: Every evening | ORAL | Status: DC | PRN

## 2023-10-18 MED ORDER — GUAIFENESIN 100 MG/5ML PO LIQD: 5.0000 mL | ORAL | Status: DC | PRN

## 2023-10-18 MED ORDER — ACETAMINOPHEN 325 MG PO TABS
650.0000 mg | ORAL_TABLET | Freq: Four times a day (QID) | ORAL | Status: AC | PRN
Start: 2023-10-18 — End: 2023-10-23
  Administered 2023-10-19 – 2023-10-20 (×2): 650 mg via ORAL
  Filled 2023-10-18 (×2): qty 2

## 2023-10-18 MED ORDER — IPRATROPIUM-ALBUTEROL 0.5-2.5 (3) MG/3ML IN SOLN
3.0000 mL | Freq: Once | RESPIRATORY_TRACT | Status: AC
  Administered 2023-10-18: 3 mL via RESPIRATORY_TRACT
  Filled 2023-10-18: qty 3

## 2023-10-18 MED ORDER — HALOPERIDOL LACTATE 5 MG/ML IJ SOLN
2.0000 mg | Freq: Four times a day (QID) | INTRAMUSCULAR | Status: DC | PRN
  Administered 2023-10-18: 2 mg via INTRAVENOUS
  Filled 2023-10-18: qty 1

## 2023-10-18 MED ORDER — SIMVASTATIN 20 MG PO TABS
10.0000 mg | ORAL_TABLET | Freq: Every day | ORAL | Status: DC
  Administered 2023-10-18 – 2023-10-23 (×6): 10 mg via ORAL
  Filled 2023-10-18 (×6): qty 1

## 2023-10-18 MED ORDER — ONDANSETRON HCL 4 MG/2ML IJ SOLN
4.0000 mg | Freq: Four times a day (QID) | INTRAMUSCULAR | Status: AC | PRN
  Administered 2023-10-20: 4 mg via INTRAVENOUS
  Filled 2023-10-18: qty 2

## 2023-10-18 MED ORDER — ONDANSETRON HCL 4 MG PO TABS: 4.0000 mg | ORAL_TABLET | Freq: Four times a day (QID) | ORAL | Status: AC | PRN

## 2023-10-18 MED ORDER — SODIUM CHLORIDE 0.9 % IV SOLN
500.0000 mg | Freq: Once | INTRAVENOUS | Status: AC
  Administered 2023-10-18: 500 mg via INTRAVENOUS
  Filled 2023-10-18: qty 5

## 2023-10-18 MED ORDER — SODIUM CHLORIDE 0.9 % IV SOLN
500.0000 mg | INTRAVENOUS | Status: DC
  Administered 2023-10-19 – 2023-10-20 (×2): 500 mg via INTRAVENOUS
  Filled 2023-10-18 (×2): qty 5

## 2023-10-18 MED ORDER — ACETAMINOPHEN 650 MG RE SUPP: 650.0000 mg | Freq: Four times a day (QID) | RECTAL | Status: AC | PRN

## 2023-10-18 MED ORDER — INSULIN ASPART 100 UNIT/ML IJ SOLN: 0.0000 [IU] | Freq: Every day | INTRAMUSCULAR | Status: DC

## 2023-10-18 MED ORDER — HYDROCOD POLI-CHLORPHE POLI ER 10-8 MG/5ML PO SUER
5.0000 mL | Freq: Every evening | ORAL | Status: AC | PRN
  Administered 2023-10-19: 5 mL via ORAL
  Filled 2023-10-18: qty 5

## 2023-10-18 MED ORDER — INSULIN ASPART 100 UNIT/ML IJ SOLN
0.0000 [IU] | Freq: Three times a day (TID) | INTRAMUSCULAR | Status: DC
  Administered 2023-10-20: 1 [IU] via SUBCUTANEOUS
  Filled 2023-10-18 (×2): qty 1

## 2023-10-18 MED ORDER — HYDRALAZINE HCL 20 MG/ML IJ SOLN: 5.0000 mg | Freq: Four times a day (QID) | INTRAMUSCULAR | Status: DC | PRN

## 2023-10-18 MED ORDER — SODIUM CHLORIDE 0.9 % IV SOLN
2.0000 g | INTRAVENOUS | Status: AC
  Administered 2023-10-19 – 2023-10-22 (×4): 2 g via INTRAVENOUS
  Filled 2023-10-18 (×4): qty 20

## 2023-10-18 MED ORDER — IPRATROPIUM-ALBUTEROL 0.5-2.5 (3) MG/3ML IN SOLN
3.0000 mL | Freq: Four times a day (QID) | RESPIRATORY_TRACT | Status: AC
  Administered 2023-10-18 – 2023-10-19 (×3): 3 mL via RESPIRATORY_TRACT
  Filled 2023-10-18 (×3): qty 3

## 2023-10-18 MED ORDER — SODIUM CHLORIDE 0.9 % IV SOLN
2.0000 g | Freq: Once | INTRAVENOUS | Status: AC
  Administered 2023-10-18: 2 g via INTRAVENOUS
  Filled 2023-10-18: qty 20

## 2023-10-18 MED ORDER — SODIUM CHLORIDE 0.9 % IV SOLN: Freq: Once | INTRAVENOUS | Status: AC

## 2023-10-18 MED ORDER — LORAZEPAM 2 MG/ML IJ SOLN
0.5000 mg | Freq: Four times a day (QID) | INTRAMUSCULAR | Status: DC | PRN
  Administered 2023-10-18: 0.5 mg via INTRAVENOUS
  Filled 2023-10-18: qty 1

## 2023-10-18 MED ORDER — SODIUM CHLORIDE 0.9 % IV BOLUS
1000.0000 mL | Freq: Once | INTRAVENOUS | Status: AC
  Administered 2023-10-18: 1000 mL via INTRAVENOUS

## 2023-10-18 MED ORDER — ENOXAPARIN SODIUM 40 MG/0.4ML IJ SOSY
40.0000 mg | PREFILLED_SYRINGE | INTRAMUSCULAR | Status: DC
  Administered 2023-10-18 – 2023-10-23 (×6): 40 mg via SUBCUTANEOUS
  Filled 2023-10-18 (×6): qty 0.4

## 2023-10-18 NOTE — Assessment & Plan Note (Signed)
 Bilateral Moderate to severe

## 2023-10-18 NOTE — ED Notes (Signed)
 Patient changed from wet brief. Clean chucks and brief provided. Fluids provided and patient repositioned. CB in reach. Blanket provided. Heat increased in room per request.

## 2023-10-18 NOTE — ED Notes (Signed)
 Fluids almost completed. Patient asleep resting RR:13, O2:100% @2L  [place]. Repositioned patient arm to allow fluids to flow better. Collecting Lactic after completion of fluid bolus.

## 2023-10-18 NOTE — ED Notes (Signed)
 Called lab to add procalcitonin off previous collection. They will call back if unable to do so.

## 2023-10-18 NOTE — ED Notes (Signed)
 Patient attempting to climb out of the bed multiple times. RN re-orienting patient to situation. Patient continues to yell "Come here and I need water, I need water." "Hey, Hey". MD Cox aware.

## 2023-10-18 NOTE — Assessment & Plan Note (Addendum)
 Insulin SSI with at bedtime coverage, renal dosing ordered Goal inpatient blood glucose level of 140-180

## 2023-10-18 NOTE — ED Triage Notes (Signed)
 Pt to ED with caregiver from PCP for PNA. LWBS on 1/5, PCP wanted pt to come to ED For admission d/t elevated white count. Pt has not started antibiotics for PNA RR even and unlabored.

## 2023-10-18 NOTE — Assessment & Plan Note (Signed)
 Baseline unknown Fall and aspiration precaution

## 2023-10-18 NOTE — ED Notes (Signed)
 Applied arm board to keep patients arm straight, to allow fluid bolus to be administered and to allow antibiotic to be administered. Patient not complying with staff to keep arm straight. Re-timed Lactic to 1500 to allow fluid bolus to go in.

## 2023-10-18 NOTE — Assessment & Plan Note (Signed)
Hydralazine 5 mg IV every 6 hours as needed for SBP greater 170, 5 days ordered

## 2023-10-18 NOTE — Assessment & Plan Note (Signed)
 With mild exacerbation DuoNebs 4 times daily, 3 additional doses ordered

## 2023-10-18 NOTE — ED Provider Notes (Signed)
 [facility] Provider Note    Event Date/Time   First MD Initiated Contact with Patient 10/18/23 1226     (approximate)   History   Pneumonia   HPI  George [name] is a 89 y.o. male with a history of COPD, CAD, dementia who presents with shortness of breath.  Patient was here 2 days ago but left from the waiting room.  Complains of mild shortness of breath and cough and fatigue     Physical Exam   Triage Vital Signs: ED Triage Vitals [10/18/23 1131]  Encounter Vitals Group     BP (!) 104/57     Systolic BP Percentile      Diastolic BP Percentile      Pulse Rate 87     Resp 20     Temp 98.1 F (36.7 C)     Temp src      SpO2 93 %     Weight 74 kg (163 lb 2.3 oz)     Height 1.829 m (6')     Head Circumference      Peak Flow      Pain Score 0     Pain Loc      Pain Education      Exclude from Growth Chart     Most recent vital signs: Vitals:   10/18/23 1230 10/18/23 1300  BP: 107/60 121/62  Pulse: 72 80  Resp: (!) 22 18  Temp:    SpO2: 98% 96%     General: Awake, no distress.  CV:  Good peripheral perfusion.  Resp:  Normal effort.  On 2 L nasal cannula Abd:  No distention.  Other:     ED Results / Procedures / Treatments   Labs (all labs ordered are listed, but only abnormal results are displayed) Labs Reviewed  CBC - Abnormal; Notable for the following components:      Result Value   WBC 29.5 (*)    RBC 3.68 (*)    Hemoglobin 11.1 (*)    HCT 33.6 (*)    All other components within normal limits  BASIC METABOLIC PANEL - Abnormal; Notable for the following components:   CO2 20 (*)    Glucose, Bld 128 (*)    BUN 43 (*)    Creatinine, Ser 1.55 (*)    Calcium  8.4 (*)    GFR, Estimated 43 (*)    All other components within normal limits  LACTIC ACID, PLASMA - Abnormal; Notable for the following components:   Lactic Acid, Venous 2.5 (*)    All other components within normal limits  PROTIME-INR - Abnormal; Notable for  the following components:   Prothrombin Time 16.2 (*)    INR 1.3 (*)    All other components within normal limits  URINALYSIS, W/ REFLEX TO CULTURE (INFECTION SUSPECTED) - Abnormal; Notable for the following components:   Color, Urine AMBER (*)    APPearance HAZY (*)    Protein, ur 100 (*)    Bacteria, UA RARE (*)    All other components within normal limits  CULTURE, BLOOD (ROUTINE X 2)  CULTURE, BLOOD (ROUTINE X 2)  APTT  LACTIC ACID, PLASMA  TROPONIN I (HIGH SENSITIVITY)     EKG  ED ECG REPORT I, Lamar Price, the attending physician, personally viewed and interpreted this ECG.  Date: 10/18/2023  Rhythm: normal sinus rhythm QRS Axis: normal Intervals: normal ST/T Wave abnormalities: Nonspecific changes Narrative Interpretation: no evidence of acute ischemia  RADIOLOGY Chest x-ray suspicious for pneumonia multilobar    PROCEDURES:  Critical Care performed: yes  CRITICAL CARE Performed by: Lamar Price   Total critical care time: 30 minutes  Critical care time was exclusive of separately billable procedures and treating other patients.  Critical care was necessary to treat or prevent imminent or life-threatening deterioration.  Critical care was time spent personally by me on the following activities: development of treatment plan with patient and/or surrogate as well as nursing, discussions with consultants, evaluation of patient's response to treatment, examination of patient, obtaining history from patient or surrogate, ordering and performing treatments and interventions, ordering and review of laboratory studies, ordering and review of radiographic studies, pulse oximetry and re-evaluation of patient's condition.   Procedures   MEDICATIONS ORDERED IN ED: Medications  azithromycin  (ZITHROMAX ) 500 mg in sodium chloride  0.9 % 250 mL IVPB (has no administration in time range)  simvastatin  (ZOCOR ) tablet 10 mg (has no administration in time range)   acetaminophen  (TYLENOL ) tablet 650 mg (has no administration in time range)    Or  acetaminophen  (TYLENOL ) suppository 650 mg (has no administration in time range)  ondansetron  (ZOFRAN ) tablet 4 mg (has no administration in time range)    Or  ondansetron  (ZOFRAN ) injection 4 mg (has no administration in time range)  enoxaparin  (LOVENOX ) injection 40 mg (has no administration in time range)  cefTRIAXone  (ROCEPHIN ) 2 g in sodium chloride  0.9 % 100 mL IVPB (has no administration in time range)  azithromycin  (ZITHROMAX ) 500 mg in sodium chloride  0.9 % 250 mL IVPB (has no administration in time range)  senna-docusate (Senokot-S) tablet 1 tablet (has no administration in time range)  ipratropium-albuterol  (DUONEB) 0.5-2.5 (3) MG/3ML nebulizer solution 3 mL (has no administration in time range)  sodium chloride  0.9 % bolus 1,000 mL (has no administration in time range)  LORazepam  (ATIVAN ) injection 0.5 mg (0.5 mg Intravenous Given 10/18/23 1340)  haloperidol  lactate (HALDOL ) injection 2 mg (2 mg Intravenous Given 10/18/23 1338)  guaiFENesin  (ROBITUSSIN) 100 MG/5ML liquid 5 mL (has no administration in time range)  chlorpheniramine-HYDROcodone  (TUSSIONEX) 10-8 MG/5ML suspension 5 mL (has no administration in time range)  cefTRIAXone  (ROCEPHIN ) 2 g in sodium chloride  0.9 % 100 mL IVPB (0 g Intravenous Stopped 10/18/23 1338)  0.9 %  sodium chloride  infusion ( Intravenous New Bag/Given 10/18/23 1301)  ipratropium-albuterol  (DUONEB) 0.5-2.5 (3) MG/3ML nebulizer solution 3 mL (3 mLs Nebulization Given 10/18/23 1304)  ipratropium-albuterol  (DUONEB) 0.5-2.5 (3) MG/3ML nebulizer solution 3 mL (3 mLs Nebulization Given 10/18/23 1304)     IMPRESSION / MDM / ASSESSMENT AND PLAN / ED COURSE  I reviewed the triage vital signs and the nursing notes. Patient's presentation is most consistent with acute presentation with potential threat to life or bodily function.  Patient presents with cough, shortness of breath, fatigue  and weakness.  He is requiring 2 L nasal cannula supplemental oxygen.  Notably his lab work demonstrates a white blood cell count of 29.5.  Chest x-ray is concerning for multi lobar pneumonia.  Will give IV fluids the patient is clinically dehydrated  Will add on lactic, blood cultures, start IV Rocephin  and IV azithromycin   Lactic acid is elevated at 2.5.  Have discussed with Dr. Sherre of the hospitalist team for admission        FINAL CLINICAL IMPRESSION(S) / ED DIAGNOSES   Final diagnoses:  Pneumonia due to infectious organism, unspecified laterality, unspecified part of lung     Rx / DC Orders   ED  Discharge Orders     None        Note:  This document was prepared using Dragon voice recognition software and may include unintentional dictation errors.   Arlander Charleston, MD 10/18/23 1346

## 2023-10-18 NOTE — Assessment & Plan Note (Addendum)
 Check procalcitonin on admission Azithromycin  500 mg IV daily, ceftriaxone  2 g IV daily, to complete a 5-day course Spirometry, flutter valve Guaifenesin  5 mL p.o. every 6 hours as needed to loosen phlegm, 3 days ordered Tussionex 5 mL p.o. nightly as needed for cough, 2 days ordered Continuous pulse oximetry ordered Admit to telemetry cardiac, inpatient

## 2023-10-18 NOTE — Assessment & Plan Note (Signed)
Simvastatin 10 mg nightly resumed

## 2023-10-18 NOTE — ED Notes (Signed)
 Put patient on 2L [place] to sustain saturations I the 90's. Patient was in the mid 80s on room air.

## 2023-10-18 NOTE — Assessment & Plan Note (Signed)
 Caregiver notices about Friday of the onset of worsening cough PT, OT consulted

## 2023-10-18 NOTE — ED Notes (Signed)
 Pt was getting OOB. Bed alarm was on before but must have been turned off. Helped pt stand to urinate. Back in bed, bed alarm on and other high fall risk measures in place.

## 2023-10-18 NOTE — ED Notes (Signed)
Pt not in room yet.

## 2023-10-18 NOTE — H&P (Addendum)
 History and Physical   George [name] FMW:[id number] DOB: May 14, 1932 DOA: 10/18/2023  PCP: George Lenis, MD  Outpatient Specialists: Dr. Theotis [name] clinic pulmonology Patient coming from: PCP via caregiver  I have personally briefly reviewed patient's old medical records in [facility] EMR.  Chief Concern: Cough, shortness of breath  HPI: Mr. George [name] is a 89 year old male with history of dementia, CKD stage IIIb baseline, GERD, hypertension, hyperlipidemia, CAD, who presents to the emergency department for chief concerns of cough, shortness of breath.  Vitals in the ED showed temperature 98.1, respiration rate 18, heart rate 75, blood pressure 105/49, improved to 121/62, SpO2 95% on room air.  Serum sodium is 139, potassium 4.1, chloride 108, bicarb 20, BUN of 43, serum creatinine 1.55, EGFR 43, nonfasting blood glucose 128, WBC 29.5, hemoglobin 11.1, platelets of 391.  UA has been ordered and is pending collection.  Blood cultures x 2 are in process.  ED treatment: DuoNebs 2 treatments were given, azithromycin  500 mg IV one-time dose, ceftriaxone  2 g IV one-time dose. ----------------------------- At bedside, patient with able to tell me his name and he knows he is in the hospital after looking around.  He was not able to tell me his current age, month, current calendar year.  He denies being in pain at this time.  Primary caregiver, Ms. George [name] noticed the cough that was so bad, started on Friday, 10/14/23. He had difficulty eating due to the cough. She reports the cough is productive. This time the cough associated with shortness of breath. She denies fever, vomiting.  The O2 dropped as low as 91%. He does not wear O2 at baseline.   He uses the restroom on his own with the cane to his back without difficulty at baseline. She noticed that the has been more weak than normal , requiring assistance with coming back from restroom.  Social history: He lives at with Ms.  George [name] who is a LAWYER. He smokes 1 - 1.5 ppd per day. He does not consume etoh.  Patient currently does not have family.  He has been estranged from his son for greater than 10 years.  George [name] is the healthcare power of attorney and primary caregiver at this time.  ROS: Unable to complete due to patient has dementia and acuity of presentation.  ED Course: Discussed with EDP, patient requiring hospitalization for chief concerns of CAP.  Assessment/Plan  Principal Problem:   Community acquired pneumonia Active Problems:   COPD (chronic obstructive pulmonary disease) (HCC)   Type II diabetes mellitus with renal manifestations (HCC)   CKD stage 3b, GFR 30-44 ml/min (HCC)   DNR (do not resuscitate)   CAD (coronary artery disease)   Hyperlipidemia   Hypertension   Anxiety   Dementia (HCC)   Weakness   Hearing loss   Assessment and Plan:  * Community acquired pneumonia Check procalcitonin on admission Azithromycin  500 mg IV daily, ceftriaxone  2 g IV daily, to complete a 5-day course Spirometry, flutter valve Guaifenesin  5 mL p.o. every 6 hours as needed to loosen phlegm, 3 days ordered Tussionex 5 mL p.o. nightly as needed for cough, 2 days ordered Continuous pulse oximetry ordered Admit to telemetry cardiac, inpatient  Type II diabetes mellitus with renal manifestations (HCC) Insulin  SSI with at bedtime coverage, renal dosing ordered Goal inpatient blood glucose level of 140-180  COPD (chronic obstructive pulmonary disease) (HCC) With mild exacerbation DuoNebs 4 times daily, 3 additional doses ordered  Hearing loss Bilateral Moderate to  severe  Weakness Caregiver notices about Friday of the onset of worsening cough PT, OT consulted  Dementia (HCC) Baseline unknown Fall and aspiration precaution  Anxiety Complicated by baseline dementia Ativan  0.5 mg IV every 6 hours as needed for anxiety, 12 hours ordered Haldol  2 mg IV every 6 hours as needed  agitation, 12 hours ordered  Hypertension Hydralazine  5 mg IV every 6 hours as needed for SBP greater 170, 5 days ordered  Hyperlipidemia Simvastatin  10 mg nightly resumed  Chart reviewed.   DVT prophylaxis: 40 mg subcutaneous for 24 hours Code Status: DNR/DNI Diet: heart healthy Family Communication: Updated primary healthcare for returning and caregiver Ms. George [name] Disposition Plan: Pending clinical course Consults called: PT, OT to assess patient on 1/8 Admission status: Telemetry cardiac, inpatient  Past Medical History:  Diagnosis Date   Anemia    Arthritis    BPH (benign prostatic hyperplasia)    Cancer (HCC)    COLON   COPD (chronic obstructive pulmonary disease) (HCC)    Coronary artery disease    WITH 1 STENT   Dyspnea    WITH EXERTION ONLY   GERD (gastroesophageal reflux disease)    HOH (hard of hearing)    Hypercholesteremia    Hypertension    Pneumonia 06/2021   Sleep apnea    HAD UPPP   Past Surgical History:  Procedure Laterality Date   CARDIAC CATHETERIZATION     has a stent   COLON SURGERY     DUE TO COLON CANCER   COLONOSCOPY     MULTIPLE   EYE SURGERY Bilateral    CATARACT   HERNIA REPAIR     HOLEP-LASER ENUCLEATION OF THE PROSTATE WITH MORCELLATION N/A 08/21/2021   Procedure: HOLEP-LASER ENUCLEATION OF THE PROSTATE WITH MORCELLATION;  Surgeon: George Redell BROCKS, MD;  Location: [facility] ORS;  Service: Urology;  Laterality: N/A;   REVERSE SHOULDER ARTHROPLASTY Left 02/10/2021   Procedure: REVERSE SHOULDER ARTHROPLASTY;  Surgeon: George Norleen PARAS, MD;  Location: [facility] ORS;  Service: Orthopedics;  Laterality: Left;   TONSILLECTOMY     UVULOPALATOPHARYNGOPLASTY     Social History:  reports that he has been smoking cigarettes. He has a 90 pack-year smoking history. He has never used smokeless tobacco. He reports that he does not currently use alcohol. He reports that he does not use drugs.  No Known Allergies Family History  Problem Relation Age of Onset    Prostate cancer Neg Hx    Bladder Cancer Neg Hx    Kidney cancer Neg Hx    Family history: Family history reviewed and not pertinent.  Prior to Admission medications   Medication Sig Start Date End Date Taking? Authorizing Provider  acidophilus (RISAQUAD) CAPS capsule Take 2 capsules by mouth daily. 01/30/23   Fausto Sor A, DO  albuterol  (PROVENTIL ) (2.5 MG/3ML) 0.083% nebulizer solution Take 2.5 mg by nebulization every 6 (six) hours as needed for wheezing. 01/20/23 01/20/24  [provider]  albuterol  (VENTOLIN  HFA) 108 (90 Base) MCG/ACT inhaler Inhale 1-2 puffs into the lungs every 6 (six) hours as needed for wheezing or shortness of breath.    [provider]  aspirin  EC 81 MG tablet Take 81 mg by mouth daily. Swallow whole.    [provider]  colchicine  0.6 MG tablet Take 0.5 tablets (0.3 mg total) by mouth daily. 06/17/21   Awanda City, MD  cyanocobalamin  (,VITAMIN B-12,) 1000 MCG/ML injection Inject 1,000 mcg into the muscle every 30 (thirty) days.    [provider]  Fluticasone -Umeclidin-Vilant (TRELEGY ELLIPTA) 100-62.5-25 MCG/INH AEPB Inhale 1 puff into the lungs every morning.    [provider]  HYDROcodone -acetaminophen  (NORCO/VICODIN) 5-325 MG tablet Take 1-2 tablets by mouth every 4 (four) hours as needed for moderate pain. 01/29/23   Fausto Burnard LABOR, DO  hyoscyamine  (LEVSIN ) 0.125 MG tablet Take 0.125 mg by mouth every 4 (four) hours as needed for cramping.    [provider]  omeprazole (PRILOSEC) 20 MG capsule Take 20 mg by mouth every morning.    [provider]  simvastatin  (ZOCOR ) 20 MG tablet Take 10 mg by mouth at bedtime.    [provider]  traMADol  (ULTRAM ) 50 MG tablet Take 50 mg by mouth every 8 (eight) hours as needed for moderate pain. 01/18/23   [provider]  verapamil  (CALAN -SR) 240 MG CR tablet Take 240 mg by mouth 2 (two) times daily.    [provider]   Physical  Exam: Vitals:   10/18/23 1330 10/18/23 1400 10/18/23 1430 10/18/23 1529  BP: 94/82 (!) 151/64 (!) 121/57   Pulse: 94 81 73   Resp: (!) 25 (!) 22 20   Temp:    98 F (36.7 C)  TempSrc:    Oral  SpO2: 94% 96% 100%   Weight:      Height:       Constitutional: appears frail, age appropriate, calm Eyes: PERRL, lids and conjunctivae normal ENMT: Mucous membranes are dry. Posterior pharynx clear of any exudate or lesions. Age-appropriate dentition.  Bilateral hearing loss Neck: normal, supple, no masses, no thyromegaly Respiratory: clear to auscultation bilaterally, no wheezing, no crackles. Normal respiratory effort. No accessory muscle use.  Nasal cannula in place Cardiovascular: Regular rate and rhythm, no murmurs / rubs / gallops. No extremity edema. 2+ pedal pulses. No carotid bruits.  Abdomen: no tenderness, no masses palpated, no hepatosplenomegaly. Bowel sounds positive.  Musculoskeletal: no clubbing / cyanosis. No joint deformity upper and lower extremities. Good ROM, no contractures, no atrophy. Normal muscle tone.  Skin: no rashes, lesions, ulcers. No induration Neurologic: Sensation intact. Strength 5/5 in all 4.  Psychiatric: Normal judgment and insight. Alert and oriented x 3. Normal mood.   EKG: independently reviewed, showing sinus rhythm with rate of 95, QTc 499, right bundle branch block  Chest x-ray on Admission: I personally reviewed and I agree with radiologist reading as below.  DG Chest 2 View Result Date: 10/18/2023 CLINICAL DATA:  89 year old male with cough and leukocytosis. EXAM: CHEST - 2 VIEW COMPARISON:  Chest radiographs 10/16/2023 and earlier. FINDINGS: Seated AP and lateral views of chest 1148 hours. Underlying emphysema demonstrated by CT in August. Left upper lung architectural distortion appears grossly stable since that time. Patchy bilateral lower lung opacity persists, more confluent on the right. Some of this appears to be middle lobe involvement on the  lateral. No pleural effusion. Mediastinal contours remain within normal limits. No pulmonary edema or pneumothorax. Left shoulder arthroplasty. No acute osseous abnormality identified. Negative visible bowel gas. IMPRESSION: Chronic lung disease with Emphysema (ICD10-J43.9). Patchy opacity now in both lower lungs suspicious for acute infectious exacerbation. No pleural effusion. Electronically Signed   By: VEAR Hurst M.D.   On: 10/18/2023 12:08   Labs on Admission: I have personally reviewed following labs  CBC: Recent Labs  Lab 10/16/23 1212 10/18/23 1133  WBC 27.7* 29.5*  HGB 10.8* 11.1*  HCT 31.2* 33.6*  MCV 87.4 91.3  PLT 370 391   Basic Metabolic Panel: Recent Labs  Lab 10/16/23 1212 10/18/23 1133  NA 136 139  K 4.1 4.1  CL 103 108  CO2 19* 20*  GLUCOSE 141* 128*  BUN 52* 43*  CREATININE 1.73* 1.55*  CALCIUM  8.5* 8.4*   GFR: Estimated Creatinine Clearance: 33.8 mL/min (A) (by C-G formula based on SCr of 1.55 mg/dL (H)).  Coagulation Profile: Recent Labs  Lab 10/18/23 1237  INR 1.3*   Urine analysis:    Component Value Date/Time   COLORURINE AMBER (A) 10/18/2023 1237   APPEARANCEUR HAZY (A) 10/18/2023 1237   APPEARANCEUR Clear 05/20/2021 1414   LABSPEC 1.020 10/18/2023 1237   PHURINE 5.0 10/18/2023 1237   GLUCOSEU NEGATIVE 10/18/2023 1237   HGBUR NEGATIVE 10/18/2023 1237   BILIRUBINUR NEGATIVE 10/18/2023 1237   BILIRUBINUR Negative 05/20/2021 1414   KETONESUR NEGATIVE 10/18/2023 1237   PROTEINUR 100 (A) 10/18/2023 1237   NITRITE NEGATIVE 10/18/2023 1237   LEUKOCYTESUR NEGATIVE 10/18/2023 1237   This document was prepared using Dragon Voice Recognition software and may include unintentional dictation errors.  Dr. Sherre [facility]  If 7PM-7AM, please contact overnight-coverage provider If 7AM-7PM, please contact day attending provider [email or web address]  10/18/2023, 3:44 PM

## 2023-10-18 NOTE — ED Notes (Signed)
Dr. Cox at bedside.  

## 2023-10-18 NOTE — Assessment & Plan Note (Signed)
 Complicated by baseline dementia Ativan 0.5 mg IV every 6 hours as needed for anxiety, 12 hours ordered Haldol 2 mg IV every 6 hours as needed agitation, 12 hours ordered

## 2023-10-18 NOTE — Hospital Course (Addendum)
 Mr. George [name] is a 89 year old male with history of dementia, CKD stage IIIb baseline, GERD, hypertension, hyperlipidemia, CAD, who presents to the emergency department for chief concerns of cough, shortness of breath.  Vitals in the ED showed temperature 98.1, respiration rate 18, heart rate 75, blood pressure 105/49, improved to 121/62, SpO2 95% on room air.  Serum sodium is 139, potassium 4.1, chloride 108, bicarb 20, BUN of 43, serum creatinine 1.55, EGFR 43, nonfasting blood glucose 128, WBC 29.5, hemoglobin 11.1, platelets of 391.  UA has been ordered and is pending collection.  Blood cultures x 2 are in process.  Patient was started on ceftriaxone  and Zithromax  for concern of pneumonia.  Procalcitonin was elevated.  1/10: Vitals and labs stable, iron studies with low iron and TIBC, elevated ferritin, more consistent with anemia of chronic disease.  A1c of 5.8.  Slight worsening of leukocytosis at 24.3, remained on 3 L of oxygen. PT with no follow-up recommendation, patient has 24/7 care available but apparently his caregiver is sick today.  1/11: Remained afebrile but worsening procalcitonin at 1.04 and leukocytosis at 27.3, slight increase in creatinine.  MRSA PCR negative, giving some IV  fluid and Switching antibiotics to cefepime .  Repeat chest x-ray with basal pneumonia and small pleural effusions. Will try to get sputum culture, unable to obtain any sputum.  1/12: Worsening leukocytosis, stable procalcitonin.  Smear with some toxic granulations.  Oncology was also consulted.  Continuing current management.  They ordered few labs and they will discuss results as outpatient.  1/13: Some improvement in leukocytosis, remained hemodynamically stable.  Oncology ordered BCR-ABL1 FISH and flow cytometry.  Patient overall stable and wants to go home.  He is being discharged on 1 more doses of Levaquin  which he will take on 10/26/2023.  Patient will follow-up with oncology for lab  results.  He will continue on current management and need to have a close follow-up with his providers for further management.

## 2023-10-18 NOTE — ED Notes (Signed)
 Arm board was placed and arm propped up, pt cannot remember and keeps bending arm.

## 2023-10-19 DIAGNOSIS — J189 Pneumonia, unspecified organism: Secondary | ICD-10-CM | POA: Diagnosis not present

## 2023-10-19 LAB — BASIC METABOLIC PANEL
Anion gap: 11 (ref 5–15)
BUN: 31 mg/dL — ABNORMAL HIGH (ref 8–23)
CO2: 21 mmol/L — ABNORMAL LOW (ref 22–32)
Calcium: 8.2 mg/dL — ABNORMAL LOW (ref 8.9–10.3)
Chloride: 108 mmol/L (ref 98–111)
Creatinine, Ser: 1.21 mg/dL (ref 0.61–1.24)
GFR, Estimated: 57 mL/min — ABNORMAL LOW (ref >60)
Glucose, Bld: 91 mg/dL (ref 70–99)
Potassium: 3.9 mmol/L (ref 3.5–5.1)
Sodium: 140 mmol/L (ref 135–145)

## 2023-10-19 LAB — CBC WITH DIFFERENTIAL/PLATELET
Abs Immature Granulocytes: 0.21 10*3/uL — ABNORMAL HIGH (ref 0.00–0.07)
Basophils Absolute: 0.1 10*3/uL (ref 0.0–0.1)
Basophils Relative: 0 %
Eosinophils Absolute: 0 10*3/uL (ref 0.0–0.5)
Eosinophils Relative: 0 %
HCT: 30.8 % — ABNORMAL LOW (ref 39.0–52.0)
Hemoglobin: 9.9 g/dL — ABNORMAL LOW (ref 13.0–17.0)
Immature Granulocytes: 1 %
Lymphocytes Relative: 5 %
Lymphs Abs: 1.3 10*3/uL (ref 0.7–4.0)
MCH: 29.6 pg (ref 26.0–34.0)
MCHC: 32.1 g/dL (ref 30.0–36.0)
MCV: 92.2 fL (ref 80.0–100.0)
Monocytes Absolute: 1.3 10*3/uL — ABNORMAL HIGH (ref 0.1–1.0)
Monocytes Relative: 5 %
Neutro Abs: 23.5 10*3/uL — ABNORMAL HIGH (ref 1.7–7.7)
Neutrophils Relative %: 89 %
Platelets: 382 10*3/uL (ref 150–400)
RBC: 3.34 MIL/uL — ABNORMAL LOW (ref 4.22–5.81)
RDW: 14 % (ref 11.5–15.5)
Smear Review: NORMAL
WBC Morphology: INCREASED
WBC: 26.4 10*3/uL — ABNORMAL HIGH (ref 4.0–10.5)
nRBC: 0 % (ref 0.0–0.2)

## 2023-10-19 LAB — CBC
HCT: 29.9 % — ABNORMAL LOW (ref 39.0–52.0)
Hemoglobin: 9.8 g/dL — ABNORMAL LOW (ref 13.0–17.0)
MCH: 29.7 pg (ref 26.0–34.0)
MCHC: 32.8 g/dL (ref 30.0–36.0)
MCV: 90.6 fL (ref 80.0–100.0)
Platelets: 366 10*3/uL (ref 150–400)
RBC: 3.3 MIL/uL — ABNORMAL LOW (ref 4.22–5.81)
RDW: 13.8 % (ref 11.5–15.5)
WBC: 25.9 10*3/uL — ABNORMAL HIGH (ref 4.0–10.5)
nRBC: 0 % (ref 0.0–0.2)

## 2023-10-19 LAB — CBG MONITORING, ED
Glucose-Capillary: 79 mg/dL (ref 70–99)
Glucose-Capillary: 94 mg/dL (ref 70–99)
Glucose-Capillary: 87 mg/dL (ref 70–99)

## 2023-10-19 LAB — GLUCOSE, CAPILLARY
Glucose-Capillary: 71 mg/dL (ref 70–99)
Glucose-Capillary: 85 mg/dL (ref 70–99)

## 2023-10-19 LAB — PROCALCITONIN: Procalcitonin: 0.53 ng/mL

## 2023-10-19 MED ORDER — IPRATROPIUM-ALBUTEROL 0.5-2.5 (3) MG/3ML IN SOLN
3.0000 mL | RESPIRATORY_TRACT | Status: DC | PRN
  Administered 2023-10-19 – 2023-10-20 (×2): 3 mL via RESPIRATORY_TRACT
  Filled 2023-10-19 (×2): qty 3

## 2023-10-19 MED ORDER — DM-GUAIFENESIN ER 30-600 MG PO TB12
1.0000 | ORAL_TABLET | Freq: Two times a day (BID) | ORAL | Status: AC
  Administered 2023-10-19 – 2023-10-21 (×5): 1 via ORAL
  Filled 2023-10-19 (×6): qty 1

## 2023-10-19 MED ORDER — NICOTINE 14 MG/24HR TD PT24
14.0000 mg | MEDICATED_PATCH | Freq: Every day | TRANSDERMAL | Status: DC
  Administered 2023-10-20 – 2023-10-24 (×5): 14 mg via TRANSDERMAL
  Filled 2023-10-19 (×6): qty 1

## 2023-10-19 MED ORDER — VERAPAMIL HCL ER 240 MG PO TBCR
240.0000 mg | EXTENDED_RELEASE_TABLET | Freq: Two times a day (BID) | ORAL | Status: DC
  Administered 2023-10-19 – 2023-10-24 (×10): 240 mg via ORAL
  Filled 2023-10-19 (×12): qty 1

## 2023-10-19 NOTE — Progress Notes (Addendum)
 Progress Note   Patient: George [name] FMW:[id number] DOB: 1932-03-25 DOA: 10/18/2023     1 DOS: the patient was seen and examined on 10/19/2023   Brief hospital course: Mr. Ojani Berenson is a 89 year old male with history of dementia, CKD stage IIIb baseline, GERD, hypertension, hyperlipidemia, CAD, who presents to the emergency department for chief concerns of cough, shortness of breath.  Vitals in the ED showed temperature 98.1, respiration rate 18, heart rate 75, blood pressure 105/49, improved to 121/62, SpO2 95% on room air.  Serum sodium is 139, potassium 4.1, chloride 108, bicarb 20, BUN of 43, serum creatinine 1.55, EGFR 43, nonfasting blood glucose 128, WBC 29.5, hemoglobin 11.1, platelets of 391.  UA has been ordered and is pending collection.  Blood cultures x 2 are in process.  ED treatment: DuoNebs 2 treatments were given, azithromycin  500 mg IV one-time dose, ceftriaxone  2 g IV one-time dose.  Assessment and Plan: Community acquired pneumonia COPD exacerbation mild  CXR suspicious for pneumonia  Leukocytosis with left shift  pro calcitonin elevated on admission labs  Started Azithromycin  500 mg IV daily, ceftriaxone  2 g IV daily, to complete a 5-day course - Continue ceftriaxone  and azithromycin   -Spirometry, flutter valve -continue Guaifenesin  5 mL p.o. every 6 hours as needed to loosen phlegm, 3 days ordered -Continue Tussionex 5 mL p.o. nightly as needed for cough, 2 days ordered   Type II diabetes mellitus with renal manifestations (HCC) Well controlled. Insulin  SSI with at bedtime coverage, renal dosing ordered Goal inpatient blood glucose level of 140-180   Mild metabolic acidosis (Resolving) Patient with mild elevated anion gap and elevated lactate on admission though not at threshold for lactic acidosis. Also noted to have acute worsening in renal function. The latter is likely primary driver in lab abnormality. This is resolving with improvement in  patient's renal function.    Hearing loss Bilateral Moderate to severe  Weakness Caregiver notice weakness around the onset of worsening cough about 5d prior to admission PT, OT recommending continued assistance at home   Dementia [facility]) Baseline unknown Fall and aspiration precautions  Anxiety Complicated by baseline dementia If becomes agitated will start night time seroquel   Hypertension Resume home verapamil    Hyperlipidemia Simvastatin  10 mg nightly resumed   CKD3a/3b Stable.      Subjective: Difficult to communicate as he does not have his hearing aids but able to read written note and says his shortness of breath is better. Continues to have a cough but is unsure if this is improved. States he cannot remember.   Physical Exam: Vitals:   10/19/23 1330 10/19/23 1400 10/19/23 1430 10/19/23 1551  BP: (!) 152/73 (!) 150/73 (!) 163/78   Pulse: 79 83 96   Resp: 19 (!) 25 (!) 23   Temp:    98.5 F (36.9 C)  TempSrc:    Oral  SpO2: 99% 97% 100%   Weight:      Height:       Physical Exam  Constitutional: In no distress.  Cardiovascular: Normal rate, regular rhythm. No lower extremity edema  Pulmonary: Non labored breathing on [place], no wheezing or rales.   Abdominal: Soft. Normal bowel sounds. Non distended and non tender Musculoskeletal: Normal range of motion.     Neurological: Alert and oriented to person, place, and time. Non focal  Skin: Skin is warm and dry.   Data Reviewed:  I have reviewed all images and labs.      Latest Ref Rng &  Units 10/19/2023    5:00 AM 10/19/2023    4:23 AM 10/18/2023   11:33 AM  CBC  WBC 4.0 - 10.5 K/uL 26.4  25.9  29.5   Hemoglobin 13.0 - 17.0 g/dL 9.9  9.8  88.8   Hematocrit 39.0 - 52.0 % 30.8  29.9  33.6   Platelets 150 - 400 K/uL 382  366  391       Latest Ref Rng & Units 10/19/2023    4:23 AM 10/18/2023   11:33 AM 10/16/2023   12:12 PM  BMP  Glucose 70 - 99 mg/dL 91  871  858   BUN 8 - 23 mg/dL 31  43  52   Creatinine  0.61 - 1.24 mg/dL 8.78  8.44  8.26   Sodium 135 - 145 mmol/L 140  139  136   Potassium 3.5 - 5.1 mmol/L 3.9  4.1  4.1   Chloride 98 - 111 mmol/L 108  108  103   CO2 22 - 32 mmol/L 21  20  19    Calcium  8.9 - 10.3 mg/dL 8.2  8.4  8.5      Family Communication: Patient caregiver   Disposition: Status is: Inpatient Remains inpatient appropriate because: treatment of his pneumonia   Planned Discharge Destination:  Pending clinical course    Time spent: 35 minutes  Author: Alban Pepper, MD 10/19/2023 4:09 PM  For on call review [email or web address].

## 2023-10-19 NOTE — Evaluation (Signed)
 Physical Therapy Evaluation Patient Details Name: George [name] MRN: [id number] DOB: April 09, 1932 Today's Date: 10/19/2023  History of Present Illness  89 year old male who presents to the emergency department for chief concerns of cough, shortness of breath. Admitting dx: Community acquired pneumonia. PMHx: dementia, CKD3b, GERD, hypertension, hyperlipidemia, CAD.  Clinical Impression  Patient received in recliner with OT present in room. Patient is VERY HOH requiring information to be written down. He Is able to stand with cga. Ambulated 50 feet with SPC and cga to min A once fatigued and sob. He will likely be able to return home once medically stable. He has caregiver at baseline. Patient may need O2 at home as his O2 sats dropped to 86% with ambulation on room air.         If plan is discharge home, recommend the following: A little help with walking and/or transfers;A little help with bathing/dressing/bathroom;Assist for transportation;Help with stairs or ramp for entrance;Assistance with cooking/housework;Direct supervision/assist for medications management;Direct supervision/assist for financial management   Can travel by private vehicle    yes    Equipment Recommendations None recommended by PT  Recommendations for Other Services       Functional Status Assessment Patient has had a recent decline in their functional status and demonstrates the ability to make significant improvements in function in a reasonable and predictable amount of time.     Precautions / Restrictions Precautions Precautions: Fall Restrictions Weight Bearing Restrictions Per Provider Order: No      Mobility  Bed Mobility   Bed Mobility: Sit to Supine       Sit to supine: Supervision   General bed mobility comments: extra time and effort for bed mobility;use of bed rail, cues    Transfers Overall transfer level: Needs assistance Equipment used: Straight cane Transfers: Sit to/from Stand Sit  to Stand: Contact guard assist           General transfer comment: Min/CGA for transfers during eval    Ambulation/Gait Ambulation/Gait assistance: Contact guard assist Gait Distance (Feet): 50 Feet Assistive device: Straight cane Gait Pattern/deviations: Step-through pattern, Decreased step length - right, Decreased step length - left, Decreased stride length, Trunk flexed Gait velocity: Decr     General Gait Details: patient ambulating mostly with CGA. As he became more fatigued and SOB assist increased to minA. O2 sats on room air down to 86% and patient visibly SOB. [place] returned to patient.  Stairs            Wheelchair Mobility     Tilt Bed    Modified Rankin (Stroke Patients Only)       Balance Overall balance assessment: Needs assistance Sitting-balance support: Feet supported Sitting balance-Leahy Scale: Good     Standing balance support: During functional activity, Reliant on assistive device for balance, Single extremity supported Standing balance-Leahy Scale: Fair Standing balance comment: SPC use and CGA to Min A with fatigue for balance during mobility                             Pertinent Vitals/Pain Pain Assessment Pain Assessment: No/denies pain    Home Living Family/patient expects to be discharged to:: Private residence Living Arrangements: Other relatives Available Help at Discharge: Personal care attendant;Available 24 hours/day Type of Home: House Home Access: Stairs to enter Entrance Stairs-Rails: Right;Left;Can reach both Entrance Stairs-Number of Steps: 4   Home Layout: One level Home Equipment: Cane - single Librarian, Academic (2  wheels);BSC/3in1;Shower seat - built in;Shower seat Additional Comments: mostly urinal    Prior Function Prior Level of Function : Patient poor historian/Family not available;Other (comment)             Mobility Comments: pt ambulates with SPC inside the home and to get to/from the  car with SUP; no falls in the last 6 months per caregiver ADLs Comments: Caregiver assists with bathing and dressing, and total A for IADLs. Pt able to ambulate to the bathroom/use urinal with MOD I/SUP     Extremity/Trunk Assessment   Upper Extremity Assessment Upper Extremity Assessment: Defer to OT evaluation    Lower Extremity Assessment Lower Extremity Assessment: Generalized weakness    Cervical / Trunk Assessment Cervical / Trunk Assessment: Kyphotic  Communication   Communication Communication: Hearing impairment Cueing Techniques: Verbal cues;Gestural cues  Cognition Arousal: Alert Behavior During Therapy: WFL for tasks assessed/performed Overall Cognitive Status: History of cognitive impairments - at baseline                                 General Comments: oriented to person; very HOH and per pt and caregiver his memory is gone        General Comments General comments (skin integrity, edema, etc.): on 2L via [place] on entry with 97% sp02; RA for mobility dropping to 86% and becoming SOB; required 2L to return to 93%    Exercises     Assessment/Plan    PT Assessment Patient needs continued PT services  PT Problem List Decreased strength;Decreased activity tolerance;Decreased mobility       PT Treatment Interventions Gait training;Stair training;Functional mobility training;Therapeutic activities;Therapeutic exercise;Balance training;Neuromuscular re-education;Patient/family education    PT Goals (Current goals can be found in the Care Plan section)  Acute Rehab PT Goals Patient Stated Goal: return home with caregiver PT Goal Formulation: Patient unable to participate in goal setting Time For Goal Achievement: 11/02/23 Potential to Achieve Goals: Good    Frequency Min 1X/week     Co-evaluation               AM-PAC PT 6 Clicks Mobility  Outcome Measure Help needed turning from your back to your side while in a flat bed without  using bedrails?: A Little Help needed moving from lying on your back to sitting on the side of a flat bed without using bedrails?: A Little Help needed moving to and from a bed to a chair (including a wheelchair)?: A Little Help needed standing up from a chair using your arms (e.g., wheelchair or bedside chair)?: A Little Help needed to walk in hospital room?: A Little Help needed climbing 3-5 steps with a railing? : A Lot 6 Click Score: 17    End of Session Equipment Utilized During Treatment: Oxygen Activity Tolerance: Patient limited by fatigue;Other (comment) (SOB) Patient left: in bed;with call bell/phone within reach;with bed alarm set Nurse Communication: Mobility status PT Visit Diagnosis: Other abnormalities of gait and mobility (R26.89);Muscle weakness (generalized) (M62.81);Unsteadiness on feet (R26.81);Difficulty in walking, not elsewhere classified (R26.2)    Time: 0920-0928 PT Time Calculation (min) (ACUTE ONLY): 8 min   Charges:   PT Evaluation $PT Eval Moderate Complexity: 1 Mod   PT General Charges $$ ACUTE PT VISIT: 1 Visit         Jaline Pincock, PT, GCS 10/19/23,10:08 AM

## 2023-10-19 NOTE — Evaluation (Signed)
 Occupational Therapy Evaluation Patient Details Name: George [name] MRN: [id number] DOB: [birth year] Today's Date: 10/19/2023   History of Present Illness 89 year old male who presents to the emergency department for chief concerns of cough, shortness of breath. Admitting dx: Community acquired pneumonia. PMHx: dementia, CKD3b, GERD, hypertension, hyperlipidemia, CAD.   Clinical Impression   Pt was seen for PT/OT co-evaluation this date. Prior to hospital admission, pt was residing in a one level home with his caregiver Creed who assists him with ADLs and IADLs and drives him to his appts. He ambulated only short household distances using his SPC with intermittent SUP. No falls in the last 6 months.  Pt presents to acute OT demonstrating impaired ADL performance and functional mobility 2/2 weakness and low activity tolerance (See OT problem list for additional functional deficits). Pt currently requires Min A for bed mobility via HHA. CGA for STS and SPT to recliner, as well as CGA progressing to Min A with fatigue for ~50 feet of mobility using SPC. Sp02 WFL on 2L, but placed on RA during mobility trial with noted drop to 86% and pt noticeably SOB. Placed back on 2L and improved to 93% with short rest break/PLB.  Pt would benefit from skilled OT services to address noted impairments and functional limitations (see below for any additional details) in order to maximize safety and independence while minimizing falls risk and caregiver burden. Do not anticipate the need for follow up OT services upon acute hospital DC as he is physically near baseline, but SOB will need to improve.        If plan is discharge home, recommend the following: A little help with walking and/or transfers;A lot of help with bathing/dressing/bathroom;Direct supervision/assist for medications management;Supervision due to cognitive status;Direct supervision/assist for financial management;Assist for transportation;Assistance  with cooking/housework;Help with stairs or ramp for entrance    Functional Status Assessment  Patient has had a recent decline in their functional status and demonstrates the ability to make significant improvements in function in a reasonable and predictable amount of time.  Equipment Recommendations  None recommended by OT    Recommendations for Other Services       Precautions / Restrictions Precautions Precautions: Fall Restrictions Weight Bearing Restrictions Per Provider Order: No      Mobility Bed Mobility Overal bed mobility: Needs Assistance Bed Mobility: Supine to Sit, Sit to Supine     Supine to sit: Min assist, HOB elevated Sit to supine: Supervision   General bed mobility comments: extra time and effort for bed mobility; used HHA from therapist to get to EOB    Transfers Overall transfer level: Needs assistance Equipment used: Straight cane Transfers: Sit to/from Stand, Bed to chair/wheelchair/BSC Sit to Stand: Contact guard assist     Step pivot transfers: Contact guard assist     General transfer comment: Min/CGA for transfers during eval      Balance Overall balance assessment: Needs assistance Sitting-balance support: Feet supported Sitting balance-Leahy Scale: Good     Standing balance support: During functional activity, Reliant on assistive device for balance Standing balance-Leahy Scale: Poor Standing balance comment: SPC use and CGA to Min A with fatigue for balance during mobility                           ADL either performed or assessed with clinical judgement   ADL Overall ADL's : Needs assistance/impaired  Toilet Transfer: Electronics Engineer Details (indicate cue type and reason): simulated to recliner from bed; CGA using SPC via step pivot                 Vision         Perception         Praxis         Pertinent Vitals/Pain Pain  Assessment Pain Assessment: No/denies pain     Extremity/Trunk Assessment Upper Extremity Assessment Upper Extremity Assessment: Defer to OT evaluation   Lower Extremity Assessment Lower Extremity Assessment: Generalized weakness   Cervical / Trunk Assessment Cervical / Trunk Assessment: Kyphotic   Communication Communication Communication: Hearing impairment Cueing Techniques: Verbal cues;Gestural cues   Cognition Arousal: Alert Behavior During Therapy: WFL for tasks assessed/performed Overall Cognitive Status: History of cognitive impairments - at baseline                                 General Comments: oriented to person; very HOH and per pt and caregiver his memory is gone     General Comments  on 2L via [place] on entry with 97% sp02; RA for mobility dropping to 86% and becoming SOB; required 2L to return to 93%    Exercises Other Exercises Other Exercises: Edu on role of OT in acute setting and importance of therapy to maximize strength/safety/IND.   Shoulder Instructions      Home Living Family/patient expects to be discharged to:: Private residence Living Arrangements: Other relatives Available Help at Discharge: Personal care attendant;Available 24 hours/day Type of Home: House Home Access: Stairs to enter [employer] of Steps: 4 Entrance Stairs-Rails: Right;Left;Can reach both Home Layout: One level     Bathroom Shower/Tub: Chief Strategy Officer: Handicapped height     Home Equipment: Cane - single Librarian, Academic (2 wheels);BSC/3in1;Shower seat - built in;Shower seat   Additional Comments: mostly urinal      Prior Functioning/Environment Prior Level of Function : Patient poor historian/Family not available;Other (comment)             Mobility Comments: pt ambulates with SPC inside the home and to get to/from the car with SUP; no falls in the last 6 months per caregiver ADLs Comments: Caregiver assists  with bathing and dressing, and total A for IADLs. Pt able to ambulate to the bathroom/use urinal with MOD I/SUP        OT Problem List: Decreased strength;Decreased activity tolerance;Impaired balance (sitting and/or standing);Decreased cognition      OT Treatment/Interventions: Self-care/ADL training;Therapeutic exercise;Therapeutic activities;Patient/family education;Balance training;Energy conservation    OT Goals(Current goals can be found in the care plan section) Acute Rehab OT Goals Patient Stated Goal: improve breathing OT Goal Formulation: With patient Time For Goal Achievement: 11/02/23 Potential to Achieve Goals: Good ADL Goals Pt Will Perform Lower Body Bathing: with mod assist;sitting/lateral leans;sit to/from stand Pt Will Perform Lower Body Dressing: with mod assist;sitting/lateral leans;sit to/from stand Pt Will Transfer to Toilet: with supervision;ambulating Additional ADL Goal #1: Pt will demo/verbalize use of PLB to prevent overexertion during ADL and functional transfers.  OT Frequency: Min 1X/week    Co-evaluation              AM-PAC OT 6 Clicks Daily Activity     Outcome Measure Help from another person eating meals?: None Help from another person taking care of personal grooming?: A Little Help from another  person toileting, which includes using toliet, bedpan, or urinal?: A Little Help from another person bathing (including washing, rinsing, drying)?: A Lot Help from another person to put on and taking off regular upper body clothing?: A Little Help from another person to put on and taking off regular lower body clothing?: A Lot 6 Click Score: 17   End of Session Equipment Utilized During Treatment: Oxygen [facility]) Nurse Communication: Mobility status  Activity Tolerance: Patient tolerated treatment well;Patient limited by fatigue Patient left: in bed;with call bell/phone within reach;with bed alarm set  OT Visit Diagnosis: Other abnormalities of  gait and mobility (R26.89);Muscle weakness (generalized) (M62.81);Unsteadiness on feet (R26.81)                Time: 9141-9072 OT Time Calculation (min): 29 min Charges:  OT General Charges $OT Visit: 1 Visit OT Evaluation $OT Eval Moderate Complexity: 1 Mod Emmalee Solivan, OTR/L  10/19/23, 10:19 AM  Andrewjames Weirauch E Kortnie Stovall 10/19/2023, 10:16 AM

## 2023-10-19 NOTE — Plan of Care (Signed)
  Problem: Clinical Measurements: Goal: Respiratory complications will improve Outcome: Progressing   Problem: Clinical Measurements: Goal: Cardiovascular complication will be avoided Outcome: Progressing   Problem: Coping: Goal: Level of anxiety will decrease Outcome: Progressing   Problem: Pain Management: Goal: General experience of comfort will improve Outcome: Progressing   Problem: Safety: Goal: Ability to remain free from injury will improve Outcome: Progressing

## 2023-10-20 DIAGNOSIS — J189 Pneumonia, unspecified organism: Secondary | ICD-10-CM | POA: Diagnosis not present

## 2023-10-20 LAB — BASIC METABOLIC PANEL
Anion gap: 10 (ref 5–15)
BUN: 23 mg/dL (ref 8–23)
CO2: 21 mmol/L — ABNORMAL LOW (ref 22–32)
Calcium: 8.1 mg/dL — ABNORMAL LOW (ref 8.9–10.3)
Chloride: 105 mmol/L (ref 98–111)
Creatinine, Ser: 1.05 mg/dL (ref 0.61–1.24)
GFR, Estimated: >60 mL/min (ref >60)
Glucose, Bld: 94 mg/dL (ref 70–99)
Potassium: 3.9 mmol/L (ref 3.5–5.1)
Sodium: 136 mmol/L (ref 135–145)

## 2023-10-20 LAB — CBC WITH DIFFERENTIAL/PLATELET
Abs Immature Granulocytes: 0.17 10*3/uL — ABNORMAL HIGH (ref 0.00–0.07)
Basophils Absolute: 0.1 10*3/uL (ref 0.0–0.1)
Basophils Relative: 0 %
Eosinophils Absolute: 0.2 10*3/uL (ref 0.0–0.5)
Eosinophils Relative: 1 %
HCT: 29.8 % — ABNORMAL LOW (ref 39.0–52.0)
Hemoglobin: 10.3 g/dL — ABNORMAL LOW (ref 13.0–17.0)
Immature Granulocytes: 1 %
Lymphocytes Relative: 6 %
Lymphs Abs: 1.2 10*3/uL (ref 0.7–4.0)
MCH: 30.5 pg (ref 26.0–34.0)
MCHC: 34.6 g/dL (ref 30.0–36.0)
MCV: 88.2 fL (ref 80.0–100.0)
Monocytes Absolute: 1.3 10*3/uL — ABNORMAL HIGH (ref 0.1–1.0)
Monocytes Relative: 6 %
Neutro Abs: 19.7 10*3/uL — ABNORMAL HIGH (ref 1.7–7.7)
Neutrophils Relative %: 86 %
Platelets: 363 10*3/uL (ref 150–400)
RBC: 3.38 MIL/uL — ABNORMAL LOW (ref 4.22–5.81)
RDW: 13.7 % (ref 11.5–15.5)
WBC: 22.7 10*3/uL — ABNORMAL HIGH (ref 4.0–10.5)
nRBC: 0 % (ref 0.0–0.2)

## 2023-10-20 LAB — GLUCOSE, CAPILLARY
Glucose-Capillary: 89 mg/dL (ref 70–99)
Glucose-Capillary: 121 mg/dL — ABNORMAL HIGH (ref 70–99)
Glucose-Capillary: 105 mg/dL — ABNORMAL HIGH (ref 70–99)
Glucose-Capillary: 87 mg/dL (ref 70–99)

## 2023-10-20 LAB — HEMOGLOBIN A1C
Hgb A1c MFr Bld: 5.8 % — ABNORMAL HIGH (ref 4.8–5.6)
Mean Plasma Glucose: 120 mg/dL

## 2023-10-20 MED ORDER — AZITHROMYCIN 250 MG PO TABS: 500.0000 mg | ORAL_TABLET | Freq: Every day | ORAL | Status: DC

## 2023-10-20 NOTE — Plan of Care (Signed)

## 2023-10-20 NOTE — Progress Notes (Signed)
SATURATION QUALIFICATIONS: (This note is used to comply with regulatory documentation for home oxygen)  Patient Saturations on Room Air at Rest = 94  Patient Saturations on Room Air while Ambulating = 86  Patient Saturations on 2 Liters of oxygen while Ambulating = 91  Please briefly explain why patient needs home oxygen:

## 2023-10-20 NOTE — Progress Notes (Signed)
   10/19/23 1939  Assess: MEWS Score  Temp 98.2 F (36.8 C)  BP (!) 159/82  MAP (mmHg) 104  Pulse Rate (!) 111  Resp 20  SpO2 100 %  O2 Device Nasal Cannula  O2 Flow Rate (L/min) 3 L/min  Assess: MEWS Score  MEWS Temp 0  MEWS Systolic 0  MEWS Pulse 2  MEWS RR 0  MEWS LOC 0  MEWS Score 2  MEWS Score Color Yellow  Assess: if the MEWS score is Yellow or Red  Were vital signs accurate and taken at a resting state? Yes  Does the patient meet 2 or more of the SIRS criteria? No  MEWS guidelines implemented  Yes, yellow  Treat  MEWS Interventions Considered administering scheduled or prn medications/treatments as ordered  Take Vital Signs  Increase Vital Sign Frequency  Yellow: Q2hr x1, continue Q4hrs until patient remains green for 12hrs  Escalate  MEWS: Escalate Yellow: Discuss with charge nurse and consider notifying provider and/or RRT  Notify: Charge Nurse/RN  Name of Charge Nurse/RN Notified Youssouf Shipley K  Assess: SIRS CRITERIA  SIRS Temperature  0  SIRS Respirations  0  SIRS Pulse 1  SIRS WBC 0  SIRS Score Sum  1

## 2023-10-20 NOTE — Progress Notes (Signed)
 Pt kept asking for his "neck chain with his ID card". Per son at bedside, it is at home. Pt aware.

## 2023-10-20 NOTE — Progress Notes (Signed)
 PHARMACIST - PHYSICIAN COMMUNICATION DR: Franchot CONCERNING: Antibiotic IV to Oral Route Change Policy  RECOMMENDATION: This patient is receiving azithromycin  by the intravenous route. Based on criteria approved by the Pharmacy and Therapeutics Committee, the antibiotic(s) is/are being converted to the equivalent oral dose form(s).  DESCRIPTION: These criteria include: Patient being treated for a respiratory tract infection, urinary tract infection, cellulitis, or clostridium difficile associated diarrhea if on metronidazole The patient is not neutropenic and does not exhibit a GI malabsorption state The patient is eating (either orally or via tube) and/or has been taking other orally administered medications for a least 24 hours The patient is improving clinically and has a Tmax < 100.5  If you have questions about this conversion, please contact the Pharmacy Department.  Evonnie Nieves, PharmD Pharmacy Resident  10/20/2023 12:44 PM

## 2023-10-20 NOTE — Progress Notes (Signed)
 Progress Note   Patient: George [name] FMW:[id number] DOB: [birth year] DOA: 10/18/2023     2 DOS: the patient was seen and examined on 10/20/2023   Brief hospital course: Mr. George [name] is a 89 year old male with history of dementia, CKD stage IIIb baseline, GERD, hypertension, hyperlipidemia, CAD, who presents to the emergency department for chief concerns of cough, shortness of breath.  Vitals in the ED showed temperature 98.1, respiration rate 18, heart rate 75, blood pressure 105/49, improved to 121/62, SpO2 95% on room air.  Serum sodium is 139, potassium 4.1, chloride 108, bicarb 20, BUN of 43, serum creatinine 1.55, EGFR 43, nonfasting blood glucose 128, WBC 29.5, hemoglobin 11.1, platelets of 391.  UA has been ordered and is pending collection.  Blood cultures x 2 are in process.  ED treatment: DuoNebs 2 treatments were given, azithromycin  500 mg IV one-time dose, ceftriaxone  2 g IV one-time dose.  Assessment and Plan: Community acquired pneumonia COPD exacerbation, mild  Leukocytosis  CXR suspicious for pneumonia  Leukocytosis with left shift, improving  pro calcitonin elevated on admission labs  S/p Azithromycin  500 mg IV x3 doses, ceftriaxone  2 g IV daily x3 doses -Continue ceftriaxone  for 2 additional days  -Hold steroids  -Spirometry, flutter valve -continue mucinex   -Duonebs    Type II diabetes mellitus with renal manifestations (HCC) Well controlled. Insulin  SSI with at bedtime coverage, renal dosing ordered Goal inpatient blood glucose level of 140-180  Normocytic anemia  Stable, will check iron panel   Hearing loss Bilateral Moderate to severe  Weakness Caregiver notice weakness around the onset of worsening cough about 5d prior to admission PT, OT recommending continued assistance at home   Dementia [facility]) Baseline unknown Fall and aspiration precautions  Anxiety Complicated by baseline dementia If becomes agitated will start night time seroquel    Hypertension Continue  home verapamil    Hyperlipidemia Simvastatin  10 mg nightly resumed  CKD3a/3b Stable.     Latest Ref Rng & Units 10/20/2023    8:23 AM 10/19/2023    4:23 AM 10/18/2023   11:33 AM  BMP  Glucose 70 - 99 mg/dL 94  91  871   BUN 8 - 23 mg/dL 23  31  43   Creatinine 0.61 - 1.24 mg/dL 8.94  8.78  8.44   Sodium 135 - 145 mmol/L 136  140  139   Potassium 3.5 - 5.1 mmol/L 3.9  3.9  4.1   Chloride 98 - 111 mmol/L 105  108  108   CO2 22 - 32 mmol/L 21  21  20    Calcium  8.9 - 10.3 mg/dL 8.1  8.2  8.4        Subjective: Difficult to communicate. States he does not recall how he felt on admission. He feels his breathing is okay this AM.   Physical Exam: Vitals:   10/20/23 0809 10/20/23 1000 10/20/23 1206 10/20/23 1600  BP:  95/66  123/63  Pulse: 72 71  68  Resp:  19  18  Temp: 97.6 F (36.4 C)  97.8 F (36.6 C)   TempSrc: Oral  Oral   SpO2: 97% 98%  98%  Weight:      Height:       Physical Exam  Constitutional: In no distress.  Cardiovascular: Normal rate, regular rhythm. No lower extremity edema  Pulmonary: Non labored breathing on room air, no wheezing or rales. Abdominal: Soft. Normal bowel sounds. Non distended and non tender Musculoskeletal: Normal range of motion.  Neurological: Non focal  Skin: Skin is warm and dry.   Data Reviewed:  I have reviewed all images and labs.      Latest Ref Rng & Units 10/20/2023    8:24 AM 10/19/2023    5:00 AM 10/19/2023    4:23 AM  CBC  WBC 4.0 - 10.5 K/uL 22.7  26.4  25.9   Hemoglobin 13.0 - 17.0 g/dL 89.6  9.9  9.8   Hematocrit 39.0 - 52.0 % 29.8  30.8  29.9   Platelets 150 - 400 K/uL 363  382  366       Latest Ref Rng & Units 10/20/2023    8:23 AM 10/19/2023    4:23 AM 10/18/2023   11:33 AM  BMP  Glucose 70 - 99 mg/dL 94  91  871   BUN 8 - 23 mg/dL 23  31  43   Creatinine 0.61 - 1.24 mg/dL 8.94  8.78  8.44   Sodium 135 - 145 mmol/L 136  140  139   Potassium 3.5 - 5.1 mmol/L 3.9  3.9  4.1   Chloride 98 -  111 mmol/L 105  108  108   CO2 22 - 32 mmol/L 21  21  20    Calcium  8.9 - 10.3 mg/dL 8.1  8.2  8.4      Family Communication: Patient caregiver   Disposition: Status is: Inpatient Remains inpatient appropriate because: treatment of his pneumonia   Planned Discharge Destination:  Pending clinical course    Time spent: 30 minutes  Author: Alban Pepper, MD 10/20/2023 6:15 PM  For on call review [email or web address].

## 2023-10-20 NOTE — TOC Initial Note (Signed)
 Transition of Care [facility]) - Initial/Assessment Note    Patient Details  Name: George [name] MRN: [id number] Date of Birth: [birth year]  Transition of Care [facility]) CM/SW Contact:    Shanea Karney C Kortnie Stovall, RN Phone Number: 10/20/2023, 3:10 PM  Clinical Narrative:                 Spoke with patient's daughter, Karna to advise patient may need home oxygen.         Patient Goals and CMS Choice            Expected Discharge Plan and Services                                              Prior Living Arrangements/Services                       Activities of Daily Living   ADL Screening (condition at time of admission) Independently performs ADLs?: No Does the patient have a NEW difficulty with bathing/dressing/toileting/self-feeding that is expected to last >3 days?: No Does the patient have a NEW difficulty with getting in/out of bed, walking, or climbing stairs that is expected to last >3 days?: No Does the patient have a NEW difficulty with communication that is expected to last >3 days?: No Is the patient deaf or have difficulty hearing?: Yes Does the patient have difficulty seeing, even when wearing glasses/contacts?: No Does the patient have difficulty concentrating, remembering, or making decisions?: Yes  Permission Sought/Granted                  Emotional Assessment              Admission diagnosis:  Community acquired pneumonia [J18.9] Pneumonia due to infectious organism, unspecified laterality, unspecified part of lung [J18.9] Patient Active Problem List   Diagnosis Date Noted   Community acquired pneumonia 10/18/2023   Anxiety 10/18/2023   Dementia (HCC) 10/18/2023   Weakness 10/18/2023   Hearing loss 10/18/2023   DNR (do not resuscitate) 02/14/2023   Chronic respiratory failure with hypoxia (HCC) 01/25/2023   History of fracture of right fifth finger on 01/18/23 01/25/2023   OSA (obstructive sleep apnea) 01/25/2023   Acute on  chronic respiratory failure with hypoxia (HCC) 06/08/2021   Gout 06/08/2021   CAP (community acquired pneumonia) 06/08/2021   Status post reverse arthroplasty of shoulder, left 02/10/2021   CKD stage 3b, GFR 30-44 ml/min (HCC) 11/04/2020   BPH with obstruction/lower urinary tract symptoms 11/04/2020   Foot pain 07/11/2018   Urinary urgency 06/27/2017   Erectile dysfunction 06/27/2017   Hypogonadism in male 06/27/2017   Tobacco abuse 02/09/2016   Essential hypertension 02/09/2016   GERD (gastroesophageal reflux disease) 02/09/2016   Hyperlipidemia 04/22/2014   Hypertension 04/22/2014   COPD (chronic obstructive pulmonary disease) (HCC) 04/22/2014   Type II diabetes mellitus with renal manifestations (HCC) 04/22/2014   CAD (coronary artery disease) 09/10/1998   PCP:  Glover Lenis, MD Pharmacy:   [facility] - [place], [place] - [address] [place] [address] Phone: [phone] Fax: [phone]  [facility] [address] [place] - [address] [place]) [place] [address] Phone: [phone] Fax: [phone]     Social Drivers of Health (SDOH) Social History: SDOH Screenings   Food Insecurity: Patient  Unable To Answer (02/15/2023)  Housing: Low Risk  (01/26/2023)  Transportation Needs: No Transportation Needs (01/26/2023)  Utilities: Not At Risk (01/26/2023)  Tobacco Use: High Risk (10/18/2023)   SDOH Interventions:     Readmission Risk Interventions    01/28/2023    1:51 PM  Readmission Risk Prevention Plan  Transportation Screening Complete  HRI or Home Care Consult Complete  Palliative Care Screening Complete  Medication Review (RN Care Manager) Complete

## 2023-10-21 DIAGNOSIS — J449 Chronic obstructive pulmonary disease, unspecified: Secondary | ICD-10-CM | POA: Diagnosis not present

## 2023-10-21 DIAGNOSIS — R531 Weakness: Secondary | ICD-10-CM

## 2023-10-21 DIAGNOSIS — I10 Essential (primary) hypertension: Secondary | ICD-10-CM

## 2023-10-21 DIAGNOSIS — N1832 Chronic kidney disease, stage 3b: Secondary | ICD-10-CM

## 2023-10-21 DIAGNOSIS — E1122 Type 2 diabetes mellitus with diabetic chronic kidney disease: Secondary | ICD-10-CM

## 2023-10-21 DIAGNOSIS — J189 Pneumonia, unspecified organism: Secondary | ICD-10-CM | POA: Diagnosis not present

## 2023-10-21 DIAGNOSIS — F419 Anxiety disorder, unspecified: Secondary | ICD-10-CM

## 2023-10-21 DIAGNOSIS — E785 Hyperlipidemia, unspecified: Secondary | ICD-10-CM

## 2023-10-21 LAB — CBC WITH DIFFERENTIAL/PLATELET
Abs Immature Granulocytes: 0.28 10*3/uL — ABNORMAL HIGH (ref 0.00–0.07)
Basophils Absolute: 0.1 10*3/uL (ref 0.0–0.1)
Basophils Relative: 0 %
Eosinophils Absolute: 0.1 10*3/uL (ref 0.0–0.5)
Eosinophils Relative: 1 %
HCT: 30.7 % — ABNORMAL LOW (ref 39.0–52.0)
Hemoglobin: 10 g/dL — ABNORMAL LOW (ref 13.0–17.0)
Immature Granulocytes: 1 %
Lymphocytes Relative: 4 %
Lymphs Abs: 1 10*3/uL (ref 0.7–4.0)
MCH: 29.8 pg (ref 26.0–34.0)
MCHC: 32.6 g/dL (ref 30.0–36.0)
MCV: 91.4 fL (ref 80.0–100.0)
Monocytes Absolute: 1.5 10*3/uL — ABNORMAL HIGH (ref 0.1–1.0)
Monocytes Relative: 6 %
Neutro Abs: 21.4 10*3/uL — ABNORMAL HIGH (ref 1.7–7.7)
Neutrophils Relative %: 88 %
Platelets: 396 10*3/uL (ref 150–400)
RBC: 3.36 MIL/uL — ABNORMAL LOW (ref 4.22–5.81)
RDW: 13.7 % (ref 11.5–15.5)
WBC: 24.3 10*3/uL — ABNORMAL HIGH (ref 4.0–10.5)
nRBC: 0 % (ref 0.0–0.2)

## 2023-10-21 LAB — IRON AND TIBC
Iron: 25 ug/dL — ABNORMAL LOW (ref 45–182)
Saturation Ratios: 21 % (ref 17.9–39.5)
TIBC: 119 ug/dL — ABNORMAL LOW (ref 250–450)
UIBC: 94 ug/dL

## 2023-10-21 LAB — GLUCOSE, CAPILLARY
Glucose-Capillary: 83 mg/dL (ref 70–99)
Glucose-Capillary: 91 mg/dL (ref 70–99)
Glucose-Capillary: 85 mg/dL (ref 70–99)
Glucose-Capillary: 76 mg/dL (ref 70–99)

## 2023-10-21 LAB — BASIC METABOLIC PANEL
Anion gap: 8 (ref 5–15)
BUN: 20 mg/dL (ref 8–23)
CO2: 25 mmol/L (ref 22–32)
Calcium: 8.2 mg/dL — ABNORMAL LOW (ref 8.9–10.3)
Chloride: 109 mmol/L (ref 98–111)
Creatinine, Ser: 1.17 mg/dL (ref 0.61–1.24)
GFR, Estimated: 60 mL/min — ABNORMAL LOW (ref >60)
Glucose, Bld: 92 mg/dL (ref 70–99)
Potassium: 4.7 mmol/L (ref 3.5–5.1)
Sodium: 142 mmol/L (ref 135–145)

## 2023-10-21 LAB — FERRITIN: Ferritin: 414 ng/mL — ABNORMAL HIGH (ref 24–336)

## 2023-10-21 NOTE — Progress Notes (Signed)
 PT Cancellation Note  Patient Details Name: George [name] MRN: [id number] DOB: 02/22/1932   Cancelled Treatment:    Reason Eval/Treat Not Completed: Patient declined, no reason specified Patient refused ambulation this am. Will re-attempt as able at later time/date.  [name] 10/21/2023, 9:28 AM

## 2023-10-21 NOTE — Progress Notes (Signed)
 Progress Note   Patient: George [name] FMW:[id number] DOB: 1932-05-21 DOA: 10/18/2023     3 DOS: the patient was seen and examined on 10/21/2023   Brief hospital course: Mr. Marin Milley is a 89 year old male with history of dementia, CKD stage IIIb baseline, GERD, hypertension, hyperlipidemia, CAD, who presents to the emergency department for chief concerns of cough, shortness of breath.  Vitals in the ED showed temperature 98.1, respiration rate 18, heart rate 75, blood pressure 105/49, improved to 121/62, SpO2 95% on room air.  Serum sodium is 139, potassium 4.1, chloride 108, bicarb 20, BUN of 43, serum creatinine 1.55, EGFR 43, nonfasting blood glucose 128, WBC 29.5, hemoglobin 11.1, platelets of 391.  UA has been ordered and is pending collection.  Blood cultures x 2 are in process.  Patient was started on ceftriaxone  and Zithromax  for concern of pneumonia.  Procalcitonin was elevated.  1/10: Vitals and labs stable, iron studies with low iron and TIBC, elevated ferritin, more consistent with anemia of chronic disease.  A1c of 5.8.  Slight worsening of leukocytosis at 24.3, remained on 3 L of oxygen. PT with no follow-up recommendation, patient has 24/7 care available but apparently his caregiver is sick today.  Assessment and Plan: Community acquired pneumonia COPD exacerbation, mild  Leukocytosis  CXR suspicious for pneumonia  Leukocytosis with left shift, slight worsening today pro calcitonin elevated on admission labs  S/p Azithromycin  500 mg IV x3 doses, ceftriaxone  2 g IV daily x3 doses -Continue ceftriaxone  for 2 additional days  -Hold steroids  -Spirometry, flutter valve -continue mucinex   -Duonebs    Type II diabetes mellitus with renal manifestations (HCC) Well controlled. Insulin  SSI with at bedtime coverage, renal dosing ordered Goal inpatient blood glucose level of 140-180  Normocytic anemia  Stable, will check iron panel   Hearing  loss Bilateral Moderate to severe  Weakness Caregiver notice weakness around the onset of worsening cough about 5d prior to admission PT, OT recommending continued assistance at home   Dementia [facility]) Baseline unknown Fall and aspiration precautions  Anxiety Complicated by baseline dementia If becomes agitated will start night time seroquel   Hypertension Continue  home verapamil    Hyperlipidemia Simvastatin  10 mg nightly resumed  CKD3a. Stable.    Subjective: Patient was seen and examined today.  Very hard of hearing.  No new concern.  Caregiver at bedside.  Physical Exam: Vitals:   10/21/23 0344 10/21/23 0800 10/21/23 0831 10/21/23 1303  BP: (!) 100/55 121/65    Pulse: 60 64 74 61  Resp: 17 18 17 18   Temp: 97.8 F (36.6 C)  97.8 F (36.6 C) 97.6 F (36.4 C)  TempSrc:   Oral Oral  SpO2: 97% 98% 97% 96%  Weight:      Height:       General.  Frail and hard of hearing elderly man, in no acute distress. Pulmonary.  Lungs clear bilaterally, normal respiratory effort. CV.  Regular rate and rhythm, no JVD, rub or murmur. Abdomen.  Soft, nontender, nondistended, BS positive. CNS.  Alert and oriented .  No focal neurologic deficit. Extremities.  No edema, no cyanosis, pulses intact and symmetrical.  Data Reviewed:  I have reviewed all images and labs.      Latest Ref Rng & Units 10/21/2023    4:19 AM 10/20/2023    8:24 AM 10/19/2023    5:00 AM  CBC  WBC 4.0 - 10.5 K/uL 24.3  22.7  26.4   Hemoglobin 13.0 - 17.0 g/dL  10.0  10.3  9.9   Hematocrit 39.0 - 52.0 % 30.7  29.8  30.8   Platelets 150 - 400 K/uL 396  363  382       Latest Ref Rng & Units 10/21/2023    4:19 AM 10/20/2023    8:23 AM 10/19/2023    4:23 AM  BMP  Glucose 70 - 99 mg/dL 92  94  91   BUN 8 - 23 mg/dL 20  23  31    Creatinine 0.61 - 1.24 mg/dL 8.82  8.94  8.78   Sodium 135 - 145 mmol/L 142  136  140   Potassium 3.5 - 5.1 mmol/L 4.7  3.9  3.9   Chloride 98 - 111 mmol/L 109  105  108   CO2 22 - 32  mmol/L 25  21  21    Calcium  8.9 - 10.3 mg/dL 8.2  8.1  8.2      Family Communication: Discussed with caregiver at bedside  Disposition: Status is: Inpatient Remains inpatient appropriate because: treatment of his pneumonia   Planned Discharge Destination: Home with home health  DVT prophylaxis.  Lovenox  Time spent: 45 minutes  This record has been created using Conservation officer, historic buildings. Errors have been sought and corrected,but may not always be located. Such creation errors do not reflect on the standard of care.   Author: Amaryllis Dare, MD 10/21/2023 2:18 PM  For on call review [email or web address].

## 2023-10-21 NOTE — TOC Progression Note (Signed)
 Transition of Care [facility]) - Progression Note    Patient Details  Name: George [name] MRN: [id number] Date of Birth: 1931/12/30  Transition of Care [facility]) CM/SW Contact  Tomasa JAYSON Childes, RN Phone Number: 10/21/2023, 3:18 PM  Clinical Narrative:    Home oxygen request sent to [facility]. Likely d/c tomorrow.          Expected Discharge Plan and Services                                               Social Determinants of Health (SDOH) Interventions SDOH Screenings   Food Insecurity: Patient Unable To Answer (02/15/2023)  Housing: Low Risk  (01/26/2023)  Transportation Needs: No Transportation Needs (01/26/2023)  Utilities: Not At Risk (01/26/2023)  Tobacco Use: High Risk (10/18/2023)    Readmission Risk Interventions    01/28/2023    1:51 PM  Readmission Risk Prevention Plan  Transportation Screening Complete  HRI or Home Care Consult Complete  Palliative Care Screening Complete  Medication Review (RN Care Manager) Complete

## 2023-10-21 NOTE — Plan of Care (Signed)
  Problem: Clinical Measurements: Goal: Diagnostic test results will improve Outcome: Progressing Goal: Signs and symptoms of infection will decrease Outcome: Progressing   Problem: Clinical Measurements: Goal: Signs and symptoms of infection will decrease Outcome: Progressing   Problem: Respiratory: Goal: Ability to maintain adequate ventilation will improve Outcome: Progressing   Problem: Coping: Goal: Ability to adjust to condition or change in health will improve Outcome: Progressing

## 2023-10-22 ENCOUNTER — Inpatient Hospital Stay: Payer: Medicare Other

## 2023-10-22 DIAGNOSIS — N1832 Chronic kidney disease, stage 3b: Secondary | ICD-10-CM | POA: Diagnosis not present

## 2023-10-22 DIAGNOSIS — J449 Chronic obstructive pulmonary disease, unspecified: Secondary | ICD-10-CM | POA: Diagnosis not present

## 2023-10-22 DIAGNOSIS — E1122 Type 2 diabetes mellitus with diabetic chronic kidney disease: Secondary | ICD-10-CM | POA: Diagnosis not present

## 2023-10-22 DIAGNOSIS — J189 Pneumonia, unspecified organism: Secondary | ICD-10-CM | POA: Diagnosis not present

## 2023-10-22 LAB — BASIC METABOLIC PANEL
Anion gap: 11 (ref 5–15)
BUN: 22 mg/dL (ref 8–23)
CO2: 23 mmol/L (ref 22–32)
Calcium: 8.2 mg/dL — ABNORMAL LOW (ref 8.9–10.3)
Chloride: 105 mmol/L (ref 98–111)
Creatinine, Ser: 1.25 mg/dL — ABNORMAL HIGH (ref 0.61–1.24)
GFR, Estimated: 55 mL/min — ABNORMAL LOW (ref >60)
Glucose, Bld: 66 mg/dL — ABNORMAL LOW (ref 70–99)
Potassium: 4.3 mmol/L (ref 3.5–5.1)
Sodium: 139 mmol/L (ref 135–145)

## 2023-10-22 LAB — MRSA NEXT GEN BY PCR, NASAL: MRSA by PCR Next Gen: NOT DETECTED

## 2023-10-22 LAB — CBC
HCT: 31.5 % — ABNORMAL LOW (ref 39.0–52.0)
Hemoglobin: 10.3 g/dL — ABNORMAL LOW (ref 13.0–17.0)
MCH: 30.2 pg (ref 26.0–34.0)
MCHC: 32.7 g/dL (ref 30.0–36.0)
MCV: 92.4 fL (ref 80.0–100.0)
Platelets: 406 10*3/uL — ABNORMAL HIGH (ref 150–400)
RBC: 3.41 MIL/uL — ABNORMAL LOW (ref 4.22–5.81)
RDW: 13.8 % (ref 11.5–15.5)
WBC: 27.3 10*3/uL — ABNORMAL HIGH (ref 4.0–10.5)
nRBC: 0 % (ref 0.0–0.2)

## 2023-10-22 LAB — GLUCOSE, CAPILLARY
Glucose-Capillary: 67 mg/dL — ABNORMAL LOW (ref 70–99)
Glucose-Capillary: 104 mg/dL — ABNORMAL HIGH (ref 70–99)
Glucose-Capillary: 114 mg/dL — ABNORMAL HIGH (ref 70–99)
Glucose-Capillary: 119 mg/dL — ABNORMAL HIGH (ref 70–99)
Glucose-Capillary: 98 mg/dL (ref 70–99)

## 2023-10-22 LAB — PROCALCITONIN: Procalcitonin: 1.04 ng/mL

## 2023-10-22 MED ORDER — SODIUM CHLORIDE 0.9 % IV SOLN
2.0000 g | Freq: Two times a day (BID) | INTRAVENOUS | Status: DC
  Filled 2023-10-22: qty 12.5

## 2023-10-22 MED ORDER — ORAL CARE MOUTH RINSE: 15.0000 mL | OROMUCOSAL | Status: DC | PRN

## 2023-10-22 MED ORDER — SODIUM CHLORIDE 0.9 % IV SOLN
2.0000 g | Freq: Two times a day (BID) | INTRAVENOUS | Status: DC
  Administered 2023-10-22 – 2023-10-23 (×4): 2 g via INTRAVENOUS
  Filled 2023-10-22 (×5): qty 12.5

## 2023-10-22 MED ORDER — LACTATED RINGERS IV SOLN: INTRAVENOUS | Status: AC

## 2023-10-22 NOTE — Plan of Care (Signed)

## 2023-10-22 NOTE — TOC Progression Note (Signed)
 Transition of Care [facility]) - Progression Note    Patient Details  Name: RODRIGUS KILKER MRN: [id number] Date of Birth: [birth year]  Transition of Care [facility]) CM/SW Contact  Heron KATHEE Edison, RN Phone Number: 10/22/2023, 12:25 PM  Clinical Narrative:  10/22/23: Discharge likely tomorrow if stable per provider. Mitch aware at [employer] for DME oxygen delivery. Will need new ambulation saturation test is discharging for home oxygen as prior is expired for insurance purposes.   Please contact TOC RN CM today and tomorrow at [phone] if further/additional needs arise.  Bing Edison MSN RN CM  Care Management Department.  [place]  [facility] Direct Dial: [phone] (Weekends Only) [facility] Main Office Phone: [phone] [facility] Fax: [phone]           Expected Discharge Plan and Services                                               Social Determinants of Health (SDOH) Interventions SDOH Screenings   Food Insecurity: Patient Unable To Answer (02/15/2023)  Housing: Low Risk  (01/26/2023)  Transportation Needs: No Transportation Needs (01/26/2023)  Utilities: Not At Risk (01/26/2023)  Tobacco Use: High Risk (10/18/2023)    Readmission Risk Interventions    01/28/2023    1:51 PM  Readmission Risk Prevention Plan  Transportation Screening Complete  HRI or Home Care Consult Complete  Palliative Care Screening Complete  Medication Review (RN Care Manager) Complete

## 2023-10-22 NOTE — Progress Notes (Signed)
 Progress Note   Patient: George [name] DOB: 1932/09/08 DOA: 10/18/2023     4 DOS: the patient was seen and examined on 10/22/2023   Brief hospital course: Mr. George [name] is a 89 year old male with history of dementia, CKD stage IIIb baseline, GERD, hypertension, hyperlipidemia, CAD, who presents to the emergency department for chief concerns of cough, shortness of breath.  Vitals in the ED showed temperature 98.1, respiration rate 18, heart rate 75, blood pressure 105/49, improved to 121/62, SpO2 95% on room air.  Serum sodium is 139, potassium 4.1, chloride 108, bicarb 20, BUN of 43, serum creatinine 1.55, EGFR 43, nonfasting blood glucose 128, WBC 29.5, hemoglobin 11.1, platelets of 391.  UA has been ordered and is pending collection.  Blood cultures x 2 are in process.  Patient was started on ceftriaxone  and Zithromax  for concern of pneumonia.  Procalcitonin was elevated.  1/10: Vitals and labs stable, iron studies with low iron and TIBC, elevated ferritin, more consistent with anemia of chronic disease.  A1c of 5.8.  Slight worsening of leukocytosis at 24.3, remained on 3 L of oxygen. PT with no follow-up recommendation, patient has 24/7 care available but apparently his caregiver is sick today.  1/11: Remained afebrile but worsening procalcitonin at 1.04 and leukocytosis at 27.3, slight increase in creatinine.  MRSA PCR negative, giving some IV  fluid and Switching antibiotics to cefepime .  Repeat chest x-ray with basal pneumonia and small pleural effusions. Will try to get sputum culture.  Assessment and Plan: Community acquired pneumonia COPD exacerbation, mild  Leukocytosis  Repeat chest x-ray with persistent basilar pneumonia, worsening leukocytosis and procalcitonin.  MRSA PCR negative.  Also ordered blood smear. Received ceftriaxone  and Zithromax  before, switching to cefepime . Sputum cultures  Type II diabetes mellitus with renal manifestations  (HCC) Well controlled.  CBG remained within goal and he was not needing any oxygen so it was discontinued  Normocytic anemia  Stable, iron panel with mild iron deficiency and consistent with anemia of chronic disease  Hearing loss Bilateral Moderate to severe  Weakness Caregiver notice weakness around the onset of worsening cough about 5d prior to admission PT, OT recommending continued assistance at home   Dementia [facility]) Baseline unknown Fall and aspiration precautions  Anxiety Complicated by baseline dementia If becomes agitated will start night time seroquel   Hypertension Continue  home verapamil    Hyperlipidemia Simvastatin  10 mg nightly resumed  CKD3a. Stable.    Subjective: Patient was seen and examined today.  Very hard of hearing and appears lethargic.  Denies any pain.  Physical Exam: Vitals:   10/21/23 2257 10/22/23 0307 10/22/23 0756 10/22/23 1114  BP: 123/74 111/86 131/68 122/74  Pulse: 92 81 80 82  Resp: 20 19 17 16   Temp: 98.6 F (37 C) 98.5 F (36.9 C) 98.6 F (37 C) 98.3 F (36.8 C)  TempSrc:   Oral Oral  SpO2: 92% 90% 97% 94%  Weight:      Height:       General.  Very hard of hearing elderly man, in no acute distress. Pulmonary.  Lungs clear bilaterally, normal respiratory effort. CV.  Regular rate and rhythm, no JVD, rub or murmur. Abdomen.  Soft, nontender, nondistended, BS positive. CNS.  Appears lethargic, no apparent deficit Extremities.  No edema, no cyanosis, pulses intact and symmetrical.  Data Reviewed:  I have reviewed all images and labs.      Latest Ref Rng & Units 10/22/2023    5:04 AM 10/21/2023  4:19 AM 10/20/2023    8:24 AM  CBC  WBC 4.0 - 10.5 K/uL 27.3  24.3  22.7   Hemoglobin 13.0 - 17.0 g/dL 89.6  89.9  89.6   Hematocrit 39.0 - 52.0 % 31.5  30.7  29.8   Platelets 150 - 400 K/uL 406  396  363       Latest Ref Rng & Units 10/22/2023    5:04 AM 10/21/2023    4:19 AM 10/20/2023    8:23 AM  BMP  Glucose 70 - 99  mg/dL 66  92  94   BUN 8 - 23 mg/dL 22  20  23    Creatinine 0.61 - 1.24 mg/dL 8.74  8.82  8.94   Sodium 135 - 145 mmol/L 139  142  136   Potassium 3.5 - 5.1 mmol/L 4.3  4.7  3.9   Chloride 98 - 111 mmol/L 105  109  105   CO2 22 - 32 mmol/L 23  25  21    Calcium  8.9 - 10.3 mg/dL 8.2  8.2  8.1      Family Communication: Talked with caregiver on phone.  Disposition: Status is: Inpatient Remains inpatient appropriate because: treatment of his pneumonia   Planned Discharge Destination: Home with home health  DVT prophylaxis.  Lovenox  Time spent: 45 minutes  This record has been created using Conservation officer, historic buildings. Errors have been sought and corrected,but may not always be located. Such creation errors do not reflect on the standard of care.   Author: Amaryllis Dare, MD 10/22/2023 1:46 PM  For on call review [email or web address].

## 2023-10-22 NOTE — Consult Note (Signed)
 Pharmacy Antibiotic Note  George [name] is a 89 y.o. male admitted on 10/18/2023 with pneumonia. Presented to the ED with cough and shortness of breath. They have a past medical history significant for COPD (possible exacerbation on top of CAP). CXR Chest 1/11: Superimposed patchy infiltration in the lung bases, possibly pneumonia. S/p azithromycin  1/7 - 1/9. Received 5 days of ceftriaxone . With new imaging results pharmacy has been consulted for Cefepime  dosing.  Today, 10/22/23 Scr  1.25 (above baseline)  WBC 27.3 (elevated) Afebrile/24 hours   Plan: Start Cefepime  2g Q12H - renally adjusted (CrCl 40.9) Monitor renal function for dose adjustments if necessary Continue to monitor renal function and follow culture results  Height: 6' (182.9 cm) Weight: 74 kg (163 lb 2.3 oz) IBW/kg (Calculated) : 77.6  Temp (24hrs), Avg:98.4 F (36.9 C), Min:97.6 F (36.4 C), Max:98.8 F (37.1 C)  Recent Labs  Lab 10/18/23 1133 10/18/23 1237 10/18/23 1527 10/18/23 1700 10/19/23 0423 10/19/23 0500 10/20/23 0823 10/20/23 0824 10/21/23 0419 10/22/23 0504  WBC 29.5*  --   --   --  25.9* 26.4*  --  22.7* 24.3* 27.3*  CREATININE 1.55*  --   --   --  1.21  --  1.05  --  1.17 1.25*  LATICACIDVEN  --  2.5* 1.1 1.0  --   --   --   --   --   --     Estimated Creatinine Clearance: 41.9 mL/min (A) (by C-G formula based on SCr of 1.25 mg/dL (H)).    No Known Allergies  Antimicrobials this admission: S/p zithromax   S/p ceftriaxone   Dose adjustments this admission: NA  Microbiology results: 1/7 BCx: NGTD 1/11 MRSA PCR: negative  Thank you for allowing pharmacy to be a part of this patient's care.  Alfonso MARLA Buys, PharmD Pharmacy Resident  10/22/2023 1:26 PM

## 2023-10-23 DIAGNOSIS — J449 Chronic obstructive pulmonary disease, unspecified: Secondary | ICD-10-CM | POA: Diagnosis not present

## 2023-10-23 DIAGNOSIS — J189 Pneumonia, unspecified organism: Secondary | ICD-10-CM | POA: Diagnosis not present

## 2023-10-23 DIAGNOSIS — E1122 Type 2 diabetes mellitus with diabetic chronic kidney disease: Secondary | ICD-10-CM | POA: Diagnosis not present

## 2023-10-23 DIAGNOSIS — N1832 Chronic kidney disease, stage 3b: Secondary | ICD-10-CM | POA: Diagnosis not present

## 2023-10-23 LAB — GLUCOSE, CAPILLARY
Glucose-Capillary: 94 mg/dL (ref 70–99)
Glucose-Capillary: 103 mg/dL — ABNORMAL HIGH (ref 70–99)
Glucose-Capillary: 103 mg/dL — ABNORMAL HIGH (ref 70–99)

## 2023-10-23 LAB — CULTURE, BLOOD (ROUTINE X 2)
Culture: NO GROWTH
Culture: NO GROWTH
Special Requests: ADEQUATE
Special Requests: ADEQUATE

## 2023-10-23 LAB — BASIC METABOLIC PANEL
Anion gap: 12 (ref 5–15)
BUN: 23 mg/dL (ref 8–23)
CO2: 24 mmol/L (ref 22–32)
Calcium: 8.6 mg/dL — ABNORMAL LOW (ref 8.9–10.3)
Chloride: 103 mmol/L (ref 98–111)
Creatinine, Ser: 1.1 mg/dL (ref 0.61–1.24)
GFR, Estimated: >60 mL/min (ref >60)
Glucose, Bld: 90 mg/dL (ref 70–99)
Potassium: 4.5 mmol/L (ref 3.5–5.1)
Sodium: 139 mmol/L (ref 135–145)

## 2023-10-23 LAB — TECHNOLOGIST SMEAR REVIEW: Plt Morphology: NORMAL

## 2023-10-23 LAB — CBC WITH DIFFERENTIAL/PLATELET
Abs Immature Granulocytes: 0.44 10*3/uL — ABNORMAL HIGH (ref 0.00–0.07)
Basophils Absolute: 0.1 10*3/uL (ref 0.0–0.1)
Basophils Relative: 0 %
Eosinophils Absolute: 0.1 10*3/uL (ref 0.0–0.5)
Eosinophils Relative: 0 %
HCT: 31.9 % — ABNORMAL LOW (ref 39.0–52.0)
Hemoglobin: 10.5 g/dL — ABNORMAL LOW (ref 13.0–17.0)
Immature Granulocytes: 1 %
Lymphocytes Relative: 3 %
Lymphs Abs: 0.9 10*3/uL (ref 0.7–4.0)
MCH: 29.3 pg (ref 26.0–34.0)
MCHC: 32.9 g/dL (ref 30.0–36.0)
MCV: 89.1 fL (ref 80.0–100.0)
Monocytes Absolute: 1.9 10*3/uL — ABNORMAL HIGH (ref 0.1–1.0)
Monocytes Relative: 6 %
Neutro Abs: 27.1 10*3/uL — ABNORMAL HIGH (ref 1.7–7.7)
Neutrophils Relative %: 90 %
Platelets: 419 10*3/uL — ABNORMAL HIGH (ref 150–400)
RBC: 3.58 MIL/uL — ABNORMAL LOW (ref 4.22–5.81)
RDW: 13.6 % (ref 11.5–15.5)
Smear Review: NORMAL
WBC: 30.5 10*3/uL — ABNORMAL HIGH (ref 4.0–10.5)
nRBC: 0 % (ref 0.0–0.2)

## 2023-10-23 LAB — PROCALCITONIN: Procalcitonin: 1.01 ng/mL
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
## 2023-10-23 NOTE — Plan of Care (Signed)

## 2023-10-23 NOTE — Progress Notes (Signed)
 Physical Therapy Treatment Patient Details Name: George [name] MRN: [id number] DOB: 1931/11/05 Today's Date: 10/23/2023   History of Present Illness 89 year old male who presents to the emergency department for chief concerns of cough, shortness of breath. Admitting dx: Community acquired pneumonia. PMHx: dementia, CKD3b, GERD, hypertension, hyperlipidemia, CAD.    PT Comments  Pt is making good progress towards goals with ability to ambulate in hallway using RW. Forward flexed posture and needs cues for safety. DIstance limited by SOB symptoms. All mobility performed on RA with sats ranging from 88-90% with exertion. Pt returned back to 3L due to symptoms with O2 sats improving to 97%. Pt very HOH and responded well to writing on paper. Will continue to progress as able.   If plan is discharge home, recommend the following: A little help with walking and/or transfers;A little help with bathing/dressing/bathroom;Assist for transportation;Help with stairs or ramp for entrance;Assistance with cooking/housework;Direct supervision/assist for medications management;Direct supervision/assist for financial management   Can travel by private vehicle        Equipment Recommendations  None recommended by PT    Recommendations for Other Services       Precautions / Restrictions Precautions Precautions: Fall Restrictions Weight Bearing Restrictions Per Provider Order: No     Mobility  Bed Mobility Overal bed mobility: Needs Assistance Bed Mobility: Supine to Sit     Supine to sit: Min assist Sit to supine: Supervision   General bed mobility comments: reaches for therapist. Assist given for trunkal elevation. Once seated at EOB, upright posture noted.    Transfers Overall transfer level: Needs assistance Equipment used: Rolling walker (2 wheels) Transfers: Sit to/from Stand Sit to Stand: Contact guard assist           General transfer comment: safe technique with pt able to  push from seated surface. Once standing, slightly flexed posture.    Ambulation/Gait Ambulation/Gait assistance: Contact guard assist Gait Distance (Feet): 70 Feet Assistive device: Rolling walker (2 wheels) Gait Pattern/deviations: Step-through pattern, Decreased step length - right, Decreased step length - left, Decreased stride length, Trunk flexed       General Gait Details: Distance limited by SOB symptoms. RW used. All mobility performed on RA with sats at 88%-90% with exertion. Unsteady with forward flexed posture   Stairs             Wheelchair Mobility     Tilt Bed    Modified Rankin (Stroke Patients Only)       Balance Overall balance assessment: Needs assistance Sitting-balance support: Feet supported Sitting balance-Leahy Scale: Good     Standing balance support: During functional activity, Reliant on assistive device for balance, Single extremity supported Standing balance-Leahy Scale: Fair                              Cognition Arousal: Alert Behavior During Therapy: WFL for tasks assessed/performed Overall Cognitive Status: History of cognitive impairments - at baseline                                 General Comments: oriented to person; very HOH and per pt and caregiver his memory is gone        Exercises      General Comments        Pertinent Vitals/Pain Pain Assessment Pain Assessment: No/denies pain    Home Living  Prior Function            PT Goals (current goals can now be found in the care plan section) Acute Rehab PT Goals Patient Stated Goal: return home with caregiver PT Goal Formulation: With patient Time For Goal Achievement: 11/02/23 Potential to Achieve Goals: Good Progress towards PT goals: Progressing toward goals    Frequency    Min 1X/week      PT Plan      Co-evaluation              AM-PAC PT 6 Clicks Mobility   Outcome  Measure  Help needed turning from your back to your side while in a flat bed without using bedrails?: A Little Help needed moving from lying on your back to sitting on the side of a flat bed without using bedrails?: A Little Help needed moving to and from a bed to a chair (including a wheelchair)?: A Little Help needed standing up from a chair using your arms (e.g., wheelchair or bedside chair)?: A Little Help needed to walk in hospital room?: A Little Help needed climbing 3-5 steps with a railing? : A Lot 6 Click Score: 17    End of Session Equipment Utilized During Treatment: Gait belt;Oxygen Activity Tolerance: Patient limited by fatigue;Other (comment) Patient left: in bed;with call bell/phone within reach;with bed alarm set Nurse Communication: Mobility status PT Visit Diagnosis: Other abnormalities of gait and mobility (R26.89);Muscle weakness (generalized) (M62.81);Unsteadiness on feet (R26.81);Difficulty in walking, not elsewhere classified (R26.2)     Time: 8487-8469 PT Time Calculation (min) (ACUTE ONLY): 18 min  Charges:    $Gait Training: 8-22 mins PT General Charges $$ ACUTE PT VISIT: 1 Visit                     George [name], PT, DPT, GCS [phone]    George [name] 10/23/2023, 4:07 PM

## 2023-10-23 NOTE — Progress Notes (Signed)
 Progress Note   Patient: George [name] FMW:[id number] DOB: 11/26/1931 DOA: 10/18/2023     5 DOS: the patient was seen and examined on 10/23/2023   Brief hospital course: George [name] is a 89 year old male with history of dementia, CKD stage IIIb baseline, GERD, hypertension, hyperlipidemia, CAD, who presents to the emergency department for chief concerns of cough, shortness of breath.  Vitals in the ED showed temperature 98.1, respiration rate 18, heart rate 75, blood pressure 105/49, improved to 121/62, SpO2 95% on room air.  Serum sodium is 139, potassium 4.1, chloride 108, bicarb 20, BUN of 43, serum creatinine 1.55, EGFR 43, nonfasting blood glucose 128, WBC 29.5, hemoglobin 11.1, platelets of 391.  UA has been ordered and is pending collection.  Blood cultures x 2 are in process.  Patient was started on ceftriaxone  and Zithromax  for concern of pneumonia.  Procalcitonin was elevated.  1/10: Vitals and labs stable, iron studies with low iron and TIBC, elevated ferritin, more consistent with anemia of chronic disease.  A1c of 5.8.  Slight worsening of leukocytosis at 24.3, remained on 3 L of oxygen. PT with no follow-up recommendation, patient has 24/7 care available but apparently his caregiver is sick today.  1/11: Remained afebrile but worsening procalcitonin at 1.04 and leukocytosis at 27.3, slight increase in creatinine.  MRSA PCR negative, giving some IV  fluid and Switching antibiotics to cefepime .  Repeat chest x-ray with basal pneumonia and small pleural effusions. Will try to get sputum culture.  1/12: Worsening leukocytosis, stable procalcitonin.  Smear with some toxic granulations.  Oncology was also consulted.  Continuing current management.  Assessment and Plan: Community acquired pneumonia COPD exacerbation, mild  Leukocytosis  Repeat chest x-ray with persistent basilar pneumonia, worsening leukocytosis and stable procalcitonin.  MRSA PCR negative.  Smear with  left shift and toxic granulations Received ceftriaxone  and Zithromax  before, switching to cefepime . Sputum cultures Also consulted hematology to rule out any other he underlying hematologic disorder causing worsening leukocytosis  Type II diabetes mellitus with renal manifestations (HCC) Well controlled.  CBG remained within goal and he was not needing any insulin  so it was discontinued  Normocytic anemia  Stable, iron panel with mild iron deficiency and consistent with anemia of chronic disease  Hearing loss Bilateral Moderate to severe  Weakness Caregiver notice weakness around the onset of worsening cough about 5d prior to admission PT, OT recommending continued assistance at home   Dementia [facility]) Baseline unknown Fall and aspiration precautions  Anxiety Complicated by baseline dementia If becomes agitated will start night time seroquel   Hypertension Continue  home verapamil    Hyperlipidemia Simvastatin  10 mg nightly resumed  CKD3a. Stable.    Subjective: Patient appears confused and telling very irrelevant stories.  Oriented to name only.  Denies any pain  Physical Exam: Vitals:   10/22/23 2100 10/23/23 0343 10/23/23 0907 10/23/23 1208  BP: (!) 152/80 130/62 (!) 145/65 (!) 143/65  Pulse: 90 76 91 74  Resp:   18 16  Temp: (!) 97.1 F (36.2 C) 98.4 F (36.9 C) 98.4 F (36.9 C) 97.7 F (36.5 C)  TempSrc:  Oral  Oral  SpO2: 95% 94% 93% 100%  Weight:      Height:       General.  Frail and very hard of hearing elderly man, in no acute distress. Pulmonary.  Lungs clear bilaterally, normal respiratory effort. CV.  Regular rate and rhythm, no JVD, rub or murmur. Abdomen.  Soft, nontender, nondistended, BS positive. CNS.  Alert and oriented to name only.  No focal neurologic deficit. Extremities.  No edema, no cyanosis, pulses intact and symmetrical.  Data Reviewed:  I have reviewed all images and labs.      Latest Ref Rng & Units 10/23/2023    9:05 AM  10/22/2023    5:04 AM 10/21/2023    4:19 AM  CBC  WBC 4.0 - 10.5 K/uL 30.5  27.3  24.3   Hemoglobin 13.0 - 17.0 g/dL 89.4  89.6  89.9   Hematocrit 39.0 - 52.0 % 31.9  31.5  30.7   Platelets 150 - 400 K/uL 419  406  396       Latest Ref Rng & Units 10/23/2023    9:05 AM 10/22/2023    5:04 AM 10/21/2023    4:19 AM  BMP  Glucose 70 - 99 mg/dL 90  66  92   BUN 8 - 23 mg/dL 23  22  20    Creatinine 0.61 - 1.24 mg/dL 8.89  8.74  8.82   Sodium 135 - 145 mmol/L 139  139  142   Potassium 3.5 - 5.1 mmol/L 4.5  4.3  4.7   Chloride 98 - 111 mmol/L 103  105  109   CO2 22 - 32 mmol/L 24  23  25    Calcium  8.9 - 10.3 mg/dL 8.6  8.2  8.2      Family Communication: Talked with caregiver on phone.  Disposition: Status is: Inpatient Remains inpatient appropriate because: treatment of his pneumonia   Planned Discharge Destination: Home with home health  DVT prophylaxis.  Lovenox  Time spent: 44 minutes  This record has been created using Conservation officer, historic buildings. Errors have been sought and corrected,but may not always be located. Such creation errors do not reflect on the standard of care.   Author: Amaryllis Dare, MD 10/23/2023 1:04 PM  For on call review [email or web address].

## 2023-10-24 DIAGNOSIS — J189 Pneumonia, unspecified organism: Secondary | ICD-10-CM | POA: Diagnosis not present

## 2023-10-24 DIAGNOSIS — E1122 Type 2 diabetes mellitus with diabetic chronic kidney disease: Secondary | ICD-10-CM | POA: Diagnosis not present

## 2023-10-24 DIAGNOSIS — N1832 Chronic kidney disease, stage 3b: Secondary | ICD-10-CM | POA: Diagnosis not present

## 2023-10-24 DIAGNOSIS — I10 Essential (primary) hypertension: Secondary | ICD-10-CM | POA: Diagnosis not present

## 2023-10-24 LAB — CBC WITH DIFFERENTIAL/PLATELET
Abs Immature Granulocytes: 0.62 10*3/uL — ABNORMAL HIGH (ref 0.00–0.07)
Basophils Absolute: 0.1 10*3/uL (ref 0.0–0.1)
Basophils Relative: 0 %
Eosinophils Absolute: 0.1 10*3/uL (ref 0.0–0.5)
Eosinophils Relative: 0 %
HCT: 31.2 % — ABNORMAL LOW (ref 39.0–52.0)
Hemoglobin: 10.5 g/dL — ABNORMAL LOW (ref 13.0–17.0)
Immature Granulocytes: 2 %
Lymphocytes Relative: 4 %
Lymphs Abs: 1.1 10*3/uL (ref 0.7–4.0)
MCH: 29.3 pg (ref 26.0–34.0)
MCHC: 33.7 g/dL (ref 30.0–36.0)
MCV: 87.2 fL (ref 80.0–100.0)
Monocytes Absolute: 1.9 10*3/uL — ABNORMAL HIGH (ref 0.1–1.0)
Monocytes Relative: 6 %
Neutro Abs: 25.8 10*3/uL — ABNORMAL HIGH (ref 1.7–7.7)
Neutrophils Relative %: 88 %
Platelets: 380 10*3/uL (ref 150–400)
RBC: 3.58 MIL/uL — ABNORMAL LOW (ref 4.22–5.81)
RDW: 13.7 % (ref 11.5–15.5)
Smear Review: NORMAL
WBC: 29.6 10*3/uL — ABNORMAL HIGH (ref 4.0–10.5)
nRBC: 0 % (ref 0.0–0.2)

## 2023-10-24 LAB — GLUCOSE, CAPILLARY
Glucose-Capillary: 98 mg/dL (ref 70–99)
Glucose-Capillary: 86 mg/dL (ref 70–99)

## 2023-10-24 MED ORDER — LEVOFLOXACIN 750 MG PO TABS: 750.0000 mg | ORAL_TABLET | ORAL | 0 refills | Status: AC

## 2023-10-24 MED ORDER — LEVOFLOXACIN 750 MG PO TABS: 750.0000 mg | ORAL_TABLET | ORAL | 0 refills | Status: DC

## 2023-10-24 MED ORDER — LEVOFLOXACIN 500 MG PO TABS
750.0000 mg | ORAL_TABLET | ORAL | Status: DC
  Administered 2023-10-24: 750 mg via ORAL
  Filled 2023-10-24: qty 2

## 2023-10-24 NOTE — Progress Notes (Signed)
 Physical Therapy Treatment Patient Details Name: George [name] MRN: [id number] DOB: 10-Aug-1932 Today's Date: 10/24/2023   History of Present Illness 89 year old male who presents to the emergency department for chief concerns of cough, shortness of breath. Admitting dx: Community acquired pneumonia. PMHx: dementia, CKD3b, GERD, hypertension, hyperlipidemia, CAD.    PT Comments  Patient confused but agreeable to PT. He is fatigued with activity. Patient able to stand x 2 bouts with assistance. Standing tolerance limited for progression of ambulation at this time. Patient will need physical assistance with mobility at discharge. PT will continue to follow to maximize independence and decrease caregiver burden.    If plan is discharge home, recommend the following: A little help with walking and/or transfers;A little help with bathing/dressing/bathroom;Assist for transportation;Help with stairs or ramp for entrance;Assistance with cooking/housework;Direct supervision/assist for medications management;Direct supervision/assist for financial management   Can travel by private vehicle        Equipment Recommendations  None recommended by PT    Recommendations for Other Services       Precautions / Restrictions Precautions Precautions: Fall Restrictions Weight Bearing Restrictions Per Provider Order: No     Mobility  Bed Mobility Overal bed mobility: Needs Assistance Bed Mobility: Supine to Sit, Sit to Supine     Supine to sit: Min assist Sit to supine: Min assist   General bed mobility comments: assistance for LE and trunk support. verbal cues for technique    Transfers Overall transfer level: Needs assistance Equipment used: Rolling walker (2 wheels) Transfers: Sit to/from Stand Sit to Stand: Min assist           General transfer comment: 2 standing bouts performed with lifting assistance provided. cues for technique and task initiation. patient is fatigued with minimal  activity    Ambulation/Gait             Pre-gait activities: patient able to take 2 small side steps to the right with rolling walker for support. further ambulation limited by fatigue     Stairs             Wheelchair Mobility     Tilt Bed    Modified Rankin (Stroke Patients Only)       Balance Overall balance assessment: Needs assistance Sitting-balance support: Feet supported Sitting balance-Leahy Scale: Good     Standing balance support: During functional activity Standing balance-Leahy Scale: Poor Standing balance comment: external support required                            Cognition Arousal: Alert Behavior During Therapy: Anxious Overall Cognitive Status: History of cognitive impairments - at baseline                                 General Comments: Patient is able to state his name. He is disoriented to place, time, situation and is very HOH. Patient needs multi modal cues for commands following due to [facility]        Exercises      General Comments General comments (skin integrity, edema, etc.): Sp02 90's on room air with mobility. heart rate in the 80's after standing activity. patient is fatigued with activity      Pertinent Vitals/Pain Pain Assessment Pain Assessment: No/denies pain    Home Living  Prior Function            PT Goals (current goals can now be found in the care plan section) Acute Rehab PT Goals Patient Stated Goal: patient unable to state PT Goal Formulation: With patient Time For Goal Achievement: 11/02/23 Potential to Achieve Goals: Good Progress towards PT goals: Progressing toward goals    Frequency    Min 1X/week      PT Plan      Co-evaluation              AM-PAC PT 6 Clicks Mobility   Outcome Measure  Help needed turning from your back to your side while in a flat bed without using bedrails?: A Little Help needed moving from  lying on your back to sitting on the side of a flat bed without using bedrails?: A Little Help needed moving to and from a bed to a chair (including a wheelchair)?: A Little Help needed standing up from a chair using your arms (e.g., wheelchair or bedside chair)?: A Little Help needed to walk in hospital room?: A Little Help needed climbing 3-5 steps with a railing? : A Lot 6 Click Score: 17    End of Session   Activity Tolerance: Patient limited by fatigue Patient left: in bed;with call bell/phone within reach;with bed alarm set   PT Visit Diagnosis: Other abnormalities of gait and mobility (R26.89);Muscle weakness (generalized) (M62.81);Unsteadiness on feet (R26.81);Difficulty in walking, not elsewhere classified (R26.2)     Time: 8954-8884 PT Time Calculation (min) (ACUTE ONLY): 30 min  Charges:    $Therapeutic Activity: 23-37 mins PT General Charges $$ ACUTE PT VISIT: 1 Visit                     George [name], PT, MPT    George [name] 10/24/2023, 11:31 AM

## 2023-10-24 NOTE — Plan of Care (Signed)

## 2023-10-24 NOTE — Discharge Summary (Addendum)
 Physician Discharge Summary   Patient: George [name] MRN: [id number] DOB: 1931-11-14  Admit date:     10/18/2023  Discharge date: 10/24/23  Discharge Physician: Amaryllis Dare   PCP: Glover Lenis, MD   Recommendations at discharge:  Please obtain CBC and BMP on follow-up Hematology labs results are pending-follow-up with hematology for results in 1 week Follow-up with primary care provider within a week  Discharge Diagnoses: Principal Problem:   Community acquired pneumonia Active Problems:   COPD (chronic obstructive pulmonary disease) (HCC)   Type II diabetes mellitus with renal manifestations (HCC)   CKD stage 3b, GFR 30-44 ml/min (HCC)   DNR (do not resuscitate)   CAD (coronary artery disease)   Hyperlipidemia   Hypertension   Anxiety   Dementia (HCC)   Weakness   Hearing loss   Hospital Course: George [name] is a 89 year old male with history of dementia, CKD stage IIIb baseline, GERD, hypertension, hyperlipidemia, CAD, who presents to the emergency department for chief concerns of cough, shortness of breath.  Vitals in the ED showed temperature 98.1, respiration rate 18, heart rate 75, blood pressure 105/49, improved to 121/62, SpO2 95% on room air.  Serum sodium is 139, potassium 4.1, chloride 108, bicarb 20, BUN of 43, serum creatinine 1.55, EGFR 43, nonfasting blood glucose 128, WBC 29.5, hemoglobin 11.1, platelets of 391.  UA has been ordered and is pending collection.  Blood cultures x 2 are in process.  Patient was started on ceftriaxone  and Zithromax  for concern of pneumonia.  Procalcitonin was elevated.  1/10: Vitals and labs stable, iron studies with low iron and TIBC, elevated ferritin, more consistent with anemia of chronic disease.  A1c of 5.8.  Slight worsening of leukocytosis at 24.3, remained on 3 L of oxygen. PT with no follow-up recommendation, patient has 24/7 care available but apparently his caregiver is sick today.  1/11: Remained  afebrile but worsening procalcitonin at 1.04 and leukocytosis at 27.3, slight increase in creatinine.  MRSA PCR negative, giving some IV  fluid and Switching antibiotics to cefepime .  Repeat chest x-ray with basal pneumonia and small pleural effusions. Will try to get sputum culture, unable to obtain any sputum.  1/12: Worsening leukocytosis, stable procalcitonin.  Smear with some toxic granulations.  Oncology was also consulted.  Continuing current management.  They ordered few labs and they will discuss results as outpatient.  1/13: Some improvement in leukocytosis, remained hemodynamically stable.  Oncology ordered BCR-ABL1 FISH and flow cytometry.  Patient overall stable and wants to go home.  He is being discharged on 1 more doses of Levaquin  which he will take on 10/26/2023.  Patient will follow-up with oncology for lab results.  He will continue on current management and need to have a close follow-up with his providers for further management.   Consultants: Hematology Procedures performed: None Disposition: Home Diet recommendation:  Discharge Diet Orders (From admission, onward)     Start     Ordered   10/24/23 0000  Diet - low sodium heart healthy        10/24/23 1119           Cardiac diet DISCHARGE MEDICATION: Allergies as of 10/24/2023   No Known Allergies      Medication List     STOP taking these medications    acidophilus Caps capsule   colchicine  0.6 MG tablet   cyanocobalamin  1000 MCG/ML injection Commonly known as: VITAMIN B12   HYDROcodone -acetaminophen  5-325 MG tablet Commonly known as: NORCO/VICODIN  hyoscyamine  0.125 MG tablet Commonly known as: LEVSIN    traMADol  50 MG tablet Commonly known as: ULTRAM        TAKE these medications    albuterol  108 (90 Base) MCG/ACT inhaler Commonly known as: VENTOLIN  HFA Inhale 1-2 puffs into the lungs every 6 (six) hours as needed for wheezing or shortness of breath.   albuterol  (2.5 MG/3ML)  0.083% nebulizer solution Commonly known as: PROVENTIL  Take 2.5 mg by nebulization every 6 (six) hours as needed for wheezing.   aspirin  EC 81 MG tablet Take 81 mg by mouth daily. Swallow whole.   levofloxacin  750 MG tablet Commonly known as: LEVAQUIN  Take 1 tablet (750 mg total) by mouth every other day for 1 dose. To be given on 1/15 Start taking on: October 26, 2023   omeprazole 20 MG capsule Commonly known as: PRILOSEC Take 20 mg by mouth every morning.   simvastatin  20 MG tablet Commonly known as: ZOCOR  Take 10 mg by mouth at bedtime.   Trelegy Ellipta 100-62.5-25 MCG/INH Aepb Generic drug: Fluticasone -Umeclidin-Vilant Inhale 1 puff into the lungs every morning.   verapamil  240 MG CR tablet Commonly known as: CALAN -SR Take 240 mg by mouth 2 (two) times daily.               Durable Medical Equipment  (From admission, onward)           Start     Ordered   10/21/23 0902  For home use only DME oxygen  Once       Question Answer Comment  Length of Need Lifetime   Mode or (Route) Nasal cannula   Liters per Minute 2   Frequency Continuous (stationary and portable oxygen unit needed)   Oxygen conserving device Yes   Oxygen delivery system Gas      10/21/23 0901            Follow-up Information     Glover Lenis, MD. Schedule an appointment as soon as possible for a visit in 1 week(s).   Specialty: Family Medicine Contact information: [address] [place] [address] [phone]         Agrawal, Kavita, MD. Schedule an appointment as soon as possible for a visit in 1 week(s).   Specialty: Oncology Contact information: [address] [place] [address] [phone]                Discharge Exam: Fredricka Weights   10/18/23 1131  Weight: 74 kg   General.  Frail and very hard of hearing elderly man, in no acute distress. Pulmonary.  Lungs clear bilaterally, normal respiratory effort. CV.  Regular rate and rhythm, no  JVD, rub or murmur. Abdomen.  Soft, nontender, nondistended, BS positive. CNS.  Alert and oriented .  No focal neurologic deficit. Extremities.  No edema, no cyanosis, pulses intact and symmetrical.  Condition at discharge: stable  The results of significant diagnostics from this hospitalization (including imaging, microbiology, ancillary and laboratory) are listed below for reference.   Imaging Studies: DG Chest 2 View Result Date: 10/22/2023 CLINICAL DATA:  Shortness of breath.  Cough. EXAM: CHEST - 2 VIEW COMPARISON:  10/18/2023 FINDINGS: Heart size and pulmonary vascularity are normal. Calcification of the aorta. Mediastinal contours appear intact. Emphysematous changes in the lungs. Small bilateral pleural effusions. Linear scarring and bronchiectasis in the lungs consistent with chronic lung disease. Patchy infiltrates in the lung bases may represent superimposed pneumonia. Similar appearance to previous study. Degenerative changes in the spine and  right shoulder. Postoperative change in the left shoulder. IMPRESSION: 1. Emphysematous changes and chronic scarring in the lungs. 2. Superimposed patchy infiltration in the lung bases, possibly pneumonia. 3. Small bilateral pleural effusions. Electronically Signed   By: Elsie Gravely M.D.   On: 10/22/2023 12:23   DG Chest 2 View Result Date: 10/18/2023 CLINICAL DATA:  89 year old male with cough and leukocytosis. EXAM: CHEST - 2 VIEW COMPARISON:  Chest radiographs 10/16/2023 and earlier. FINDINGS: Seated AP and lateral views of chest 1148 hours. Underlying emphysema demonstrated by CT in August. Left upper lung architectural distortion appears grossly stable since that time. Patchy bilateral lower lung opacity persists, more confluent on the right. Some of this appears to be middle lobe involvement on the lateral. No pleural effusion. Mediastinal contours remain within normal limits. No pulmonary edema or pneumothorax. Left shoulder arthroplasty. No  acute osseous abnormality identified. Negative visible bowel gas. IMPRESSION: Chronic lung disease with Emphysema (ICD10-J43.9). Patchy opacity now in both lower lungs suspicious for acute infectious exacerbation. No pleural effusion. Electronically Signed   By: VEAR Hurst M.D.   On: 10/18/2023 12:08   DG Chest 2 View Result Date: 10/16/2023 CLINICAL DATA:  Cough EXAM: CHEST - 2 VIEW COMPARISON:  X-ray 02/14/2023.  CT chest 05/25/2023. FINDINGS: No pneumothorax or effusion. Subtle opacity in the left lung base. Acute infiltrates possible. There is persistent nodular area in the left upper lobe as seen on prior CT scan. No pneumothorax or effusion. Normal cardiopericardial silhouette. Degenerative changes of the spine. Calcified aorta. Left shoulder arthroplasty. IMPRESSION: Subtle opacity in the left lung base. Possible infiltrate. Recommend follow-up. Persistent nodular areas in the left upper lobe, similar to the prior CT scan. Electronically Signed   By: Ranell Bring M.D.   On: 10/16/2023 11:44    Microbiology: Results for orders placed or performed during the hospital encounter of 10/18/23  Blood Culture (routine x 2)     Status: None   Collection Time: 10/18/23 12:26 PM   Specimen: BLOOD  Result Value Ref Range Status   Specimen Description BLOOD LEFT ANTECUBITAL  Final   Special Requests   Final    BOTTLES DRAWN AEROBIC AND ANAEROBIC Blood Culture adequate volume   Culture   Final    NO GROWTH 5 DAYS Performed at [facility], [address] [place], [place] [address]    Report Status 10/23/2023 FINAL  Final  Blood Culture (routine x 2)     Status: None   Collection Time: 10/18/23 12:37 PM   Specimen: BLOOD  Result Value Ref Range Status   Specimen Description BLOOD RIGHT ANTECUBITAL  Final   Special Requests   Final    BOTTLES DRAWN AEROBIC AND ANAEROBIC Blood Culture adequate volume   Culture   Final    NO GROWTH 5 DAYS Performed at [facility], [address] [place], [place] [address]    Report Status 10/23/2023 FINAL  Final  MRSA Next Gen by PCR, Nasal     Status: None   Collection Time: 10/22/23 10:53 AM   Specimen: Nasal Mucosa; Nasal Swab  Result Value Ref Range Status   MRSA by PCR Next Gen NOT DETECTED NOT DETECTED Final    Comment: (NOTE) The GeneXpert MRSA Assay (FDA approved for NASAL specimens only), is one component of a comprehensive MRSA colonization surveillance program. It is not intended to diagnose MRSA infection nor to guide or monitor treatment for MRSA infections. Test performance is not FDA approved in patients less  than 76 years old. Performed at [facility], [address] [place], [place] [address]     Labs: CBC: Recent Labs  Lab 10/19/23 0500 10/20/23 9175 10/21/23 0419 10/22/23 0504 10/23/23 0905 10/24/23 0609  WBC 26.4* 22.7* 24.3* 27.3* 30.5* 29.6*  NEUTROABS 23.5* 19.7* 21.4*  --  27.1* 25.8*  HGB 9.9* 10.3* 10.0* 10.3* 10.5* 10.5*  HCT 30.8* 29.8* 30.7* 31.5* 31.9* 31.2*  MCV 92.2 88.2 91.4 92.4 89.1 87.2  PLT 382 363 396 406* 419* 380   Basic Metabolic Panel: Recent Labs  Lab 10/19/23 0423 10/20/23 0823 10/21/23 0419 10/22/23 0504 10/23/23 0905  NA 140 136 142 139 139  K 3.9 3.9 4.7 4.3 4.5  CL 108 105 109 105 103  CO2 21* 21* 25 23 24   GLUCOSE 91 94 92 66* 90  BUN 31* 23 20 22 23   CREATININE 1.21 1.05 1.17 1.25* 1.10  CALCIUM  8.2* 8.1* 8.2* 8.2* 8.6*   Liver Function Tests: No results for input(s): AST, ALT, ALKPHOS, BILITOT, PROT, ALBUMIN in the last 168 hours. CBG: Recent Labs  Lab 10/22/23 2100 10/23/23 1035 10/23/23 1211 10/23/23 2202 10/24/23 0744  GLUCAP 98 94 103* 103* 98    Discharge time spent: greater than 30 minutes.  This record has been created using Conservation officer, historic buildings. Errors have been sought and corrected,but may not always be located. Such creation errors do not reflect on the standard of care.   Signed: Amaryllis Dare, MD [facility] 10/24/2023

## 2023-10-24 NOTE — Care Management Important Message (Signed)
 Important Message  Patient Details  Name: ZONG MCQUARRIE MRN: [id number] Date of Birth: [birth year]   Important Message Given:  Yes - Medicare IM     Sherilyn Banker 10/24/2023, 4:15 PM

## 2023-10-26 LAB — COMP PANEL: LEUKEMIA/LYMPHOMA

## 2023-10-27 ENCOUNTER — Encounter: Payer: Medicare Other | Admitting: Oncology

## 2023-10-28 LAB — BCR-ABL1 FISH
Cells Analyzed: 200
Cells Counted: 200

## 2023-10-31 ENCOUNTER — Encounter: Payer: Self-pay | Admitting: Internal Medicine

## 2023-10-31 ENCOUNTER — Inpatient Hospital Stay: Payer: Medicare Other | Attending: Internal Medicine | Admitting: Internal Medicine

## 2023-10-31 ENCOUNTER — Inpatient Hospital Stay: Payer: Medicare Other

## 2023-10-31 VITALS — BP 108/48 | HR 108 | Temp 98.6°F | Resp 18

## 2023-10-31 DIAGNOSIS — Z85038 Personal history of other malignant neoplasm of large intestine: Secondary | ICD-10-CM | POA: Insufficient documentation

## 2023-10-31 DIAGNOSIS — D649 Anemia, unspecified: Secondary | ICD-10-CM | POA: Insufficient documentation

## 2023-10-31 DIAGNOSIS — D72829 Elevated white blood cell count, unspecified: Secondary | ICD-10-CM | POA: Diagnosis present

## 2023-10-31 DIAGNOSIS — I129 Hypertensive chronic kidney disease with stage 1 through stage 4 chronic kidney disease, or unspecified chronic kidney disease: Secondary | ICD-10-CM | POA: Diagnosis not present

## 2023-10-31 DIAGNOSIS — Z7982 Long term (current) use of aspirin: Secondary | ICD-10-CM | POA: Diagnosis not present

## 2023-10-31 DIAGNOSIS — Z79899 Other long term (current) drug therapy: Secondary | ICD-10-CM | POA: Diagnosis not present

## 2023-10-31 DIAGNOSIS — N183 Chronic kidney disease, stage 3 unspecified: Secondary | ICD-10-CM | POA: Diagnosis not present

## 2023-10-31 DIAGNOSIS — F17210 Nicotine dependence, cigarettes, uncomplicated: Secondary | ICD-10-CM | POA: Insufficient documentation

## 2023-10-31 LAB — CBC WITH DIFFERENTIAL/PLATELET
Abs Immature Granulocytes: 0.7 10*3/uL — ABNORMAL HIGH (ref 0.00–0.07)
Basophils Absolute: 0.2 10*3/uL — ABNORMAL HIGH (ref 0.0–0.1)
Basophils Relative: 0 %
Eosinophils Absolute: 0 10*3/uL (ref 0.0–0.5)
Eosinophils Relative: 0 %
HCT: 30.9 % — ABNORMAL LOW (ref 39.0–52.0)
Hemoglobin: 10.2 g/dL — ABNORMAL LOW (ref 13.0–17.0)
Immature Granulocytes: 2 %
Lymphocytes Relative: 2 %
Lymphs Abs: 0.9 10*3/uL (ref 0.7–4.0)
MCH: 29.7 pg (ref 26.0–34.0)
MCHC: 33 g/dL (ref 30.0–36.0)
MCV: 90.1 fL (ref 80.0–100.0)
Monocytes Absolute: 2.7 10*3/uL — ABNORMAL HIGH (ref 0.1–1.0)
Monocytes Relative: 7 %
Neutro Abs: 32.2 10*3/uL — ABNORMAL HIGH (ref 1.7–7.7)
Neutrophils Relative %: 89 %
Platelets: 333 10*3/uL (ref 150–400)
RBC: 3.43 MIL/uL — ABNORMAL LOW (ref 4.22–5.81)
RDW: 14.9 % (ref 11.5–15.5)
WBC: 36.6 10*3/uL — ABNORMAL HIGH (ref 4.0–10.5)
nRBC: 0 % (ref 0.0–0.2)

## 2023-10-31 LAB — VITAMIN B12: Vitamin B-12: 3890 pg/mL — ABNORMAL HIGH (ref 180–914)

## 2023-10-31 LAB — FOLATE: Folate: 7.7 ng/mL (ref >5.9)

## 2023-10-31 NOTE — Progress Notes (Signed)
Patient was in the hospital with pneumonia about 2 weeks ago, and since coming out of the hospital he hasn't had an appetite, his left hand and left leg has been swollen, and his CMA notices when he does eat it looks like he is having a hard time getting the food down.

## 2023-11-07 ENCOUNTER — Other Ambulatory Visit: Payer: Self-pay | Admitting: Internal Medicine

## 2023-11-07 ENCOUNTER — Inpatient Hospital Stay: Payer: Medicare Other

## 2023-11-07 DIAGNOSIS — D72829 Elevated white blood cell count, unspecified: Secondary | ICD-10-CM | POA: Diagnosis not present

## 2023-11-07 DIAGNOSIS — D649 Anemia, unspecified: Secondary | ICD-10-CM

## 2023-11-07 LAB — CBC WITH DIFFERENTIAL/PLATELET
Abs Immature Granulocytes: 1.04 10*3/uL — ABNORMAL HIGH (ref 0.00–0.07)
Basophils Absolute: 0.2 10*3/uL — ABNORMAL HIGH (ref 0.0–0.1)
Basophils Relative: 0 %
Eosinophils Absolute: 0 10*3/uL (ref 0.0–0.5)
Eosinophils Relative: 0 %
HCT: 28.3 % — ABNORMAL LOW (ref 39.0–52.0)
Hemoglobin: 9.3 g/dL — ABNORMAL LOW (ref 13.0–17.0)
Immature Granulocytes: 2 %
Lymphocytes Relative: 2 %
Lymphs Abs: 0.7 10*3/uL (ref 0.7–4.0)
MCH: 29.1 pg (ref 26.0–34.0)
MCHC: 32.9 g/dL (ref 30.0–36.0)
MCV: 88.4 fL (ref 80.0–100.0)
Monocytes Absolute: 1.6 10*3/uL — ABNORMAL HIGH (ref 0.1–1.0)
Monocytes Relative: 4 %
Neutro Abs: 39.1 10*3/uL — ABNORMAL HIGH (ref 1.7–7.7)
Neutrophils Relative %: 92 %
Platelets: 369 10*3/uL (ref 150–400)
RBC: 3.2 MIL/uL — ABNORMAL LOW (ref 4.22–5.81)
RDW: 15.3 % (ref 11.5–15.5)
WBC: 42.7 10*3/uL — ABNORMAL HIGH (ref 4.0–10.5)
nRBC: 0 % (ref 0.0–0.2)

## 2023-11-07 LAB — COMPREHENSIVE METABOLIC PANEL
ALT: 36 U/L (ref 0–44)
AST: 52 U/L — ABNORMAL HIGH (ref 15–41)
Albumin: 2.3 g/dL — ABNORMAL LOW (ref 3.5–5.0)
Alkaline Phosphatase: 537 U/L — ABNORMAL HIGH (ref 38–126)
Anion gap: 12 (ref 5–15)
BUN: 48 mg/dL — ABNORMAL HIGH (ref 8–23)
CO2: 21 mmol/L — ABNORMAL LOW (ref 22–32)
Calcium: 8.5 mg/dL — ABNORMAL LOW (ref 8.9–10.3)
Chloride: 101 mmol/L (ref 98–111)
Creatinine, Ser: 1.95 mg/dL — ABNORMAL HIGH (ref 0.61–1.24)
GFR, Estimated: 32 mL/min — ABNORMAL LOW (ref >60)
Glucose, Bld: 137 mg/dL — ABNORMAL HIGH (ref 70–99)
Potassium: 4.5 mmol/L (ref 3.5–5.1)
Sodium: 134 mmol/L — ABNORMAL LOW (ref 135–145)
Total Bilirubin: 1.2 mg/dL (ref 0.0–1.2)
Total Protein: 5.4 g/dL — ABNORMAL LOW (ref 6.5–8.1)

## 2023-11-07 NOTE — Progress Notes (Signed)
[facility]  [phone] [phone]  ID: George [name] OB: Jul 25, 1932  MR#: [id number]  AOZ#:[id number]  Patient Care Team: [name], MD as PCP - General (Family Medicine) Michaelyn Barter, MD as Consulting Physician (Oncology) Michaelyn Barter, MD as Consulting Physician (Hematology and Oncology)  REFERRING PROVIDER: Dr. Nelson Chimes  REASON FOR REFERRAL: Leukocytosis  HPI: George [name] is a 89 y.o. male with past medical history of dementia, anemia, BPH, COPD, CAD status post stent, GERD, hypertension, hyperlipidemia, OSA referred to hematology for leukocytosis.  Admitted from 10/18/2023 to 10/24/2023 at [facility] for pneumonia.  He was treated with ceftriaxone and azithromycin.  On admission, WBC was 27.7.  With progressive worsening of leukocytosis, hematology was consulted.  On discharge, WBC was 29.6.  ANC 25.8, monocyte 1.9.  Mild left shifted with 1-5 metamyelocytes, occasional myelocytes.  Toxic granulation.  Prior history of intermittent mild leukocytosis.  Primarily ranging between normal to 17,000.  Differential showed predominant neutrophilia and monocytosis.  Mild anemia with hemoglobin between 10-11.  Patient seen today with designated caregiver.  He was in a wheelchair.  Unable to answer much questions.  Has been having bilateral lower extremity ankle swelling and left upper extremity swelling since hospital discharge.  REVIEW OF SYSTEMS:   ROS  As per HPI. Otherwise, a complete review of systems is negative.  PAST MEDICAL HISTORY: Past Medical History:  Diagnosis Date   Anemia    Arthritis    BPH (benign prostatic hyperplasia)    Cancer (HCC)    COLON   COPD (chronic obstructive pulmonary disease) (HCC)    Coronary artery disease    WITH 1 STENT   Dyspnea    WITH EXERTION ONLY   GERD (gastroesophageal reflux disease)    HOH (hard of hearing)    Hypercholesteremia    Hypertension    Pneumonia 06/2021   Sleep apnea     HAD UPPP    PAST SURGICAL HISTORY: Past Surgical History:  Procedure Laterality Date   CARDIAC CATHETERIZATION     has a stent   COLON SURGERY     DUE TO COLON CANCER   COLONOSCOPY     MULTIPLE   EYE SURGERY Bilateral    CATARACT   HERNIA REPAIR     HOLEP-LASER ENUCLEATION OF THE PROSTATE WITH MORCELLATION N/A 08/21/2021   Procedure: HOLEP-LASER ENUCLEATION OF THE PROSTATE WITH MORCELLATION;  Surgeon: Sondra Come, MD;  Location: [facility] ORS;  Service: Urology;  Laterality: N/A;   REVERSE SHOULDER ARTHROPLASTY Left 02/10/2021   Procedure: REVERSE SHOULDER ARTHROPLASTY;  Surgeon: Christena Flake, MD;  Location: [facility] ORS;  Service: Orthopedics;  Laterality: Left;   TONSILLECTOMY     UVULOPALATOPHARYNGOPLASTY      FAMILY HISTORY: Family History  Problem Relation Age of Onset   Prostate cancer Neg Hx    Bladder Cancer Neg Hx    Kidney cancer Neg Hx     HEALTH MAINTENANCE: Social History   Tobacco Use   Smoking status: Every Day    Current packs/day: 1.50    Average packs/day: 1.5 packs/day for 60.0 years (90.0 ttl pk-yrs)    Types: Cigarettes   Smokeless tobacco: Never  Vaping Use   Vaping status: Never Used  Substance Use Topics   Alcohol use: Not Currently    Comment: occ WINE   Drug use: No     No Known Allergies  Current Outpatient Medications  Medication Sig Dispense Refill   albuterol (PROVENTIL) (2.5 MG/3ML) 0.083% nebulizer solution Take  2.5 mg by nebulization every 6 (six) hours as needed for wheezing.     albuterol (VENTOLIN HFA) 108 (90 Base) MCG/ACT inhaler Inhale 1-2 puffs into the lungs every 6 (six) hours as needed for wheezing or shortness of breath. (Patient not taking: Reported on 10/18/2023)     aspirin EC 81 MG tablet Take 81 mg by mouth daily. Swallow whole.     Fluticasone-Umeclidin-Vilant (TRELEGY ELLIPTA) 100-62.5-25 MCG/INH AEPB Inhale 1 puff into the lungs every morning.     omeprazole (PRILOSEC) 20 MG capsule Take 20 mg by mouth every  morning.     simvastatin (ZOCOR) 20 MG tablet Take 10 mg by mouth at bedtime.     verapamil (CALAN-SR) 240 MG CR tablet Take 240 mg by mouth 2 (two) times daily.     No current facility-administered medications for this visit.    OBJECTIVE: Vitals:   10/31/23 1140  BP: (!) 108/48  Pulse: (!) 108  Resp: 18  Temp: 98.6 F (37 C)  SpO2: 95%     There is no height or weight on file to calculate BMI.      General: Well-developed, well-nourished, no acute distress. Eyes: Pink conjunctiva, anicteric sclera. HEENT: Normocephalic, moist mucous membranes, clear oropharnyx. Lungs: Clear to auscultation bilaterally. Heart: Regular rate and rhythm. No rubs, murmurs, or gallops. Abdomen: Soft, nontender, nondistended. No organomegaly noted, normoactive bowel sounds. Musculoskeletal: No edema, cyanosis, or clubbing. Neuro: Alert, answering all questions appropriately. Cranial nerves grossly intact. Skin: No rashes or petechiae noted. Psych: Normal affect. Lymphatics: No cervical, calvicular, axillary or inguinal LAD.   LAB RESULTS:  Lab Results  Component Value Date   NA 139 10/23/2023   K 4.5 10/23/2023   CL 103 10/23/2023   CO2 24 10/23/2023   GLUCOSE 90 10/23/2023   BUN 23 10/23/2023   CREATININE 1.10 10/23/2023   CALCIUM 8.6 (L) 10/23/2023   PROT 5.6 (L) 02/15/2023   ALBUMIN 2.9 (L) 02/15/2023   AST 19 02/15/2023   ALT 12 02/15/2023   ALKPHOS 86 02/15/2023   BILITOT 0.4 02/15/2023   GFRNONAA >60 10/23/2023   GFRAA 54 (L) 06/28/2019    Lab Results  Component Value Date   WBC 36.6 (H) 10/31/2023   NEUTROABS 32.2 (H) 10/31/2023   HGB 10.2 (L) 10/31/2023   HCT 30.9 (L) 10/31/2023   MCV 90.1 10/31/2023   PLT 333 10/31/2023    Lab Results  Component Value Date   TIBC 119 (L) 10/21/2023   FERRITIN 414 (H) 10/21/2023   IRONPCTSAT 21 10/21/2023     STUDIES: DG Chest 2 View Result Date: 10/22/2023 CLINICAL DATA:  Shortness of breath.  Cough. EXAM: CHEST - 2 VIEW  COMPARISON:  10/18/2023 FINDINGS: Heart size and pulmonary vascularity are normal. Calcification of the aorta. Mediastinal contours appear intact. Emphysematous changes in the lungs. Small bilateral pleural effusions. Linear scarring and bronchiectasis in the lungs consistent with chronic lung disease. Patchy infiltrates in the lung bases may represent superimposed pneumonia. Similar appearance to previous study. Degenerative changes in the spine and right shoulder. Postoperative change in the left shoulder. IMPRESSION: 1. Emphysematous changes and chronic scarring in the lungs. 2. Superimposed patchy infiltration in the lung bases, possibly pneumonia. 3. Small bilateral pleural effusions. Electronically Signed   By: Burman Nieves M.D.   On: 10/22/2023 12:23   DG Chest 2 View Result Date: 10/18/2023 CLINICAL DATA:  89 year old male with cough and leukocytosis. EXAM: CHEST - 2 VIEW COMPARISON:  Chest radiographs 10/16/2023 and earlier. FINDINGS:  Seated AP and lateral views of chest 1148 hours. Underlying emphysema demonstrated by CT in August. Left upper lung architectural distortion appears grossly stable since that time. Patchy bilateral lower lung opacity persists, more confluent on the right. Some of this appears to be middle lobe involvement on the lateral. No pleural effusion. Mediastinal contours remain within normal limits. No pulmonary edema or pneumothorax. Left shoulder arthroplasty. No acute osseous abnormality identified. Negative visible bowel gas. IMPRESSION: Chronic lung disease with Emphysema (ICD10-J43.9). Patchy opacity now in both lower lungs suspicious for acute infectious exacerbation. No pleural effusion. Electronically Signed   By: Odessa Fleming M.D.   On: 10/18/2023 12:08   DG Chest 2 View Result Date: 10/16/2023 CLINICAL DATA:  Cough EXAM: CHEST - 2 VIEW COMPARISON:  X-ray 02/14/2023.  CT chest 05/25/2023. FINDINGS: No pneumothorax or effusion. Subtle opacity in the left lung base. Acute  infiltrates possible. There is persistent nodular area in the left upper lobe as seen on prior CT scan. No pneumothorax or effusion. Normal cardiopericardial silhouette. Degenerative changes of the spine. Calcified aorta. Left shoulder arthroplasty. IMPRESSION: Subtle opacity in the left lung base. Possible infiltrate. Recommend follow-up. Persistent nodular areas in the left upper lobe, similar to the prior CT scan. Electronically Signed   By: Karen Kays M.D.   On: 10/16/2023 11:44    ASSESSMENT AND PLAN:   GUERIN LASHOMB is a 89 y.o. male with pmh of dementia, anemia, BPH, COPD, CAD status post stent, GERD, hypertension, hyperlipidemia, OSA referred to hematology for leukocytosis.  # Leukocytosis -Could be related to acute infectious process.  Cannot rule out bone marrow disorder at this time.  -Admitted from 10/18/2023 to 10/24/2023 at [facility] for pneumonia.  He was treated with ceftriaxone and azithromycin.  On admission, WBC was 27.7.  With progressive worsening of leukocytosis, hematology was consulted.  On discharge, WBC was 29.6.  ANC 25.8, monocyte 1.9.  Mild left shifted with 1-5 metamyelocytes, occasional myelocytes.  Toxic granulation.  - Prior history of intermittent mild leukocytosis.  Primarily ranging between normal to 17,000.  Differential showed predominant neutrophilia and monocytosis.  Mild anemia with hemoglobin between 10-11.  -BCR/ABL was negative.  Flow cytometry unremarkable.  Repeat CBC with differential today.  If WBC still not trending down, I will reach out to patient's caregiver about repeat CBC and if still elevated, will need bone marrow biopsy discussion.  # Normocytic anemia # CKD stage III -Hemoglobin ranging between 10-11.  Iron panel consistent with anemia of chronic disease. -Vitamin B12 and folate normal. -Will add SPEP/kappa lambda  # Left upper extremity swelling -Offered venous Dopplers.  Patient is scheduled to follow-up primary care next week and  caregiver would like to hold off until then. -Monitor for now.  Orders Placed This Encounter  Procedures   CBC with Differential/Platelet   Vitamin B12   Folate   Multiple Myeloma Panel (SPEP&IFE w/QIG)   Kappa/lambda light chains   RTC as needed for now.  Patient expressed understanding and was in agreement with this plan. He also understands that He can call clinic at any time with any questions, concerns, or complaints.   I spent a total of 45 minutes reviewing chart data, face-to-face evaluation with the patient, counseling and coordination of care as detailed above.  Michaelyn Barter, MD   11/07/2023 10:39 AM

## 2023-11-07 NOTE — Progress Notes (Signed)
I spoke with the patient's caregiver about labs.  His WBC is still elevated at 36,000.  Has completed his antibiotics for pneumonia.  BCR ABL FISH is negative.  Flow cytometry is negative.  I requested for repeat CBC, JAK2 panel CMP.  If WBC still elevated on repeat check, will reach out to schedule a follow-up visit.

## 2023-11-08 LAB — KAPPA/LAMBDA LIGHT CHAINS
Kappa free light chain: 118.2 mg/L — ABNORMAL HIGH (ref 3.3–19.4)
Kappa, lambda light chain ratio: 1.44 (ref 0.26–1.65)
Lambda free light chains: 82.3 mg/L — ABNORMAL HIGH (ref 5.7–26.3)

## 2023-11-10 LAB — MULTIPLE MYELOMA PANEL, SERUM
Albumin SerPl Elph-Mcnc: 2.3 g/dL — ABNORMAL LOW (ref 2.9–4.4)
Albumin/Glob SerPl: 0.8 (ref 0.7–1.7)
Alpha 1: 0.5 g/dL — ABNORMAL HIGH (ref 0.0–0.4)
Alpha2 Glob SerPl Elph-Mcnc: 0.7 g/dL (ref 0.4–1.0)
B-Globulin SerPl Elph-Mcnc: 1 g/dL (ref 0.7–1.3)
Gamma Glob SerPl Elph-Mcnc: 0.8 g/dL (ref 0.4–1.8)
Globulin, Total: 3 g/dL (ref 2.2–3.9)
IgA: 471 mg/dL — ABNORMAL HIGH (ref 61–437)
IgG (Immunoglobin G), Serum: 866 mg/dL (ref 603–1613)
IgM (Immunoglobulin M), Srm: 67 mg/dL (ref 15–143)
Total Protein ELP: 5.3 g/dL — ABNORMAL LOW (ref 6.0–8.5)

## 2023-11-11 ENCOUNTER — Telehealth: Payer: Self-pay

## 2023-11-11 NOTE — Telephone Encounter (Signed)
I called and spoke with patient's caregiver to set up a virtual visit. She told me that the patient is declining so much. His speech has been slurred, he can't walk around without falling so she has put him on bedrest pretty much.

## 2023-11-12 ENCOUNTER — Other Ambulatory Visit: Payer: Self-pay

## 2023-11-12 ENCOUNTER — Inpatient Hospital Stay
Admission: EM | Admit: 2023-11-12 | Discharge: 2023-11-15 | DRG: 689 | Disposition: A | Discharge status: Hospice | Payer: Medicare Other | Attending: Internal Medicine | Admitting: Internal Medicine

## 2023-11-12 ENCOUNTER — Emergency Department: Payer: Medicare Other

## 2023-11-12 DIAGNOSIS — R627 Adult failure to thrive: Secondary | ICD-10-CM | POA: Diagnosis present

## 2023-11-12 DIAGNOSIS — N179 Acute kidney failure, unspecified: Secondary | ICD-10-CM | POA: Diagnosis not present

## 2023-11-12 DIAGNOSIS — I2510 Atherosclerotic heart disease of native coronary artery without angina pectoris: Secondary | ICD-10-CM | POA: Diagnosis present

## 2023-11-12 DIAGNOSIS — R64 Cachexia: Secondary | ICD-10-CM | POA: Diagnosis present

## 2023-11-12 DIAGNOSIS — G4733 Obstructive sleep apnea (adult) (pediatric): Secondary | ICD-10-CM | POA: Diagnosis present

## 2023-11-12 DIAGNOSIS — Z993 Dependence on wheelchair: Secondary | ICD-10-CM

## 2023-11-12 DIAGNOSIS — F0390 Unspecified dementia without behavioral disturbance: Secondary | ICD-10-CM | POA: Diagnosis present

## 2023-11-12 DIAGNOSIS — R4182 Altered mental status, unspecified: Secondary | ICD-10-CM

## 2023-11-12 DIAGNOSIS — Z85038 Personal history of other malignant neoplasm of large intestine: Secondary | ICD-10-CM

## 2023-11-12 DIAGNOSIS — F17210 Nicotine dependence, cigarettes, uncomplicated: Secondary | ICD-10-CM | POA: Diagnosis present

## 2023-11-12 DIAGNOSIS — K219 Gastro-esophageal reflux disease without esophagitis: Secondary | ICD-10-CM | POA: Diagnosis present

## 2023-11-12 DIAGNOSIS — E872 Acidosis, unspecified: Secondary | ICD-10-CM | POA: Diagnosis present

## 2023-11-12 DIAGNOSIS — D72829 Elevated white blood cell count, unspecified: Secondary | ICD-10-CM | POA: Diagnosis present

## 2023-11-12 DIAGNOSIS — E1129 Type 2 diabetes mellitus with other diabetic kidney complication: Secondary | ICD-10-CM | POA: Diagnosis present

## 2023-11-12 DIAGNOSIS — Z96612 Presence of left artificial shoulder joint: Secondary | ICD-10-CM | POA: Diagnosis present

## 2023-11-12 DIAGNOSIS — E860 Dehydration: Secondary | ICD-10-CM | POA: Diagnosis present

## 2023-11-12 DIAGNOSIS — N3000 Acute cystitis without hematuria: Principal | ICD-10-CM

## 2023-11-12 DIAGNOSIS — Z9861 Coronary angioplasty status: Secondary | ICD-10-CM

## 2023-11-12 DIAGNOSIS — E1122 Type 2 diabetes mellitus with diabetic chronic kidney disease: Secondary | ICD-10-CM

## 2023-11-12 DIAGNOSIS — Z681 Body mass index (BMI) 19 or less, adult: Secondary | ICD-10-CM

## 2023-11-12 DIAGNOSIS — F03C0 Unspecified dementia, severe, without behavioral disturbance, psychotic disturbance, mood disturbance, and anxiety: Secondary | ICD-10-CM

## 2023-11-12 DIAGNOSIS — Z7189 Other specified counseling: Secondary | ICD-10-CM

## 2023-11-12 DIAGNOSIS — R5383 Other fatigue: Secondary | ICD-10-CM | POA: Diagnosis present

## 2023-11-12 DIAGNOSIS — G9341 Metabolic encephalopathy: Secondary | ICD-10-CM | POA: Diagnosis present

## 2023-11-12 DIAGNOSIS — Z7951 Long term (current) use of inhaled steroids: Secondary | ICD-10-CM

## 2023-11-12 DIAGNOSIS — M1990 Unspecified osteoarthritis, unspecified site: Secondary | ICD-10-CM | POA: Diagnosis present

## 2023-11-12 DIAGNOSIS — Z8701 Personal history of pneumonia (recurrent): Secondary | ICD-10-CM

## 2023-11-12 DIAGNOSIS — N1832 Chronic kidney disease, stage 3b: Secondary | ICD-10-CM

## 2023-11-12 DIAGNOSIS — J449 Chronic obstructive pulmonary disease, unspecified: Secondary | ICD-10-CM | POA: Diagnosis present

## 2023-11-12 DIAGNOSIS — N400 Enlarged prostate without lower urinary tract symptoms: Secondary | ICD-10-CM | POA: Diagnosis present

## 2023-11-12 DIAGNOSIS — Z66 Do not resuscitate: Secondary | ICD-10-CM | POA: Diagnosis present

## 2023-11-12 DIAGNOSIS — I10 Essential (primary) hypertension: Secondary | ICD-10-CM | POA: Diagnosis present

## 2023-11-12 DIAGNOSIS — H9190 Unspecified hearing loss, unspecified ear: Secondary | ICD-10-CM | POA: Diagnosis present

## 2023-11-12 DIAGNOSIS — Z515 Encounter for palliative care: Secondary | ICD-10-CM

## 2023-11-12 DIAGNOSIS — Z7982 Long term (current) use of aspirin: Secondary | ICD-10-CM

## 2023-11-12 DIAGNOSIS — E7800 Pure hypercholesterolemia, unspecified: Secondary | ICD-10-CM | POA: Diagnosis present

## 2023-11-12 DIAGNOSIS — Z79899 Other long term (current) drug therapy: Secondary | ICD-10-CM

## 2023-11-12 LAB — COMPREHENSIVE METABOLIC PANEL
ALT: 32 U/L (ref 0–44)
AST: 55 U/L — ABNORMAL HIGH (ref 15–41)
Albumin: 2 g/dL — ABNORMAL LOW (ref 3.5–5.0)
Alkaline Phosphatase: 691 U/L — ABNORMAL HIGH (ref 38–126)
Anion gap: 10 (ref 5–15)
BUN: 41 mg/dL — ABNORMAL HIGH (ref 8–23)
CO2: 24 mmol/L (ref 22–32)
Calcium: 8.7 mg/dL — ABNORMAL LOW (ref 8.9–10.3)
Chloride: 108 mmol/L (ref 98–111)
Creatinine, Ser: 1.68 mg/dL — ABNORMAL HIGH (ref 0.61–1.24)
GFR, Estimated: 39 mL/min — ABNORMAL LOW (ref >60)
Glucose, Bld: 117 mg/dL — ABNORMAL HIGH (ref 70–99)
Potassium: 4.5 mmol/L (ref 3.5–5.1)
Sodium: 142 mmol/L (ref 135–145)
Total Bilirubin: 1.4 mg/dL — ABNORMAL HIGH (ref 0.0–1.2)
Total Protein: 4.8 g/dL — ABNORMAL LOW (ref 6.5–8.1)

## 2023-11-12 LAB — CBC WITH DIFFERENTIAL/PLATELET
Abs Immature Granulocytes: 0.75 10*3/uL — ABNORMAL HIGH (ref 0.00–0.07)
Basophils Absolute: 0 10*3/uL (ref 0.0–0.1)
Basophils Relative: 0 %
Eosinophils Absolute: 0 10*3/uL (ref 0.0–0.5)
Eosinophils Relative: 0 %
HCT: 30.3 % — ABNORMAL LOW (ref 39.0–52.0)
Hemoglobin: 9.6 g/dL — ABNORMAL LOW (ref 13.0–17.0)
Immature Granulocytes: 2 %
Lymphocytes Relative: 3 %
Lymphs Abs: 1.4 10*3/uL (ref 0.7–4.0)
MCH: 29.4 pg (ref 26.0–34.0)
MCHC: 31.7 g/dL (ref 30.0–36.0)
MCV: 92.7 fL (ref 80.0–100.0)
Monocytes Absolute: 1.9 10*3/uL — ABNORMAL HIGH (ref 0.1–1.0)
Monocytes Relative: 4 %
Neutro Abs: 43.2 10*3/uL — ABNORMAL HIGH (ref 1.7–7.7)
Neutrophils Relative %: 91 %
Platelets: 305 10*3/uL (ref 150–400)
RBC: 3.27 MIL/uL — ABNORMAL LOW (ref 4.22–5.81)
RDW: 17.1 % — ABNORMAL HIGH (ref 11.5–15.5)
Smear Review: NORMAL
WBC: 47.3 10*3/uL — ABNORMAL HIGH (ref 4.0–10.5)
nRBC: 0.1 % (ref 0.0–0.2)

## 2023-11-12 LAB — LACTIC ACID, PLASMA
Lactic Acid, Venous: 2.6 mmol/L (ref 0.5–1.9)
Lactic Acid, Venous: 1.9 mmol/L (ref 0.5–1.9)

## 2023-11-12 LAB — RESP PANEL BY RT-PCR (RSV, FLU A&B, COVID)  RVPGX2
Influenza A by PCR: NEGATIVE
Influenza B by PCR: NEGATIVE
Resp Syncytial Virus by PCR: NEGATIVE
SARS Coronavirus 2 by RT PCR: NEGATIVE

## 2023-11-12 LAB — URINALYSIS, W/ REFLEX TO CULTURE (INFECTION SUSPECTED)
Bilirubin Urine: NEGATIVE
Glucose, UA: NEGATIVE mg/dL
Hgb urine dipstick: NEGATIVE
Ketones, ur: NEGATIVE mg/dL
Leukocytes,Ua: NEGATIVE
Nitrite: POSITIVE — AB
Protein, ur: 30 mg/dL — AB
Specific Gravity, Urine: 1.019 (ref 1.005–1.030)
Squamous Epithelial / HPF: 0 /[HPF] (ref 0–5)
pH: 5 (ref 5.0–8.0)

## 2023-11-12 MED ORDER — ACETAMINOPHEN 325 MG PO TABS: 650.0000 mg | ORAL_TABLET | Freq: Four times a day (QID) | ORAL | Status: DC | PRN

## 2023-11-12 MED ORDER — FLUTICASONE FUROATE-VILANTEROL 100-25 MCG/ACT IN AEPB
1.0000 | INHALATION_SPRAY | Freq: Every day | RESPIRATORY_TRACT | Status: DC
  Filled 2023-11-12: qty 28

## 2023-11-12 MED ORDER — SODIUM CHLORIDE 0.9 % IV SOLN
1.0000 g | Freq: Once | INTRAVENOUS | Status: AC
  Administered 2023-11-12: 1 g via INTRAVENOUS
  Filled 2023-11-12: qty 10

## 2023-11-12 MED ORDER — GLYCOPYRROLATE 1 MG PO TABS: 1.0000 mg | ORAL_TABLET | ORAL | Status: DC | PRN

## 2023-11-12 MED ORDER — SODIUM CHLORIDE 0.9 % IV BOLUS
1000.0000 mL | Freq: Once | INTRAVENOUS | Status: AC
  Administered 2023-11-12: 1000 mL via INTRAVENOUS

## 2023-11-12 MED ORDER — ONDANSETRON HCL 4 MG PO TABS: 4.0000 mg | ORAL_TABLET | Freq: Four times a day (QID) | ORAL | Status: DC | PRN

## 2023-11-12 MED ORDER — LORAZEPAM 2 MG/ML IJ SOLN
0.5000 mg | INTRAMUSCULAR | Status: DC | PRN
  Administered 2023-11-12 – 2023-11-15 (×6): 0.5 mg via INTRAVENOUS
  Filled 2023-11-12 (×6): qty 1

## 2023-11-12 MED ORDER — SODIUM CHLORIDE 0.9 % IV SOLN
1.0000 g | INTRAVENOUS | Status: AC
  Administered 2023-11-13 – 2023-11-14 (×2): 1 g via INTRAVENOUS
  Filled 2023-11-12: qty 10

## 2023-11-12 MED ORDER — ONDANSETRON HCL 4 MG/2ML IJ SOLN: 4.0000 mg | Freq: Four times a day (QID) | INTRAMUSCULAR | Status: DC | PRN

## 2023-11-12 MED ORDER — ACETAMINOPHEN 650 MG RE SUPP: 650.0000 mg | Freq: Four times a day (QID) | RECTAL | Status: DC | PRN

## 2023-11-12 MED ORDER — MORPHINE SULFATE (PF) 2 MG/ML IV SOLN
1.0000 mg | INTRAVENOUS | Status: DC | PRN
  Administered 2023-11-12: 1 mg via INTRAVENOUS
  Filled 2023-11-12: qty 1

## 2023-11-12 MED ORDER — GLYCOPYRROLATE 0.2 MG/ML IJ SOLN: 0.2000 mg | INTRAMUSCULAR | Status: DC | PRN

## 2023-11-12 MED ORDER — LORAZEPAM 2 MG/ML PO CONC: 0.5000 mg | ORAL | Status: DC | PRN

## 2023-11-12 MED ORDER — MORPHINE SULFATE (PF) 2 MG/ML IV SOLN
1.0000 mg | INTRAVENOUS | Status: DC | PRN
  Administered 2023-11-13 – 2023-11-15 (×5): 2 mg via INTRAVENOUS
  Filled 2023-11-12 (×5): qty 1

## 2023-11-12 MED ORDER — LORAZEPAM 0.5 MG PO TABS: 0.5000 mg | ORAL_TABLET | ORAL | Status: DC | PRN

## 2023-11-12 MED ORDER — UMECLIDINIUM BROMIDE 62.5 MCG/ACT IN AEPB
1.0000 | INHALATION_SPRAY | Freq: Every day | RESPIRATORY_TRACT | Status: DC
  Filled 2023-11-12: qty 7

## 2023-11-12 MED ORDER — ORAL CARE MOUTH RINSE: 15.0000 mL | OROMUCOSAL | Status: DC | PRN

## 2023-11-12 MED ORDER — IPRATROPIUM-ALBUTEROL 0.5-2.5 (3) MG/3ML IN SOLN: 3.0000 mL | RESPIRATORY_TRACT | Status: DC | PRN

## 2023-11-12 NOTE — Assessment & Plan Note (Signed)
Discussed with Angelique Blonder, who states that patient has significantly declined.  She notes that several weeks ago, he was ambulatory, eating well and able to talk coherently although his memory has been impaired for quite a while now.  He is now nonambulatory and not wanting to eat.  She understands that the UTI may be contributing, however the ultimate underlying cause is suspected to be an undetermined malignancy, in light of severe leukocytosis and elevated alkaline phosphatase.  She would like to transition patient to comfort measures only.  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  - TOC consultation for hospice

## 2023-11-12 NOTE — Assessment & Plan Note (Signed)
-   Continue home bronchodilators for comfort - DuoNebs as needed for shortness of breath

## 2023-11-12 NOTE — ED Triage Notes (Signed)
Pt was here earlier this year for dx/o pneumonia--wife is here also for same.  Pt BIB EMS for c/o FTT.  Pt has DNR at bedside, VSS, but pt appears lethargic.  Does answer some questions, pt hard of hearing

## 2023-11-12 NOTE — Assessment & Plan Note (Signed)
History of longstanding dementia noted by PCP.    - Precautions

## 2023-11-12 NOTE — ED Provider Notes (Signed)
[facility] Provider Note    Event Date/Time   First MD Initiated Contact with Patient 11/12/23 1258     (approximate)   History   Failure To Thrive   HPI  George [name] is a 89 y.o. male with history of dementia living at home with his power of attorney and a presents to the ER for evaluation of failure to thrive for the past few days decreased p.o. intake.  Was recently hospitalized for pneumonia.  His spouse is currently in the hospital and treated for pneumonia.  His caregiver is currently at home sick with RSV and unable to care for the patient.  He is unable to provide much additional history.     Physical Exam   Triage Vital Signs: ED Triage Vitals  Encounter Vitals Group     BP 11/12/23 1245 (!) 111/55     Systolic BP Percentile --      Diastolic BP Percentile --      Pulse Rate 11/12/23 1245 62     Resp 11/12/23 1245 20     Temp 11/12/23 1245 (!) 97 F (36.1 C)     Temp Source 11/12/23 1245 Axillary     SpO2 11/12/23 1245 99 %     Weight --      Height --      Head Circumference --      Peak Flow --      Pain Score 11/12/23 1252 0     Pain Loc --      Pain Education --      Exclude from Growth Chart --     Most recent vital signs: Vitals:   11/12/23 1245  BP: (!) 111/55  Pulse: 62  Resp: 20  Temp: (!) 97 F (36.1 C)  SpO2: 99%     Constitutional: Alert, ill appearing Eyes: Conjunctivae are normal.  Head: Atraumatic. Nose: No congestion/rhinnorhea. Mouth/Throat: Mucous membranes are dry   Neck: Painless ROM.  Cardiovascular:   Good peripheral circulation. Respiratory: Normal respiratory effort.  No retractions.  Gastrointestinal: Soft and nontender.  Musculoskeletal:  no deformity Neurologic:  MAE spontaneously. No gross focal neurologic deficits are appreciated.  Skin:  Skin is warm, dry and intact. No rash noted.     ED Results / Procedures / Treatments   Labs (all labs ordered are listed, but only  abnormal results are displayed) Labs Reviewed  LACTIC ACID, PLASMA - Abnormal; Notable for the following components:      Result Value   Lactic Acid, Venous 2.6 (*)    All other components within normal limits  COMPREHENSIVE METABOLIC PANEL - Abnormal; Notable for the following components:   Glucose, Bld 117 (*)    BUN 41 (*)    Creatinine, Ser 1.68 (*)    Calcium 8.7 (*)    Total Protein 4.8 (*)    Albumin 2.0 (*)    AST 55 (*)    Alkaline Phosphatase 691 (*)    Total Bilirubin 1.4 (*)    GFR, Estimated 39 (*)    All other components within normal limits  CBC WITH DIFFERENTIAL/PLATELET - Abnormal; Notable for the following components:   WBC 47.3 (*)    RBC 3.27 (*)    Hemoglobin 9.6 (*)    HCT 30.3 (*)    RDW 17.1 (*)    Neutro Abs 43.2 (*)    Monocytes Absolute 1.9 (*)    Abs Immature Granulocytes 0.75 (*)    All other  components within normal limits  URINALYSIS, W/ REFLEX TO CULTURE (INFECTION SUSPECTED) - Abnormal; Notable for the following components:   Color, Urine YELLOW (*)    APPearance CLEAR (*)    Protein, ur 30 (*)    Nitrite POSITIVE (*)    Bacteria, UA MANY (*)    All other components within normal limits  RESP PANEL BY RT-PCR (RSV, FLU A&B, COVID)  [id number]  CULTURE, BLOOD (ROUTINE X 2)  CULTURE, BLOOD (ROUTINE X 2)  LACTIC ACID, PLASMA     EKG  ED ECG REPORT I, Willy Eddy, the attending physician, personally viewed and interpreted this ECG.   Date: 11/12/2023  EKG Time: 12:47  Rate: 60  Rhythm: sinus  Axis: left  Intervals: normal  ST&T Change: no stemi, no depressions    RADIOLOGY Please see ED Course for my review and interpretation.  I personally reviewed all radiographic images ordered to evaluate for the above acute complaints and reviewed radiology reports and findings.  These findings were personally discussed with the patient.  Please see medical record for radiology report.    PROCEDURES:  Critical Care performed:  No  Procedures   MEDICATIONS ORDERED IN ED: Medications  cefTRIAXone (ROCEPHIN) 1 g in sodium chloride 0.9 % 100 mL IVPB (has no administration in time range)     IMPRESSION / MDM / ASSESSMENT AND PLAN / ED COURSE  I reviewed the triage vital signs and the nursing notes.                              Differential diagnosis includes, but is not limited to, Dehydration, sepsis, pna, uti, hypoglycemia, cva, drug effect, withdrawal, encephalitis  Patient presenting to the ER for evaluation of symptoms as described above.  Based on symptoms, risk factors and considered above differential, this presenting complaint could reflect a potentially life-threatening illness therefore the patient will be placed on continuous pulse oximetry and telemetry for monitoring.  Laboratory evaluation will be sent to evaluate for the above complaints.      Clinical Course as of 11/12/23 1437  Sat Nov 12, 2023  1321 Chest x-ray with some patchy infiltrates on my review interpretation.  No effusion. [PR]  1329 Discussed the patient's presentation with the patient's POA listed in the chart.  She states that the patient lives with her 54 7 she is currently sick with RSV.  Has noted significant decline in status over the past week.  He is no longer eating or drinking.  She had plans to speak with the patient's primary care physician this week regarding initiating hospice care and comfort measures. [PR]  1423 UA consistent with UTI.  Have ordered Rocephin.  Will consult hospitalist for admission. [PR]    Clinical Course User Index [PR] Willy Eddy, MD     FINAL CLINICAL IMPRESSION(S) / ED DIAGNOSES   Final diagnoses:  Acute cystitis without hematuria  Altered mental status, unspecified altered mental status type     Rx / DC Orders   ED Discharge Orders     None        Note:  This document was prepared using Dragon voice recognition software and may include unintentional dictation  errors.    Willy Eddy, MD 11/12/23 [phone]

## 2023-11-12 NOTE — ED Notes (Signed)
Pt appears to be resting comfortably now after pain medication was given.

## 2023-11-12 NOTE — H&P (Signed)
History and Physical    Patient: George [name] EAV:[id number] DOB: [birth year] DOA: 11/12/2023 DOS: the patient was seen and examined on 11/12/2023 PCP: [name], MD  Patient coming from: Home  Chief Complaint:  Chief Complaint  Patient presents with   Failure To Thrive   HPI: George [name] is a 89 y.o. male with medical history significant of CAD s/p PCI and DES to distal LAD (1999), severe COPD, type 2 diabetes, OSA, hypertension, hyperlipidemia, who presents to the ED due to failure to thrive.  History obtained through chart review and from patient's caretaker/HCPOA Tonita Phoenix.  Boneta Lucks states that since George [name] was admitted for pneumonia at the beginning of the month, he has never recovered.  During this time, he is now completely wheelchair-bound and when he tries to walk, he has fallen numerous times.  Over the last few days, his p.o. intake has decreased substantially and he only takes a few bites with every meal.  His speech has worsened and become more incoherent.  Due to this, he was brought to the ED.  ED course:  On arrival to the ED, patient was noted to tensive at 107/51 with heart rate of 58.  He was saturating at 97% on room air.  He was afebrile at 97. Initial workup notable for WBC of 47.3, hemoglobin of 9.6, BUN 41, creatinine 1.68, alkaline phosphatase 691, AST 55, and GFR of 39.  Lactic acid 2.6.  Urinalysis with positive nitrites, and many bacteria.  Chest x-ray with irregular nodule density in the LUL and irregular density in the RUL.  Patient was started on IV fluids and Rocephin.  TRH contacted for admission.  Review of Systems: unable to review all systems due to the inability of the patient to answer questions.  Past Medical History:  Diagnosis Date   Anemia    Arthritis    BPH (benign prostatic hyperplasia)    Cancer (HCC)    COLON   COPD (chronic obstructive pulmonary disease) (HCC)    Coronary artery disease    WITH 1 STENT   Dyspnea     WITH EXERTION ONLY   GERD (gastroesophageal reflux disease)    HOH (hard of hearing)    Hypercholesteremia    Hypertension    Pneumonia 06/2021   Sleep apnea    HAD UPPP   Past Surgical History:  Procedure Laterality Date   CARDIAC CATHETERIZATION     has a stent   COLON SURGERY     DUE TO COLON CANCER   COLONOSCOPY     MULTIPLE   EYE SURGERY Bilateral    CATARACT   HERNIA REPAIR     HOLEP-LASER ENUCLEATION OF THE PROSTATE WITH MORCELLATION N/A 08/21/2021   Procedure: HOLEP-LASER ENUCLEATION OF THE PROSTATE WITH MORCELLATION;  Surgeon: Sondra Come, MD;  Location: [facility] ORS;  Service: Urology;  Laterality: N/A;   REVERSE SHOULDER ARTHROPLASTY Left 02/10/2021   Procedure: REVERSE SHOULDER ARTHROPLASTY;  Surgeon: Christena Flake, MD;  Location: [facility] ORS;  Service: Orthopedics;  Laterality: Left;   TONSILLECTOMY     UVULOPALATOPHARYNGOPLASTY     Social History:  reports that he has been smoking cigarettes. He has a 90 pack-year smoking history. He has never used smokeless tobacco. He reports that he does not currently use alcohol. He reports that he does not use drugs.  No Known Allergies  Family History  Problem Relation Age of Onset   Prostate cancer Neg Hx    Bladder Cancer Neg Hx  Kidney cancer Neg Hx     Prior to Admission medications   Medication Sig Start Date End Date Taking? Authorizing Provider  albuterol (PROVENTIL) (2.5 MG/3ML) 0.083% nebulizer solution Take 2.5 mg by nebulization every 6 (six) hours as needed for wheezing. 01/20/23 01/20/24  [provider]  albuterol (VENTOLIN HFA) 108 (90 Base) MCG/ACT inhaler Inhale 1-2 puffs into the lungs every 6 (six) hours as needed for wheezing or shortness of breath. Patient not taking: Reported on 10/18/2023    [provider]  aspirin EC 81 MG tablet Take 81 mg by mouth daily. Swallow whole.    [provider]  Fluticasone-Umeclidin-Vilant (TRELEGY ELLIPTA) 100-62.5-25 MCG/INH AEPB Inhale 1 puff  into the lungs every morning.    [provider]  omeprazole (PRILOSEC) 20 MG capsule Take 20 mg by mouth every morning.    [provider]  simvastatin (ZOCOR) 20 MG tablet Take 10 mg by mouth at bedtime.    [provider]  verapamil (CALAN-SR) 240 MG CR tablet Take 240 mg by mouth 2 (two) times daily.    [provider]    Physical Exam: Vitals:   11/12/23 1330 11/12/23 1400 11/12/23 1430 11/12/23 1500  BP: (!) 121/56 115/61 123/63 (!) 120/57  Pulse: 62 61 71 64  Resp: 17 19    Temp:      TempSrc:      SpO2: 96% 99% 98% 95%   Physical Exam Vitals and nursing note reviewed.  Constitutional:      Appearance: He is cachectic.     Comments: Lethargic but awakes easily to touch.  Very hard of hearing  HENT:     Head: Normocephalic and atraumatic.     Mouth/Throat:     Comments: Extremely dry oropharynx with dried mucus in the posterior aspect.  Otherwise clear. Eyes:     Conjunctiva/sclera: Conjunctivae normal.     Pupils: Pupils are equal, round, and reactive to light.  Cardiovascular:     Rate and Rhythm: Normal rate and regular rhythm.     Heart sounds: No murmur heard. Pulmonary:     Effort: Pulmonary effort is normal. No respiratory distress.     Breath sounds: Normal breath sounds.  Abdominal:     General: There is no distension.     Palpations: Abdomen is soft.     Tenderness: There is no abdominal tenderness. There is no guarding.  Musculoskeletal:     Right lower leg: No edema.     Left lower leg: No edema.  Skin:    General: Skin is warm and dry.  Neurological:     Comments:  Lethargic but awakens easily.  Disoriented.    Data Reviewed: CBC with WBC 47.3, hemoglobin of 9.6, and platelets of 305 CMP with sodium of 142, potassium 4.5, bicarb 24, glucose 117, BUN 41, creatinine 0.68, alkaline phosphatase 691, albumin 2.0, AST 55, GFR of 39 Lactic acid 2.6 Urinalysis with positive nitrites, proteinuria many bacteria  EKG  personally reviewed.  Sinus rhythm with rate of 62.  Borderline first-degree AV block.  Right bundle branch block.  DG Chest Port 1 View Result Date: 11/12/2023 CLINICAL DATA:  Sepsis. EXAM: PORTABLE CHEST 1 VIEW COMPARISON:  October 22, 2023.  May 25, 2023. FINDINGS: The heart size and mediastinal contours are within normal limits. Status post left shoulder arthroplasty. Irregular nodular density seen in left upper lobe as described on prior CT scan. Interval development of irregular density in right upper lobe which may represent  focal inflammation. Minimal right basilar subsegmental atelectasis or scarring is noted. IMPRESSION: Irregular nodular density is seen in left upper lobe which was noted on prior CT scan. Interval development of irregular density in right upper lobe which may represent focal inflammation. Followup PA and lateral chest X-ray is recommended in 3-4 weeks following trial of antibiotic therapy to ensure resolution and exclude underlying malignancy. Electronically Signed   By: Lupita Raider M.D.   On: 11/12/2023 13:29   There are no new results to review at this time.  Assessment and Plan:  * Adult failure to thrive Per George [name], patient has rapidly declined since admitted with pneumonia several weeks ago and quality of life has substantially diminished.  He is now no longer ambulatory and has poor p.o. intake.  She would like to trial a short course of IV fluids and antibiotics for possible cystitis, with ultimate plan of transitioning to hospice at home with her.  - [facility] consultation for hospice - Comfort measures  Goals of care, counseling/discussion Discussed with George [name], who states that patient has significantly declined.  She notes that several weeks ago, he was ambulatory, eating well and able to talk coherently although his memory has been impaired for quite a while now.  He is now nonambulatory and not wanting to eat.  She understands that the UTI may be contributing,  however the ultimate underlying cause is suspected to be an undetermined malignancy, in light of severe leukocytosis and elevated alkaline phosphatase.  She would like to transition patient to comfort measures only.  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  - [facility] consultation for hospice  Acute cystitis UA with positive nitrites and many bacteria.  Patient is unable to vocalize any symptoms.  - Trial of Rocephin x 3 days  AKI (acute kidney injury) (HCC) In the setting of dehydration and poor p.o. intake.  - Trial of IV fluids per HCPOA wishes. Expressed low long-term utility if patient does not resume PO intake independently  Leukocytosis Severe leukocytosis, developed sometime in December 2024 with previous labs before.  He has seen hematology workup has been initiated.  Etiology thus far is uncertain as multiple myeloma panel, flow cytometry, and BCR-ABL1 FISH have been negative. JAK testing pending still.   -No further monitoring of leukocytosis at this time given patient is transitioning to comfort and hospice  Dementia [facility]) History of longstanding dementia noted by PCP.    - Precautions  Type II diabetes mellitus with renal manifestations (HCC) - No indication for SSI at this time  COPD (chronic obstructive pulmonary disease) (HCC) - Continue home bronchodilators for comfort - DuoNebs as needed for shortness of breath  CAD (coronary artery disease) - Discontinue home aspirin and statin  Essential hypertension - Discontinue home antihypertensives  Advance Care Planning:   Code Status: Do not attempt resuscitation (DNR) - Comfort care please see goals of care listed above  Consults: None  Family Communication: Patient's aide/HCPOA updated via telephone  Severity of Illness: The appropriate patient status for this patient is OBSERVATION. Observation status is judged to be reasonable and necessary in order to provide  the required intensity of service to ensure the patient's safety. The patient's presenting symptoms, physical exam findings, and initial radiographic and laboratory data in the context of their medical condition is felt to place them at decreased risk for further clinical deterioration. Furthermore, it is anticipated that the patient will be medically  stable for discharge from the hospital within 2 midnights of admission.   Author: Verdene Lennert, MD 11/12/2023 3:33 PM  For on call review [email or web address].

## 2023-11-12 NOTE — ED Notes (Signed)
Pt appears to be resting comfortably at this time.

## 2023-11-12 NOTE — Assessment & Plan Note (Signed)
-   No indication for SSI at this time 

## 2023-11-12 NOTE — Assessment & Plan Note (Signed)
-   Discontinue home aspirin and statin

## 2023-11-12 NOTE — Assessment & Plan Note (Addendum)
In the setting of dehydration and poor p.o. intake.  - Trial of IV fluids per HCPOA wishes. Expressed low long-term utility if patient does not resume PO intake independently

## 2023-11-12 NOTE — ED Notes (Signed)
Pt  keeps saying, please help me and please don't leave me like this--I hurt all over.  Dr. Huel Cote was messaged regarding pain medication.  He was provided with oral care and his brief was changed.

## 2023-11-12 NOTE — ED Notes (Signed)
Pt was cleaned up upon arrival, new brief placed after he was in/out cath'd--he's currently on room air.  He is very somnolent but does awaken with stimuli. Labs were collected upon arrival, however lab needed a recollect on some tubes--this RN was not notified but was able to see labs needed a repeat.

## 2023-11-12 NOTE — ED Notes (Signed)
Pt was noted to be curled up in bed, laying against the side rails.  Seizure pads placed for comfort, pt given warm blankets. MD in to see pt now

## 2023-11-12 NOTE — Assessment & Plan Note (Signed)
Severe leukocytosis, developed sometime in December 2024 with previous labs before.  He has seen hematology workup has been initiated.  Etiology thus far is uncertain as multiple myeloma panel, flow cytometry, and BCR-ABL1 FISH have been negative. JAK testing pending still.   -No further monitoring of leukocytosis at this time given patient is transitioning to comfort and hospice

## 2023-11-12 NOTE — Assessment & Plan Note (Signed)
UA with positive nitrites and many bacteria.  Patient is unable to vocalize any symptoms.  - Trial of Rocephin x 3 days

## 2023-11-12 NOTE — ED Notes (Signed)
Pt appears more comfortable at this time with ativan

## 2023-11-12 NOTE — Assessment & Plan Note (Signed)
-   Discontinue home antihypertensives

## 2023-11-12 NOTE — ED Notes (Signed)
Pt was yelling out for help, stating that he can't move.  Pt was repositioned with pillows appears more comfortable now

## 2023-11-12 NOTE — ED Notes (Signed)
Lab work sent to the lab ([facility], [place]) with label cut off. Label is missing part of patient name, birthday, and MRN. Contacted RN to inform her of need for recollect.

## 2023-11-12 NOTE — ED Notes (Signed)
Pt cont to move sideways in bed--repositioned the pt, placed pillows on pt and covered pt

## 2023-11-12 NOTE — Assessment & Plan Note (Signed)
Per Angelique Blonder, patient has rapidly declined since admitted with pneumonia several weeks ago and quality of life has substantially diminished.  He is now no longer ambulatory and has poor p.o. intake.  She would like to trial a short course of IV fluids and antibiotics for possible cystitis, with ultimate plan of transitioning to hospice at home with her.  - [facility] consultation for hospice - Comfort measures

## 2023-11-13 DIAGNOSIS — F03C0 Unspecified dementia, severe, without behavioral disturbance, psychotic disturbance, mood disturbance, and anxiety: Secondary | ICD-10-CM | POA: Diagnosis not present

## 2023-11-13 DIAGNOSIS — Z7189 Other specified counseling: Secondary | ICD-10-CM | POA: Diagnosis not present

## 2023-11-13 DIAGNOSIS — N400 Enlarged prostate without lower urinary tract symptoms: Secondary | ICD-10-CM | POA: Diagnosis present

## 2023-11-13 DIAGNOSIS — Z85038 Personal history of other malignant neoplasm of large intestine: Secondary | ICD-10-CM | POA: Diagnosis not present

## 2023-11-13 DIAGNOSIS — Z681 Body mass index (BMI) 19 or less, adult: Secondary | ICD-10-CM | POA: Diagnosis not present

## 2023-11-13 DIAGNOSIS — Z66 Do not resuscitate: Secondary | ICD-10-CM | POA: Diagnosis present

## 2023-11-13 DIAGNOSIS — F17210 Nicotine dependence, cigarettes, uncomplicated: Secondary | ICD-10-CM | POA: Diagnosis present

## 2023-11-13 DIAGNOSIS — Z515 Encounter for palliative care: Secondary | ICD-10-CM | POA: Diagnosis not present

## 2023-11-13 DIAGNOSIS — I2510 Atherosclerotic heart disease of native coronary artery without angina pectoris: Secondary | ICD-10-CM | POA: Diagnosis present

## 2023-11-13 DIAGNOSIS — F0390 Unspecified dementia without behavioral disturbance: Secondary | ICD-10-CM | POA: Diagnosis present

## 2023-11-13 DIAGNOSIS — N3000 Acute cystitis without hematuria: Secondary | ICD-10-CM | POA: Diagnosis present

## 2023-11-13 DIAGNOSIS — G4733 Obstructive sleep apnea (adult) (pediatric): Secondary | ICD-10-CM | POA: Diagnosis present

## 2023-11-13 DIAGNOSIS — R4182 Altered mental status, unspecified: Secondary | ICD-10-CM

## 2023-11-13 DIAGNOSIS — E860 Dehydration: Secondary | ICD-10-CM | POA: Diagnosis present

## 2023-11-13 DIAGNOSIS — N179 Acute kidney failure, unspecified: Secondary | ICD-10-CM | POA: Diagnosis present

## 2023-11-13 DIAGNOSIS — Z96612 Presence of left artificial shoulder joint: Secondary | ICD-10-CM | POA: Diagnosis present

## 2023-11-13 DIAGNOSIS — I10 Essential (primary) hypertension: Secondary | ICD-10-CM | POA: Diagnosis present

## 2023-11-13 DIAGNOSIS — E1129 Type 2 diabetes mellitus with other diabetic kidney complication: Secondary | ICD-10-CM | POA: Diagnosis present

## 2023-11-13 DIAGNOSIS — Z993 Dependence on wheelchair: Secondary | ICD-10-CM | POA: Diagnosis not present

## 2023-11-13 DIAGNOSIS — J449 Chronic obstructive pulmonary disease, unspecified: Secondary | ICD-10-CM | POA: Diagnosis present

## 2023-11-13 DIAGNOSIS — E7800 Pure hypercholesterolemia, unspecified: Secondary | ICD-10-CM | POA: Diagnosis present

## 2023-11-13 DIAGNOSIS — E872 Acidosis, unspecified: Secondary | ICD-10-CM | POA: Diagnosis present

## 2023-11-13 DIAGNOSIS — Z9861 Coronary angioplasty status: Secondary | ICD-10-CM | POA: Diagnosis not present

## 2023-11-13 DIAGNOSIS — Z8701 Personal history of pneumonia (recurrent): Secondary | ICD-10-CM | POA: Diagnosis not present

## 2023-11-13 DIAGNOSIS — R64 Cachexia: Secondary | ICD-10-CM | POA: Diagnosis present

## 2023-11-13 DIAGNOSIS — R627 Adult failure to thrive: Secondary | ICD-10-CM | POA: Diagnosis present

## 2023-11-13 DIAGNOSIS — G9341 Metabolic encephalopathy: Secondary | ICD-10-CM | POA: Diagnosis present

## 2023-11-13 NOTE — Hospital Course (Addendum)
Taken from H&P.  George [name] is a 89 y.o. male with medical history significant of CAD s/p PCI and DES to distal LAD (1999), severe COPD, type 2 diabetes, OSA, hypertension, hyperlipidemia, who presents to the ED due to failure to thrive.  Per patient's caretaker/HCPOA Tonita Phoenix. Boneta Lucks states that since [name] was admitted for pneumonia at the beginning of the month, he has never recovered. During this time, he is now completely wheelchair-bound and when he tries to walk, he has fallen numerous times. Over the last few days, his p.o. intake has decreased substantially and he only takes a few bites with every meal. His speech has worsened and become more incoherent.   On presentation patient has borderline soft blood pressure, afebrile Initial workup notable for WBC of 47.3, hemoglobin of 9.6, BUN 41, creatinine 1.68, alkaline phosphatase 691, AST 55, and GFR of 39. Lactic acid 2.6. Urinalysis with positive nitrites, and many bacteria. Chest x-ray with irregular nodule density in the LUL and irregular density in the RUL. Patient was started on IV fluids and Rocephin.   2/2: His HPOA would like to try a short course of antibiotics for concern of UTI.  Remained encephalopathic and does not appear safe to take p.o.  Swallow evaluation was also ordered.. Concern of underlying undetermined malignancy, she would like to take him back home with hospice care. Hospice care was consulted.  2/3: Remained encephalopathic and unable to take any p.o.  Approved for hospice, POA would like to take him back home tomorrow with hospice help after delivery of equipments.  2/4: Remained encephalopathic, received a dose of Ativan and morphine earlier in the morning as he was seen being very uncomfortable.  Patient is unable to take anything by mouth.  POA already transitioned him to full comfort care.  Very poor prognosis at this time.  Patient is being discharged home with hospice help for end-of-life  care.

## 2023-11-13 NOTE — ED Notes (Signed)
Pt repositioned for comfort. Brief clean and dry.

## 2023-11-13 NOTE — ED Notes (Signed)
 Pt sleeping w/ even and unlabored respirations, VSS.

## 2023-11-13 NOTE — Progress Notes (Signed)
Progress Note   Patient: George [name] XBJ:[id number] DOB: 1932-03-30 DOA: 11/12/2023     0 DOS: the patient was seen and examined on 11/13/2023   Brief hospital course: Taken from H&P.  George [name] is a 89 y.o. male with medical history significant of CAD s/p PCI and DES to distal LAD (1999), severe COPD, type 2 diabetes, OSA, hypertension, hyperlipidemia, who presents to the ED due to failure to thrive.  Per patient's caretaker/HCPOA George [name]. George [name] states that since George [name] was admitted for pneumonia at the beginning of the month, he has never recovered. During this time, he is now completely wheelchair-bound and when he tries to walk, he has fallen numerous times. Over the last few days, his p.o. intake has decreased substantially and he only takes a few bites with every meal. His speech has worsened and become more incoherent.   On presentation patient has borderline soft blood pressure, afebrile Initial workup notable for WBC of 47.3, hemoglobin of 9.6, BUN 41, creatinine 1.68, alkaline phosphatase 691, AST 55, and GFR of 39. Lactic acid 2.6. Urinalysis with positive nitrites, and many bacteria. Chest x-ray with irregular nodule density in the LUL and irregular density in the RUL. Patient was started on IV fluids and Rocephin.   2/2: His HPOA would like to try a short course of antibiotics for concern of UTI.  Remained encephalopathic and does not appear safe to take p.o.  Swallow evaluation was also ordered.. Concern of underlying undetermined malignancy, she would like to take him back home with hospice care. Hospice care was consulted.      Assessment and Plan: * Adult failure to thrive Per George [name], patient has rapidly declined since admitted with pneumonia several weeks ago and quality of life has substantially diminished.  He is now no longer ambulatory and has poor p.o. intake.  She would like to trial a short course of IV fluids and antibiotics for possible cystitis,  with ultimate plan of transitioning to hospice at home with her.  - [facility] consultation for hospice - Comfort measures  Goals of care, counseling/discussion Discussed with George [name], who states that patient has significantly declined.  She notes that several weeks ago, he was ambulatory, eating well and able to talk coherently although his memory has been impaired for quite a while now.  He is now nonambulatory and not wanting to eat.  She understands that the UTI may be contributing, however the ultimate underlying cause is suspected to be an undetermined malignancy, in light of severe leukocytosis and elevated alkaline phosphatase.  She would like to transition patient to comfort measures only.  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  - [facility] consultation for hospice  Acute cystitis UA with positive nitrites and many bacteria.  Patient is unable to vocalize any symptoms.  - Trial of Rocephin x 3 days  AKI (acute kidney injury) (HCC) In the setting of dehydration and poor p.o. intake.  - Trial of IV fluids per HCPOA wishes. Expressed low long-term utility if patient does not resume PO intake independently  Leukocytosis Severe leukocytosis, developed sometime in December 2024 with previous labs before.  He has seen hematology workup has been initiated.  Etiology thus far is uncertain as multiple myeloma panel, flow cytometry, and BCR-ABL1 FISH have been negative. JAK testing pending still.   -No further monitoring of leukocytosis at this time given patient is transitioning to comfort and hospice  Dementia (HCC) History of longstanding dementia noted by PCP.    - Precautions  Type II diabetes mellitus with renal manifestations (HCC) - No indication for SSI at this time  COPD (chronic obstructive pulmonary disease) (HCC) - Continue home bronchodilators for comfort - DuoNebs as needed for shortness of breath  CAD (coronary artery  disease) - Discontinue home aspirin and statin  Essential hypertension - Discontinue home antihypertensives   Subjective: Patient was just moaning and unable to communicate when seen today.  Not following any commands.  Physical Exam: Vitals:   11/13/23 0830 11/13/23 0900 11/13/23 0902 11/13/23 1036  BP: 124/62 132/70  136/73  Pulse: 87 87  97  Resp: 17 18  17   Temp:   98.4 F (36.9 C)   TempSrc:      SpO2: 99% 100%  97%   General.  Frail and severely malnourished elderly man, in no acute distress. Pulmonary.  Lungs clear bilaterally, normal respiratory effort. CV.  Regular rate and rhythm, no JVD, rub or murmur. Abdomen.  Soft, nontender, nondistended, BS positive. CNS.  Lethargic, not following any commands Extremities.  No edema, no cyanosis, pulses intact and symmetrical.  Data Reviewed: Prior data reviewed  Family Communication: Talked with POA on phone.  Disposition: Status is: Observation The patient will require care spanning > 2 midnights and should be moved to inpatient because: Severity of illness  Planned Discharge Destination:  Likely be home with hospice versus inpatient hospice  Time spent: 45 minutes  This record has been created using Conservation officer, historic buildings. Errors have been sought and corrected,but may not always be located. Such creation errors do not reflect on the standard of care.   Author: Arnetha Courser, MD 11/13/2023 2:04 PM  For on call review [email or web address].

## 2023-11-13 NOTE — Progress Notes (Signed)
SLP Cancellation Note  Patient Details Name: George [name] MRN: [id number] DOB: Aug 02, 1932   Cancelled treatment:       Reason Eval/Treat Not Completed: Patient's level of consciousness  Pt continues to be encephalopathic and is inappropriate for PO trials. Chart indicated comfort measure but MD provides that pt's POA would continue to like evaluation to assess for safest diet.   Asaiah Hunnicutt 11/13/2023, 2:26 PM

## 2023-11-13 NOTE — ED Notes (Signed)
Pt had soiled brief. Pt cleaned up and clean brief placed. Pt linens changed and pulled up in bed. Pt given warm blanket and transport to floor at this time by EDT alexia. Pt just now waking up and breakfast tray sent with pt

## 2023-11-14 DIAGNOSIS — R627 Adult failure to thrive: Secondary | ICD-10-CM | POA: Diagnosis not present

## 2023-11-14 DIAGNOSIS — Z7189 Other specified counseling: Secondary | ICD-10-CM | POA: Diagnosis not present

## 2023-11-14 DIAGNOSIS — R4182 Altered mental status, unspecified: Secondary | ICD-10-CM | POA: Diagnosis not present

## 2023-11-14 DIAGNOSIS — N3000 Acute cystitis without hematuria: Secondary | ICD-10-CM | POA: Diagnosis not present

## 2023-11-14 NOTE — Progress Notes (Signed)
 Nutrition Brief Note  Chart reviewed. Pt now transitioning to comfort care.  No further nutrition interventions planned at this time.  Please re-consult as needed.   Levada Schilling, RD, LDN, CDCES Registered Dietitian III Certified Diabetes Care and Education Specialist If unable to reach this RD, please use "RD Inpatient" group chat on secure chat between hours of 8am-4 pm daily

## 2023-11-14 NOTE — Assessment & Plan Note (Signed)
UA with positive nitrites and many bacteria.  Patient is unable to vocalize any symptoms.  - Trial of Rocephin x 3 days-will complete the course today

## 2023-11-14 NOTE — Evaluation (Signed)
Clinical/Bedside Swallow Evaluation Patient Details  Name: George [name] MRN: [id number] Date of Birth: 1932/08/22  Today's Date: 11/14/2023 Time: SLP Start Time (ACUTE ONLY): 1200 SLP Stop Time (ACUTE ONLY): 1235 SLP Time Calculation (min) (ACUTE ONLY): 35 min  Past Medical History:  Past Medical History:  Diagnosis Date   Anemia    Arthritis    BPH (benign prostatic hyperplasia)    Cancer (HCC)    COLON   COPD (chronic obstructive pulmonary disease) (HCC)    Coronary artery disease    WITH 1 STENT   Dyspnea    WITH EXERTION ONLY   GERD (gastroesophageal reflux disease)    HOH (hard of hearing)    Hypercholesteremia    Hypertension    Pneumonia 06/2021   Sleep apnea    HAD UPPP   Past Surgical History:  Past Surgical History:  Procedure Laterality Date   CARDIAC CATHETERIZATION     has a stent   COLON SURGERY     DUE TO COLON CANCER   COLONOSCOPY     MULTIPLE   EYE SURGERY Bilateral    CATARACT   HERNIA REPAIR     HOLEP-LASER ENUCLEATION OF THE PROSTATE WITH MORCELLATION N/A 08/21/2021   Procedure: HOLEP-LASER ENUCLEATION OF THE PROSTATE WITH MORCELLATION;  Surgeon: Sondra Come, MD;  Location: [facility] ORS;  Service: Urology;  Laterality: N/A;   REVERSE SHOULDER ARTHROPLASTY Left 02/10/2021   Procedure: REVERSE SHOULDER ARTHROPLASTY;  Surgeon: Christena Flake, MD;  Location: [facility] ORS;  Service: Orthopedics;  Laterality: Left;   TONSILLECTOMY     UVULOPALATOPHARYNGOPLASTY     HPI:  George [name] is a 89 y.o. male with medical history significant of CAD s/p PCI and DES to distal LAD (1999), severe COPD, type 2 diabetes, OSA, hypertension, hyperlipidemia, who presents to the ED due to failure to thrive. Per patient's caretaker/HCPOA George [name]. Boneta Lucks states that since George [name] was admitted for pneumonia at the beginning of the month, he has never recovered. During this time, he is now completely wheelchair-bound and when he tries to walk, he has fallen numerous  times. Over the last few days, his p.o. intake has decreased substantially and he only takes a few bites with every meal. His speech has worsened and become more incoherent. 11/12/23: "Irregular nodular density is seen in left upper lobe which was noted  on prior CT scan. Interval development of irregular density in right upper lobe which may represent focal inflammation. Follow up PA and lateral chest X-ray is recommended in 3-4 weeks following trial of antibiotic therapy to ensure resolution and exclude underlying  malignancy." Pt transitioned to comfort care, with plan to transition home with hospice. While in house on a puree solids and thin liquids diet.    Assessment / Plan / Recommendation  Clinical Impression  Pt seen for bedside swallow assessment in the setting of failure to thrive. Swallow evaluation completed per request of pt's POA. Upon therapist approach, pt resting with open mouth posture. Eyes closed, moaning, with no perceptual response to therapist approach. Pt passively agreeable to completion of oral care- with pt noted to have dry oral cavity. Single ice chip trials completed. Reflexive swallow noted following passive movement of bolus, though no oral manipulation/lingual propulsion observed. Based on lack of response/engagement to trials, no further PO trials completed. Defer to medical team for selection of diet. If pt alert for PO intake- recommend strict aspiration precautions (slow rate, small bites, elevated HOB, and alert for PO intake).  SLP called Angelique Blonder for education regarding risk for aspiration and aspiration precautions. Angelique Blonder endorsed understanding across education. No further acute SLP services indicated at this time.  SLP Visit Diagnosis: Dysphagia, unspecified (R13.10)    Aspiration Risk  Severe aspiration risk    Diet Recommendation    (defer to medical team)  Medication Administration:  (defer to medical team)    Other  Recommendations Oral Care Recommendations:  Oral care QID    Recommendations for follow up therapy are one component of a multi-disciplinary discharge planning process, led by the attending physician.  Recommendations may be updated based on patient status, additional functional criteria and insurance authorization.  Follow up Recommendations No SLP follow up      Assistance Recommended at Discharge    Functional Status Assessment Patient has had a recent decline in their functional status and/or demonstrates limited ability to make significant improvements in function in a reasonable and predictable amount of time (pt pursuing comfort measures)         Prognosis Prognosis for improved oropharyngeal function: Guarded Barriers to Reach Goals: Severity of deficits      Swallow Study   General Date of Onset: 11/14/23 HPI: George [name] is a 89 y.o. male with medical history significant of CAD s/p PCI and DES to distal LAD (1999), severe COPD, type 2 diabetes, OSA, hypertension, hyperlipidemia, who presents to the ED due to failure to thrive. Per patient's caretaker/HCPOA George [name]. Boneta Lucks states that since George [name] was admitted for pneumonia at the beginning of the month, he has never recovered. During this time, he is now completely wheelchair-bound and when he tries to walk, he has fallen numerous times. Over the last few days, his p.o. intake has decreased substantially and he only takes a few bites with every meal. His speech has worsened and become more incoherent. 11/12/23: "Irregular nodular density is seen in left upper lobe which was noted  on prior CT scan. Interval development of irregular density in right upper lobe which may represent focal inflammation. Follow up PA and lateral chest X-ray is recommended in 3-4 weeks following trial of antibiotic therapy to ensure resolution and exclude underlying  malignancy." Pt transitioned to comfort care, with plan to transition home with hospice. While in house on a puree solids  and thin liquids diet. Type of Study: Bedside Swallow Evaluation Previous Swallow Assessment: 06/10/2021- recommendation for regular solids and thin liquids Diet Prior to this Study: Dysphagia 1 (pureed);Thin liquids (Level 0) Temperature Spikes Noted: No (WBC 47.3) Respiratory Status: Nasal cannula (2L) History of Recent Intubation: No Behavior/Cognition: Lethargic/Drowsy Oral Cavity Assessment: Dry Oral Care Completed by SLP: Yes Oral Cavity - Dentition: Adequate natural dentition Vision:  (unable to assess) Self-Feeding Abilities: Total assist Patient Positioning: Upright in bed Baseline Vocal Quality: Not observed Volitional Cough: Cognitively unable to elicit Volitional Swallow: Unable to elicit    Oral/Motor/Sensory Function Overall Oral Motor/Sensory Function: Within functional limits (no overt impairment- though minimal volitional movement)   Ice Chips Ice chips: Impaired Presentation: Spoon Oral Phase Impairments: Poor awareness of bolus;Impaired mastication;Reduced labial seal;Reduced lingual movement/coordination Oral Phase Functional Implications:  (no volitional movement) Pharyngeal Phase Impairments: Suspected delayed Swallow;Decreased hyoid-laryngeal movement   Thin Liquid Thin Liquid: Not tested    Nectar Thick Nectar Thick Liquid: Not tested   Honey Thick Honey Thick Liquid: Not tested   Puree Puree: Not tested   Solid     Solid: Not tested     George Trung Wenzl Clapp  MS [facility]  SLP   George [name] 11/14/2023,1:25 PM

## 2023-11-14 NOTE — Progress Notes (Signed)
Progress Note   Patient: George [name] ZOX:[id number] DOB: 1931-12-29 DOA: 11/12/2023     1 DOS: the patient was seen and examined on 11/14/2023   Brief hospital course: Taken from H&P.  George [name] is a 89 y.o. male with medical history significant of CAD s/p PCI and DES to distal LAD (1999), severe COPD, type 2 diabetes, OSA, hypertension, hyperlipidemia, who presents to the ED due to failure to thrive.  Per patient's caretaker/HCPOA George [name]. George [name] states that since George [name] was admitted for pneumonia at the beginning of the month, he has never recovered. During this time, he is now completely wheelchair-bound and when he tries to walk, he has fallen numerous times. Over the last few days, his p.o. intake has decreased substantially and he only takes a few bites with every meal. His speech has worsened and become more incoherent.   On presentation patient has borderline soft blood pressure, afebrile Initial workup notable for WBC of 47.3, hemoglobin of 9.6, BUN 41, creatinine 1.68, alkaline phosphatase 691, AST 55, and GFR of 39. Lactic acid 2.6. Urinalysis with positive nitrites, and many bacteria. Chest x-ray with irregular nodule density in the LUL and irregular density in the RUL. Patient was started on IV fluids and Rocephin.   2/2: His HPOA would like to try a short course of antibiotics for concern of UTI.  Remained encephalopathic and does not appear safe to take p.o.  Swallow evaluation was also ordered.. Concern of underlying undetermined malignancy, she would like to take him back home with hospice care. Hospice care was consulted.  2/3: Remained encephalopathic and unable to take any p.o.  Approved for hospice, POA would like to take him back home tomorrow with hospice help after delivery of equipments.     Assessment and Plan: * Adult failure to thrive Per George [name], patient has rapidly declined since admitted with pneumonia several weeks ago and quality of life has  substantially diminished.  He is now no longer ambulatory and has poor p.o. intake.  She would like to trial a short course of IV fluids and antibiotics for possible cystitis, with ultimate plan of transitioning to hospice at home with her.  - George [name] consultation for hospice - Comfort measures  Goals of care, counseling/discussion Discussed with George [name], who states that patient has significantly declined.  She notes that several weeks ago, he was ambulatory, eating well and able to talk coherently although his memory has been impaired for quite a while now.  He is now nonambulatory and not wanting to eat.  She understands that the UTI may be contributing, however the ultimate underlying cause is suspected to be an undetermined malignancy, in light of severe leukocytosis and elevated alkaline phosphatase.  She would like to transition patient to comfort measures only.  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  - TOC consultation for hospice  Acute cystitis UA with positive nitrites and many bacteria.  Patient is unable to vocalize any symptoms.  - Trial of Rocephin x 3 days-will complete the course today  AKI (acute kidney injury) (HCC) In the setting of dehydration and poor p.o. intake.  - Trial of IV fluids per HCPOA wishes. Expressed low long-term utility if patient does not resume PO intake independently  Leukocytosis Severe leukocytosis, developed sometime in December 2024 with previous labs before.  He has seen hematology workup has been initiated.  Etiology thus far is uncertain as multiple  myeloma panel, flow cytometry, and BCR-ABL1 FISH have been negative. JAK testing pending still.   -No further monitoring of leukocytosis at this time given patient is transitioning to comfort and hospice  Dementia [facility]) History of longstanding dementia noted by PCP.    - Precautions  Type II diabetes mellitus with renal manifestations (HCC) - No  indication for SSI at this time  COPD (chronic obstructive pulmonary disease) (HCC) - Continue home bronchodilators for comfort - DuoNebs as needed for shortness of breath  CAD (coronary artery disease) - Discontinue home aspirin and statin  Essential hypertension - Discontinue home antihypertensives   Subjective: Patient remained quite encephalopathic and not following any commands.  Unable to participate with any care.  Physical Exam: Vitals:   11/13/23 1615 11/14/23 0000 11/14/23 0744 11/14/23 1154  BP: (!) 149/82  (!) 140/79   Pulse: (!) 113  91   Resp: 17  18   Temp: (!) 97.5 F (36.4 C)  98.7 F (37.1 C)   TempSrc:      SpO2: 99% 97% 97%   Weight:    65.1 kg  Height:    6' (1.829 m)   General.  Encephalopathic, frail and malnourished elderly man, in no acute distress. Pulmonary.  Lungs clear bilaterally, normal respiratory effort. CV.  Regular rate and rhythm, no JVD, rub or murmur. Abdomen.  Soft, nontender, nondistended, BS positive. CNS.  Encephalopathic, not following any commands Extremities.  No edema, no cyanosis, pulses intact and symmetrical.  Data Reviewed: Prior data reviewed  Family Communication: Talked with POA on phone.  Disposition: Status is: Inpatient.   inpatient because: Severity of illness  Planned Discharge Destination: Home with hospice tomorrow Time spent: 44 minutes  This record has been created using Conservation officer, historic buildings. Errors have been sought and corrected,but may not always be located. Such creation errors do not reflect on the standard of care.   Author: Arnetha Courser, MD 11/14/2023 1:56 PM  For on call review [email or web address].

## 2023-11-14 NOTE — Plan of Care (Signed)
  Problem: Coping: Goal: Level of anxiety will decrease Outcome: Progressing   Problem: Pain Managment: Goal: General experience of comfort will improve and/or be controlled Outcome: Progressing   Problem: Safety: Goal: Ability to remain free from injury will improve Outcome: Progressing   Problem: Skin Integrity: Goal: Risk for impaired skin integrity will decrease Outcome: Progressing

## 2023-11-14 NOTE — TOC Initial Note (Signed)
Transition of Care [facility]) - Initial/Assessment Note    Patient Details  Name: George [name] MRN: [id number] Date of Birth: [birth year]  Transition of Care [facility]) CM/SW Contact:    Allena Katz, LCSW Phone Number: 11/14/2023, 10:11 AM  Clinical Narrative:   Pt admitted from home POA denise states that she would like hospice services through [facility]. She works with hospice and is very Automotive engineer with the process. CSW made referral to [facility].                 Expected Discharge Plan: Home w Hospice Care Barriers to Discharge: Continued Medical Work up   Patient Goals and CMS Choice Patient states their goals for this hospitalization and ongoing recovery are:: return home with hospice CMS Medicare.gov Compare Post Acute Care list provided to:: Patient Represenative (must comment) Angelique Blonder POA) Choice offered to / list presented to : [facility] POA / Guardian      Expected Discharge Plan and Services                                              Prior Living Arrangements/Services   Lives with:: Self Patient language and need for interpreter reviewed:: Yes Do you feel safe going back to the place where you live?: Yes      Need for Family Participation in Patient Care: Yes (Comment) Care giver support system in place?: Yes (comment)      Activities of Daily Living   ADL Screening (condition at time of admission) Independently performs ADLs?: No Does the patient have a NEW difficulty with bathing/dressing/toileting/self-feeding that is expected to last >3 days?:  (Pt comfort care) Does the patient have a NEW difficulty with getting in/out of bed, walking, or climbing stairs that is expected to last >3 days?: No Is the patient deaf or have difficulty hearing?: Yes Does the patient have difficulty seeing, even when wearing glasses/contacts?:  (not known patient does not communicate clearly)  Permission Sought/Granted      Share Information with NAME: Dennard Nip           Emotional Assessment       Orientation: : Oriented to Self Alcohol / Substance Use: Never Used    Admission diagnosis:  Adult failure to thrive [R62.7] Acute cystitis without hematuria [N30.00] Altered mental status, unspecified altered mental status type [R41.82] Patient Active Problem List   Diagnosis Date Noted   Altered mental status 11/13/2023   Adult failure to thrive 11/12/2023   Goals of care, counseling/discussion 11/12/2023   Acute cystitis 11/12/2023   Leukocytosis 11/07/2023   Normocytic anemia 11/07/2023   Community acquired pneumonia 10/18/2023   Anxiety 10/18/2023   Dementia (HCC) 10/18/2023   Weakness 10/18/2023   Hearing loss 10/18/2023   DNR (do not resuscitate) 02/14/2023   Chronic respiratory failure with hypoxia (HCC) 01/25/2023   History of fracture of right fifth finger on 01/18/23 01/25/2023   OSA (obstructive sleep apnea) 01/25/2023   Acute on chronic respiratory failure with hypoxia (HCC) 06/08/2021   Gout 06/08/2021   CAP (community acquired pneumonia) 06/08/2021   Status post reverse arthroplasty of shoulder, left 02/10/2021   CKD stage 3b, GFR 30-44 ml/min (HCC) 11/04/2020   BPH with obstruction/lower urinary tract symptoms 11/04/2020   Foot pain 07/11/2018   Urinary urgency 06/27/2017   Erectile dysfunction 06/27/2017   Hypogonadism in male 06/27/2017  Tobacco abuse 02/09/2016   Essential hypertension 02/09/2016   GERD (gastroesophageal reflux disease) 02/09/2016   AKI (acute kidney injury) (HCC) 02/09/2016   Hyperlipidemia 04/22/2014   Hypertension 04/22/2014   COPD (chronic obstructive pulmonary disease) (HCC) 04/22/2014   Type II diabetes mellitus with renal manifestations (HCC) 04/22/2014   CAD (coronary artery disease) 09/10/1998   PCP:  [name], MD Pharmacy:   [facility] - [place], [place] - [address] [name] [address] Phone: [phone] Fax:  [phone]  [facility] [address] ([address]), [place] - [address] [place]) [place] [address] Phone: [phone] Fax: [phone]     Social Drivers of Health (SDOH) Social History: SDOH Screenings   Food Insecurity: Patient Unable To Answer (02/15/2023)  Housing: Low Risk  (01/26/2023)  Transportation Needs: No Transportation Needs (01/26/2023)  Utilities: Not At Risk (01/26/2023)  Tobacco Use: High Risk (11/12/2023)   SDOH Interventions:     Readmission Risk Interventions    01/28/2023    1:51 PM  Readmission Risk Prevention Plan  Transportation Screening Complete  HRI or Home Care Consult Complete  Palliative Care Screening Complete  Medication Review (RN Care Manager) Complete

## 2023-11-14 NOTE — Assessment & Plan Note (Signed)
Per Angelique Blonder, patient has rapidly declined since admitted with pneumonia several weeks ago and quality of life has substantially diminished.  He is now no longer ambulatory and has poor p.o. intake.  She would like to trial a short course of IV fluids and antibiotics for possible cystitis, with ultimate plan of transitioning to hospice at home with her.  - [facility] consultation for hospice - Comfort measures

## 2023-11-14 NOTE — Progress Notes (Signed)
[facility] LIAISON NOTE   Received request from Allena Katz, Transitions of Care Manager, for hospice services at home after discharge. Spoke with Tonita Phoenix HCPOA/POA to initiate education related to hospice philosophy, services, and team approach to care. Patient/family verbalized understanding of information given. Per discussion, the plan is for discharge home by EMS when medically cleared and DME has been delivered.  DME needs discussed.  Patient has the following equipment in the home:  Transport wheelchair, 2 wheeled walker, & shower bench. Patient/family requests the following equipment for delivery:  Hospital bed & over bed table  The address has been verified and is not correct in the chart.  DME to be delivered to:  [address] [place], [place] [address]- which is Tonita Phoenix home where patient has been living.  Tonita Phoenix is the family contact to arrange time of equipment delivery.   Please send signed and completed DNR home with patient/family if applicable.   Please provide prescriptions at discharge as needed to ensure ongoing symptom management.  [facility] information and contact numbers given to [employer].   Above information shared with Allena Katz, Transitions of Care Manager and hospital medical care team.   Please call with any hospice related questions or concerns. Thank you for the opportunity to participate in this patient's care.   Dolores Hoose, MA, BSN, RN, [facility] Liaison  [phone]

## 2023-11-15 DIAGNOSIS — N3000 Acute cystitis without hematuria: Secondary | ICD-10-CM | POA: Diagnosis not present

## 2023-11-15 DIAGNOSIS — Z7189 Other specified counseling: Secondary | ICD-10-CM | POA: Diagnosis not present

## 2023-11-15 DIAGNOSIS — F03C0 Unspecified dementia, severe, without behavioral disturbance, psychotic disturbance, mood disturbance, and anxiety: Secondary | ICD-10-CM | POA: Diagnosis not present

## 2023-11-15 DIAGNOSIS — R627 Adult failure to thrive: Secondary | ICD-10-CM | POA: Diagnosis not present

## 2023-11-15 DIAGNOSIS — R403 Persistent vegetative state: Secondary | ICD-10-CM

## 2023-11-15 LAB — CALR +MPL + E12-E15  (REFLEX)

## 2023-11-15 LAB — JAK2 V617F RFX CALR/MPL/E12-15

## 2023-11-15 MED ORDER — MORPHINE SULFATE (CONCENTRATE) 20 MG/ML PO SOLN: 10.0000 mg | ORAL | 0 refills | Status: DC | PRN

## 2023-11-15 MED ORDER — HALOPERIDOL 5 MG PO TABS: 5.0000 mg | ORAL_TABLET | ORAL | 0 refills | Status: DC | PRN

## 2023-11-15 MED ORDER — PROCHLORPERAZINE 25 MG RE SUPP: 25.0000 mg | RECTAL | 0 refills | Status: DC | PRN

## 2023-11-15 MED ORDER — LORAZEPAM 2 MG/ML PO CONC: 0.6000 mg | ORAL | 0 refills | Status: DC | PRN

## 2023-11-15 MED ORDER — HYOSCYAMINE SULFATE 0.125 MG SL SUBL: 0.1250 mg | SUBLINGUAL_TABLET | SUBLINGUAL | 0 refills | Status: DC | PRN

## 2023-11-15 MED ORDER — ACETAMINOPHEN 650 MG RE SUPP: 650.0000 mg | Freq: Four times a day (QID) | RECTAL | 0 refills | Status: DC | PRN

## 2023-11-15 NOTE — Progress Notes (Signed)
Patient discharged to home in stable condition via EMS.  Patient medicated with IV morphine and Ativan prior to discharge in preparation for EMS trip home.

## 2023-11-17 LAB — CULTURE, BLOOD (ROUTINE X 2)
Culture: NO GROWTH
Culture: NO GROWTH

## 2023-11-18 ENCOUNTER — Inpatient Hospital Stay: Payer: Medicare Other | Attending: Internal Medicine | Admitting: Internal Medicine

## 2023-12-10 NOTE — TOC Transition Note (Signed)
 Transition of Care [facility]) - Discharge Note   Patient Details  Name: George [name] MRN: [id number] Date of Birth: Jul 03, 1932  Transition [facility] Care [facility]) CM/SW Contact:  Ladene Lady, LCSW Phone Number: 11-17-23, 11:43 AM   Clinical Narrative:   CSW spoke with denise who states dme has been delivered. [facility] has been notified. [name] notified. CSW to call [facility].    Final next level of care: Home w Hospice Care Barriers to Discharge: Barriers Resolved   Patient Goals and CMS Choice Patient states their goals for this hospitalization and ongoing recovery are:: go home with CMS Medicare.gov Compare Post Acute Care list provided to:: Patient Choice offered to / list presented to : [facility] POA / Guardian      Discharge Placement                    Patient and family notified of of transfer: November 17, 2023  Discharge Plan and Services Additional resources added to the After Visit Summary for                                       Social Drivers of Health (SDOH) Interventions SDOH Screenings   Food Insecurity: Patient Unable To Answer (11-17-2023)  Housing: Patient Unable To Answer (2023/11/17)  Transportation Needs: Patient Unable To Answer (Nov 17, 2023)  Utilities: Patient Unable To Answer (11/17/23)  Social Connections: Unknown (11-17-23)  Tobacco Use: High Risk (11/12/2023)     Readmission Risk Interventions    01/28/2023    1:51 PM  Readmission Risk Prevention Plan  Transportation Screening Complete  HRI or Home Care Consult Complete  Palliative Care Screening Complete  Medication Review (RN Care Manager) Complete

## 2023-12-10 NOTE — Discharge Summary (Signed)
 Physician Discharge Summary   Patient: George [name] MRN: [id number] DOB: Jan 16, 1932  Admit date:     11/12/2023  Discharge date: 12/10/23  Discharge Physician: Amaryllis Dare   PCP: Glover Lenis, MD   Recommendations at discharge:  Patient is being discharged home with hospice.  Discharge Diagnoses: Principal Problem:   Adult failure to thrive Active Problems:   Goals of care, counseling/discussion   Acute cystitis   AKI (acute kidney injury) (HCC)   Leukocytosis   Dementia (HCC)   Essential hypertension   CAD (coronary artery disease)   COPD (chronic obstructive pulmonary disease) (HCC)   Type II diabetes mellitus with renal manifestations (HCC)   Altered mental status  Resolved Problems:   * No resolved hospital problems. [facility] Course: Taken from H&P.  KINSER FELLMAN is a 89 y.o. male with medical history significant of CAD s/p PCI and DES to distal LAD (1999), severe COPD, type 2 diabetes, OSA, hypertension, hyperlipidemia, who presents to the ED due to failure to thrive.  Per patient's caretaker/HCPOA George [name]. Randall states that since Mr. Mullens was admitted for pneumonia at the beginning of the month, he has never recovered. During this time, he is now completely wheelchair-bound and when he tries to walk, he has fallen numerous times. Over the last few days, his p.o. intake has decreased substantially and he only takes a few bites with every meal. His speech has worsened and become more incoherent.   On presentation patient has borderline soft blood pressure, afebrile Initial workup notable for WBC of 47.3, hemoglobin of 9.6, BUN 41, creatinine 1.68, alkaline phosphatase 691, AST 55, and GFR of 39. Lactic acid 2.6. Urinalysis with positive nitrites, and many bacteria. Chest x-ray with irregular nodule density in the LUL and irregular density in the RUL. Patient was started on IV fluids and Rocephin .   2/2: His HPOA would like to try a short course of  antibiotics for concern of UTI.  Remained encephalopathic and does not appear safe to take p.o.  Swallow evaluation was also ordered.. Concern of underlying undetermined malignancy, she would like to take him back home with hospice care. Hospice care was consulted.  2/3: Remained encephalopathic and unable to take any p.o.  Approved for hospice, POA would like to take him back home tomorrow with hospice help after delivery of equipments.  2/4: Remained encephalopathic, received a dose of Ativan  and morphine  earlier in the morning as he was seen being very uncomfortable.  Patient is unable to take anything by mouth.  POA already transitioned him to full comfort care.  Very poor prognosis at this time.  Patient is being discharged home with hospice help for end-of-life care.      Assessment and Plan: * Adult failure to thrive Per George, patient has rapidly declined since admitted with pneumonia several weeks ago and quality of life has substantially diminished.  He is now no longer ambulatory and has poor p.o. intake.  She would like to trial a short course of IV fluids and antibiotics for possible cystitis, with ultimate plan of transitioning to hospice at home with her.  - [facility] consultation for hospice - Comfort measures  Goals of care, counseling/discussion Discussed with George, who states that patient has significantly declined.  She notes that several weeks ago, he was ambulatory, eating well and able to talk coherently although his memory has been impaired for quite a while now.  He is now nonambulatory and not wanting to eat.  She  understands that the UTI may be contributing, however the ultimate underlying cause is suspected to be an undetermined malignancy, in light of severe leukocytosis and elevated alkaline phosphatase.  She would like to transition patient to comfort measures only.  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  - TOC  consultation for hospice  Acute cystitis UA with positive nitrites and many bacteria.  Patient is unable to vocalize any symptoms.  - Trial of Rocephin  x 3 days-will complete the course today  AKI (acute kidney injury) (HCC) In the setting of dehydration and poor p.o. intake.  - Trial of IV fluids per HCPOA wishes. Expressed low long-term utility if patient does not resume PO intake independently  Leukocytosis Severe leukocytosis, developed sometime in December 2024 with previous labs before.  He has seen hematology workup has been initiated.  Etiology thus far is uncertain as multiple myeloma panel, flow cytometry, and BCR-ABL1 FISH have been negative. JAK testing pending still.   -No further monitoring of leukocytosis at this time given patient is transitioning to comfort and hospice  Dementia [facility]) History of longstanding dementia noted by PCP.    - Precautions  Type II diabetes mellitus with renal manifestations (HCC) - No indication for SSI at this time  COPD (chronic obstructive pulmonary disease) (HCC) - Continue home bronchodilators for comfort - DuoNebs as needed for shortness of breath  CAD (coronary artery disease) - Discontinue home aspirin  and statin  Essential hypertension - Discontinue home antihypertensives   Consultants: Hospice Procedures performed: None Disposition: Hospice care Diet recommendation:  Discharge Diet Orders (From admission, onward)     Start     Ordered   09-Dec-2023 0000  Diet - low sodium heart healthy        2023-12-09 1045           Patient can eat as comfort measures if he is able to DISCHARGE MEDICATION: Allergies as of 09-Dec-2023   No Known Allergies      Medication List     STOP taking these medications    aspirin  EC 81 MG tablet   simvastatin  20 MG tablet Commonly known as: ZOCOR    verapamil  240 MG CR tablet Commonly known as: CALAN -SR       TAKE these medications    acetaminophen  650 MG  suppository Commonly known as: TYLENOL  Place 1 suppository (650 mg total) rectally every 6 (six) hours as needed for mild pain (pain score 1-3) (or Fever >/= 101).   albuterol  108 (90 Base) MCG/ACT inhaler Commonly known as: VENTOLIN  HFA Inhale 1-2 puffs into the lungs every 6 (six) hours as needed for wheezing or shortness of breath.   albuterol  (2.5 MG/3ML) 0.083% nebulizer solution Commonly known as: PROVENTIL  Take 2.5 mg by nebulization every 6 (six) hours as needed for wheezing.   haloperidol  5 MG tablet Commonly known as: HALDOL  Take 1 tablet (5 mg total) by mouth every 4 (four) hours as needed for agitation. May crush, mix with water and give sublingually if needed.   hyoscyamine  0.125 MG SL tablet Commonly known as: LEVSIN  SL Place 1 tablet (0.125 mg total) under the tongue every 4 (four) hours as needed (excess oral secretions).   LORazepam  2 MG/ML concentrated solution Commonly known as: ATIVAN  Place 0.3 mLs (0.6 mg total) under the tongue every 4 (four) hours as needed for anxiety.   morphine  20 MG/ML concentrated solution Commonly known as: ROXANOL Take 0.5 mLs (10 mg  total) by mouth every 4 (four) hours as needed for severe pain (pain score 7-10). May give sublingually if needed.   omeprazole 20 MG capsule Commonly known as: PRILOSEC Take 20 mg by mouth every morning.   prochlorperazine  25 MG suppository Commonly known as: COMPAZINE  Place 1 suppository (25 mg total) rectally every 4 (four) hours as needed for nausea or vomiting.   Trelegy Ellipta 100-62.5-25 MCG/INH Aepb Generic drug: Fluticasone -Umeclidin-Vilant Inhale 1 puff into the lungs every morning.        Discharge Exam: Filed Weights   11/14/23 1154  Weight: 65.1 kg   General.  Frail, malnourished and sedated elderly man, Pulmonary.  Lungs clear bilaterally, normal respiratory effort. CV.  Regular rate and rhythm, no JVD, rub or murmur. Abdomen.  Soft, nontender, nondistended, BS  positive. CNS.  Sedated after receiving morphine  and Ativan , Extremities.  No edema, no cyanosis, pulses intact and symmetrical.  Condition at discharge: poor  The results of significant diagnostics from this hospitalization (including imaging, microbiology, ancillary and laboratory) are listed below for reference.   Imaging Studies: DG Chest Port 1 View Result Date: 11/12/2023 CLINICAL DATA:  Sepsis. EXAM: PORTABLE CHEST 1 VIEW COMPARISON:  October 22, 2023.  May 25, 2023. FINDINGS: The heart size and mediastinal contours are within normal limits. Status post left shoulder arthroplasty. Irregular nodular density seen in left upper lobe as described on prior CT scan. Interval development of irregular density in right upper lobe which may represent focal inflammation. Minimal right basilar subsegmental atelectasis or scarring is noted. IMPRESSION: Irregular nodular density is seen in left upper lobe which was noted on prior CT scan. Interval development of irregular density in right upper lobe which may represent focal inflammation. Followup PA and lateral chest X-ray is recommended in 3-4 weeks following trial of antibiotic therapy to ensure resolution and exclude underlying malignancy. Electronically Signed   By: Lynwood Landy Raddle M.D.   On: 11/12/2023 13:29   DG Chest 2 View Result Date: 10/22/2023 CLINICAL DATA:  Shortness of breath.  Cough. EXAM: CHEST - 2 VIEW COMPARISON:  10/18/2023 FINDINGS: Heart size and pulmonary vascularity are normal. Calcification of the aorta. Mediastinal contours appear intact. Emphysematous changes in the lungs. Small bilateral pleural effusions. Linear scarring and bronchiectasis in the lungs consistent with chronic lung disease. Patchy infiltrates in the lung bases may represent superimposed pneumonia. Similar appearance to previous study. Degenerative changes in the spine and right shoulder. Postoperative change in the left shoulder. IMPRESSION: 1. Emphysematous  changes and chronic scarring in the lungs. 2. Superimposed patchy infiltration in the lung bases, possibly pneumonia. 3. Small bilateral pleural effusions. Electronically Signed   By: Elsie Gravely M.D.   On: 10/22/2023 12:23   DG Chest 2 View Result Date: 10/18/2023 CLINICAL DATA:  89 year old male with cough and leukocytosis. EXAM: CHEST - 2 VIEW COMPARISON:  Chest radiographs 10/16/2023 and earlier. FINDINGS: Seated AP and lateral views of chest 1148 hours. Underlying emphysema demonstrated by CT in August. Left upper lung architectural distortion appears grossly stable since that time. Patchy bilateral lower lung opacity persists, more confluent on the right. Some of this appears to be middle lobe involvement on the lateral. No pleural effusion. Mediastinal contours remain within normal limits. No pulmonary edema or pneumothorax. Left shoulder arthroplasty. No acute osseous abnormality identified. Negative visible bowel gas. IMPRESSION: Chronic lung disease with Emphysema (ICD10-J43.9). Patchy opacity now in both lower lungs suspicious for acute infectious exacerbation. No pleural effusion. Electronically Signed   By: VEAR  Shona M.D.   On: 10/18/2023 12:08   DG Chest 2 View Result Date: 10/16/2023 CLINICAL DATA:  Cough EXAM: CHEST - 2 VIEW COMPARISON:  X-ray 02/14/2023.  CT chest 05/25/2023. FINDINGS: No pneumothorax or effusion. Subtle opacity in the left lung base. Acute infiltrates possible. There is persistent nodular area in the left upper lobe as seen on prior CT scan. No pneumothorax or effusion. Normal cardiopericardial silhouette. Degenerative changes of the spine. Calcified aorta. Left shoulder arthroplasty. IMPRESSION: Subtle opacity in the left lung base. Possible infiltrate. Recommend follow-up. Persistent nodular areas in the left upper lobe, similar to the prior CT scan. Electronically Signed   By: Ranell Bring M.D.   On: 10/16/2023 11:44    Microbiology: Results for orders placed or  performed during the hospital encounter of 11/12/23  Blood Culture (routine x 2)     Status: None (Preliminary result)   Collection Time: 11/12/23  1:28 PM   Specimen: BLOOD  Result Value Ref Range Status   Specimen Description BLOOD LEFT ANTECUBITAL  Final   Special Requests   Final    BOTTLES DRAWN AEROBIC AND ANAEROBIC Blood Culture results may not be optimal due to an inadequate volume of blood received in culture bottles   Culture   Final    NO GROWTH 3 DAYS Performed at [facility], [address], [place], [place] [address]    Report Status PENDING  Incomplete  Blood Culture (routine x 2)     Status: None (Preliminary result)   Collection Time: 11/12/23  1:30 PM   Specimen: BLOOD  Result Value Ref Range Status   Specimen Description BLOOD RIGHT ANTECUBITAL  Final   Special Requests   Final    BOTTLES DRAWN AEROBIC AND ANAEROBIC Blood Culture results may not be optimal due to an inadequate volume of blood received in culture bottles   Culture   Final    NO GROWTH 3 DAYS Performed at [facility], [address], [place], [place] [address]    Report Status PENDING  Incomplete  Resp panel by RT-PCR (RSV, Flu A&B, Covid) Urine, Clean Catch     Status: None   Collection Time: 11/12/23  1:30 PM   Specimen: Urine, Clean Catch; Nasal Swab  Result Value Ref Range Status   SARS Coronavirus 2 by RT PCR NEGATIVE NEGATIVE Final    Comment: (NOTE) SARS-CoV-2 target nucleic acids are NOT DETECTED.  The SARS-CoV-2 RNA is generally detectable in upper respiratory specimens during the acute phase of infection. The lowest concentration of SARS-CoV-2 viral copies this assay can detect is 138 copies/mL. A negative result does not preclude SARS-Cov-2 infection and should not be used as the sole basis for treatment or other patient management decisions. A negative result may occur with  improper specimen collection/handling, submission of specimen other than  nasopharyngeal swab, presence of viral mutation(s) within the areas targeted by this assay, and inadequate number of viral copies(<138 copies/mL). A negative result must be combined with clinical observations, patient history, and epidemiological information. The expected result is Negative.  Fact Sheet for Patients:  bloggercourse.com  Fact Sheet for Healthcare Providers:  seriousbroker.it  This test is no t yet approved or cleared by the [place]  FDA and  has been authorized for detection and/or diagnosis of SARS-CoV-2 by FDA under an Emergency Use Authorization (EUA). This EUA will remain  in effect (meaning this test can be used) for the duration of the COVID-19 declaration under Section 564(b)(1) of the Act,  21 U.S.C.section 360bbb-3(b)(1), unless the authorization is terminated  or revoked sooner.       Influenza A by PCR NEGATIVE NEGATIVE Final   Influenza B by PCR NEGATIVE NEGATIVE Final    Comment: (NOTE) The Xpert Xpress SARS-CoV-2/FLU/RSV plus assay is intended as an aid in the diagnosis of influenza from Nasopharyngeal swab specimens and should not be used as a sole basis for treatment. Nasal washings and aspirates are unacceptable for Xpert Xpress SARS-CoV-2/FLU/RSV testing.  Fact Sheet for Patients: bloggercourse.com  Fact Sheet for Healthcare Providers: seriousbroker.it  This test is not yet approved or cleared by the [place]  FDA and has been authorized for detection and/or diagnosis of SARS-CoV-2 by FDA under an Emergency Use Authorization (EUA). This EUA will remain in effect (meaning this test can be used) for the duration of the COVID-19 declaration under Section 564(b)(1) of the Act, 21 U.S.C. section 360bbb-3(b)(1), unless the authorization is terminated or revoked.     Resp Syncytial Virus by PCR NEGATIVE NEGATIVE Final    Comment:  (NOTE) Fact Sheet for Patients: bloggercourse.com  Fact Sheet for Healthcare Providers: seriousbroker.it  This test is not yet approved or cleared by the [place]  FDA and has been authorized for detection and/or diagnosis of SARS-CoV-2 by FDA under an Emergency Use Authorization (EUA). This EUA will remain in effect (meaning this test can be used) for the duration of the COVID-19 declaration under Section 564(b)(1) of the Act, 21 U.S.C. section 360bbb-3(b)(1), unless the authorization is terminated or revoked.  Performed at [facility], [address] [place], [place] [address]     Labs: CBC: Recent Labs  Lab 11/12/23 1333  WBC 47.3*  NEUTROABS 43.2*  HGB 9.6*  HCT 30.3*  MCV 92.7  PLT 305   Basic Metabolic Panel: Recent Labs  Lab 11/12/23 1333  NA 142  K 4.5  CL 108  CO2 24  GLUCOSE 117*  BUN 41*  CREATININE 1.68*  CALCIUM  8.7*   Liver Function Tests: Recent Labs  Lab 11/12/23 1333  AST 55*  ALT 32  ALKPHOS 691*  BILITOT 1.4*  PROT 4.8*  ALBUMIN 2.0*   CBG: No results for input(s): GLUCAP in the last 168 hours.  Discharge time spent: greater than 30 minutes.  This record has been created using Conservation officer, historic buildings. Errors have been sought and corrected,but may not always be located. Such creation errors do not reflect on the standard of care.   Signed: Amaryllis Dare, MD [facility] Nov 17, 2023

## 2023-12-10 NOTE — Plan of Care (Signed)
  Problem: Coping: Goal: Level of anxiety will decrease Outcome: Progressing   Problem: Elimination: Goal: Will not experience complications related to urinary retention Outcome: Progressing   Problem: Safety: Goal: Ability to remain free from injury will improve Outcome: Progressing   Problem: Skin Integrity: Goal: Risk for impaired skin integrity will decrease Outcome: Progressing   

## 2023-12-10 DEATH — deceased
